# Patient Record
Sex: Female | Born: 1960
Health system: Southern US, Community
[De-identification: ages and names within clinical notes are randomized; demographics above are authoritative.]

## PROBLEM LIST (undated history)

## (undated) ENCOUNTER — Emergency Department (HOSPITAL_COMMUNITY): Admission: EM | Payer: Medicaid Other

## (undated) DIAGNOSIS — F329 Major depressive disorder, single episode, unspecified: Secondary | ICD-10-CM

## (undated) DIAGNOSIS — R011 Cardiac murmur, unspecified: Secondary | ICD-10-CM

## (undated) DIAGNOSIS — J449 Chronic obstructive pulmonary disease, unspecified: Secondary | ICD-10-CM

## (undated) DIAGNOSIS — R51 Headache: Secondary | ICD-10-CM

## (undated) DIAGNOSIS — G43909 Migraine, unspecified, not intractable, without status migrainosus: Secondary | ICD-10-CM

## (undated) DIAGNOSIS — R55 Syncope and collapse: Secondary | ICD-10-CM

## (undated) DIAGNOSIS — N200 Calculus of kidney: Secondary | ICD-10-CM

## (undated) DIAGNOSIS — R413 Other amnesia: Secondary | ICD-10-CM

## (undated) DIAGNOSIS — J069 Acute upper respiratory infection, unspecified: Secondary | ICD-10-CM

## (undated) DIAGNOSIS — U071 COVID-19: Secondary | ICD-10-CM

## (undated) DIAGNOSIS — K759 Inflammatory liver disease, unspecified: Secondary | ICD-10-CM

## (undated) DIAGNOSIS — IMO0002 Reserved for concepts with insufficient information to code with codable children: Secondary | ICD-10-CM

## (undated) DIAGNOSIS — M797 Fibromyalgia: Secondary | ICD-10-CM

## (undated) DIAGNOSIS — T8859XA Other complications of anesthesia, initial encounter: Secondary | ICD-10-CM

## (undated) DIAGNOSIS — M1990 Unspecified osteoarthritis, unspecified site: Secondary | ICD-10-CM

## (undated) DIAGNOSIS — D649 Anemia, unspecified: Secondary | ICD-10-CM

## (undated) DIAGNOSIS — K219 Gastro-esophageal reflux disease without esophagitis: Secondary | ICD-10-CM

## (undated) DIAGNOSIS — F32A Depression, unspecified: Secondary | ICD-10-CM

## (undated) DIAGNOSIS — I10 Essential (primary) hypertension: Secondary | ICD-10-CM

## (undated) DIAGNOSIS — I959 Hypotension, unspecified: Secondary | ICD-10-CM

## (undated) DIAGNOSIS — R0602 Shortness of breath: Secondary | ICD-10-CM

## (undated) DIAGNOSIS — K589 Irritable bowel syndrome without diarrhea: Secondary | ICD-10-CM

## (undated) DIAGNOSIS — G5600 Carpal tunnel syndrome, unspecified upper limb: Secondary | ICD-10-CM

## (undated) DIAGNOSIS — T4145XA Adverse effect of unspecified anesthetic, initial encounter: Secondary | ICD-10-CM

## (undated) HISTORY — DX: Other amnesia: R41.3

## (undated) HISTORY — PX: DILATION AND CURETTAGE OF UTERUS: SHX78

## (undated) HISTORY — PX: OTHER SURGICAL HISTORY: SHX169

## (undated) HISTORY — PX: CHOLECYSTECTOMY: SHX55

## (undated) HISTORY — DX: Syncope and collapse: R55

## (undated) HISTORY — PX: LIVER BIOPSY: SHX301

## (undated) HISTORY — DX: Anemia, unspecified: D64.9

## (undated) HISTORY — DX: Gastro-esophageal reflux disease without esophagitis: K21.9

## (undated) HISTORY — PX: TONSILLECTOMY: SUR1361

## (undated) HISTORY — PX: TUBAL LIGATION: SHX77

---
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
## 1998-03-22 ENCOUNTER — Emergency Department (HOSPITAL_COMMUNITY): Admission: EM | Admit: 1998-03-22 | Discharge: 1998-03-22 | Payer: Self-pay | Admitting: Emergency Medicine

## 1998-06-15 ENCOUNTER — Inpatient Hospital Stay (HOSPITAL_COMMUNITY): Admission: AD | Admit: 1998-06-15 | Discharge: 1998-06-15 | Payer: Self-pay | Admitting: Obstetrics

## 1998-07-12 ENCOUNTER — Ambulatory Visit (HOSPITAL_COMMUNITY): Admission: RE | Admit: 1998-07-12 | Discharge: 1998-07-12 | Payer: Self-pay | Admitting: Family Medicine

## 1998-07-12 ENCOUNTER — Encounter: Payer: Self-pay | Admitting: Family Medicine

## 1998-08-18 ENCOUNTER — Encounter: Payer: Self-pay | Admitting: Emergency Medicine

## 1998-08-18 ENCOUNTER — Emergency Department (HOSPITAL_COMMUNITY): Admission: EM | Admit: 1998-08-18 | Discharge: 1998-08-18 | Payer: Self-pay | Admitting: Emergency Medicine

## 1999-01-27 ENCOUNTER — Inpatient Hospital Stay (HOSPITAL_COMMUNITY): Admission: AD | Admit: 1999-01-27 | Discharge: 1999-01-28 | Payer: Self-pay | Admitting: Obstetrics

## 1999-01-27 ENCOUNTER — Encounter: Payer: Self-pay | Admitting: Emergency Medicine

## 1999-02-21 ENCOUNTER — Ambulatory Visit (HOSPITAL_COMMUNITY): Admission: RE | Admit: 1999-02-21 | Discharge: 1999-02-21 | Payer: Self-pay | Admitting: *Deleted

## 1999-02-28 ENCOUNTER — Encounter: Admission: RE | Admit: 1999-02-28 | Discharge: 1999-05-29 | Payer: Self-pay | Admitting: Obstetrics

## 1999-03-06 ENCOUNTER — Encounter (HOSPITAL_COMMUNITY): Admission: RE | Admit: 1999-03-06 | Discharge: 1999-03-21 | Payer: Self-pay | Admitting: Obstetrics

## 1999-03-06 ENCOUNTER — Encounter: Admission: RE | Admit: 1999-03-06 | Discharge: 1999-03-06 | Payer: Self-pay | Admitting: Obstetrics

## 1999-03-19 ENCOUNTER — Inpatient Hospital Stay (HOSPITAL_COMMUNITY): Admission: AD | Admit: 1999-03-19 | Discharge: 1999-03-19 | Payer: Self-pay | Admitting: *Deleted

## 1999-03-20 ENCOUNTER — Inpatient Hospital Stay (HOSPITAL_COMMUNITY): Admission: AD | Admit: 1999-03-20 | Discharge: 1999-03-22 | Payer: Self-pay | Admitting: Obstetrics & Gynecology

## 1999-03-26 ENCOUNTER — Inpatient Hospital Stay (HOSPITAL_COMMUNITY): Admission: AD | Admit: 1999-03-26 | Discharge: 1999-03-26 | Payer: Self-pay | Admitting: Obstetrics

## 1999-11-01 ENCOUNTER — Emergency Department (HOSPITAL_COMMUNITY): Admission: EM | Admit: 1999-11-01 | Discharge: 1999-11-01 | Payer: Self-pay | Admitting: Emergency Medicine

## 1999-11-01 ENCOUNTER — Encounter: Payer: Self-pay | Admitting: Emergency Medicine

## 2000-11-14 ENCOUNTER — Observation Stay (HOSPITAL_COMMUNITY): Admission: AD | Admit: 2000-11-14 | Discharge: 2000-11-15 | Payer: Self-pay | Admitting: Obstetrics & Gynecology

## 2000-11-14 ENCOUNTER — Encounter: Payer: Self-pay | Admitting: Obstetrics & Gynecology

## 2000-12-07 ENCOUNTER — Other Ambulatory Visit: Admission: RE | Admit: 2000-12-07 | Discharge: 2000-12-07 | Payer: Self-pay | Admitting: Obstetrics

## 2000-12-12 ENCOUNTER — Emergency Department (HOSPITAL_COMMUNITY): Admission: EM | Admit: 2000-12-12 | Discharge: 2000-12-13 | Payer: Self-pay | Admitting: *Deleted

## 2000-12-16 ENCOUNTER — Inpatient Hospital Stay: Admission: AD | Admit: 2000-12-16 | Discharge: 2000-12-16 | Payer: Self-pay | Admitting: Obstetrics

## 2001-01-05 ENCOUNTER — Encounter: Admission: AD | Admit: 2001-01-05 | Discharge: 2001-02-04 | Payer: Self-pay | Admitting: Obstetrics

## 2001-01-09 ENCOUNTER — Inpatient Hospital Stay (HOSPITAL_COMMUNITY): Admission: AD | Admit: 2001-01-09 | Discharge: 2001-01-09 | Payer: Self-pay | Admitting: Obstetrics

## 2001-01-20 ENCOUNTER — Encounter: Payer: Self-pay | Admitting: Obstetrics

## 2001-01-25 ENCOUNTER — Emergency Department (HOSPITAL_COMMUNITY): Admission: EM | Admit: 2001-01-25 | Discharge: 2001-01-25 | Payer: Self-pay | Admitting: Emergency Medicine

## 2001-02-04 ENCOUNTER — Inpatient Hospital Stay (HOSPITAL_COMMUNITY): Admission: AD | Admit: 2001-02-04 | Discharge: 2001-02-04 | Payer: Self-pay | Admitting: Obstetrics

## 2001-02-09 ENCOUNTER — Inpatient Hospital Stay (HOSPITAL_COMMUNITY): Admission: AD | Admit: 2001-02-09 | Discharge: 2001-02-09 | Payer: Self-pay | Admitting: Obstetrics

## 2001-02-15 ENCOUNTER — Inpatient Hospital Stay (HOSPITAL_COMMUNITY): Admission: AD | Admit: 2001-02-15 | Discharge: 2001-02-15 | Payer: Self-pay | Admitting: Obstetrics

## 2002-04-04 ENCOUNTER — Emergency Department (HOSPITAL_COMMUNITY): Admission: EM | Admit: 2002-04-04 | Discharge: 2002-04-04 | Payer: Self-pay | Admitting: Emergency Medicine

## 2002-09-28 ENCOUNTER — Ambulatory Visit (HOSPITAL_COMMUNITY): Admission: RE | Admit: 2002-09-28 | Discharge: 2002-09-28 | Payer: Self-pay | Admitting: *Deleted

## 2002-09-28 ENCOUNTER — Encounter: Payer: Self-pay | Admitting: *Deleted

## 2002-09-28 ENCOUNTER — Encounter (INDEPENDENT_AMBULATORY_CARE_PROVIDER_SITE_OTHER): Payer: Self-pay | Admitting: Specialist

## 2003-03-24 ENCOUNTER — Emergency Department (HOSPITAL_COMMUNITY): Admission: EM | Admit: 2003-03-24 | Discharge: 2003-03-24 | Payer: Self-pay | Admitting: *Deleted

## 2003-03-25 ENCOUNTER — Emergency Department (HOSPITAL_COMMUNITY): Admission: EM | Admit: 2003-03-25 | Discharge: 2003-03-25 | Payer: Self-pay | Admitting: Internal Medicine

## 2003-11-18 ENCOUNTER — Emergency Department (HOSPITAL_COMMUNITY): Admission: EM | Admit: 2003-11-18 | Discharge: 2003-11-18 | Payer: Self-pay | Admitting: Emergency Medicine

## 2004-12-04 ENCOUNTER — Ambulatory Visit: Payer: Self-pay | Admitting: Family Medicine

## 2005-01-13 ENCOUNTER — Ambulatory Visit: Payer: Self-pay | Admitting: Internal Medicine

## 2005-03-27 ENCOUNTER — Encounter (INDEPENDENT_AMBULATORY_CARE_PROVIDER_SITE_OTHER): Payer: Self-pay | Admitting: Specialist

## 2005-03-27 ENCOUNTER — Ambulatory Visit (HOSPITAL_COMMUNITY): Admission: RE | Admit: 2005-03-27 | Discharge: 2005-03-27 | Payer: Self-pay | Admitting: Gastroenterology

## 2005-04-09 ENCOUNTER — Ambulatory Visit: Payer: Self-pay | Admitting: Gastroenterology

## 2005-05-14 ENCOUNTER — Ambulatory Visit: Payer: Self-pay | Admitting: Gastroenterology

## 2005-07-14 ENCOUNTER — Encounter: Admission: RE | Admit: 2005-07-14 | Discharge: 2005-07-14 | Payer: Self-pay | Admitting: Orthopaedic Surgery

## 2005-07-15 ENCOUNTER — Ambulatory Visit: Payer: Self-pay | Admitting: Gastroenterology

## 2005-08-28 ENCOUNTER — Ambulatory Visit: Payer: Self-pay | Admitting: Gastroenterology

## 2005-10-15 ENCOUNTER — Ambulatory Visit: Payer: Self-pay | Admitting: Gastroenterology

## 2005-10-20 ENCOUNTER — Ambulatory Visit: Payer: Self-pay | Admitting: Gastroenterology

## 2005-10-20 ENCOUNTER — Encounter (INDEPENDENT_AMBULATORY_CARE_PROVIDER_SITE_OTHER): Payer: Self-pay | Admitting: *Deleted

## 2005-11-05 ENCOUNTER — Ambulatory Visit: Payer: Self-pay | Admitting: Gastroenterology

## 2005-11-06 ENCOUNTER — Ambulatory Visit: Payer: Self-pay | Admitting: Pulmonary Disease

## 2007-08-02 DIAGNOSIS — R0989 Other specified symptoms and signs involving the circulatory and respiratory systems: Secondary | ICD-10-CM | POA: Insufficient documentation

## 2007-08-02 DIAGNOSIS — B1920 Unspecified viral hepatitis C without hepatic coma: Secondary | ICD-10-CM | POA: Insufficient documentation

## 2007-08-02 DIAGNOSIS — R0609 Other forms of dyspnea: Secondary | ICD-10-CM | POA: Insufficient documentation

## 2007-08-02 DIAGNOSIS — K589 Irritable bowel syndrome without diarrhea: Secondary | ICD-10-CM | POA: Insufficient documentation

## 2008-05-29 ENCOUNTER — Other Ambulatory Visit: Payer: Self-pay

## 2008-05-30 ENCOUNTER — Ambulatory Visit: Payer: Self-pay | Admitting: *Deleted

## 2008-05-30 ENCOUNTER — Inpatient Hospital Stay (HOSPITAL_COMMUNITY): Admission: EM | Admit: 2008-05-30 | Discharge: 2008-06-03 | Payer: Self-pay | Admitting: *Deleted

## 2008-10-11 ENCOUNTER — Ambulatory Visit (HOSPITAL_COMMUNITY): Admission: RE | Admit: 2008-10-11 | Discharge: 2008-10-11 | Payer: Self-pay | Admitting: Specialist

## 2008-11-23 ENCOUNTER — Encounter: Admission: RE | Admit: 2008-11-23 | Discharge: 2008-11-23 | Payer: Self-pay | Admitting: Specialist

## 2008-12-12 ENCOUNTER — Emergency Department (HOSPITAL_COMMUNITY): Admission: EM | Admit: 2008-12-12 | Discharge: 2008-12-13 | Payer: Self-pay | Admitting: Emergency Medicine

## 2010-08-28 ENCOUNTER — Emergency Department (HOSPITAL_COMMUNITY): Admission: EM | Admit: 2010-08-28 | Discharge: 2009-10-19 | Payer: Self-pay | Admitting: Emergency Medicine

## 2010-10-12 ENCOUNTER — Encounter: Payer: Self-pay | Admitting: Internal Medicine

## 2010-10-12 ENCOUNTER — Encounter: Payer: Self-pay | Admitting: Specialist

## 2010-12-07 LAB — CBC
MCHC: 33.1 g/dL (ref 30.0–36.0)
MCV: 96.4 fL (ref 78.0–100.0)
RDW: 13.3 % (ref 11.5–15.5)

## 2010-12-07 LAB — POCT I-STAT, CHEM 8
Creatinine, Ser: 0.5 mg/dL (ref 0.4–1.2)
Glucose, Bld: 103 mg/dL — ABNORMAL HIGH (ref 70–99)
HCT: 43 % (ref 36.0–46.0)
Hemoglobin: 14.6 g/dL (ref 12.0–15.0)
Potassium: 4.2 mEq/L (ref 3.5–5.1)
Sodium: 141 mEq/L (ref 135–145)
TCO2: 28 mmol/L (ref 0–100)

## 2010-12-07 LAB — DIFFERENTIAL
Basophils Absolute: 0.1 10*3/uL (ref 0.0–0.1)
Basophils Relative: 1 % (ref 0–1)
Eosinophils Absolute: 0.1 10*3/uL (ref 0.0–0.7)
Monocytes Absolute: 0.6 10*3/uL (ref 0.1–1.0)
Monocytes Relative: 6 % (ref 3–12)
Neutro Abs: 5.8 10*3/uL (ref 1.7–7.7)
Neutrophils Relative %: 57 % (ref 43–77)

## 2011-01-01 LAB — DIFFERENTIAL
Basophils Absolute: 0 10*3/uL (ref 0.0–0.1)
Basophils Relative: 0 % (ref 0–1)
Eosinophils Absolute: 0 10*3/uL (ref 0.0–0.7)
Eosinophils Relative: 0 % (ref 0–5)
Lymphocytes Relative: 15 % (ref 12–46)
Lymphs Abs: 1.2 K/uL (ref 0.7–4.0)
Monocytes Absolute: 0.4 10*3/uL (ref 0.1–1.0)
Monocytes Relative: 5 % (ref 3–12)
Neutro Abs: 6.4 K/uL (ref 1.7–7.7)
Neutrophils Relative %: 79 % — ABNORMAL HIGH (ref 43–77)

## 2011-01-01 LAB — CBC
HCT: 48.5 % — ABNORMAL HIGH (ref 36.0–46.0)
Hemoglobin: 16.3 g/dL — ABNORMAL HIGH (ref 12.0–15.0)
MCHC: 33.5 g/dL (ref 30.0–36.0)
MCV: 95.2 fL (ref 78.0–100.0)
Platelets: 180 10*3/uL (ref 150–400)
RBC: 5.09 MIL/uL (ref 3.87–5.11)
RDW: 13.6 % (ref 11.5–15.5)
WBC: 8 K/uL (ref 4.0–10.5)

## 2011-01-01 LAB — POCT I-STAT, CHEM 8
BUN: 9 mg/dL (ref 6–23)
Calcium, Ion: 1.15 mmol/L (ref 1.12–1.32)
HCT: 50 % — ABNORMAL HIGH (ref 36.0–46.0)
Hemoglobin: 17 g/dL — ABNORMAL HIGH (ref 12.0–15.0)
Sodium: 139 mEq/L (ref 135–145)
TCO2: 24 mmol/L (ref 0–100)

## 2011-01-01 LAB — GLUCOSE, CAPILLARY

## 2011-02-03 NOTE — H&P (Signed)
NAME:  Susan Hogan, ENDRES NO.:  [id number]   MEDICAL RECORD NO.:  [id number]          PATIENT TYPE:  IPS   LOCATION:  0302                          FACILITY:  [facility]   PHYSICIAN:  Jasmine Pang, M.D. DATE OF BIRTH:  [birth year]   DATE OF ADMISSION:  05/30/2008  DATE OF DISCHARGE:                       PSYCHIATRIC ADMISSION ASSESSMENT   This is a 50 year old female voluntarily admitted on May 30, 2008.   HISTORY OF PRESENT ILLNESS:  The patient is here for depression stating  that she has been getting really, really depressed.  Stating that a  lot things are going on in her life.  She has been dealing with her  health situation, financial problems, dependent on the children's  father, having conflict with her mother.  She apparently was kicked out  of her mother's house, felt very desperate and cut her left wrist.  The  patient was then assessed at the emergency department for further  assessment in our facility   PAST PSYCHIATRIC HISTORY:  First admission to [facility].  Was a past client of [facility].   SOCIAL HISTORY:  She is a single female in a relationship with a  boyfriend and lives in [place].  The patient is unemployed.   FAMILY HISTORY:  None.   ALCOHOL/DRUG HISTORY:  The patient drinks alcohol on special occasions.  Denies any drug use.   PRIMARY CARE [place] Ginley:  Dr. Mayford Knife who is at the [facility] on [facility].   MEDICAL PROBLEMS:  1. Degenerative disk disease.  2. Non-insulin-dependent diabetes mellitus.   MEDICATIONS:  1. Metformin, unclear of the dose.  2. Hydrocodone 5/500, unclear of prescriber.  3. Zoloft, ran out.  4. Paxil, was taking it in August.   DRUG ALLERGIES:  PENICILLIN.   PHYSICAL EXAMINATION:  GENERAL:  This is an overweight middle-aged  female who was assessed at [facility] Emergency Department.  She does  have a superficial laceration to her left wrist and  did not require  suturing.  VITAL SIGNS:  Temperature 98.2, 78 heart rate, 18 respirations, blood  pressure is 129/90, 97% saturated.   LABORATORY DATA:  Urine drug screen is negative.  Urine pregnancy test  is negative.  Alcohol level less than 5.  WBC count is 12.2.  Urinalysis  shows 0-2 RBCs.   MENTAL STATUS EXAM:  She is fully alert, cooperative.  She is currently  dressed in hospital scrubs.  She has fair eye contact.  Her speech is  clear, normal pace and tone.  The patient's mood is feeling very useless  and worthless.  The patient's affect is sad, anxious and depressed.  Thought processes are coherent.  No evidence of any psychotic symptoms.  No delusional statements.  Cognitive function intact.  Memory is good.  Judgment and insight is fair.   AXIS I:  Depressive disorder, not otherwise specified.  AXIS II:  Deferred.  AXIS III:  Status post superficial laceration, degenerative disk  disease, non-insulin-dependent diabetes mellitus.  AXIS IV:  Problems with occupation, legal system, other medical  problems.  Possible problems with a  primary support group, mother and  boyfriend.  AXIS V:  Current is 35.   PLAN:  Contract for safety.  We will clarify her medications.  We will  contact her doctor for current medication list.  We will monitor wound  for signs and symptoms of infection.  We will have a family session with  the patient's support group.  The patient will be in the [facility].  We  will also initiate Celexa.  Risks and benefits were discussed.  The  patient is agreeable to beginning medication.  Case manager will obtain  her followup.  Tentative length of stay is 3-5 days.      Landry Corporal, N.P.      Jasmine Pang, M.D.  Electronically Signed    JO/MEDQ  D:  05/31/2008  T:  05/31/2008  Job:  [id number]

## 2011-02-03 NOTE — Discharge Summary (Signed)
NAME:  Susan Hogan, NOTO NO.:  [id number]   MEDICAL RECORD NO.:  [id number]          PATIENT TYPE:  IPS   LOCATION:  0302                          FACILITY:  [facility]   PHYSICIAN:  Jasmine Pang, M.D. DATE OF BIRTH:  12-Nov-1960   DATE OF ADMISSION:  05/30/2008  DATE OF DISCHARGE:  06/03/2008                               DISCHARGE SUMMARY   IDENTIFICATION:  This is a 50 year old married Latino female who was  admitted on a voluntary basis on May 30, 2008.   HISTORY OF PRESENT ILLNESS:  The patient is here for depression, stating  that she has been getting really depressed.  She states a lot of  things are going on in her life.  She has been dealing with her health  situation, financial problems, and dependency on her children's father.  She is having conflict with her mother as well.  She was apparently  kicked out of her mother's house and felt very desperate and cut her  left wrist.  The patient was then assessed at the emergency department  for further and sent to [place] for further evaluation in our facility.   PAST PSYCHIATRIC HISTORY:  This is the first admission to [facility].  The patient was client of [facility].  The patient has been at [facility] in the past.  She has been on  Wellbutrin in the past.  She states this did not help.  She was on  Celexa until 1 month ago.   FAMILY HISTORY:  None.   ALCOHOL AND DRUG HISTORY:  The patient drinks alcohol on special  occasions.  She denies any drug use.   MEDICAL PROBLEMS:  Degenerative disk disease and non-insulin-dependent  diabetes mellitus.   MEDICATIONS:  1. Metformin 500 mg. p.o. b.i.d.  2. Hydrocodone 5 mg/500 mg, unclear if prescribed overdose.  3. Zoloft, which is run out.  4. Paxil was taking in August, but not compliant.   PHYSICAL FINDINGS:  There were no acute physical or medical problems  noted.  She was assessed at the [facility] Emergency  Department.   LABORATORY DATA:  Urine drug screen was negative.  Urine pregnancy test  was negative.  Alcohol level was less than 5.  WBC count was 12.2.  Urinalysis shows 0 to 2 RBCs.   HOSPITAL COURSE:  Upon admission, the patient was restarted on the  metformin 500 mg p.o. b.i.d. She was started on Ambien 10 mg p.o.  q.h.s., Vistaril 25 mg q.4 hours p.r.n. anxiety, and Motrin 400 mg p.o.  q. 6 hours p.r.n. pain.  The patient tolerated these medications well  with no significant side effects.  In individual sessions with me, the  patient was friendly and cooperative.  She also participated  appropriately in unit therapeutic groups and activities.  Therapeutic  issues revolved around her stressors.  She has been unable to work.  She  has had health problems.  She has had financial problems and has been  dependent on her children's father for money.  She has a very bad  relationship with him.  She moved in with her mother and began to have  conflict there and had to move out.  Her brother has been missing since  2004.  She states she felt desperate and cut her wrist.  She states she  wanted to die.  On May 31, 2008, the patient was still depressed,  still anxious.  She wanted a family session with her husband, though she  did not feel he was going to be supportive.  On June 01, 2008, she  was somewhat less depressed, less anxious.  She was having some sedation  from her medications, otherwise no side effects.  On June 02, 2008,  the patient was feeling less depressed and less anxious.  She was  anxious about her family session.  Her husband came in.  There was a lot  of conflict.  The patient was physically agitated with the interpreter  for her husband translated the boyfriend's responses and brought up  various disagreements that she and he had.  The patient agreed to seek  out individual therapy.  Husband stated that he did not need counseling  and was not willing to  go to get marital therapy.  The patient states  she no longer felt suicidal and husband felt safe with her coming home.  He stated that he would be a more on financial support to the patient.  On June 03, 2008, Mood was still less depressed, less anxious.  Affect consistent with mood.  There was no suicidal or homicidal  ideation.  No thoughts of self-injurious behavior.  No auditory or  visual hallucinations.  No paranoia or delusions.  Thoughts were logical  and goal-directed.  Thought content no predominant theme.  Cognitive was  grossly intact.  Insight good.  Judgment good.  Impulse control was  good.  It was felt the patient was safe for discharge.   DISCHARGE PLANS:  There was no specific activity level restrictions.  The patient's dietary restrictions are to be recommended by her primary  care physician for her diabetes.   POSTHOSPITAL CARE PLANS:  [facility] with Danice Goltz on  September 14th at 11 a.m.  She will receive a counselor and a  psychiatrist there.   DISCHARGE MEDICATIONS:  1. Glucophage 500 mg twice daily.  2. Celexa 20 mg daily.  3. Ambien 10 mg at bedtime.  4. Continue hydrocodone as prescribed by Dr. Mayford Knife.      Jasmine Pang, M.D.  Electronically Signed     BHS/MEDQ  D:  06/03/2008  T:  06/03/2008  Job:  [id number]

## 2011-02-06 NOTE — Discharge Summary (Signed)
[facility]  Patient:    Susan Hogan, Susan Hogan                       MRN: [id number] Adm. Date:  [id number] Disc. Date: [id number] Attending:  Antionette Char Dictator:   Zella Ball, M.D.                           Discharge Summary  DATE OF BIRTH:                23-Nov-1960  DISCHARGE DIAGNOSES:          1. Intrauterine pregnancy at 24 weeks by                                  ultrasound (22 weeks 6 days by last menstrual                                  period and 24 weeks 0 days by ultrasound of                                  November 14, 2000).                               2. No prenatal care.  HISTORY:                      This is a 50 year old G7, P5-1-0-6 who presents at estimated gestational age of actually 22 weeks 4 days by LMP who three days prior to presentation had some mild midline cramping increased when sitting up and then had two episodes over the last week where she felt she lost some clear fluid.  She came in to have all this checked out.  Patient only realized she was pregnant last week.  Apparently, had been on Depo-Provera and was unaware of the pregnancy up until this time.  She came in secondary to her concern that her fluid might be ruptured secondary to ______ last week.  MEDICATIONS:                  Alka-Seltzer p.r.n.  ALLERGIES:                    PENICILLIN.  PAST OBSTETRICAL HISTORY:     Patients last child born in 2000 patient states was one month premature secondary to gestational diabetes.  Prior to that she had had five normal spontaneous vaginal deliveries at term.  PAST MEDICAL HISTORY:         Asthma and hypertension.  SOCIAL HISTORY:               Patient denies tobacco, alcohol, and drug use.  PHYSICAL EXAMINATION  VITAL SIGNS:                  Temperature 98.6, pulse 91, respiratory rate 22, blood pressure 109/47.  GENERAL:                      Obese.  No acute distress.  HEART:  Regular rate and rhythm with 2/6 systolic murmur.  LUNGS:                        Clear to auscultation bilaterally.  ABDOMEN:                      Obese, gravid.  Fundal height unclear measurement.  PELVIC:                       Digital cervical examination:  Fingertip, high, and thick.  Speculum examination showed a thin white discharge.  No vaginal pooling.  Patient was fern negative and nitrazine negative.  LABORATORIES:                 OB ultrasound was performed which showed a 24 week 0 day estimated gestational age with a fluid pocket of 5.9 cm, grade 1 placenta which was posterior, and a single intrauterine fetus.                                GC and chlamydia were taken and were pending. Capillary glucose of 106.  ASSESSMENT:                   There was some confusion about the patients AFI.  It was initially interpreted that the fluid pocket was the patients AFI so she was admitted for oligohydramnios.  HOSPITAL COURSE:              After reevaluating the ultrasound it was found that the patient indeed did not have oligohydramnios, had normal amniotic fluid, therefore patient was discharged to home the next day.  Followup [facility] for Wednesday[facility]  DISCHARGE MEDICATIONS:        Prenatal vitamins. DD:  11/15/00 TD:  11/15/00 Job: [id number]

## 2011-03-25 ENCOUNTER — Emergency Department (HOSPITAL_COMMUNITY)
Admission: EM | Admit: 2011-03-25 | Discharge: 2011-03-26 | Disposition: A | Discharge status: Home / Self Care | Payer: No Typology Code available for payment source | Attending: Emergency Medicine | Admitting: Emergency Medicine

## 2011-03-25 ENCOUNTER — Emergency Department (HOSPITAL_COMMUNITY): Payer: No Typology Code available for payment source

## 2011-03-25 DIAGNOSIS — K589 Irritable bowel syndrome without diarrhea: Secondary | ICD-10-CM | POA: Insufficient documentation

## 2011-03-25 DIAGNOSIS — M25559 Pain in unspecified hip: Secondary | ICD-10-CM | POA: Insufficient documentation

## 2011-03-25 DIAGNOSIS — R51 Headache: Secondary | ICD-10-CM | POA: Insufficient documentation

## 2011-03-25 DIAGNOSIS — S139XXA Sprain of joints and ligaments of unspecified parts of neck, initial encounter: Secondary | ICD-10-CM | POA: Insufficient documentation

## 2011-03-25 DIAGNOSIS — B1920 Unspecified viral hepatitis C without hepatic coma: Secondary | ICD-10-CM | POA: Insufficient documentation

## 2011-03-25 DIAGNOSIS — M546 Pain in thoracic spine: Secondary | ICD-10-CM | POA: Insufficient documentation

## 2011-03-25 DIAGNOSIS — E119 Type 2 diabetes mellitus without complications: Secondary | ICD-10-CM | POA: Insufficient documentation

## 2011-03-25 DIAGNOSIS — M542 Cervicalgia: Secondary | ICD-10-CM | POA: Insufficient documentation

## 2011-03-25 DIAGNOSIS — M069 Rheumatoid arthritis, unspecified: Secondary | ICD-10-CM | POA: Insufficient documentation

## 2011-03-25 DIAGNOSIS — S335XXA Sprain of ligaments of lumbar spine, initial encounter: Secondary | ICD-10-CM | POA: Insufficient documentation

## 2011-03-25 DIAGNOSIS — M25569 Pain in unspecified knee: Secondary | ICD-10-CM | POA: Insufficient documentation

## 2011-04-07 ENCOUNTER — Other Ambulatory Visit: Payer: Self-pay | Admitting: Specialist

## 2011-04-07 DIAGNOSIS — R51 Headache: Secondary | ICD-10-CM

## 2011-04-08 ENCOUNTER — Other Ambulatory Visit: Payer: No Typology Code available for payment source

## 2011-04-10 ENCOUNTER — Ambulatory Visit
Admission: RE | Admit: 2011-04-10 | Discharge: 2011-04-10 | Disposition: A | Discharge status: Home / Self Care | Payer: No Typology Code available for payment source | Source: Ambulatory Visit | Attending: Specialist | Admitting: Specialist

## 2011-04-10 DIAGNOSIS — R51 Headache: Secondary | ICD-10-CM

## 2011-06-24 LAB — RAPID URINE DRUG SCREEN, HOSP PERFORMED
Amphetamines: NOT DETECTED
Barbiturates: NOT DETECTED
Cocaine: NOT DETECTED
Opiates: NOT DETECTED

## 2011-06-24 LAB — DIFFERENTIAL
Basophils Relative: 2 — ABNORMAL HIGH
Eosinophils Absolute: 0.1
Lymphs Abs: 3.1
Neutro Abs: 8 — ABNORMAL HIGH
Neutrophils Relative %: 66

## 2011-06-24 LAB — URINALYSIS, ROUTINE W REFLEX MICROSCOPIC
Nitrite: NEGATIVE
Protein, ur: NEGATIVE
Specific Gravity, Urine: 1.021
Urobilinogen, UA: 0.2

## 2011-06-24 LAB — CBC
MCV: 95.8
Platelets: 227
RBC: 4.52
WBC: 12.2 — ABNORMAL HIGH

## 2011-06-24 LAB — GLUCOSE, CAPILLARY
Glucose-Capillary: 100 — ABNORMAL HIGH
Glucose-Capillary: 102 — ABNORMAL HIGH
Glucose-Capillary: 114 — ABNORMAL HIGH
Glucose-Capillary: 94
Glucose-Capillary: 98

## 2011-06-24 LAB — BASIC METABOLIC PANEL
BUN: 11
Calcium: 9.5
Chloride: 104
Creatinine, Ser: 0.69
GFR calc Af Amer: >60

## 2011-06-24 LAB — URINE MICROSCOPIC-ADD ON

## 2011-06-24 LAB — ETHANOL: Alcohol, Ethyl (B): <5

## 2011-07-08 ENCOUNTER — Ambulatory Visit: Payer: Medicaid Other | Admitting: Physical Therapy

## 2011-07-10 ENCOUNTER — Ambulatory Visit: Payer: Medicare Other | Attending: Specialist | Admitting: Physical Therapy

## 2011-07-10 DIAGNOSIS — M2560 Stiffness of unspecified joint, not elsewhere classified: Secondary | ICD-10-CM | POA: Insufficient documentation

## 2011-07-10 DIAGNOSIS — IMO0001 Reserved for inherently not codable concepts without codable children: Secondary | ICD-10-CM | POA: Insufficient documentation

## 2011-07-10 DIAGNOSIS — M2550 Pain in unspecified joint: Secondary | ICD-10-CM | POA: Insufficient documentation

## 2011-07-21 ENCOUNTER — Ambulatory Visit: Payer: Medicare Other | Admitting: Rehabilitation

## 2011-07-29 ENCOUNTER — Ambulatory Visit: Payer: Medicare Other | Attending: Specialist | Admitting: Physical Therapy

## 2011-07-29 DIAGNOSIS — M2560 Stiffness of unspecified joint, not elsewhere classified: Secondary | ICD-10-CM | POA: Insufficient documentation

## 2011-07-29 DIAGNOSIS — IMO0001 Reserved for inherently not codable concepts without codable children: Secondary | ICD-10-CM | POA: Insufficient documentation

## 2011-07-29 DIAGNOSIS — M2550 Pain in unspecified joint: Secondary | ICD-10-CM | POA: Insufficient documentation

## 2011-07-30 ENCOUNTER — Encounter: Payer: Medicare Other | Admitting: Physical Therapy

## 2011-08-03 ENCOUNTER — Encounter: Payer: Medicare Other | Admitting: Rehabilitation

## 2011-08-04 ENCOUNTER — Encounter: Payer: Medicare Other | Admitting: Physical Therapy

## 2011-08-06 ENCOUNTER — Ambulatory Visit: Payer: Medicare Other | Admitting: Physical Therapy

## 2011-08-10 ENCOUNTER — Ambulatory Visit: Payer: Medicare Other | Admitting: Physical Therapy

## 2011-08-12 ENCOUNTER — Ambulatory Visit: Payer: Medicare Other | Admitting: Physical Therapy

## 2011-09-08 ENCOUNTER — Other Ambulatory Visit: Payer: Self-pay | Admitting: Specialist

## 2011-09-08 DIAGNOSIS — R51 Headache: Secondary | ICD-10-CM

## 2011-09-16 ENCOUNTER — Other Ambulatory Visit: Payer: Medicare Other

## 2011-09-18 ENCOUNTER — Other Ambulatory Visit: Payer: Medicare Other

## 2011-09-18 ENCOUNTER — Other Ambulatory Visit: Payer: Self-pay | Admitting: Orthopedic Surgery

## 2011-09-18 DIAGNOSIS — M25562 Pain in left knee: Secondary | ICD-10-CM

## 2011-09-20 ENCOUNTER — Inpatient Hospital Stay: Admission: RE | Admit: 2011-09-20 | Payer: Medicare Other | Source: Ambulatory Visit

## 2011-09-21 ENCOUNTER — Other Ambulatory Visit: Payer: Medicare Other

## 2011-09-25 ENCOUNTER — Ambulatory Visit
Admission: RE | Admit: 2011-09-25 | Discharge: 2011-09-25 | Disposition: A | Discharge status: Home / Self Care | Payer: Medicare Other | Source: Ambulatory Visit | Attending: Orthopedic Surgery | Admitting: Orthopedic Surgery

## 2011-09-25 DIAGNOSIS — IMO0002 Reserved for concepts with insufficient information to code with codable children: Secondary | ICD-10-CM | POA: Diagnosis not present

## 2011-09-25 DIAGNOSIS — M2240 Chondromalacia patellae, unspecified knee: Secondary | ICD-10-CM | POA: Diagnosis not present

## 2011-09-25 DIAGNOSIS — M1710 Unilateral primary osteoarthritis, unspecified knee: Secondary | ICD-10-CM | POA: Diagnosis not present

## 2011-09-25 DIAGNOSIS — M25562 Pain in left knee: Secondary | ICD-10-CM

## 2011-09-25 DIAGNOSIS — M25569 Pain in unspecified knee: Secondary | ICD-10-CM | POA: Diagnosis not present

## 2011-10-01 DIAGNOSIS — E119 Type 2 diabetes mellitus without complications: Secondary | ICD-10-CM | POA: Diagnosis not present

## 2011-10-06 DIAGNOSIS — IMO0002 Reserved for concepts with insufficient information to code with codable children: Secondary | ICD-10-CM | POA: Diagnosis not present

## 2011-10-07 DIAGNOSIS — G609 Hereditary and idiopathic neuropathy, unspecified: Secondary | ICD-10-CM | POA: Diagnosis not present

## 2011-10-07 DIAGNOSIS — E1142 Type 2 diabetes mellitus with diabetic polyneuropathy: Secondary | ICD-10-CM | POA: Diagnosis not present

## 2011-10-08 DIAGNOSIS — E1142 Type 2 diabetes mellitus with diabetic polyneuropathy: Secondary | ICD-10-CM | POA: Diagnosis not present

## 2011-10-08 DIAGNOSIS — G609 Hereditary and idiopathic neuropathy, unspecified: Secondary | ICD-10-CM | POA: Diagnosis not present

## 2011-10-13 DIAGNOSIS — M542 Cervicalgia: Secondary | ICD-10-CM | POA: Diagnosis not present

## 2011-10-13 DIAGNOSIS — G518 Other disorders of facial nerve: Secondary | ICD-10-CM | POA: Diagnosis not present

## 2011-10-13 DIAGNOSIS — IMO0001 Reserved for inherently not codable concepts without codable children: Secondary | ICD-10-CM | POA: Diagnosis not present

## 2011-10-13 DIAGNOSIS — R51 Headache: Secondary | ICD-10-CM | POA: Diagnosis not present

## 2011-10-15 ENCOUNTER — Ambulatory Visit: Payer: Medicare Other | Admitting: Gastroenterology

## 2011-10-27 DIAGNOSIS — R51 Headache: Secondary | ICD-10-CM | POA: Diagnosis not present

## 2011-10-27 DIAGNOSIS — R5381 Other malaise: Secondary | ICD-10-CM | POA: Diagnosis not present

## 2011-10-27 DIAGNOSIS — N390 Urinary tract infection, site not specified: Secondary | ICD-10-CM | POA: Diagnosis not present

## 2011-10-27 DIAGNOSIS — M542 Cervicalgia: Secondary | ICD-10-CM | POA: Diagnosis not present

## 2011-10-27 DIAGNOSIS — IMO0001 Reserved for inherently not codable concepts without codable children: Secondary | ICD-10-CM | POA: Diagnosis not present

## 2011-10-27 DIAGNOSIS — K219 Gastro-esophageal reflux disease without esophagitis: Secondary | ICD-10-CM | POA: Diagnosis not present

## 2011-10-27 DIAGNOSIS — E119 Type 2 diabetes mellitus without complications: Secondary | ICD-10-CM | POA: Diagnosis not present

## 2011-10-27 DIAGNOSIS — G518 Other disorders of facial nerve: Secondary | ICD-10-CM | POA: Diagnosis not present

## 2011-11-10 DIAGNOSIS — M542 Cervicalgia: Secondary | ICD-10-CM | POA: Diagnosis not present

## 2011-11-10 DIAGNOSIS — IMO0001 Reserved for inherently not codable concepts without codable children: Secondary | ICD-10-CM | POA: Diagnosis not present

## 2011-11-10 DIAGNOSIS — G518 Other disorders of facial nerve: Secondary | ICD-10-CM | POA: Diagnosis not present

## 2011-11-10 DIAGNOSIS — R51 Headache: Secondary | ICD-10-CM | POA: Diagnosis not present

## 2011-11-17 ENCOUNTER — Encounter (HOSPITAL_COMMUNITY): Payer: Self-pay | Admitting: *Deleted

## 2011-11-17 MED ORDER — VANCOMYCIN HCL IN DEXTROSE 1-5 GM/200ML-% IV SOLN: 1000.0000 mg | INTRAVENOUS | Status: DC

## 2011-11-17 NOTE — H&P (Signed)
CC: left knee pain HPI: 51 y/o female with worsening left knee pain secondary to meniscal tear. Pt elected to have left knee scope by Dr. Ranell Patrick to relieve pain and increase function PMH: congestive heart failure, hepatitis C, hypertension, diabetes, depression, anxiety,  Social: drinks weekly, non smoker Allergies: penicillin Meds: metformin, lyrica, advil, omeprazole ROS: pain with ambulation, pain to left knee PE: alert and appropriate 51 y/o female in no acute distress Alert and oriented x 3 Lumbar spine: mild PSIS tenderness to left side but no active muscle spasm Left knee: moderate medial joint line tenderness, stable ligaments x 4 nv intact, no rashes, minimal pedal edema, mildly antalgic gait MRI: medial meniscal tear with tricompartmental osteorarthritis Assessment: left knee pain with medial meniscal tear Plan: left knee scope to relieve pain

## 2011-11-18 ENCOUNTER — Encounter (HOSPITAL_COMMUNITY): Admission: RE | Payer: Self-pay | Source: Ambulatory Visit

## 2011-11-18 ENCOUNTER — Encounter (HOSPITAL_COMMUNITY): Payer: Self-pay | Admitting: Anesthesiology

## 2011-11-18 ENCOUNTER — Ambulatory Visit (HOSPITAL_COMMUNITY): Admission: RE | Admit: 2011-11-18 | Payer: Medicare Other | Source: Ambulatory Visit | Admitting: Orthopedic Surgery

## 2011-11-18 HISTORY — DX: Acute upper respiratory infection, unspecified: J06.9

## 2011-11-18 HISTORY — DX: Headache: R51

## 2011-11-18 HISTORY — DX: Unspecified osteoarthritis, unspecified site: M19.90

## 2011-11-18 HISTORY — DX: Cardiac murmur, unspecified: R01.1

## 2011-11-18 HISTORY — DX: Fibromyalgia: M79.7

## 2011-11-18 HISTORY — DX: Depression, unspecified: F32.A

## 2011-11-18 HISTORY — DX: Shortness of breath: R06.02

## 2011-11-18 HISTORY — DX: Other complications of anesthesia, initial encounter: T88.59XA

## 2011-11-18 HISTORY — DX: Adverse effect of unspecified anesthetic, initial encounter: T41.45XA

## 2011-11-18 HISTORY — DX: Essential (primary) hypertension: I10

## 2011-11-18 HISTORY — DX: Major depressive disorder, single episode, unspecified: F32.9

## 2011-11-18 SURGERY — ARTHROSCOPY, KNEE
Anesthesia: Choice | Laterality: Left

## 2011-11-24 DIAGNOSIS — M542 Cervicalgia: Secondary | ICD-10-CM | POA: Diagnosis not present

## 2011-11-24 DIAGNOSIS — R112 Nausea with vomiting, unspecified: Secondary | ICD-10-CM | POA: Diagnosis not present

## 2011-11-24 DIAGNOSIS — N200 Calculus of kidney: Secondary | ICD-10-CM | POA: Diagnosis not present

## 2011-11-24 DIAGNOSIS — G518 Other disorders of facial nerve: Secondary | ICD-10-CM | POA: Diagnosis not present

## 2011-11-24 DIAGNOSIS — M549 Dorsalgia, unspecified: Secondary | ICD-10-CM | POA: Diagnosis not present

## 2011-11-24 DIAGNOSIS — IMO0001 Reserved for inherently not codable concepts without codable children: Secondary | ICD-10-CM | POA: Diagnosis not present

## 2011-11-24 DIAGNOSIS — R5383 Other fatigue: Secondary | ICD-10-CM | POA: Diagnosis not present

## 2011-11-24 DIAGNOSIS — R5381 Other malaise: Secondary | ICD-10-CM | POA: Diagnosis not present

## 2011-11-24 DIAGNOSIS — R51 Headache: Secondary | ICD-10-CM | POA: Diagnosis not present

## 2011-11-25 ENCOUNTER — Encounter (HOSPITAL_COMMUNITY): Payer: Self-pay

## 2011-11-25 ENCOUNTER — Encounter (HOSPITAL_COMMUNITY): Payer: Self-pay | Admitting: Pharmacy Technician

## 2011-11-26 ENCOUNTER — Inpatient Hospital Stay (HOSPITAL_COMMUNITY): Admission: RE | Admit: 2011-11-26 | Discharge: 2011-11-26 | Payer: Medicare Other | Source: Ambulatory Visit

## 2011-11-26 NOTE — Progress Notes (Signed)
15:30 PM   11/26/2011    i HAD CALLED AND SPOKE TO THE PA  FOR  DR NORRIS...CONCERNING THE TAKING OF PHENTERMINE AND ANESTHESIA....Marland KitchenMEMO SHEET STATES MEDICINE NEEDS TO BE STOPPED 2 WEEKS PRIOR TO SURGERY OR "CARDIAC ARREST MAY RESULT WITH ANESTHESIA" .Marland Kitchen    SHE WAS ORIGINALLY FOR SURGERY TOMORROW 11/27/2011, BUT BRAD CALLED THE PATIENT, TOLD HER TO STOP TAKING IT FOR 2 WEEKS AND HAS RESCHEDULED HER FOR THE 22ND... SHE OVERSLEPT FOR HER 3:00PM  APPT  TODAY....AND WILL COME TOMORROW AT 10:00AM

## 2011-11-27 ENCOUNTER — Encounter (HOSPITAL_COMMUNITY)
Admission: RE | Admit: 2011-11-27 | Discharge: 2011-11-27 | Disposition: A | Discharge status: Home / Self Care | Payer: Medicare Other | Source: Ambulatory Visit | Attending: Anesthesiology | Admitting: Anesthesiology

## 2011-11-27 ENCOUNTER — Other Ambulatory Visit: Payer: Self-pay

## 2011-11-27 ENCOUNTER — Encounter (HOSPITAL_COMMUNITY): Payer: Self-pay

## 2011-11-27 ENCOUNTER — Encounter (HOSPITAL_COMMUNITY)
Admission: RE | Admit: 2011-11-27 | Discharge: 2011-11-27 | Disposition: A | Discharge status: Home / Self Care | Payer: Medicare Other | Source: Ambulatory Visit | Attending: Orthopedic Surgery | Admitting: Orthopedic Surgery

## 2011-11-27 DIAGNOSIS — Z01811 Encounter for preprocedural respiratory examination: Secondary | ICD-10-CM | POA: Diagnosis not present

## 2011-11-27 DIAGNOSIS — Z01818 Encounter for other preprocedural examination: Secondary | ICD-10-CM | POA: Insufficient documentation

## 2011-11-27 DIAGNOSIS — I10 Essential (primary) hypertension: Secondary | ICD-10-CM | POA: Insufficient documentation

## 2011-11-27 HISTORY — DX: Inflammatory liver disease, unspecified: K75.9

## 2011-11-27 HISTORY — DX: Hypotension, unspecified: I95.9

## 2011-11-27 HISTORY — DX: Calculus of kidney: N20.0

## 2011-11-27 LAB — COMPREHENSIVE METABOLIC PANEL
ALT: 84 U/L — ABNORMAL HIGH (ref 0–35)
CO2: 31 mEq/L (ref 19–32)
Calcium: 9.6 mg/dL (ref 8.4–10.5)
Creatinine, Ser: 0.7 mg/dL (ref 0.50–1.10)
GFR calc Af Amer: >90 mL/min (ref >90)
GFR calc non Af Amer: >90 mL/min (ref >90)
Glucose, Bld: 90 mg/dL (ref 70–99)
Sodium: 144 mEq/L (ref 135–145)
Total Protein: 6.5 g/dL (ref 6.0–8.3)

## 2011-11-27 LAB — CBC
HCT: 42.2 % (ref 36.0–46.0)
Hemoglobin: 14.7 g/dL (ref 12.0–15.0)
MCH: 33.3 pg (ref 26.0–34.0)
RBC: 4.42 MIL/uL (ref 3.87–5.11)

## 2011-11-27 LAB — TYPE AND SCREEN
ABO/RH(D): O POS
Antibody Screen: NEGATIVE

## 2011-11-27 LAB — PROTIME-INR: Prothrombin Time: 13.4 seconds (ref 11.6–15.2)

## 2011-11-27 NOTE — Pre-Procedure Instructions (Signed)
20 Javonna Galiano  11/27/2011   Your procedure is scheduled on:  Friday December 11, 2011  Report to [facility] at 12:30.  Call this number if you have problems the morning of surgery: [phone]   Remember:   Do not eat food:After Midnight.  May have clear liquids: up to 4 Hours before arrival. (up to 8:30am)  Clear liquids include soda, tea, black coffee, apple or grape juice, broth.  Take these medicines the morning of surgery with A SIP OF WATER: zyrtec, advair, omeprazole,   Do not wear jewelry, make-up or nail polish.  Do not wear lotions, powders, or perfumes. You may wear deodorant.  Do not shave 48 hours prior to surgery.  Do not bring valuables to the hospital.  Contacts, dentures or bridgework may not be worn into surgery.  Leave suitcase in the car. After surgery it may be brought to your room.  For patients admitted to the hospital, checkout time is 11:00 AM the day of discharge.   Patients discharged the day of surgery will not be allowed to drive home.  Name and phone number of your driver: Lavonia Drafts unsure of cell phone #  Special Instructions: Incentive Spirometry - Practice and bring it with you on the day of surgery. and CHG Shower Use Special Wash: 1/2 bottle night before surgery and 1/2 bottle morning of surgery.   Please read over the following fact sheets that you were given: Pain Booklet, Coughing and Deep Breathing, Blood Transfusion Information, Total Joint Packet, MRSA Information and Surgical Site Infection Prevention

## 2011-11-27 NOTE — Progress Notes (Signed)
Revonda Standard request PT and PTT to be done. Patient to have anesthesia consult.

## 2011-11-29 NOTE — Consult Note (Addendum)
Anesthesia:  Patient is a 51 year old female scheduled for a left knee arthroscopy on 12/11/11.  Her history includes Hep C, fibromyalgia, depression, DM2 (AIC 5.7 05/2011), headaches, IBS, kidney stones, HTN, heart murmur, non-smoker.   She also reports a non-specific Anesthesia reaction that occurred after a D&C by Dr. Gaynell Face > 10 years ago and also after her liver biopsy on 09/28/02.  She says that shortly after each procedure she felt tightness along her rib cab (not her airway) extending to back which affected her breathing.  She said Dr. Gaynell Face injected her with an unknown medication which helped her.  It appears she was only given fluids (not medication) after the liver biopsy, which she feels contributed to her having to suffer for an extended length of time.  According to progress notes, patient "c/o inability to breath due to pain."  She did not require post procedure intubation in either situation.  I have not yet been able to obtain records from her [facility], but she was discharged home within fours hours of reaching recovery after her liver biopsy. (See attached records.)  Of note, patient takes phentermine for weight loss, which she just stopped in anticipation of surgery.    In regards to her Hepatitis C, she was diagnosed in 2004.  She believes she was infected by contaminated equipment used when she got a tattoo. She initially saw a Gastroenterologist (?Dr. Arlyce Dice), but has since been followed only be her PCP Dr. Lerry Liner.  She is scheduled to be seen again in a Hepatitis Clinic in May.   Labs done today show an elevated AST of 58 and ALT of 84.  Her coags and platelets are WNL.  I will see if there are any comparison labs at Dr. Vilinda Blanks office to ensure stability of her transaminases.  CXR and EKG were WNL.  Addendum:  12/02/11 1600  I have spoken with [facility] and [facility] medical records department and neither can find any records readily available from her D&C procedure by  Dr. Gaynell Face.  I did receive comparison labs from her PCP done on 05/28/11.  AST was 98 and ALT 117 then.  Overall LFTs appear stable.  With normal PLTs and coags, feel she would be okay to proceed.  Anesthesiologist Dr. Noreene Larsson agrees.

## 2011-11-29 NOTE — Anesthesia Preprocedure Evaluation (Signed)
Anesthesia Evaluation  General Assessment Comment:See Anesthesia Consults re: Anesthesia reaction  Airway       Dental   Pulmonary          Cardiovascular     Neuro/Psych    GI/Hepatic   Endo/Other    Renal/GU      Musculoskeletal   Abdominal   Peds  Hematology   Anesthesia Other Findings   Reproductive/Obstetrics                           Anesthesia Physical Anesthesia Plan Anesthesia Quick Evaluation

## 2011-11-30 LAB — ABO/RH: ABO/RH(D): O POS

## 2011-12-07 DIAGNOSIS — R319 Hematuria, unspecified: Secondary | ICD-10-CM | POA: Diagnosis not present

## 2011-12-07 DIAGNOSIS — N200 Calculus of kidney: Secondary | ICD-10-CM | POA: Diagnosis not present

## 2011-12-07 DIAGNOSIS — M549 Dorsalgia, unspecified: Secondary | ICD-10-CM | POA: Diagnosis not present

## 2011-12-07 DIAGNOSIS — R112 Nausea with vomiting, unspecified: Secondary | ICD-10-CM | POA: Diagnosis not present

## 2011-12-11 ENCOUNTER — Encounter (HOSPITAL_COMMUNITY): Payer: Self-pay | Admitting: Anesthesiology

## 2011-12-11 ENCOUNTER — Encounter (HOSPITAL_COMMUNITY): Admission: RE | Payer: Self-pay | Source: Ambulatory Visit

## 2011-12-11 ENCOUNTER — Ambulatory Visit (HOSPITAL_COMMUNITY): Admission: RE | Admit: 2011-12-11 | Payer: Medicare Other | Source: Ambulatory Visit | Admitting: Orthopedic Surgery

## 2011-12-11 HISTORY — DX: Carpal tunnel syndrome, unspecified upper limb: G56.00

## 2011-12-11 HISTORY — DX: Reserved for concepts with insufficient information to code with codable children: IMO0002

## 2011-12-11 HISTORY — DX: Irritable bowel syndrome, unspecified: K58.9

## 2011-12-11 SURGERY — ARTHROSCOPY, KNEE
Anesthesia: Choice | Laterality: Left

## 2011-12-11 NOTE — Preoperative (Signed)
Beta Blockers   Reason not to administer Beta Blockers:Not Applicable 

## 2011-12-15 DIAGNOSIS — R3129 Other microscopic hematuria: Secondary | ICD-10-CM | POA: Diagnosis not present

## 2011-12-15 DIAGNOSIS — R823 Hemoglobinuria: Secondary | ICD-10-CM | POA: Diagnosis not present

## 2011-12-24 DIAGNOSIS — N23 Unspecified renal colic: Secondary | ICD-10-CM | POA: Diagnosis not present

## 2011-12-24 DIAGNOSIS — R310 Gross hematuria: Secondary | ICD-10-CM | POA: Diagnosis not present

## 2011-12-24 DIAGNOSIS — R319 Hematuria, unspecified: Secondary | ICD-10-CM | POA: Diagnosis not present

## 2011-12-24 DIAGNOSIS — N3010 Interstitial cystitis (chronic) without hematuria: Secondary | ICD-10-CM | POA: Diagnosis not present

## 2012-01-12 DIAGNOSIS — R112 Nausea with vomiting, unspecified: Secondary | ICD-10-CM | POA: Diagnosis not present

## 2012-01-12 DIAGNOSIS — M549 Dorsalgia, unspecified: Secondary | ICD-10-CM | POA: Diagnosis not present

## 2012-01-12 DIAGNOSIS — R109 Unspecified abdominal pain: Secondary | ICD-10-CM | POA: Diagnosis not present

## 2012-01-12 DIAGNOSIS — G518 Other disorders of facial nerve: Secondary | ICD-10-CM | POA: Diagnosis not present

## 2012-01-12 DIAGNOSIS — IMO0001 Reserved for inherently not codable concepts without codable children: Secondary | ICD-10-CM | POA: Diagnosis not present

## 2012-01-12 DIAGNOSIS — R51 Headache: Secondary | ICD-10-CM | POA: Diagnosis not present

## 2012-01-12 DIAGNOSIS — R319 Hematuria, unspecified: Secondary | ICD-10-CM | POA: Diagnosis not present

## 2012-01-12 DIAGNOSIS — M542 Cervicalgia: Secondary | ICD-10-CM | POA: Diagnosis not present

## 2012-01-12 DIAGNOSIS — B1920 Unspecified viral hepatitis C without hepatic coma: Secondary | ICD-10-CM | POA: Diagnosis not present

## 2012-01-12 DIAGNOSIS — E1169 Type 2 diabetes mellitus with other specified complication: Secondary | ICD-10-CM | POA: Diagnosis not present

## 2012-01-12 DIAGNOSIS — N3020 Other chronic cystitis without hematuria: Secondary | ICD-10-CM | POA: Diagnosis not present

## 2012-01-12 DIAGNOSIS — Z79899 Other long term (current) drug therapy: Secondary | ICD-10-CM | POA: Diagnosis not present

## 2012-01-14 DIAGNOSIS — R319 Hematuria, unspecified: Secondary | ICD-10-CM | POA: Diagnosis not present

## 2012-01-14 DIAGNOSIS — E119 Type 2 diabetes mellitus without complications: Secondary | ICD-10-CM | POA: Diagnosis not present

## 2012-01-14 DIAGNOSIS — R5381 Other malaise: Secondary | ICD-10-CM | POA: Diagnosis not present

## 2012-01-14 DIAGNOSIS — R1084 Generalized abdominal pain: Secondary | ICD-10-CM | POA: Diagnosis not present

## 2012-01-21 ENCOUNTER — Ambulatory Visit (INDEPENDENT_AMBULATORY_CARE_PROVIDER_SITE_OTHER): Payer: Medicare Other | Admitting: Gastroenterology

## 2012-01-21 DIAGNOSIS — B182 Chronic viral hepatitis C: Secondary | ICD-10-CM

## 2012-01-22 LAB — HEPATITIS B SURFACE ANTIBODY,QUALITATIVE: Hep B S Ab: NEGATIVE

## 2012-01-22 LAB — HEPATIC FUNCTION PANEL
ALT: 112 U/L — ABNORMAL HIGH (ref 0–35)
AST: 100 U/L — ABNORMAL HIGH (ref 0–37)
Alkaline Phosphatase: 66 U/L (ref 39–117)
Bilirubin, Direct: 0.2 mg/dL (ref 0.0–0.3)
Indirect Bilirubin: 0.4 mg/dL (ref 0.0–0.9)
Total Protein: 6.9 g/dL (ref 6.0–8.3)

## 2012-01-22 LAB — CBC WITH DIFFERENTIAL/PLATELET
Basophils Absolute: 0 10*3/uL (ref 0.0–0.1)
Basophils Relative: 0 % (ref 0–1)
Eosinophils Relative: 1 % (ref 0–5)
HCT: 46.8 % — ABNORMAL HIGH (ref 36.0–46.0)
Hemoglobin: 15.4 g/dL — ABNORMAL HIGH (ref 12.0–15.0)
MCHC: 32.9 g/dL (ref 30.0–36.0)
MCV: 98.7 fL (ref 78.0–100.0)
Monocytes Absolute: 0.7 10*3/uL (ref 0.1–1.0)
Monocytes Relative: 9 % (ref 3–12)
Neutro Abs: 5 10*3/uL (ref 1.7–7.7)
RDW: 13 % (ref 11.5–15.5)

## 2012-01-22 LAB — HEPATITIS B CORE ANTIBODY, TOTAL: Hep B Core Total Ab: NEGATIVE

## 2012-01-22 LAB — PROTIME-INR: INR: 1.02 (ref <1.50)

## 2012-01-28 NOTE — Progress Notes (Signed)
NAMEMarland Kitchen  Susan Hogan, Susan Hogan    MR#:  [id number]      DATE:  01/21/2012  DOB:  [birth year]    cc: Consulting Physician:  Isac Caddy, MD, [facility], [address], [place], [place] [address], Phone [phone], Fax [phone]  Santiago Glad, MD, [facility], [address] [place], [place] [address], [place] [phone]  Primary Care Physician:  Same.  Referring Physician:  Jone Baseman. Mayford Knife, MD, [facility], [address], [place], [place] [address], Fax [phone]    reason for referral:  Genotype 1 hepatitis C.  history:  The patient is a 51 year old woman who I have been asked to see in consultation by Dr. Mayford Knife regarding her genotype 1 hepatitis C.  It will be recalled that the patient was previously seen by our service for her hepatitis C on 01/13/2005. Her history actually dates back to around 07/14/2002, when she had liver tests done as part of an investigation for complaints of fatigue. They were found to be abnormal with an ALT of 89. On 07/14/2002, added on testing showed that she was hepatitis B surface antigen negative, but hepatitis C antibody positive. She was referred to Dr. Luther Parody on 07/27/2002. Testing found that she was genotype 1. Liver biopsy was ordered on 08/22/2002. The liver biopsy of 09/28/2002, showed grade 2 stage zero disease. There was patchy mild steatosis. The iron stain was negative. I gather that therapy was deferred because of the lack of fibrosis. She was then seen in consultation in our clinic on 01/13/2005, and I gather another liver biopsy of 03/27/2005, ordered by our service, showed grade 2 stage zero disease with a negative iron stain. She returned in followup on 04/09/2005, to review the results of the biopsy. It was thought that she was not a candidate for treatment because of ongoing depression. At the time, she was suppose to see in followup in a years' time but it appears that she was lost  to followup and there is no documentation in our chart as to what occurred.   The patient reports that her primary physician, Dr. Mayford Knife, has asked her to return to see [place] because she continues to complain of fatigue, and according to her, her primary care physician thinks that the fatigue will be addressed by putting her on treatment for her hepatitis C although I have no independent documentation of such. There are no symptoms directly referable to her history of hepatitis C, because of course fatigue is not a symptom of hepatitis C. There are no symptoms to suggest cryoglobulin mediated or decompensated disease. She reports that she has had easy bruising but looking through her labs, there is no indication of synthetic decompensation to explain that.   With respect to risk factors for liver disease, she reports she drinks rarely, but had a significant alcohol use history in her 95s.  She denies any history of intravenous or intranasal drug use. There is a history of tattooing, which she believes is her risk factor for acquisition of hepatitis C. There is no history of unsterile body piercing or blood transfusion prior to 1992. There is no family history of liver disease. Our records indicate she received her first hepatitis A and B vaccinations, as well as second B vaccination but did not return for followup. She reports today that she did not return because of her lack of insurance.   past medical history: Significant for type 2 diabetes. She reports that she only checks her blood sugars  randomly for the last several months, and does no know her last A1c. She reports that she is only checking her blood sugars randomly because she lost 100 pounds on phentermine and believes diabetes should be better controlled because of that. In the records I received, on 05/28/2011, her hemoglobin A1c was 5.7%. She denies any retinopathy, neuropathy, or nephropathy. She denies any history of dyslipidemia or coronary  artery disease, hypertension.   past surgical history:  No surgeries.  past psychiatric history:  Depression without a history of psychiatric hospitalizations or suicide attempts. The patient was previously seen by a therapist at [facility] [address] in [place], where she was seen by a [employer]. She brings today a note from Ms. McGeachy indicating that she had attended their clinic since December 2009 for a history of major depressive disorder with "reoccurring episodes." The note dated 11/02/2008, indicates that the patient displayed a "lack of motivation" with emotional lability. She indicates compliance with followup appointments with the therapist. However, the patient has not sought therapy for the last 2 years because she did not think she was "getting anywhere."   current medications:  1. Metformin 850 mg b.i.d. 2. Phentermine 37.5 mg daily for weight loss. 3. Ondansetron 8 mg q. 6 hours.  4. Imipramine 15 mg daily. 5. Omeprazole 40 mg daily. allergies:  Denies.  habits:  Smoking, denies. Alcohol as above.   family history: As above.   social history:  Not married. Not working and lives with her 38 year old son and 70 year old daughter.   REVIEW OF SYSTEMS: They are significant for the complaints of significant fatigue and easy bruising but otherwise all 10 systems reviewed today with the patient are negative other than which was mentioned above. Her CES-D was 45.   physical examination: Constitutional: Central obesity. Vital Signs: Height 59 inches, weight 204 pounds, blood pressure 116/80, pulse 75, temperature 97.4 Fahrenheit. Ears, Nose, Mouth, and Throat: Unremarkable oropharynx. No thyromegaly or neck masses. Chest: Resonant to percussion. Clear to auscultation. Cardiovascular: Heart sounds normal S1, S2 without murmurs or rubs. There is no peripheral edema. Abdominal exam: Normal bowel sounds. No masses or tenderness.  The abdomen was obese. There was no ascites. I could not appreciate liver edge or spleen tip. Lymphatics: No cervical, inguinal lymphadenopathy. Central nervous system: No asterixis, focal neurologic findings. Dermatologic: Anicteric. There was no palmar erythema. There was an ecchymoses over the left forearm. Eyes: Anicteric sclerae. Pupils are equal and reactive to light.   laboratories:  Previous lab work from 05/28/2011; her triglycerides were 184, TSH 1.895, hemoglobin A1c 5.7%, AST 98, ALT 117, ALP 86, total bilirubin 0.6, albumin 4.3, globulins 2.7, creatinine 0.73. CBC was unremarkable with a platelet count of 215.   On 06/04/2011; her hepatitis B surface antigen was negative and her C antibody, of course, was positive.   assessment:  The patient is a 51 year old woman with a history of genotype 1 (not further subtyped), hepatitis C with the most recent liver biopsy on 03/27/2005, showing grade 2, stage 0 disease, which represents stable disease compared to a biopsy of 09/28/2002. She is nave to treatment. She previously presented as a poor candidate for therapy because of depression. She continues to present as a poor candidate for therapy because of depression. As to her belief that her symptoms of significant fatigue would be alleviated by therapy for hepatitis C, this is not true because as mentioned above, her fatigue is not a symptom of hepatitis  C and it is likely that interferon based therapies will make her feel worse while on therapy.   My most significant concern, for treating her hepatitis C, is that of her depression. This is untreated and without treatment and followup by appropriate mental health professional, I would not consider treating her with interferon based therapies. I am also concerned about the fact that she is not monitoring her blood sugars on a regular basis, as interferon can worsen diabetes control and poorly controlled diabetes can negatively impact the effect of  interferon on hepatitis C.   In my discussion today with the patient, we discussed her misconception that her symptoms are related to hepatitis C. We discussed her previous liver biopsy and genotyping, and the significance thereof. I have explained to her that current therapy is still interferon based, and because of her history of untreated depression, I would not consider treating her for hepatitis C. I furthermore warned her that her symptoms of fatigue would exacerbated by treatment. I have explained to her that in order for her to demonstrate motivation to be treated for hepatitis C, she needs to establish a relationship with a counselor. We also discussed the fact that she had previously had minimal fibrosis. We discussed the value of doing a liver biopsy at this time, but I explained to her that I would not treat her because of depression, I would not do a biopsy at this time. We discussed the up and coming therapies for hepatitis C that would not involve interferon, but these are at least 6 months away.   plan:  1. Because she did not complete her hepatitis A and B vaccination series, I will test her for hepatitis A and B immunity.  2. Will obtain a genotype to subtype her, 1a versus 1b.  3. Will check a liver enzyme and INR with a CBC particularly because of her complaints of ecchymosis, which I think is not related to her liver disease.  4. I have given her literature on hepatitis C including literature on her biopsy, and the address for [email or web address] to review current and future therapies.  5. She is to return in 6 months' time in followup.  6. In the interim, I have told her that she must establish care with a therapist for her depression.  7. In the interim, I have also discussed the need to monitor her blood sugars on a more regular basis.                 Brooke Dare, MD   ADDENDUM:  Genotype 1a.  Hepatitis A immune.  Hepatitis B nave.   [id number] .[id number]  D:  Thu May 02  18:47:25 2013 ; T:  Fri May 03 00:49:43 2013  Job #:  [id number]

## 2012-02-05 DIAGNOSIS — R823 Hemoglobinuria: Secondary | ICD-10-CM | POA: Diagnosis not present

## 2012-02-05 DIAGNOSIS — N3010 Interstitial cystitis (chronic) without hematuria: Secondary | ICD-10-CM | POA: Diagnosis not present

## 2012-02-23 DIAGNOSIS — N3010 Interstitial cystitis (chronic) without hematuria: Secondary | ICD-10-CM | POA: Diagnosis not present

## 2012-02-26 DIAGNOSIS — F331 Major depressive disorder, recurrent, moderate: Secondary | ICD-10-CM | POA: Diagnosis not present

## 2012-03-06 ENCOUNTER — Encounter (HOSPITAL_COMMUNITY): Payer: Self-pay

## 2012-03-06 ENCOUNTER — Emergency Department (HOSPITAL_COMMUNITY)
Admission: EM | Admit: 2012-03-06 | Discharge: 2012-03-06 | Disposition: A | Discharge status: Home / Self Care | Payer: Medicare Other | Attending: Emergency Medicine | Admitting: Emergency Medicine

## 2012-03-06 ENCOUNTER — Emergency Department (HOSPITAL_COMMUNITY): Payer: Medicare Other

## 2012-03-06 DIAGNOSIS — M7989 Other specified soft tissue disorders: Secondary | ICD-10-CM | POA: Diagnosis not present

## 2012-03-06 DIAGNOSIS — W010XXA Fall on same level from slipping, tripping and stumbling without subsequent striking against object, initial encounter: Secondary | ICD-10-CM | POA: Insufficient documentation

## 2012-03-06 DIAGNOSIS — Z79899 Other long term (current) drug therapy: Secondary | ICD-10-CM | POA: Insufficient documentation

## 2012-03-06 DIAGNOSIS — S93409A Sprain of unspecified ligament of unspecified ankle, initial encounter: Secondary | ICD-10-CM | POA: Insufficient documentation

## 2012-03-06 DIAGNOSIS — M25579 Pain in unspecified ankle and joints of unspecified foot: Secondary | ICD-10-CM | POA: Diagnosis not present

## 2012-03-06 DIAGNOSIS — Y92009 Unspecified place in unspecified non-institutional (private) residence as the place of occurrence of the external cause: Secondary | ICD-10-CM | POA: Insufficient documentation

## 2012-03-06 DIAGNOSIS — Z880 Allergy status to penicillin: Secondary | ICD-10-CM | POA: Diagnosis not present

## 2012-03-06 DIAGNOSIS — E119 Type 2 diabetes mellitus without complications: Secondary | ICD-10-CM | POA: Diagnosis not present

## 2012-03-06 DIAGNOSIS — Z888 Allergy status to other drugs, medicaments and biological substances status: Secondary | ICD-10-CM | POA: Insufficient documentation

## 2012-03-06 DIAGNOSIS — J45909 Unspecified asthma, uncomplicated: Secondary | ICD-10-CM | POA: Insufficient documentation

## 2012-03-06 MED ORDER — OXYCODONE-ACETAMINOPHEN 5-325 MG PO TABS
1.0000 | ORAL_TABLET | Freq: Once | ORAL | Status: AC
  Administered 2012-03-06: 1 via ORAL
  Filled 2012-03-06: qty 1

## 2012-03-06 MED ORDER — OXYCODONE-ACETAMINOPHEN 5-325 MG PO TABS: 1.0000 | ORAL_TABLET | ORAL | Status: AC | PRN

## 2012-03-06 MED ORDER — IBUPROFEN 800 MG PO TABS
800.0000 mg | ORAL_TABLET | Freq: Once | ORAL | Status: AC
  Administered 2012-03-06: 800 mg via ORAL
  Filled 2012-03-06: qty 1

## 2012-03-06 NOTE — ED Notes (Signed)
Pt presents with no acute distress- Pt tripped.  C/o of left ankle pain- denies neck and head and back pain

## 2012-03-06 NOTE — ED Notes (Signed)
Susan Hogan present to teach crutch walking

## 2012-03-06 NOTE — ED Notes (Signed)
Fall 1600 hours

## 2012-03-06 NOTE — ED Provider Notes (Signed)
History     CSN: [id number]  Arrival date & time 03/06/12  1619   First MD Initiated Contact with Patient 03/06/12 1702      No chief complaint on file.   (Consider location/radiation/quality/duration/timing/severity/associated sxs/prior treatment) Patient is a 51 y.o. female presenting with ankle pain. The history is provided by the patient and a friend. No language interpreter was used.  Ankle Pain  The incident occurred 1 to 2 hours ago. The incident occurred at home. The injury mechanism was a fall. The pain is present in the right ankle. The pain is at a severity of 10/10. The pain is moderate. The pain has been constant since onset. Associated symptoms include inability to bear weight. The symptoms are aggravated by activity. She has tried nothing for the symptoms.  Patient tripped earlier and twisted her r ankle no defomity noted.  Limited ROM due to pain.  Swelling noted.  Goes to [facility]. Chrissie [facility] at [facility].   Past Medical History  Diagnosis Date  . Fibromyalgia   . Diabetes mellitus   . Heart murmur   . Asthma   . Shortness of breath   . Headache     migraines  . Depression   . Recurrent upper respiratory infection (URI)   . Complication of anesthesia     difficulty breathing  . Kidney stones     with hematuria  . Carpal tunnel syndrome     bilaterally  . Arthritis     RA "states does not see Rheumatologist"  . Irritable bowel syndrome   . Degenerative disk disease     back  . Kidney stone     with hematuria  . Hypertension     no medication for at this time, sees Dr. Estevan Oaks Pcp  . Hypotensive episode     hx of  . Hepatitis     In followup treatment for hepatitis C    Past Surgical History  Procedure Date  . Tubal ligation   . Dilation and curettage of uterus   . Liver biopsy   . Tonsillectomy     No family history on file.  History  Substance Use Topics  . Smoking status: Never Smoker   . Smokeless tobacco: Not on file  .  Alcohol Use: No    OB History    Grav Para Term Preterm Abortions TAB SAB Ect Mult Living                  Review of Systems  Constitutional: Negative.   HENT: Negative.   Eyes: Negative.   Respiratory: Negative.   Cardiovascular: Negative.   Gastrointestinal: Negative.   Musculoskeletal:       R ankle pain  Neurological: Negative.   Psychiatric/Behavioral: Negative.   All other systems reviewed and are negative.    Allergies  Penicillins and Anesthetic ether  Home Medications   Current Outpatient Rx  Name Route Sig Dispense Refill  . CETIRIZINE HCL 10 MG PO TABS Oral Take 10 mg by mouth daily as needed. For allergies.    Marland Kitchen FLUTICASONE-SALMETEROL 250-50 MCG/DOSE IN AEPB Inhalation Inhale 1 puff into the lungs every 12 (twelve) hours.    Marland Kitchen METFORMIN HCL 850 MG PO TABS Oral Take 425 mg by mouth 2 (two) times daily with a meal.     . OMEPRAZOLE 40 MG PO CPDR Oral Take 40 mg by mouth daily.    Marland Kitchen PHENTERMINE HCL 37.5 MG PO CAPS Oral Take 37.5 mg by  mouth every morning.    Marland Kitchen SALINE NASAL SPRAY 0.65 % NA SOLN Nasal Place 1 spray into the nose daily as needed. For congestion.      There were no vitals taken for this visit.  Physical Exam  Nursing note and vitals reviewed. Constitutional: She is oriented to person, place, and time. She appears well-developed and well-nourished.  HENT:  Head: Normocephalic and atraumatic.  Eyes: Conjunctivae and EOM are normal. Pupils are equal, round, and reactive to light.  Neck: Normal range of motion. Neck supple.  Cardiovascular: Normal rate.   Pulmonary/Chest: Effort normal.  Abdominal: Soft.  Musculoskeletal: She exhibits edema and tenderness.       R ankle swelling and tenderness.  Limited ROM due to pain  Neurological: She is alert and oriented to person, place, and time. She has normal reflexes.  Skin: Skin is warm and dry.  Psychiatric: She has a normal mood and affect.    ED Course  Procedures (including critical care  time)  Labs Reviewed - No data to display No results found.   No diagnosis found.    MDM  R ankle sprain.  RICE with crutches, ASO and rx for pain meds.  Follow up with pcp as needed.  Return for severe pain.       Remi Haggard, NP 03/07/12 1931

## 2012-03-06 NOTE — Discharge Instructions (Signed)
Susan Hogan keep the ankle elevated above your heart for 24 hours. Use ice intermittently to the right ankle x24 hours. Use the crutches as needed to get around. Keep ASO on for ankle stability. Gradually bear weight on the ankle. You can followup with orthopedic listed below if not better or your primary care physician. Use the Percocet for pain but do not drive with this medication. Take ibuprofen with the Percocet for pain control. Return for severe pain or weakness or any concerns. Ankle Sprain An ankle sprain happens when the bands of tissue that hold the ankle bones together (ligaments) stretch too much and tear.  HOME CARE   Put ice on the ankle for 15 to 20 minutes, 3 to 4 times a day.   Put ice in a plastic bag.   Place a towel between your skin and the bag.   You may stop icing when the puffiness (swelling) goes down.   Raise (elevate) the injured ankle to lessen puffiness.   Use crutches if your doctor tells you to. Use them until you can walk without pain.   If a plaster splint was applied:   Rest the plaster splint on nothing harder than a pillow for 24 hours.   Do not put weight on it.   Do not get it wet.   Take it off to shower or bathe.   Follow up with your doctor.   Use an elastic wrap for support. Take the wrap off if the toes lose feeling (numb), tingle, or turn cold or blue.   If an air splint was applied:   Add or release air to make it comfortable.   Take it off at night and to shower and bathe.   Wiggle your toes while wearing the air splint.   Only take medicine as told by your doctor.   Do not drive until your doctor says it is okay.  GET HELP RIGHT AWAY IF:   You have more bruising, puffiness, or pain.   Your toes feel cold.   Your medicine does not help lessen your pain.   You are losing feeling in your toes or they turn blue.   You have severe pain.  MAKE SURE YOU:   Understand these instructions.   Will watch your condition.    Will get help right away if you are not doing well or get worse.  Document Released: 02/24/2008 Document Revised: 08/27/2011 Document Reviewed: 02/24/2008 [facility] Patient Information 2012 [place], [place].Ankle Sprain An ankle sprain happens when the bands of tissue that hold the ankle bones together (ligaments) stretch too much and tear.  HOME CARE   Put ice on the ankle for 15 to 20 minutes, 3 to 4 times a day.   Put ice in a plastic bag.   Place a towel between your skin and the bag.   You may stop icing when the puffiness (swelling) goes down.   Raise (elevate) the injured ankle to lessen puffiness.   Use crutches if your doctor tells you to. Use them until you can walk without pain.   If a plaster splint was applied:   Rest the plaster splint on nothing harder than a pillow for 24 hours.   Do not put weight on it.   Do not get it wet.   Take it off to shower or bathe.   Follow up with your doctor.   Use an elastic wrap for support. Take the wrap off if the toes lose feeling (numb),  tingle, or turn cold or blue.   If an air splint was applied:   Add or release air to make it comfortable.   Take it off at night and to shower and bathe.   Wiggle your toes while wearing the air splint.   Only take medicine as told by your doctor.   Do not drive until your doctor says it is okay.  GET HELP RIGHT AWAY IF:   You have more bruising, puffiness, or pain.   Your toes feel cold.   Your medicine does not help lessen your pain.   You are losing feeling in your toes or they turn blue.   You have severe pain.  MAKE SURE YOU:   Understand these instructions.   Will watch your condition.   Will get help right away if you are not doing well or get worse.  Document Released: 02/24/2008 Document Revised: 08/27/2011 Document Reviewed: 02/24/2008 [facility] Patient Information 2012 [place], [place].

## 2012-03-08 DIAGNOSIS — F4310 Post-traumatic stress disorder, unspecified: Secondary | ICD-10-CM | POA: Diagnosis not present

## 2012-03-08 NOTE — ED Provider Notes (Signed)
Medical screening examination/treatment/procedure(s) were performed by non-physician practitioner and as supervising physician I was immediately available for consultation/collaboration.  Deedra Pro T Annalysia Willenbring, MD 03/08/12 0827 

## 2012-03-11 DIAGNOSIS — F331 Major depressive disorder, recurrent, moderate: Secondary | ICD-10-CM | POA: Diagnosis not present

## 2012-03-21 DIAGNOSIS — F4310 Post-traumatic stress disorder, unspecified: Secondary | ICD-10-CM | POA: Diagnosis not present

## 2012-03-25 DIAGNOSIS — F331 Major depressive disorder, recurrent, moderate: Secondary | ICD-10-CM | POA: Diagnosis not present

## 2012-03-29 DIAGNOSIS — F4310 Post-traumatic stress disorder, unspecified: Secondary | ICD-10-CM | POA: Diagnosis not present

## 2012-04-04 DIAGNOSIS — E119 Type 2 diabetes mellitus without complications: Secondary | ICD-10-CM | POA: Diagnosis not present

## 2012-04-04 DIAGNOSIS — F4310 Post-traumatic stress disorder, unspecified: Secondary | ICD-10-CM | POA: Diagnosis not present

## 2012-04-04 DIAGNOSIS — R5381 Other malaise: Secondary | ICD-10-CM | POA: Diagnosis not present

## 2012-04-04 DIAGNOSIS — K219 Gastro-esophageal reflux disease without esophagitis: Secondary | ICD-10-CM | POA: Diagnosis not present

## 2012-04-04 DIAGNOSIS — IMO0001 Reserved for inherently not codable concepts without codable children: Secondary | ICD-10-CM | POA: Diagnosis not present

## 2012-04-04 DIAGNOSIS — R5383 Other fatigue: Secondary | ICD-10-CM | POA: Diagnosis not present

## 2012-04-11 DIAGNOSIS — F4310 Post-traumatic stress disorder, unspecified: Secondary | ICD-10-CM | POA: Diagnosis not present

## 2012-04-14 DIAGNOSIS — F4310 Post-traumatic stress disorder, unspecified: Secondary | ICD-10-CM | POA: Diagnosis not present

## 2012-04-15 DIAGNOSIS — F331 Major depressive disorder, recurrent, moderate: Secondary | ICD-10-CM | POA: Diagnosis not present

## 2012-05-13 DIAGNOSIS — F4310 Post-traumatic stress disorder, unspecified: Secondary | ICD-10-CM | POA: Diagnosis not present

## 2012-05-17 DIAGNOSIS — F39 Unspecified mood [affective] disorder: Secondary | ICD-10-CM | POA: Diagnosis not present

## 2012-05-17 DIAGNOSIS — E785 Hyperlipidemia, unspecified: Secondary | ICD-10-CM | POA: Diagnosis not present

## 2012-05-17 DIAGNOSIS — N951 Menopausal and female climacteric states: Secondary | ICD-10-CM | POA: Diagnosis not present

## 2012-05-17 DIAGNOSIS — R1084 Generalized abdominal pain: Secondary | ICD-10-CM | POA: Diagnosis not present

## 2012-05-17 DIAGNOSIS — E119 Type 2 diabetes mellitus without complications: Secondary | ICD-10-CM | POA: Diagnosis not present

## 2012-05-17 DIAGNOSIS — IMO0001 Reserved for inherently not codable concepts without codable children: Secondary | ICD-10-CM | POA: Diagnosis not present

## 2012-05-17 DIAGNOSIS — K219 Gastro-esophageal reflux disease without esophagitis: Secondary | ICD-10-CM | POA: Diagnosis not present

## 2012-05-17 DIAGNOSIS — F411 Generalized anxiety disorder: Secondary | ICD-10-CM | POA: Diagnosis not present

## 2012-05-17 DIAGNOSIS — R5383 Other fatigue: Secondary | ICD-10-CM | POA: Diagnosis not present

## 2012-05-17 DIAGNOSIS — R5381 Other malaise: Secondary | ICD-10-CM | POA: Diagnosis not present

## 2012-05-20 ENCOUNTER — Other Ambulatory Visit: Payer: Self-pay | Admitting: Physician Assistant

## 2012-05-20 DIAGNOSIS — R110 Nausea: Secondary | ICD-10-CM

## 2012-05-20 DIAGNOSIS — R198 Other specified symptoms and signs involving the digestive system and abdomen: Secondary | ICD-10-CM

## 2012-05-20 DIAGNOSIS — R109 Unspecified abdominal pain: Secondary | ICD-10-CM

## 2012-05-26 ENCOUNTER — Ambulatory Visit
Admission: RE | Admit: 2012-05-26 | Discharge: 2012-05-26 | Disposition: A | Discharge status: Home / Self Care | Payer: Medicare Other | Source: Ambulatory Visit | Attending: Physician Assistant | Admitting: Physician Assistant

## 2012-05-26 DIAGNOSIS — R198 Other specified symptoms and signs involving the digestive system and abdomen: Secondary | ICD-10-CM | POA: Diagnosis not present

## 2012-05-26 DIAGNOSIS — R109 Unspecified abdominal pain: Secondary | ICD-10-CM | POA: Diagnosis not present

## 2012-05-26 DIAGNOSIS — R110 Nausea: Secondary | ICD-10-CM | POA: Diagnosis not present

## 2012-05-27 DIAGNOSIS — F4310 Post-traumatic stress disorder, unspecified: Secondary | ICD-10-CM | POA: Diagnosis not present

## 2012-06-14 ENCOUNTER — Encounter (HOSPITAL_COMMUNITY): Payer: Self-pay | Admitting: *Deleted

## 2012-06-14 ENCOUNTER — Emergency Department (HOSPITAL_COMMUNITY): Payer: Medicare Other

## 2012-06-14 ENCOUNTER — Emergency Department (HOSPITAL_COMMUNITY)
Admission: EM | Admit: 2012-06-14 | Discharge: 2012-06-14 | Disposition: A | Discharge status: Home / Self Care | Payer: Medicare Other | Attending: Emergency Medicine | Admitting: Emergency Medicine

## 2012-06-14 DIAGNOSIS — Z880 Allergy status to penicillin: Secondary | ICD-10-CM | POA: Insufficient documentation

## 2012-06-14 DIAGNOSIS — Z886 Allergy status to analgesic agent status: Secondary | ICD-10-CM | POA: Insufficient documentation

## 2012-06-14 DIAGNOSIS — R0989 Other specified symptoms and signs involving the circulatory and respiratory systems: Secondary | ICD-10-CM | POA: Diagnosis not present

## 2012-06-14 DIAGNOSIS — K589 Irritable bowel syndrome without diarrhea: Secondary | ICD-10-CM | POA: Diagnosis not present

## 2012-06-14 DIAGNOSIS — Z79899 Other long term (current) drug therapy: Secondary | ICD-10-CM | POA: Insufficient documentation

## 2012-06-14 DIAGNOSIS — R0602 Shortness of breath: Secondary | ICD-10-CM | POA: Diagnosis not present

## 2012-06-14 DIAGNOSIS — E119 Type 2 diabetes mellitus without complications: Secondary | ICD-10-CM | POA: Diagnosis not present

## 2012-06-14 DIAGNOSIS — J45909 Unspecified asthma, uncomplicated: Secondary | ICD-10-CM | POA: Diagnosis not present

## 2012-06-14 DIAGNOSIS — R059 Cough, unspecified: Secondary | ICD-10-CM | POA: Diagnosis not present

## 2012-06-14 DIAGNOSIS — R05 Cough: Secondary | ICD-10-CM | POA: Insufficient documentation

## 2012-06-14 DIAGNOSIS — B182 Chronic viral hepatitis C: Secondary | ICD-10-CM | POA: Insufficient documentation

## 2012-06-14 DIAGNOSIS — G43909 Migraine, unspecified, not intractable, without status migrainosus: Secondary | ICD-10-CM | POA: Insufficient documentation

## 2012-06-14 DIAGNOSIS — Z884 Allergy status to anesthetic agent status: Secondary | ICD-10-CM | POA: Insufficient documentation

## 2012-06-14 NOTE — ED Provider Notes (Signed)
Medical screening examination/treatment/procedure(s) were performed by non-physician practitioner and as supervising physician I was immediately available for consultation/collaboration.  Olivia Mackie, MD 06/14/12 [phone]

## 2012-06-14 NOTE — ED Provider Notes (Signed)
History     CSN: [id number]  Arrival date & time 06/14/12  0444   First MD Initiated Contact with Patient 06/14/12 [phone]      Chief Complaint  Patient presents with  . Cough    (Consider location/radiation/quality/duration/timing/severity/associated sxs/prior treatment) HPI  51 y.o. -year-old female in no acute distress complaining of resolved sensation of shortness of breath what after coughing early this morning. Patient reports she has a mucous sensation in her throat. She denies any chronic cough, fever, shortness of breath, change in bowel or bladder habits or nausea and vomiting. She denies any pain. Patient has been under a lot of stress lately and thinks this might be a anxiety attack.  Past Medical History  Diagnosis Date  . Fibromyalgia   . Diabetes mellitus   . Heart murmur   . Asthma   . Shortness of breath   . Headache     migraines  . Depression   . Recurrent upper respiratory infection (URI)   . Complication of anesthesia     difficulty breathing  . Kidney stones     with hematuria  . Carpal tunnel syndrome     bilaterally  . Arthritis     RA "states does not see Rheumatologist"  . Irritable bowel syndrome   . Degenerative disk disease     back  . Kidney stone     with hematuria  . Hypertension     no medication for at this time, sees Dr. Estevan Oaks Pcp  . Hypotensive episode     hx of  . Hepatitis     In followup treatment for hepatitis C    Past Surgical History  Procedure Date  . Tubal ligation   . Dilation and curettage of uterus   . Liver biopsy   . Tonsillectomy     History reviewed. No pertinent family history.  History  Substance Use Topics  . Smoking status: Never Smoker   . Smokeless tobacco: Not on file  . Alcohol Use: No    OB History    Grav Para Term Preterm Abortions TAB SAB Ect Mult Living                  Review of Systems  Constitutional: Negative for fever.  Respiratory: Positive for cough and shortness  of breath.   Cardiovascular: Negative for chest pain.  Gastrointestinal: Negative for nausea, vomiting, abdominal pain and diarrhea.    Allergies  Nubain; Penicillins; and Anesthetic ether  Home Medications   Current Outpatient Rx  Name Route Sig Dispense Refill  . CETIRIZINE HCL 10 MG PO TABS Oral Take 10 mg by mouth daily as needed. For allergies.    Marland Kitchen FLUTICASONE-SALMETEROL 250-50 MCG/DOSE IN AEPB Inhalation Inhale 1 puff into the lungs every 12 (twelve) hours.    Marland Kitchen METFORMIN HCL 850 MG PO TABS Oral Take 425 mg by mouth 2 (two) times daily with a meal.     . OMEPRAZOLE 40 MG PO CPDR Oral Take 40 mg by mouth daily.    Marland Kitchen PHENTERMINE HCL 37.5 MG PO CAPS Oral Take 37.5 mg by mouth every morning.    Marland Kitchen SALINE NASAL SPRAY 0.65 % NA SOLN Nasal Place 1 spray into the nose daily as needed. For congestion.      BP 131/82  Pulse 77  Temp 98.6 F (37 C) (Oral)  Resp 20  SpO2 100%  Physical Exam  Nursing note and vitals reviewed. Constitutional: She is oriented to person, place,  and time. She appears well-developed and well-nourished. No distress.  HENT:  Head: Normocephalic.  Eyes: Conjunctivae normal and EOM are normal. Pupils are equal, round, and reactive to light.  Neck: Normal range of motion. No JVD present.  Cardiovascular: Normal rate and intact distal pulses.   Pulmonary/Chest: Effort normal and breath sounds normal. No stridor. No respiratory distress. She has no wheezes. She has no rales. She exhibits no tenderness.  Abdominal: Soft. Bowel sounds are normal. She exhibits no distension. There is no tenderness.  Musculoskeletal: Normal range of motion.  Lymphadenopathy:    She has no cervical adenopathy.  Neurological: She is alert and oriented to person, place, and time.  Psychiatric: She has a normal mood and affect.    ED Course  Procedures (including critical care time)  Labs Reviewed - No data to display Dg Chest 2 View  06/14/2012  *RADIOLOGY REPORT*  Clinical  Data: Short of breath.  Cough.  Congestion.  CHEST - 2 VIEW  Comparison: 11/27/2011.  Findings:  Cardiopericardial silhouette within normal limits. Mediastinal contours normal. Trachea midline.  No airspace disease or effusion.  IMPRESSION: No active cardiopulmonary disease.   Original Report Authenticated By: Andreas Newport, M.D.      1. Cough       MDM  Sensation of cough and shortness of breath is now resolved. Lung sounds are clear patient is saturating at 100% on room air, there is no stridor, chest x-ray is clear, she is stable for discharge and follow with her primary care.        Wynetta Emery, PA-C 06/14/12 [phone]

## 2012-06-14 NOTE — ED Notes (Signed)
Pt c/o a cough since 3:30 am. Pt presents with dry cough, equal respirations unlabored, speaking in complete sentences. Pt denies chest pain. Pt reports pain in upper back and right neck pain. Pt states she thinks it might be due to anxiety because of all the personal issues she is dealing with. Pt is more concerned about telling me about her personal issues rather than her shortness of breath. O2 sat of 99% on RA. Skin is warm and dry, pt is A&Ox4. Pt suddenly crying on assessment.

## 2012-06-17 DIAGNOSIS — F4310 Post-traumatic stress disorder, unspecified: Secondary | ICD-10-CM | POA: Diagnosis not present

## 2012-07-28 DIAGNOSIS — E119 Type 2 diabetes mellitus without complications: Secondary | ICD-10-CM | POA: Diagnosis not present

## 2012-07-28 DIAGNOSIS — R5383 Other fatigue: Secondary | ICD-10-CM | POA: Diagnosis not present

## 2012-07-28 DIAGNOSIS — J019 Acute sinusitis, unspecified: Secondary | ICD-10-CM | POA: Diagnosis not present

## 2012-07-28 DIAGNOSIS — R5381 Other malaise: Secondary | ICD-10-CM | POA: Diagnosis not present

## 2012-07-28 DIAGNOSIS — J209 Acute bronchitis, unspecified: Secondary | ICD-10-CM | POA: Diagnosis not present

## 2012-08-04 ENCOUNTER — Ambulatory Visit: Payer: Medicare Other | Admitting: Gastroenterology

## 2012-12-08 DIAGNOSIS — E559 Vitamin D deficiency, unspecified: Secondary | ICD-10-CM | POA: Diagnosis not present

## 2012-12-08 DIAGNOSIS — Z79899 Other long term (current) drug therapy: Secondary | ICD-10-CM | POA: Diagnosis not present

## 2012-12-08 DIAGNOSIS — K219 Gastro-esophageal reflux disease without esophagitis: Secondary | ICD-10-CM | POA: Diagnosis not present

## 2012-12-08 DIAGNOSIS — N390 Urinary tract infection, site not specified: Secondary | ICD-10-CM | POA: Diagnosis not present

## 2012-12-08 DIAGNOSIS — B1710 Acute hepatitis C without hepatic coma: Secondary | ICD-10-CM | POA: Diagnosis not present

## 2012-12-08 DIAGNOSIS — R5383 Other fatigue: Secondary | ICD-10-CM | POA: Diagnosis not present

## 2012-12-08 DIAGNOSIS — E119 Type 2 diabetes mellitus without complications: Secondary | ICD-10-CM | POA: Diagnosis not present

## 2012-12-08 DIAGNOSIS — R5381 Other malaise: Secondary | ICD-10-CM | POA: Diagnosis not present

## 2012-12-27 DIAGNOSIS — R5381 Other malaise: Secondary | ICD-10-CM | POA: Diagnosis not present

## 2012-12-27 DIAGNOSIS — R5383 Other fatigue: Secondary | ICD-10-CM | POA: Diagnosis not present

## 2012-12-27 DIAGNOSIS — M62838 Other muscle spasm: Secondary | ICD-10-CM | POA: Diagnosis not present

## 2012-12-27 DIAGNOSIS — M79609 Pain in unspecified limb: Secondary | ICD-10-CM | POA: Diagnosis not present

## 2013-01-23 DIAGNOSIS — R5383 Other fatigue: Secondary | ICD-10-CM | POA: Diagnosis not present

## 2013-01-23 DIAGNOSIS — E119 Type 2 diabetes mellitus without complications: Secondary | ICD-10-CM | POA: Diagnosis not present

## 2013-01-23 DIAGNOSIS — T7840XA Allergy, unspecified, initial encounter: Secondary | ICD-10-CM | POA: Diagnosis not present

## 2013-01-23 DIAGNOSIS — R5381 Other malaise: Secondary | ICD-10-CM | POA: Diagnosis not present

## 2013-01-23 DIAGNOSIS — J029 Acute pharyngitis, unspecified: Secondary | ICD-10-CM | POA: Diagnosis not present

## 2013-02-19 ENCOUNTER — Emergency Department (HOSPITAL_COMMUNITY): Payer: Medicare Other

## 2013-02-19 ENCOUNTER — Encounter (HOSPITAL_COMMUNITY): Payer: Self-pay | Admitting: *Deleted

## 2013-02-19 ENCOUNTER — Emergency Department (HOSPITAL_COMMUNITY)
Admission: EM | Admit: 2013-02-19 | Discharge: 2013-02-20 | Disposition: A | Discharge status: Home / Self Care | Payer: Medicare Other | Attending: Emergency Medicine | Admitting: Emergency Medicine

## 2013-02-19 DIAGNOSIS — F3289 Other specified depressive episodes: Secondary | ICD-10-CM | POA: Diagnosis not present

## 2013-02-19 DIAGNOSIS — F329 Major depressive disorder, single episode, unspecified: Secondary | ICD-10-CM | POA: Insufficient documentation

## 2013-02-19 DIAGNOSIS — IMO0001 Reserved for inherently not codable concepts without codable children: Secondary | ICD-10-CM | POA: Diagnosis not present

## 2013-02-19 DIAGNOSIS — Z880 Allergy status to penicillin: Secondary | ICD-10-CM | POA: Insufficient documentation

## 2013-02-19 DIAGNOSIS — S0180XA Unspecified open wound of other part of head, initial encounter: Secondary | ICD-10-CM | POA: Diagnosis not present

## 2013-02-19 DIAGNOSIS — Z8679 Personal history of other diseases of the circulatory system: Secondary | ICD-10-CM | POA: Diagnosis not present

## 2013-02-19 DIAGNOSIS — T7411XA Adult physical abuse, confirmed, initial encounter: Secondary | ICD-10-CM | POA: Diagnosis not present

## 2013-02-19 DIAGNOSIS — S139XXA Sprain of joints and ligaments of unspecified parts of neck, initial encounter: Secondary | ICD-10-CM | POA: Diagnosis not present

## 2013-02-19 DIAGNOSIS — J45909 Unspecified asthma, uncomplicated: Secondary | ICD-10-CM | POA: Diagnosis not present

## 2013-02-19 DIAGNOSIS — S335XXA Sprain of ligaments of lumbar spine, initial encounter: Secondary | ICD-10-CM | POA: Insufficient documentation

## 2013-02-19 DIAGNOSIS — Z8669 Personal history of other diseases of the nervous system and sense organs: Secondary | ICD-10-CM | POA: Diagnosis not present

## 2013-02-19 DIAGNOSIS — E119 Type 2 diabetes mellitus without complications: Secondary | ICD-10-CM | POA: Insufficient documentation

## 2013-02-19 DIAGNOSIS — I10 Essential (primary) hypertension: Secondary | ICD-10-CM | POA: Insufficient documentation

## 2013-02-19 DIAGNOSIS — Z8709 Personal history of other diseases of the respiratory system: Secondary | ICD-10-CM | POA: Insufficient documentation

## 2013-02-19 DIAGNOSIS — Z8719 Personal history of other diseases of the digestive system: Secondary | ICD-10-CM | POA: Insufficient documentation

## 2013-02-19 DIAGNOSIS — S0181XA Laceration without foreign body of other part of head, initial encounter: Secondary | ICD-10-CM

## 2013-02-19 DIAGNOSIS — S161XXA Strain of muscle, fascia and tendon at neck level, initial encounter: Secondary | ICD-10-CM

## 2013-02-19 DIAGNOSIS — Z87442 Personal history of urinary calculi: Secondary | ICD-10-CM | POA: Insufficient documentation

## 2013-02-19 DIAGNOSIS — R011 Cardiac murmur, unspecified: Secondary | ICD-10-CM | POA: Diagnosis not present

## 2013-02-19 DIAGNOSIS — IMO0002 Reserved for concepts with insufficient information to code with codable children: Secondary | ICD-10-CM | POA: Diagnosis not present

## 2013-02-19 DIAGNOSIS — S39012A Strain of muscle, fascia and tendon of lower back, initial encounter: Secondary | ICD-10-CM

## 2013-02-19 DIAGNOSIS — Z79899 Other long term (current) drug therapy: Secondary | ICD-10-CM | POA: Insufficient documentation

## 2013-02-19 DIAGNOSIS — Z8739 Personal history of other diseases of the musculoskeletal system and connective tissue: Secondary | ICD-10-CM | POA: Insufficient documentation

## 2013-02-19 MED ORDER — TRAMADOL HCL 50 MG PO TABS: 50.0000 mg | ORAL_TABLET | Freq: Four times a day (QID) | ORAL | Status: DC | PRN

## 2013-02-19 MED ORDER — CYCLOBENZAPRINE HCL 10 MG PO TABS: 10.0000 mg | ORAL_TABLET | Freq: Two times a day (BID) | ORAL | Status: DC | PRN
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
## 2013-02-19 NOTE — ED Notes (Signed)
Patient transported to CT 

## 2013-02-20 DIAGNOSIS — G4730 Sleep apnea, unspecified: Secondary | ICD-10-CM | POA: Diagnosis not present

## 2013-02-20 DIAGNOSIS — B182 Chronic viral hepatitis C: Secondary | ICD-10-CM | POA: Diagnosis not present

## 2013-02-20 DIAGNOSIS — E119 Type 2 diabetes mellitus without complications: Secondary | ICD-10-CM | POA: Diagnosis not present

## 2013-02-20 NOTE — ED Notes (Signed)
[employer] finished.

## 2013-02-20 NOTE — ED Notes (Addendum)
[employer] here and [employer] officer to see pt

## 2013-02-20 NOTE — ED Provider Notes (Signed)
Medical screening examination/treatment/procedure(s) were performed by non-physician practitioner and as supervising physician I was immediately available for consultation/collaboration.  John-Adam Mairlyn Tegtmeyer, M.D.   John-Adam Mike Hamre, MD 02/20/13 0211 
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
## 2013-02-20 NOTE — ED Notes (Signed)
[employer] remain at bedside with pt

## 2013-02-28 DIAGNOSIS — M79609 Pain in unspecified limb: Secondary | ICD-10-CM | POA: Diagnosis not present

## 2013-02-28 DIAGNOSIS — R5381 Other malaise: Secondary | ICD-10-CM | POA: Diagnosis not present

## 2013-02-28 DIAGNOSIS — R5383 Other fatigue: Secondary | ICD-10-CM | POA: Diagnosis not present

## 2013-02-28 DIAGNOSIS — R609 Edema, unspecified: Secondary | ICD-10-CM | POA: Diagnosis not present

## 2013-03-16 ENCOUNTER — Emergency Department (EMERGENCY_DEPARTMENT_HOSPITAL)
Admission: EM | Admit: 2013-03-16 | Discharge: 2013-03-17 | Disposition: A | Discharge status: Other Acute Inpatient Hospital | Payer: Medicare Other | Source: Home / Self Care

## 2013-03-16 ENCOUNTER — Encounter (HOSPITAL_COMMUNITY): Payer: Self-pay

## 2013-03-16 DIAGNOSIS — K589 Irritable bowel syndrome without diarrhea: Secondary | ICD-10-CM | POA: Diagnosis present

## 2013-03-16 DIAGNOSIS — F329 Major depressive disorder, single episode, unspecified: Secondary | ICD-10-CM | POA: Insufficient documentation

## 2013-03-16 DIAGNOSIS — Z8709 Personal history of other diseases of the respiratory system: Secondary | ICD-10-CM | POA: Insufficient documentation

## 2013-03-16 DIAGNOSIS — Z8679 Personal history of other diseases of the circulatory system: Secondary | ICD-10-CM | POA: Insufficient documentation

## 2013-03-16 DIAGNOSIS — M129 Arthropathy, unspecified: Secondary | ICD-10-CM | POA: Insufficient documentation

## 2013-03-16 DIAGNOSIS — Z880 Allergy status to penicillin: Secondary | ICD-10-CM | POA: Insufficient documentation

## 2013-03-16 DIAGNOSIS — Z8719 Personal history of other diseases of the digestive system: Secondary | ICD-10-CM | POA: Insufficient documentation

## 2013-03-16 DIAGNOSIS — F489 Nonpsychotic mental disorder, unspecified: Secondary | ICD-10-CM | POA: Diagnosis not present

## 2013-03-16 DIAGNOSIS — Z79899 Other long term (current) drug therapy: Secondary | ICD-10-CM | POA: Insufficient documentation

## 2013-03-16 DIAGNOSIS — Z8669 Personal history of other diseases of the nervous system and sense organs: Secondary | ICD-10-CM | POA: Insufficient documentation

## 2013-03-16 DIAGNOSIS — IMO0001 Reserved for inherently not codable concepts without codable children: Secondary | ICD-10-CM | POA: Diagnosis present

## 2013-03-16 DIAGNOSIS — E119 Type 2 diabetes mellitus without complications: Secondary | ICD-10-CM | POA: Insufficient documentation

## 2013-03-16 DIAGNOSIS — F32A Depression, unspecified: Secondary | ICD-10-CM

## 2013-03-16 DIAGNOSIS — Z8619 Personal history of other infectious and parasitic diseases: Secondary | ICD-10-CM | POA: Insufficient documentation

## 2013-03-16 DIAGNOSIS — R45851 Suicidal ideations: Secondary | ICD-10-CM | POA: Insufficient documentation

## 2013-03-16 DIAGNOSIS — J45909 Unspecified asthma, uncomplicated: Secondary | ICD-10-CM | POA: Diagnosis present

## 2013-03-16 DIAGNOSIS — F332 Major depressive disorder, recurrent severe without psychotic features: Secondary | ICD-10-CM | POA: Diagnosis not present

## 2013-03-16 DIAGNOSIS — F3289 Other specified depressive episodes: Secondary | ICD-10-CM | POA: Diagnosis not present

## 2013-03-16 DIAGNOSIS — J45901 Unspecified asthma with (acute) exacerbation: Secondary | ICD-10-CM | POA: Insufficient documentation

## 2013-03-16 DIAGNOSIS — R011 Cardiac murmur, unspecified: Secondary | ICD-10-CM | POA: Insufficient documentation

## 2013-03-16 DIAGNOSIS — T50901A Poisoning by unspecified drugs, medicaments and biological substances, accidental (unintentional), initial encounter: Secondary | ICD-10-CM | POA: Diagnosis not present

## 2013-03-16 DIAGNOSIS — I10 Essential (primary) hypertension: Secondary | ICD-10-CM | POA: Insufficient documentation

## 2013-03-16 DIAGNOSIS — F411 Generalized anxiety disorder: Secondary | ICD-10-CM | POA: Insufficient documentation

## 2013-03-16 DIAGNOSIS — F29 Unspecified psychosis not due to a substance or known physiological condition: Secondary | ICD-10-CM | POA: Diagnosis not present

## 2013-03-16 DIAGNOSIS — Z8739 Personal history of other diseases of the musculoskeletal system and connective tissue: Secondary | ICD-10-CM | POA: Insufficient documentation

## 2013-03-16 DIAGNOSIS — Z87442 Personal history of urinary calculi: Secondary | ICD-10-CM | POA: Insufficient documentation

## 2013-03-16 LAB — COMPREHENSIVE METABOLIC PANEL
AST: 55 U/L — ABNORMAL HIGH (ref 0–37)
CO2: 26 mEq/L (ref 19–32)
Chloride: 104 mEq/L (ref 96–112)
Creatinine, Ser: 0.6 mg/dL (ref 0.50–1.10)
GFR calc Af Amer: >90 mL/min (ref >90)
GFR calc non Af Amer: >90 mL/min (ref >90)
Glucose, Bld: 95 mg/dL (ref 70–99)
Total Bilirubin: 0.5 mg/dL (ref 0.3–1.2)

## 2013-03-16 LAB — CBC
HCT: 42.4 % (ref 36.0–46.0)
Hemoglobin: 14.5 g/dL (ref 12.0–15.0)
MCV: 94.4 fL (ref 78.0–100.0)
Platelets: 177 10*3/uL (ref 150–400)
RBC: 4.49 MIL/uL (ref 3.87–5.11)
WBC: 8 10*3/uL (ref 4.0–10.5)

## 2013-03-16 LAB — ACETAMINOPHEN LEVEL: Acetaminophen (Tylenol), Serum: <15 ug/mL (ref 10–30)

## 2013-03-16 LAB — RAPID URINE DRUG SCREEN, HOSP PERFORMED: Tetrahydrocannabinol: NOT DETECTED

## 2013-03-16 NOTE — ED Notes (Signed)
Poison Control contacted-spoke with David/states just watch for any CNS symptoms

## 2013-03-16 NOTE — ED Provider Notes (Signed)
History    CSN: [id number] Arrival date & time 03/16/13  2042  First MD Initiated Contact with Patient 03/16/13 2047     Chief Complaint  Patient presents with  . Medical Clearance  . Suicidal   (Consider location/radiation/quality/duration/timing/severity/associated sxs/prior Treatment) The history is provided by the patient.  pt with hx anxiety and depression, c/o worsening feelings of anxiety and depression, w thoughts of self harm, suicidal thoughts. Pt indicates this evening at approximately 7 pm she took 4 ultram tablets. Denies any other ingestion. Indicates multiple recent stressors including no child support, and that children dont respect her.  Pt denies thoughts of harm to others. States physical health recently at baseline. States has rx for depression but hasnt taken in over a month.    Past Medical History  Diagnosis Date  . Fibromyalgia   . Diabetes mellitus   . Heart murmur   . Asthma   . Shortness of breath   . Headache(784.0)     migraines  . Depression   . Recurrent upper respiratory infection (URI)   . Complication of anesthesia     difficulty breathing  . Kidney stones     with hematuria  . Carpal tunnel syndrome     bilaterally  . Arthritis     RA "states does not see Rheumatologist"  . Irritable bowel syndrome   . Degenerative disk disease     back  . Kidney stone     with hematuria  . Hypertension     no medication for at this time, sees Dr. Estevan Oaks Pcp  . Hypotensive episode     hx of  . Hepatitis     In followup treatment for hepatitis C   Past Surgical History  Procedure Laterality Date  . Tubal ligation    . Dilation and curettage of uterus    . Liver biopsy    . Tonsillectomy     No family history on file. History  Substance Use Topics  . Smoking status: Never Smoker   . Smokeless tobacco: Not on file  . Alcohol Use: No   OB History   Grav Para Term Preterm Abortions TAB SAB Ect Mult Living                  Review of Systems  Constitutional: Negative for fever and chills.  HENT: Negative for neck pain.   Eyes: Negative for redness.  Respiratory: Negative for shortness of breath.   Cardiovascular: Negative for chest pain.  Gastrointestinal: Negative for abdominal pain.  Genitourinary: Negative for flank pain.  Musculoskeletal: Negative for back pain.  Skin: Negative for rash.  Neurological: Negative for weakness, numbness and headaches.  Hematological: Does not bruise/bleed easily.  Psychiatric/Behavioral: Positive for dysphoric mood.    Allergies  Anesthetic ether; Nubain; Penicillins; and Advair diskus  Home Medications   Current Outpatient Rx  Name  Route  Sig  Dispense  Refill  . metFORMIN (GLUCOPHAGE) 500 MG tablet   Oral   Take 500 mg by mouth 2 (two) times daily.         . traMADol (ULTRAM) 50 MG tablet   Oral   Take 1 tablet (50 mg total) by mouth every 6 (six) hours as needed for pain.   15 tablet   0   . acetaminophen (TYLENOL) 500 MG tablet   Oral   Take 1,000 mg by mouth every 6 (six) hours as needed for pain.         Marland Kitchen  cetirizine (ZYRTEC) 10 MG tablet   Oral   Take 10 mg by mouth daily as needed. For allergies.         . cyclobenzaprine (FLEXERIL) 10 MG tablet   Oral   Take 1 tablet (10 mg total) by mouth 2 (two) times daily as needed for muscle spasms.   20 tablet   0   . Fluticasone-Salmeterol (ADVAIR) 250-50 MCG/DOSE AEPB   Inhalation   Inhale 1 puff into the lungs every 12 (twelve) hours.         Marland Kitchen omeprazole (PRILOSEC) 40 MG capsule   Oral   Take 40 mg by mouth daily.         . phentermine 37.5 MG capsule   Oral   Take 37.5 mg by mouth every morning.         . sodium chloride (OCEAN) 0.65 % nasal spray   Nasal   Place 1 spray into the nose daily as needed. For congestion.          BP 125/64  Temp(Src) 98.7 F (37.1 C) (Oral)  Resp 12  SpO2 99% Physical Exam  Nursing note and vitals reviewed. Constitutional: She is  oriented to person, place, and time. She appears well-developed and well-nourished. No distress.  HENT:  Mouth/Throat: Oropharynx is clear and moist.  Eyes: Conjunctivae are normal. No scleral icterus.  Neck: Neck supple. No tracheal deviation present.  Cardiovascular: Normal rate, regular rhythm, normal heart sounds and intact distal pulses.   Pulmonary/Chest: Effort normal and breath sounds normal. No respiratory distress.  Abdominal: Soft. Normal appearance and bowel sounds are normal. She exhibits no distension. There is no tenderness.  Musculoskeletal: She exhibits no edema and no tenderness.  Neurological: She is alert and oriented to person, place, and time.  Skin: Skin is warm and dry. No rash noted.  Psychiatric:  Depressed mood.     ED Course  Procedures (including critical care time)   Results for orders placed during the hospital encounter of 03/16/13  CBC      Result Value Range   WBC 8.0  4.0 - 10.5 K/uL   RBC 4.49  3.87 - 5.11 MIL/uL   Hemoglobin 14.5  12.0 - 15.0 g/dL   HCT 32.4  40.1 - 02.7 %   MCV 94.4  78.0 - 100.0 fL   MCH 32.3  26.0 - 34.0 pg   MCHC 34.2  30.0 - 36.0 g/dL   RDW 25.3  66.4 - 40.3 %   Platelets 177  150 - 400 K/uL  COMPREHENSIVE METABOLIC PANEL      Result Value Range   Sodium 140  135 - 145 mEq/L   Potassium 3.7  3.5 - 5.1 mEq/L   Chloride 104  96 - 112 mEq/L   CO2 26  19 - 32 mEq/L   Glucose, Bld 95  70 - 99 mg/dL   BUN 13  6 - 23 mg/dL   Creatinine, Ser 4.74  0.50 - 1.10 mg/dL   Calcium 9.4  8.4 - 25.9 mg/dL   Total Protein 6.9  6.0 - 8.3 g/dL   Albumin 3.7  3.5 - 5.2 g/dL   AST 55 (*) 0 - 37 U/L   ALT 56 (*) 0 - 35 U/L   Alkaline Phosphatase 68  39 - 117 U/L   Total Bilirubin 0.5  0.3 - 1.2 mg/dL   GFR calc non Af Amer >90  >90 mL/min   GFR calc Af Amer >90  >90 mL/min  ETHANOL      Result Value Range   Alcohol, Ethyl (B) <11  0 - 11 mg/dL  ACETAMINOPHEN LEVEL      Result Value Range   Acetaminophen (Tylenol), Serum <15.0   10 - 30 ug/mL  SALICYLATE LEVEL      Result Value Range   Salicylate Lvl <2.0 (*) 2.8 - 20.0 mg/dL  URINE RAPID DRUG SCREEN (HOSP PERFORMED)      Result Value Range   Opiates NONE DETECTED  NONE DETECTED   Cocaine NONE DETECTED  NONE DETECTED   Benzodiazepines NONE DETECTED  NONE DETECTED   Amphetamines NONE DETECTED  NONE DETECTED   Tetrahydrocannabinol NONE DETECTED  NONE DETECTED   Barbiturates NONE DETECTED  NONE DETECTED  GLUCOSE, CAPILLARY      Result Value Range   Glucose-Capillary 92  70 - 99 mg/dL   Ct Maxillofacial Wo Cm  02/19/2013   *RADIOLOGY REPORT*  Clinical Data: Forehead laceration with glass, facial trauma  CT MAXILLOFACIAL WITHOUT CONTRAST  Technique:  Multidetector CT imaging of the maxillofacial structures was performed. Multiplanar CT image reconstructions were also generated.  Comparison: None.  Findings: Streak artifact from dental amalgam noted.  There is soft tissue laceration over the mid frontal region without underlying fracture.  Orbits and paranasal sinuses are intact.  Mandibular condyles are properly located. No radiopaque foreign body.  IMPRESSION: Soft tissue laceration over the mid frontal region, no facial bone fracture identified.  No radiopaque foreign body.   Original Report Authenticated By: Christiana Pellant, M.D.     MDM   Labs.  Reviewed nursing notes and prior charts for additional history.   Act team called.  Recheck pt conscious and alert. No drowsiness, no resp dep.  Pt moved to psych ed, awaiting psych eval/placement.     Suzi Roots, MD 03/16/13 2204

## 2013-03-16 NOTE — ED Notes (Signed)
Bed:WA14<BR> Expected date:<BR> Expected time:<BR> Means of arrival:<BR> Comments:<BR> EMS
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
## 2013-03-16 NOTE — ED Notes (Addendum)
Pt has one pair of stud earrings, blue jeans, gray bra, gray and green shirt, one gray spanx, one white and blue sandal.  Pt also has a black purse with empty bottle of tramadol, lip gloss, calculator, cell phone.  Pt also has locked up with security patient valuables envelope.

## 2013-03-16 NOTE — ED Notes (Signed)
Daughter Susan Hogan can be contacted at [phone]

## 2013-03-16 NOTE — ED Notes (Signed)
Pt states she was going to [facility] for psychiatric care-states was placed on depression meds 1 month ago, but states she could not remember to take the med.  Pt also has a rash to RLE.

## 2013-03-17 ENCOUNTER — Encounter (HOSPITAL_COMMUNITY): Payer: Self-pay | Admitting: Registered Nurse

## 2013-03-17 ENCOUNTER — Encounter (HOSPITAL_COMMUNITY): Payer: Self-pay | Admitting: *Deleted

## 2013-03-17 ENCOUNTER — Inpatient Hospital Stay (HOSPITAL_COMMUNITY)
Admission: AD | Admit: 2013-03-17 | Discharge: 2013-03-22 | DRG: 885 | Disposition: A | Discharge status: Home / Self Care | Payer: Medicare Other | Source: Intra-hospital | Attending: Psychiatry | Admitting: Psychiatry

## 2013-03-17 DIAGNOSIS — K589 Irritable bowel syndrome without diarrhea: Secondary | ICD-10-CM | POA: Diagnosis present

## 2013-03-17 DIAGNOSIS — J45909 Unspecified asthma, uncomplicated: Secondary | ICD-10-CM | POA: Diagnosis present

## 2013-03-17 DIAGNOSIS — I10 Essential (primary) hypertension: Secondary | ICD-10-CM | POA: Diagnosis present

## 2013-03-17 DIAGNOSIS — F332 Major depressive disorder, recurrent severe without psychotic features: Secondary | ICD-10-CM | POA: Diagnosis present

## 2013-03-17 DIAGNOSIS — IMO0001 Reserved for inherently not codable concepts without codable children: Secondary | ICD-10-CM | POA: Diagnosis present

## 2013-03-17 DIAGNOSIS — T424X5A Adverse effect of benzodiazepines, initial encounter: Secondary | ICD-10-CM

## 2013-03-17 DIAGNOSIS — E119 Type 2 diabetes mellitus without complications: Secondary | ICD-10-CM | POA: Diagnosis present

## 2013-03-17 DIAGNOSIS — Z79899 Other long term (current) drug therapy: Secondary | ICD-10-CM

## 2013-03-17 MED ORDER — PANTOPRAZOLE SODIUM 40 MG PO TBEC
40.0000 mg | DELAYED_RELEASE_TABLET | Freq: Every day | ORAL | Status: DC
  Administered 2013-03-17 – 2013-03-22 (×6): 40 mg via ORAL
  Filled 2013-03-17 (×9): qty 1

## 2013-03-17 MED ORDER — ACETAMINOPHEN 325 MG PO TABS: 650.0000 mg | ORAL_TABLET | ORAL | Status: DC | PRN

## 2013-03-17 MED ORDER — ACETAMINOPHEN 325 MG PO TABS: 650.0000 mg | ORAL_TABLET | Freq: Four times a day (QID) | ORAL | Status: DC | PRN

## 2013-03-17 MED ORDER — IBUPROFEN 200 MG PO TABS
600.0000 mg | ORAL_TABLET | Freq: Three times a day (TID) | ORAL | Status: DC | PRN
  Administered 2013-03-17 (×2): 600 mg via ORAL
  Filled 2013-03-17 (×2): qty 1

## 2013-03-17 MED ORDER — NICOTINE 21 MG/24HR TD PT24: 21.0000 mg | MEDICATED_PATCH | Freq: Every day | TRANSDERMAL | Status: DC

## 2013-03-17 MED ORDER — METFORMIN HCL 500 MG PO TABS
500.0000 mg | ORAL_TABLET | Freq: Two times a day (BID) | ORAL | Status: DC
  Administered 2013-03-17 – 2013-03-20 (×6): 500 mg via ORAL
  Filled 2013-03-17 (×9): qty 1

## 2013-03-17 MED ORDER — ZOLPIDEM TARTRATE 5 MG PO TABS: 5.0000 mg | ORAL_TABLET | Freq: Every evening | ORAL | Status: DC | PRN

## 2013-03-17 MED ORDER — FLUOXETINE HCL 10 MG PO CAPS
10.0000 mg | ORAL_CAPSULE | Freq: Every day | ORAL | Status: DC
  Administered 2013-03-18: 10 mg via ORAL
  Filled 2013-03-17 (×2): qty 1

## 2013-03-17 MED ORDER — MAGNESIUM HYDROXIDE 400 MG/5ML PO SUSP: 30.0000 mL | Freq: Every day | ORAL | Status: DC | PRN

## 2013-03-17 MED ORDER — CYCLOBENZAPRINE HCL 10 MG PO TABS
10.0000 mg | ORAL_TABLET | Freq: Two times a day (BID) | ORAL | Status: DC | PRN
  Administered 2013-03-18: 10 mg via ORAL

## 2013-03-17 MED ORDER — LORATADINE 10 MG PO TABS
10.0000 mg | ORAL_TABLET | Freq: Every day | ORAL | Status: DC
  Administered 2013-03-17 – 2013-03-22 (×6): 10 mg via ORAL
  Filled 2013-03-17 (×9): qty 1

## 2013-03-17 MED ORDER — LORAZEPAM 1 MG PO TABS
1.0000 mg | ORAL_TABLET | Freq: Three times a day (TID) | ORAL | Status: DC | PRN
  Administered 2013-03-18: 1 mg via ORAL
  Filled 2013-03-17: qty 1

## 2013-03-17 MED ORDER — FLUOXETINE HCL 10 MG PO CAPS
10.0000 mg | ORAL_CAPSULE | Freq: Every day | ORAL | Status: DC
  Administered 2013-03-17: 10 mg via ORAL
  Filled 2013-03-17: qty 1

## 2013-03-17 MED ORDER — CELECOXIB 200 MG PO CAPS
200.0000 mg | ORAL_CAPSULE | Freq: Two times a day (BID) | ORAL | Status: DC
  Administered 2013-03-17 – 2013-03-22 (×10): 200 mg via ORAL
  Filled 2013-03-17 (×13): qty 1
  Filled 2013-03-17: qty 2
  Filled 2013-03-17: qty 1

## 2013-03-17 MED ORDER — ONDANSETRON HCL 4 MG PO TABS: 4.0000 mg | ORAL_TABLET | Freq: Three times a day (TID) | ORAL | Status: DC | PRN

## 2013-03-17 MED ORDER — NICOTINE 21 MG/24HR TD PT24
21.0000 mg | MEDICATED_PATCH | Freq: Every day | TRANSDERMAL | Status: DC
  Administered 2013-03-18: 21 mg via TRANSDERMAL
  Filled 2013-03-17 (×4): qty 1

## 2013-03-17 MED ORDER — LORAZEPAM 1 MG PO TABS
1.0000 mg | ORAL_TABLET | Freq: Three times a day (TID) | ORAL | Status: DC | PRN
  Administered 2013-03-17: 1 mg via ORAL
  Filled 2013-03-17: qty 1
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
## 2013-03-17 NOTE — Progress Notes (Signed)
Reviewed patient orders. No CBG monitoring orders noted. Patient is diabetic and checks CBGs TID at home. Paged MD. Will continue to monitor patient for safety.
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
## 2013-03-17 NOTE — ED Notes (Signed)
Patient is resting comfortably. 
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
## 2013-03-17 NOTE — Progress Notes (Signed)
Adult Psychoeducational Group Note  Date:  03/17/2013 Time:  8:00PM Group Topic/Focus:  Wrap-Up Group:   The focus of this group is to help patients review their daily goal of treatment and discuss progress on daily workbooks.  Participation Level:  Did Not Attend   Additional Comments: Pt didn't attend group.   Bing Plume D 03/17/2013, 9:01 PM

## 2013-03-17 NOTE — [facility] Counselor (Signed)
Patient accepted to [facility] 500-2 by Alvy Beal, NP to Dr. Elsie Saas.
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
## 2013-03-17 NOTE — BH Assessment (Signed)
[facility] Assessment Progress Note      Accepted to 500 unit by Shuvon Rankin NP and assigned to bed 500-2 and can come over from [facility] after 130pm

## 2013-03-18 DIAGNOSIS — T424X5A Adverse effect of benzodiazepines, initial encounter: Secondary | ICD-10-CM

## 2013-03-18 LAB — GLUCOSE, CAPILLARY
Glucose-Capillary: 74 mg/dL (ref 70–99)
Glucose-Capillary: 122 mg/dL — ABNORMAL HIGH (ref 70–99)

## 2013-03-18 MED ORDER — SALINE NASAL SPRAY 0.65 % NA SOLN
1.0000 | Freq: Every day | NASAL | Status: DC | PRN
  Filled 2013-03-18: qty 0.5

## 2013-03-18 MED ORDER — GABAPENTIN 100 MG PO CAPS
100.0000 mg | ORAL_CAPSULE | Freq: Three times a day (TID) | ORAL | Status: DC
  Administered 2013-03-18 – 2013-03-22 (×13): 100 mg via ORAL
  Filled 2013-03-18 (×8): qty 1
  Filled 2013-03-18: qty 6
  Filled 2013-03-18 (×6): qty 1
  Filled 2013-03-18 (×2): qty 6
  Filled 2013-03-18: qty 1

## 2013-03-18 MED ORDER — LORATADINE 10 MG PO TABS
10.0000 mg | ORAL_TABLET | Freq: Every day | ORAL | Status: DC
  Filled 2013-03-18 (×2): qty 1

## 2013-03-18 MED ORDER — TRAZODONE HCL 50 MG PO TABS
50.0000 mg | ORAL_TABLET | Freq: Every evening | ORAL | Status: DC | PRN
  Administered 2013-03-18: 50 mg via ORAL
  Filled 2013-03-18: qty 1

## 2013-03-18 MED ORDER — CYCLOBENZAPRINE HCL 10 MG PO TABS: 10.0000 mg | ORAL_TABLET | Freq: Two times a day (BID) | ORAL | Status: DC | PRN

## 2013-03-18 MED ORDER — METFORMIN HCL 500 MG PO TABS
500.0000 mg | ORAL_TABLET | Freq: Two times a day (BID) | ORAL | Status: DC
  Filled 2013-03-18 (×3): qty 1

## 2013-03-18 MED ORDER — DULOXETINE HCL 20 MG PO CPEP
20.0000 mg | ORAL_CAPSULE | Freq: Two times a day (BID) | ORAL | Status: DC
  Administered 2013-03-18 – 2013-03-20 (×5): 20 mg via ORAL
  Filled 2013-03-18 (×7): qty 1

## 2013-03-18 MED ORDER — CYANOCOBALAMIN 1000 MCG/ML IJ SOLN
1000.0000 ug | INTRAMUSCULAR | Status: DC
  Filled 2013-03-18: qty 1

## 2013-03-18 NOTE — Progress Notes (Signed)
D Susan Hogan is learning to adjust to the hospital environment quickly. She is pleasant and cooperative, taking her meds as ordered by her MD and  attending her groups and remaining engaged in her recovery. She asks appropriate questions about her medications.  A  She acknowledges understanding explanations given her by this Clinical research associate and completes her am self inventory, writing she cont to have " off and on " SI within the previous 24 hrs, she rates her depression and hopelessness " 8/3" and she states her DC plan is to " try not to stress out".  R Safety is in place. POC includes continuing to foster therapeuitc relationship.

## 2013-03-18 NOTE — Progress Notes (Signed)
[facility] Group Notes:  (Nursing/MHT/Case Management/Adjunct)  Date:  03/18/2013  Time:  2000  Type of Therapy:  Psychoeducational Skills  Participation Level:  Active  Participation Quality:  Appropriate  Affect:  Blunted  Cognitive:  Appropriate  Insight:  Good  Engagement in Group:  Lacking  Modes of Intervention:  Education  Summary of Progress/Problems: The patient described her day as having been "ok". She explained that she had waited for much of the day for her family, but they never came in to see her. She did however state that the groups went well for her. Her goal for tomorrow is to get more rest.   Derrill Kay, Insiya Oshea S 03/18/2013, 9:36 PM
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
## 2013-03-18 NOTE — [facility] Suicide Risk Assessment (Addendum)
Suicide Risk Assessment  Admission Assessment     Information obtained from: Patient Demographic factors:   Current Mental Status per Physician Patient denies suicidal or homicidal ideation, hallucinations, illusions, or delusions. Patient engages with good eye contact, is able to focus adequately in a one to one setting, and has clear goal directed thoughts. Patient speaks with a natural conversational volume, rate, and tone. Anxiety was reported at 3 on a scale of 1 the least and 10 the most. Depression was reported at 6 to 8 on the same scale. Patient is oriented times 4, recent and remote memory intact. Judgement: limited by mental illness and family of alcoholics thinking Insight: limited by mental illness and family of alcoholics thinking  Loss Factors: Loss of significant relationship;Legal issues Historical Factors: Family history of mental illness or substance abuse Risk Reduction Factors: Responsible for children under 60 years of age;Sense of responsibility to family;Positive therapeutic relationship  CLINICAL FACTORS:   Depression:   Hopelessness Impulsivity  COGNITIVE FEATURES THAT CONTRIBUTE TO RISK:  Closed-mindedness Polarized thinking Thought constriction (tunnel vision)    SUICIDE RISK:   Moderate:  Frequent suicidal ideation with limited intensity, and duration, some specificity in terms of plans, no associated intent, good self-control, limited dysphoria/symptomatology, some risk factors present, and identifiable protective factors, including available and accessible social support.  PLAN OF CARE: 1 Admit for crisis management and stabilization.  Estimate length of stay is 3 to 5 days.  2. Medication management to reduce current symptoms to base line and improve the patient's overall level of functioning. Discussed using Cymbalta and Neutrontin for management of depression, anxiety, and pain in place of Ativan and Prozac  3. Treat health problems as  indicated:  4. Develop treatment plan to decrease risk of relapse upon discharge and the need for readmission.  5. Psycho-social education regarding relapse prevention and self-care.  6. Review and reinstate any pertinent home medication for other health issues where appropriate.  7. Call for Consult with [facility] for additional specialty patient services as needed.  I certify that inpatient services furnished can reasonably be expected to improve the patient's condition.  Orson Aloe, MD, [facility] 03/18/2013 10:23 AM

## 2013-03-18 NOTE — [facility] Group Notes (Signed)
[facility] Group Notes:  (Clinical Social Work)  03/18/2013   3:00-4:00PM  Summary of Progress/Problems:   The main focus of today's process group was for the patient to identify something in their life that led to their hospitalization that they would like to change, then to discuss their motivation to change.  The Stages of Change were explained to the group, then each patient identified where they are in that process.  A scaling question was used with motivation interviewing to determine the patient's current motivation to change the identified behavior (1-10, low to high). The patient expressed that she wants to change the way she sets boundaries with people in her life.  She is at the planning stage and her motivation is 10 out of 10.  Type of Therapy:  Process Group  Participation Level:  Active  Participation Quality:  Attentive  Affect:  Blunted and Depressed  Cognitive:  Appropriate and Oriented  Insight:  Developing/Improving  Engagement in Therapy:  Engaged  Modes of Intervention:  Education, Motivational Interviewing   Ambrose Mantle, LCSW 03/18/2013, 4:59 PM

## 2013-03-18 NOTE — Progress Notes (Signed)
Patient ID: Susan Hogan, female   DOB: 10/04/1960, 52 y.o.   MRN: [id number]  D: Patient with dull, flat affect endorsing depression. Pt denies SI at this time. A: Q 15 minute safety checks, encourage staff/peer interaction and group participation. Administer medications as ordered by MD. R: pt withdrawn and did not attend group session. No distress noted.
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
## 2013-03-19 LAB — GLUCOSE, CAPILLARY
Glucose-Capillary: 95 mg/dL (ref 70–99)
Glucose-Capillary: 107 mg/dL — ABNORMAL HIGH (ref 70–99)
Glucose-Capillary: 88 mg/dL (ref 70–99)

## 2013-03-19 NOTE — Progress Notes (Signed)
D Delores has slept a lot today. She says " whatever I took for sleep last night really has me hung over today.Marland Kitchenibuprofen just can't wake up". She remains sad, depressed and flat. She is disheveled, she wears blue hospital scrubs.  A She attended her groups today as scheduled. She takes her meds as ordered and has denied need for prn. She did not complete her AM self inventory as requested, although she has been urged to do so x3 by this Clinical research associate.  R Safety is in place and POC cont.Pt continuing to process  Her feelings of disrespect regarding her family and she is actively trying to establish and practice healthier coping skills,

## 2013-03-19 NOTE — Progress Notes (Signed)
Adult Psychoeducational Group Note  Date:  03/19/2013 Time:  6:48 PM  Group Topic/Focus:  Emotional Education:   The focus of this group is to discuss what feelings/emotions are, and how they are experienced.  Participation Level:  Did Not Attend  Participation Quality:  Did not attend  Affect:  Did not attend  Cognitive:  Did not attend  Insight: None  Engagement in Group:  Did not attend  Modes of Intervention:  Did not attend  Additional Comments:  Pt did not attend group. Pt stated that she did not sleep well and her medicine made her too drowsy and couldn't stay awake.  Caswell Corwin 03/19/2013, 6:48 PM

## 2013-03-19 NOTE — Progress Notes (Signed)
[facility] MD Progress Note  03/19/2013 1:01 PM Susan Hogan  MRN:  [id number] Subjective:  Patient in bed sleeping, when awakened she stated the sleep medicine she took was still effective--"too drowsy still", 9/10 depression, denied suicidal ideations and anxiety, appetite decreased, apologized for not being able to converse much due to "sleepiness".  Diagnosis:   Axis I: Major Depression, Recurrent severe Axis II: Deferred Axis III:  Past Medical History  Diagnosis Date  . Fibromyalgia   . Diabetes mellitus   . Heart murmur   . Asthma   . Shortness of breath   . Headache(784.0)     migraines  . Depression   . Recurrent upper respiratory infection (URI)   . Complication of anesthesia     difficulty breathing  . Kidney stones     with hematuria  . Carpal tunnel syndrome     bilaterally  . Arthritis     RA "states does not see Rheumatologist"  . Irritable bowel syndrome   . Degenerative disk disease     back  . Kidney stone     with hematuria  . Hypertension     no medication for at this time, sees Dr. Estevan Oaks Pcp  . Hypotensive episode     hx of  . Hepatitis     In followup treatment for hepatitis C   Axis IV: other psychosocial or environmental problems, problems related to social environment and problems with primary support group Axis V: 41-50 serious symptoms  ADL's:  Intact  Sleep: Good  Appetite:  Fair  Suicidal Ideation:  Denies Homicidal Ideation:  Denies  Psychiatric Specialty Exam: Review of Systems  Constitutional: Positive for malaise/fatigue.  HENT: Negative.   Eyes: Negative.   Respiratory: Negative.   Cardiovascular: Negative.   Gastrointestinal: Negative.   Genitourinary: Negative.   Musculoskeletal: Negative.   Skin: Negative.   Neurological: Negative.   Endo/Heme/Allergies: Negative.   Psychiatric/Behavioral: Positive for depression.    Blood pressure 116/75, pulse 68, temperature 96.9 F (36.1 C), temperature source Oral,  resp. rate 16, height 4\' 11"  (1.499 m), weight 85.276 kg (188 lb), SpO2 98.00%.Body mass index is 37.95 kg/(m^2).  General Appearance: Casual  Eye Contact::  Fair  Speech:  Slow  Volume:  Decreased  Mood:  Depressed  Affect:  Depressed  Thought Process:  Coherent  Orientation:  Full (Time, Place, and Person)  Thought Content:  WDL  Suicidal Thoughts:  No  Homicidal Thoughts:  No  Memory:  Immediate;   Fair Recent;   Fair Remote;   Fair  Judgement:  Fair  Insight:  Fair  Psychomotor Activity:  Decreased  Concentration:  Fair  Recall:  Fair  Akathisia:  No  Handed:  Right  AIMS (if indicated):     Assets:  Resilience  Sleep:  Number of Hours: 3.75   Current Medications: Current Facility-Administered Medications  Medication Dose Route Frequency Provider Last Rate Last Dose  . acetaminophen (TYLENOL) tablet 650 mg  650 mg Oral Q6H PRN Shuvon Rankin, NP      . celecoxib (CELEBREX) capsule 200 mg  200 mg Oral BID Shuvon Rankin, NP   200 mg at 03/19/13 1031  . cyanocobalamin ((VITAMIN B-12)) injection 1,000 mcg  1,000 mcg Intramuscular Q30 days Mike Craze, MD      . cyclobenzaprine (FLEXERIL) tablet 10 mg  10 mg Oral BID PRN Shuvon Rankin, NP   10 mg at 03/18/13 2115  . DULoxetine (CYMBALTA) DR capsule 20 mg  20 mg  Oral BID Mike Craze, MD   20 mg at 03/19/13 1030  . gabapentin (NEURONTIN) capsule 100 mg  100 mg Oral TID Mike Craze, MD   100 mg at 03/19/13 1031  . loratadine (CLARITIN) tablet 10 mg  10 mg Oral Daily Shuvon Rankin, NP   10 mg at 03/19/13 1031  . magnesium hydroxide (MILK OF MAGNESIA) suspension 30 mL  30 mL Oral Daily PRN Shuvon Rankin, NP      . metFORMIN (GLUCOPHAGE) tablet 500 mg  500 mg Oral BID WC Shuvon Rankin, NP   500 mg at 03/19/13 1031  . nicotine (NICODERM CQ - dosed in mg/24 hours) patch 21 mg  21 mg Transdermal Daily Shuvon Rankin, NP   21 mg at 03/18/13 0800  . pantoprazole (PROTONIX) EC tablet 40 mg  40 mg Oral Daily Shuvon Rankin, NP   40 mg  at 03/19/13 1030  . sodium chloride (OCEAN) 0.65 % nasal spray 1 spray  1 spray Nasal Daily PRN Mike Craze, MD      . traZODone (DESYREL) tablet 50 mg  50 mg Oral QHS PRN,MR X 1 Fransisca Kaufmann, NP   50 mg at 03/18/13 2115    Lab Results:  Results for orders placed during the hospital encounter of 03/17/13 (from the past 48 hour(s))  GLUCOSE, CAPILLARY     Status: Abnormal   Collection Time    03/17/13  5:00 PM      Result Value Range   Glucose-Capillary 106 (*) 70 - 99 mg/dL  GLUCOSE, CAPILLARY     Status: None   Collection Time    03/17/13  8:46 PM      Result Value Range   Glucose-Capillary 98  70 - 99 mg/dL  GLUCOSE, CAPILLARY     Status: None   Collection Time    03/18/13  6:16 AM      Result Value Range   Glucose-Capillary 93  70 - 99 mg/dL  GLUCOSE, CAPILLARY     Status: None   Collection Time    03/18/13 12:13 PM      Result Value Range   Glucose-Capillary 74  70 - 99 mg/dL  GLUCOSE, CAPILLARY     Status: Abnormal   Collection Time    03/18/13  5:24 PM      Result Value Range   Glucose-Capillary 110 (*) 70 - 99 mg/dL   Comment 1 Documented in Chart     Comment 2 Notify RN    GLUCOSE, CAPILLARY     Status: Abnormal   Collection Time    03/18/13  9:40 PM      Result Value Range   Glucose-Capillary 122 (*) 70 - 99 mg/dL  GLUCOSE, CAPILLARY     Status: None   Collection Time    03/19/13  6:20 AM      Result Value Range   Glucose-Capillary 85  70 - 99 mg/dL  GLUCOSE, CAPILLARY     Status: None   Collection Time    03/19/13 11:26 AM      Result Value Range   Glucose-Capillary 95  70 - 99 mg/dL   Comment 1 Documented in Chart     Comment 2 Notify RN      Physical Findings: AIMS: Facial and Oral Movements Muscles of Facial Expression: None, normal Lips and Perioral Area: None, normal Jaw: None, normal Tongue: None, normal,Extremity Movements Upper (arms, wrists, hands, fingers): None, normal Lower (legs, knees, ankles, toes): None, normal, Trunk  Movements Neck, shoulders, hips: None, normal, Overall Severity Severity of abnormal movements (highest score from questions above): None, normal Incapacitation due to abnormal movements: None, normal Patient's awareness of abnormal movements (rate only patient's report): No Awareness, Dental Status Current problems with teeth and/or dentures?: No Does patient usually wear dentures?: No  CIWA:    COWS:     Treatment Plan Summary: Daily contact with patient to assess and evaluate symptoms and progress in treatment Medication management  Plan:  Review of chart, vital signs, medications, and notes. 1-Individual and group therapy 2-Medication management for depression and anxiety:  Medications reviewed with the patient and she stated no untoward effects, no changes made 3-Coping skills for depression and anxiety 4-Continue crisis stabilization and management 5-Address health issues--monitoring vital signs, stable 6-Treatment plan in progress to prevent relapse of depression and anxiety  Medical Decision Making Problem Points:  Established problem, stable/improving (1) and Review of psycho-social stressors (1) Data Points:  Review of medication regiment & side effects (2)  I certify that inpatient services furnished can reasonably be expected to improve the patient's condition.   Nanine Means, PMH-NP 03/19/2013, 1:01 PM  Reviewed the information documented and agree with the treatment plan.  Geniece Akers,JANARDHAHA R. 04/25/2013 12:32 PM

## 2013-03-19 NOTE — Progress Notes (Signed)
Writer observed patient in group this evening and she participated. Patient reports that her day has been k. She reports waiting for her family to visit today but they did not come. Patient reports that her goal for tomorrow is to try and get some sleep. Patient later came to medication window and requested medication to aid with sleep and c/o her right side of her neck having spasm. She received trazadone to aid with sleep and flexeril for the neck spasm. Patient currently denies si/hi/a/v hallucinations. Support and encouragement offered, safety maintained on unit with 15 min checks, will monitor effectiveness on medication.

## 2013-03-19 NOTE — [facility] Group Notes (Signed)
[facility] Group Notes: (Clinical Social Work)   03/19/2013      Type of Therapy:  Group Therapy   Participation Level:  Did Not Attend    Ambrose Mantle, LCSW 03/19/2013, 4:48 PM

## 2013-03-19 NOTE — Progress Notes (Signed)
Psychoeducational Group Note  Date:  03/19/2013 Time: 1015 Group Topic/Focus:  Making Healthy Choices:   The focus of this group is to help patients identify negative/unhealthy choices they were using prior to admission and identify positive/healthier coping strategies to replace them upon discharge.  Participation Level:  Active  Participation Quality:  Appropriate  Affect:  Appropriate  Cognitive:  Appropriate  Insight:  Engaged  Engagement in Group:  Engaged  Additional Comments:    Rich Brave 3:28 PM. 03/19/2013

## 2013-03-19 NOTE — Progress Notes (Signed)
Goals  Group Note  Date:  03/19/2013 Time:  0915  Group Topic/Focus:  Goals Group : This group is centered on helping patients identify goals they want to work towards and helping them identify means they have available to help them achieve their goals. Participation Level:  Active  Participation Quality:  Appropriate  Affect:  Appropriate  Cognitive:  Appropriate  Insight:  Engaged  Engagement in Group:  Engaged  Additional Comments:    Rich Brave 12:41 PM. 03/19/2013

## 2013-03-20 DIAGNOSIS — F332 Major depressive disorder, recurrent severe without psychotic features: Principal | ICD-10-CM

## 2013-03-20 LAB — GLUCOSE, CAPILLARY: Glucose-Capillary: 102 mg/dL — ABNORMAL HIGH (ref 70–99)

## 2013-03-20 MED ORDER — DULOXETINE HCL 30 MG PO CPEP
30.0000 mg | ORAL_CAPSULE | Freq: Two times a day (BID) | ORAL | Status: DC
  Administered 2013-03-20 – 2013-03-22 (×4): 30 mg via ORAL
  Filled 2013-03-20: qty 1
  Filled 2013-03-20: qty 4
  Filled 2013-03-20 (×3): qty 1
  Filled 2013-03-20: qty 4
  Filled 2013-03-20 (×2): qty 1

## 2013-03-20 MED ORDER — TRAZODONE HCL 50 MG PO TABS
25.0000 mg | ORAL_TABLET | Freq: Every evening | ORAL | Status: DC | PRN
  Filled 2013-03-20: qty 3

## 2013-03-20 MED ORDER — METFORMIN HCL 500 MG PO TABS
500.0000 mg | ORAL_TABLET | Freq: Every day | ORAL | Status: DC
  Administered 2013-03-21 – 2013-03-22 (×2): 500 mg via ORAL
  Filled 2013-03-20 (×4): qty 1

## 2013-03-20 NOTE — Progress Notes (Signed)
Patient ID: Susan Hogan, female   DOB: 1961/02/12, 52 y.o.   MRN: [id number] [facility] MD Progress Note  03/20/2013 2:04 PM Susan Hogan  MRN:  [id number] Subjective:  Patient reports an improvement in mood as a result of recent medication adjustment. She feels too sedated in the mornings but is sleeping well at night. Patient is rating her level of depression at two but still appears quite flat in affect. She is still having some passive SI related to stressors. Patient stated "I call my family. They say they will visit. But nobody has showed up in three days to see me. I'm trying not to focus on that but it does bother me." The patient's self rating of depressive symptoms is not congruent with her presentation.   Diagnosis:   Axis I: Major Depression, Recurrent severe Axis II: Deferred Axis III:  Past Medical History  Diagnosis Date  . Fibromyalgia   . Diabetes mellitus   . Heart murmur   . Asthma   . Shortness of breath   . Headache(784.0)     migraines  . Depression   . Recurrent upper respiratory infection (URI)   . Complication of anesthesia     difficulty breathing  . Kidney stones     with hematuria  . Carpal tunnel syndrome     bilaterally  . Arthritis     RA "states does not see Rheumatologist"  . Irritable bowel syndrome   . Degenerative disk disease     back  . Kidney stone     with hematuria  . Hypertension     no medication for at this time, sees Dr. Estevan Oaks Pcp  . Hypotensive episode     hx of  . Hepatitis     In followup treatment for hepatitis C   Axis IV: other psychosocial or environmental problems, problems related to social environment and problems with primary support group Axis V: 41-50 serious symptoms  ADL's:  Intact  Sleep: Good  Appetite:  Fair  Suicidal Ideation:  Passive SI no plan Homicidal Ideation:  Denies  Psychiatric Specialty Exam: Review of Systems  Constitutional: Positive for malaise/fatigue.  HENT: Negative.    Eyes: Negative.   Respiratory: Negative.   Cardiovascular: Negative.   Gastrointestinal: Negative.   Genitourinary: Negative.   Musculoskeletal: Negative.   Skin: Negative.   Neurological: Negative.   Endo/Heme/Allergies: Negative.   Psychiatric/Behavioral: Positive for depression and suicidal ideas. Negative for hallucinations, memory loss and substance abuse. The patient is not nervous/anxious and does not have insomnia.     Blood pressure 89/40, pulse 123, temperature 98.1 F (36.7 C), temperature source Oral, resp. rate 18, height 4\' 11"  (1.499 m), weight 85.276 kg (188 lb), SpO2 98.00%.Body mass index is 37.95 kg/(m^2).  General Appearance: Casual  Eye Contact::  Fair  Speech:  Slow  Volume:  Decreased  Mood:  Depressed  Affect:  Depressed  Thought Process:  Coherent  Orientation:  Full (Time, Place, and Person)  Thought Content:  WDL  Suicidal Thoughts:  No  Homicidal Thoughts:  No  Memory:  Immediate;   Fair Recent;   Fair Remote;   Fair  Judgement:  Fair  Insight:  Fair  Psychomotor Activity:  Decreased  Concentration:  Fair  Recall:  Fair  Akathisia:  No  Handed:  Right  AIMS (if indicated):     Assets:  Resilience  Sleep:  Number of Hours: 6.75   Current Medications: Current Facility-Administered Medications  Medication Dose Route Frequency Provider  Last Rate Last Dose  . acetaminophen (TYLENOL) tablet 650 mg  650 mg Oral Q6H PRN Shuvon Rankin, NP      . celecoxib (CELEBREX) capsule 200 mg  200 mg Oral BID Shuvon Rankin, NP   200 mg at 03/20/13 0815  . cyanocobalamin ((VITAMIN B-12)) injection 1,000 mcg  1,000 mcg Intramuscular Q30 days Mike Craze, MD      . cyclobenzaprine (FLEXERIL) tablet 10 mg  10 mg Oral BID PRN Shuvon Rankin, NP   10 mg at 03/18/13 2115  . DULoxetine (CYMBALTA) DR capsule 20 mg  20 mg Oral BID Mike Craze, MD   20 mg at 03/20/13 0815  . gabapentin (NEURONTIN) capsule 100 mg  100 mg Oral TID Mike Craze, MD   100 mg at 03/20/13  1216  . loratadine (CLARITIN) tablet 10 mg  10 mg Oral Daily Shuvon Rankin, NP   10 mg at 03/20/13 0816  . magnesium hydroxide (MILK OF MAGNESIA) suspension 30 mL  30 mL Oral Daily PRN Shuvon Rankin, NP      . metFORMIN (GLUCOPHAGE) tablet 500 mg  500 mg Oral BID WC Shuvon Rankin, NP   500 mg at 03/20/13 0815  . pantoprazole (PROTONIX) EC tablet 40 mg  40 mg Oral Daily Shuvon Rankin, NP   40 mg at 03/20/13 0816  . sodium chloride (OCEAN) 0.65 % nasal spray 1 spray  1 spray Nasal Daily PRN Mike Craze, MD      . traZODone (DESYREL) tablet 25 mg  25 mg Oral QHS PRN,MR X 1 Fransisca Kaufmann, NP        Lab Results:  Results for orders placed during the hospital encounter of 03/17/13 (from the past 48 hour(s))  GLUCOSE, CAPILLARY     Status: Abnormal   Collection Time    03/18/13  5:24 PM      Result Value Range   Glucose-Capillary 110 (*) 70 - 99 mg/dL   Comment 1 Documented in Chart     Comment 2 Notify RN    GLUCOSE, CAPILLARY     Status: Abnormal   Collection Time    03/18/13  9:40 PM      Result Value Range   Glucose-Capillary 122 (*) 70 - 99 mg/dL  GLUCOSE, CAPILLARY     Status: None   Collection Time    03/19/13  6:20 AM      Result Value Range   Glucose-Capillary 85  70 - 99 mg/dL  GLUCOSE, CAPILLARY     Status: None   Collection Time    03/19/13 11:26 AM      Result Value Range   Glucose-Capillary 95  70 - 99 mg/dL   Comment 1 Documented in Chart     Comment 2 Notify RN    GLUCOSE, CAPILLARY     Status: Abnormal   Collection Time    03/19/13  5:20 PM      Result Value Range   Glucose-Capillary 107 (*) 70 - 99 mg/dL  GLUCOSE, CAPILLARY     Status: None   Collection Time    03/19/13  9:23 PM      Result Value Range   Glucose-Capillary 88  70 - 99 mg/dL  GLUCOSE, CAPILLARY     Status: None   Collection Time    03/20/13  6:08 AM      Result Value Range   Glucose-Capillary 98  70 - 99 mg/dL  GLUCOSE, CAPILLARY     Status: None  Collection Time    03/20/13 11:47 AM       Result Value Range   Glucose-Capillary 98  70 - 99 mg/dL    Physical Findings: AIMS: Facial and Oral Movements Muscles of Facial Expression: None, normal Lips and Perioral Area: None, normal Jaw: None, normal Tongue: None, normal,Extremity Movements Upper (arms, wrists, hands, fingers): None, normal Lower (legs, knees, ankles, toes): None, normal, Trunk Movements Neck, shoulders, hips: None, normal, Overall Severity Severity of abnormal movements (highest score from questions above): None, normal Incapacitation due to abnormal movements: None, normal Patient's awareness of abnormal movements (rate only patient's report): No Awareness, Dental Status Current problems with teeth and/or dentures?: No Does patient usually wear dentures?: No  CIWA:    COWS:     Treatment Plan Summary: Daily contact with patient to assess and evaluate symptoms and progress in treatment Medication management  Plan:  Review of chart, vital signs, medications, and notes. 1-Individual and group therapy 2-Medication management for depression and anxiety:  Medications reviewed with the patient and she stated no untoward effects. Increase Cymbalta to 30 mg bid for continued signs of depression.  3-Coping skills for depression and anxiety 4-Continue crisis stabilization and management 5-Address health issues--monitoring vital signs, stable 6-Treatment plan in progress to prevent relapse of depression and anxiety  Medical Decision Making Problem Points:  Established problem, stable/improving (1) and Review of psycho-social stressors (1) Data Points:  Review of medication regiment & side effects (2)  I certify that inpatient services furnished can reasonably be expected to improve the patient's condition.   Fransisca Kaufmann, NP-C 03/20/2013, 2:04 PM

## 2013-03-20 NOTE — Progress Notes (Signed)
Writer spoke with patient after she had attended group this evening. Patient reports having had a good day but the medication she took last night to aid with sleep had her drowsy mostly all day and she does not want any medication tonight. Patient reports no visit from her children which is hurtful and she reports some changes will be made at home concerning her kids. Writer offered patient support and encouraged her to focus on taking care of herself and taking care of her first. Patient was receptive and denies si/hi/a/v hallucinations. Safety maintained on unit, will continue to monitor.

## 2013-03-20 NOTE — [facility] Group Notes (Signed)
[facility] LCSW Group Therapy          Overcoming Obstacles       1:15 -2:30        03/20/2013  3:01 PM     Type of Therapy:  Group Therapy  Participation Level:  Did not attend  Wynn Banker 03/20/2013    3:01 PM'

## 2013-03-20 NOTE — Progress Notes (Signed)
Adult Psychoeducational Group Note  Date:  03/20/2013 Time:  11:00 AM Group Topic/Focus:  Self Care:   The focus of this group is to help patients understand the importance of self-care in order to improve or restore emotional, physical, spiritual, interpersonal, and financial health.  Participation Level:  Did Not Attend  Participation Quality:    Affect:    Cognitive:    Insight: None  Engagement in Group:  None  Modes of Intervention:     Additional Comments:  Pt did not attend this group session.  Malachy Moan 03/20/2013, 3:14 PM

## 2013-03-20 NOTE — Progress Notes (Signed)
Adult Psychoeducational Group Note  Date:  03/20/2013 Time:  8:00PM Group Topic/Focus:  Wrap-Up Group:   The focus of this group is to help patients review their daily goal of treatment and discuss progress on daily workbooks.  Participation Level:  Active  Participation Quality:  Appropriate and Attentive  Affect:  Appropriate  Cognitive:  Appropriate  Insight: Appropriate  Engagement in Group:  Engaged  Modes of Intervention:  Discussion  Additional Comments:  Pt. Was attentive and appropriate during tonight's group discussion. Pt. Stated that she was happy that her family came to visit with her today. Pt stated that she is had a ok day.   Bing Plume D 03/20/2013, 9:26 PM

## 2013-03-20 NOTE — Progress Notes (Signed)
Pt reports she is feeling better since she had a visit from her family this evening.  She has been here about three days, and no one had been to see her.  Her mood was more bright and she was smiling during our conversation.  Pt denies SI/HI/AV.  Pt voices no needs/concerns at this time.  She plans to return home at discharge.  She says she does not need a sleep aid for the night.  Pt makes her needs known to staff.  Support and encouragement offered.  Safety maintained with q15 minute checks.

## 2013-03-20 NOTE — Progress Notes (Signed)
Recreation Therapy Notes  Date: 06.30.2014 Time: 3:00pm Location: [address] Day[address]      Group Topic/Focus: Coping Skills  Participation Level: Did not attend  Ieshia Hatcher L Katalyn Matin, LRT/CTRS  Rigoberto Repass L 03/20/2013 5:28 PM 

## 2013-03-20 NOTE — Progress Notes (Signed)
Adult Psychoeducational Group Note  Date:  03/19/2013 Time:  800 pm  Group Topic/Focus:  Wrap-Up Group:   The focus of this group is to help patients review their daily goal of treatment and discuss progress on daily workbooks.  Participation Level:  Active  Participation Quality:  Appropriate  Affect:  Appropriate  Cognitive:  Appropriate  Insight: Appropriate  Engagement in Group:  Engaged  Modes of Intervention:  Discussion  Additional Comments:  Pt reported that she felt groggy today due to her sleep medication from last night. Pt reported that she had restless sleep during the night but felt sleepy most of the day.  Pt reported her goal for the following day was to continue to feel better.  Marvis Moeller A 03/20/2013, 12:26 AM

## 2013-03-20 NOTE — Progress Notes (Signed)
Patient ID: Susan Hogan, female   DOB: 24-Jan-1961, 52 y.o.   MRN: [id number] PER STATE REGULATIONS 482.30  THIS CHART WAS REVIEWED FOR MEDICAL NECESSITY WITH RESPECT TO THE PATIENT'S ADMISSION/ DURATION OF STAY.  NEXT REVIEW DATE: 03/23/2013  Willa Rough, RN, BSN CASE MANAGER

## 2013-03-20 NOTE — [facility] Group Notes (Signed)
[facility] LCSW Aftercare Discharge Planning Group Note   03/20/2013 11:20 AM  Participation Quality:  Appropriate  Mood/Affect:  Appropriate  Depression Rating:  2  Anxiety Rating:  0  Thoughts of Suicide:  No  Will you contract for safety?   Yes  Current AVH:  No  Plan for Discharge/Comments:  Patient reports admitting to hospital due to a suicide attempt.  She is not endorsing SI today.   Patient stated stressors were disagreements with adult children and her mother.  She reports having home and transportation. She is followed outpatient by [facility].  Transportation Means: Patient has transportation.   Supports:  Patient has a good support system.   Raisha Brabender, Joesph July

## 2013-03-20 NOTE — Progress Notes (Signed)
Grief and Loss Group  Patients processed their emotions and experiences related to grief and loss.   Pt left the group after about 5 minutes. She returned for a few minutes without sitting down to get coffee and then left again.   Lenoard Aden [facility]  Counseling Intern

## 2013-03-20 NOTE — Progress Notes (Signed)
Patient ID: Susan Hogan, female   DOB: 29-May-1961, 52 y.o.   MRN: [id number] D-Patient reports poor sleep last night and feels sleepy this am"after sleeping med trying to get it out of my system".  She is rating her depression an 8 and denies feeling hopeless.  A- Talked with patient about feeling dizzy when she stands and reviewed fall precautions asking patient to stand when she first gets up and to sit back down if she continues to feel dizzy.  R- patient says she will.

## 2013-03-21 LAB — GLUCOSE, CAPILLARY
Glucose-Capillary: 87 mg/dL (ref 70–99)
Glucose-Capillary: 90 mg/dL (ref 70–99)

## 2013-03-21 NOTE — [facility] Group Notes (Signed)
[facility] LCSW Aftercare Discharge Planning Group Note   03/21/2013 10:55 AM  Participation Quality:  Appropriate  Mood/Affect:  Appropriate  Depression Rating: 1 Anxiety Rating:  0  Thoughts of Suicide:  No  Will you contract for safety?   Yes  Current AVH:  No  Plan for Discharge/Comments:    Patient reports doing well and hopes to discharge home soon.  She advised of being followed by  [facility].  Transportation Means: Patient has transportation.   Supports:  Patient has a good support system.   Susan Hogan, Joesph July

## 2013-03-21 NOTE — Progress Notes (Signed)
Patient ID: Susan Hogan, female   DOB: [birth year], 52 y.o.   MRN: [id number] D- Patient reports poor sleep and poor appetite.  Her energy level is low and her ability to pay attention is improving.  She has ben attending groups.  She says that sometimes at home she forgets to take her medication.  A- supported patient and talked with her about importance of taking meds as ordered and had he problem solve ways to remember meds.  Pt is appreciative of time spent withher.

## 2013-03-21 NOTE — [facility] Group Notes (Signed)
[facility] LCSW Group Therapy      Feelings About Diagnosis 1:15 - 2:30 PM         03/21/2013  10:59 AM    Type of Therapy:  Group Therapy  Participation Level:  Active  Participation Quality:  Appropriate  Affect:  Appropriate  Cognitive:  Alert and Appropriate  Insight:  Developing/Improving and Engaged  Engagement in Therapy:  Developing/Improving and Engaged  Modes of Intervention:  Discussion, Education, Exploration, Problem-Solving, Rapport Building, Support  Summary of Progress/Problems:  Patient actively participated in group. Patient discussed past and present diagnosis and the effects it has had on  life.  Patient talked about family and society being judgmental and the stigma associated with having a mental health diagnosis.  Patient shared she has accepted her diagnosis and knows it does not effect who she is as a person.  Wynn Banker 03/21/2013  10:59 AM

## 2013-03-21 NOTE — Progress Notes (Addendum)
Recreation Therapy Notes  Date: 07.01.2014 Time: 2:45pm Location: [address] Day[address]       Group Topic/Focus: Musician (AAA/T)  Participation Level: Active  Participation Quality: Appropriate and Sharing  Affect: Euthymic to bight at times  Cognitive: Appropriate  Additional Comments: 07.01.2014 Session = AAA Session ; Dog Team = [facility] and handler  Patient pet and visited with Stuart. Patient smiled while interacting with Amelia. Patient shared stories about her dog at home. Patient brightened while talking about her pet. Patient interacted with peers, LRT and dog team appropriately.   Marykay Lex Camyah Pultz, LRT/CTRS  Jearl Klinefelter 03/21/2013 4:58 PM

## 2013-03-21 NOTE — Progress Notes (Signed)
Patient ID: Susan Hogan, female   DOB: 29-May-1961, 52 y.o.   MRN: [id number] Patient denies SI at this time but says she has been thinking about it.  Asked patient to contract for safety and she stated "Oh I wouldn't do anything here, I meant after I leave".  Talked with patient about  Mindfulness and focusing on the present.  Says she will try.
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
## 2013-03-22 MED ORDER — TRAMADOL HCL 50 MG PO TABS: 50.0000 mg | ORAL_TABLET | Freq: Four times a day (QID) | ORAL | Status: DC | PRN

## 2013-03-22 MED ORDER — DULOXETINE HCL 30 MG PO CPEP: 30.0000 mg | ORAL_CAPSULE | Freq: Two times a day (BID) | ORAL | Status: DC

## 2013-03-22 MED ORDER — METFORMIN HCL 500 MG PO TABS: 500.0000 mg | ORAL_TABLET | Freq: Two times a day (BID) | ORAL | Status: DC

## 2013-03-22 MED ORDER — TRAZODONE 25 MG HALF TABLET: 25.0000 mg | ORAL_TABLET | Freq: Every evening | ORAL | Status: DC | PRN

## 2013-03-22 MED ORDER — CYCLOBENZAPRINE HCL 10 MG PO TABS: 10.0000 mg | ORAL_TABLET | Freq: Two times a day (BID) | ORAL | Status: DC | PRN

## 2013-03-22 MED ORDER — OMEPRAZOLE 40 MG PO CPDR: 40.0000 mg | DELAYED_RELEASE_CAPSULE | Freq: Every day | ORAL | Status: DC

## 2013-03-22 MED ORDER — CELECOXIB 200 MG PO CAPS: 200.0000 mg | ORAL_CAPSULE | Freq: Two times a day (BID) | ORAL | Status: DC

## 2013-03-22 MED ORDER — GABAPENTIN 100 MG PO CAPS: 100.0000 mg | ORAL_CAPSULE | Freq: Three times a day (TID) | ORAL | Status: DC

## 2013-03-22 NOTE — Progress Notes (Signed)
Adult Psychoeducational Group Note  Date:  03/22/2013 Time:  1:51 PM  Group Topic/Focus:  Personal Choices and Values:   The focus of this group is to help patients assess and explore the importance of values in their lives, how their values affect their decisions, how they express their values and what opposes their expression.  Participation Level:  Minimal  Participation Quality:  Appropriate  Affect:  Appropriate  Cognitive:  Appropriate  Insight: Appropriate  Engagement in Group:  Limited  Modes of Intervention:  Discussion and Exploration  Additional Comments:  Susan Hogan mentioned that she's having a hard time sleeping and continues to have hot flashes. She wants to make sure to take her medications and possibly attend counseling when she leaves [facility].   [place] Shi K 03/22/2013, 1:51 PM

## 2013-03-22 NOTE — Progress Notes (Signed)
D: Patient verbalizes readiness for discharge: Denies SI/HI, is not psychotic or delusional. A: Discharge instructions read and discussed with patient. All belongings returned to pt. R: Pt verbalized understanding of discharge instructions. Signed for return of belongs. A: Escorted to the lobby.

## 2013-03-22 NOTE — Plan of Care (Signed)
Problem: Ineffective individual coping Goal: STG: Patient will participate in after care plan Patient is attending groups and engaging in discussion. Outpatient follow up to be scheduled prior to discharge.  Susan Porteous Nyima Vanacker, LCSW 03/20/2013   Outcome: Completed/Met Date Met:  03/22/13 Patient has attended groups and easily engaged in discussions.  Outpatient follow up is scheduled. Susan Conway, LCSW 03/22/2013

## 2013-03-22 NOTE — Progress Notes (Signed)
[facility] Adult Case Management Discharge Plan :  Will you be returning to the same living situation after discharge: Yes,  Patient returning to her home At discharge, do you have transportation home?:Yes,  Patient arrranged transportation home. Do you have the ability to pay for your medications:Yes,  Patient has Medicaid/Medicare  Release of information consent forms completed and in the chart;  Patient's signature needed at discharge.  Patient to Follow up at: Follow-up Information   [facility] with [facility] On 03/23/2013. (Thursday, March 23, 2013 at 11:30 AM)    Contact information:   [address] [place], [place] [address]   [phone]      Follow up with [facility] On 03/23/2013. (Please go to [facility] in clinic on Thursday, March 23, 2013 or any weekday  between 8AM-3PM for medication management)    Contact information:   [address]  [place], [place]   [address]  [phone]      Follow up with [facility]. (No one called back from [facility].  Patient referred to [facility])       Patient denies SI/HI: Patient no longer endorses SI/HI or other thought self harm  Safety Planning and Suicide Prevention discussed:  .Reviewed with all patients during discharge planning group   Olan Kurek, Joesph July 03/22/2013, 2:49 PM
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
## 2013-03-22 NOTE — [facility] Group Notes (Signed)
[facility] LCSW Aftercare Discharge Planning Group Note   03/22/2013 3:05 PM  Participation Quality:  Appropriate  Mood/Affect:  Appropriate  Depression Rating: 0  Anxiety Rating:  0  Thoughts of Suicide:  No  Will you contract for safety?   Yes  Current AVH:  No  Plan for Discharge/Comments:    Patient reports doing well and hopes to discharge home today.    Transportation Means: Patient has transportation.   Supports:  Patient has a good support system.   [name], Joesph July

## 2013-03-22 NOTE — [facility] Suicide Risk Assessment (Signed)
[facility] INPATIENT:  Family/Significant Other Suicide Prevention Education  Suicide Prevention Education:  Education Completed;Susan Hogan, [phone]; has been identified by the patient as the family member/significant other with whom the patient will be residing, and identified as the person(s) who will aid the patient in the event of a mental health crisis (suicidal ideations/suicide attempt).  With written consent from the patient, the family member/significant other has been provided the following suicide prevention education, prior to the and/or following the discharge of the patient.  The suicide prevention education provided includes the following:  Suicide risk factors  Suicide prevention and interventions  National Suicide Hotline telephone number  [facility] assessment telephone number  [facility] Emergency Assistance 911  [facility] and/or Residential Mobile Crisis Unit telephone number  Request made of family/significant other to:  Remove weapons (e.g., guns, rifles, knives), all items previously/currently identified as safety concern.  Son reports patient does not have access to guns.  Remove drugs/medications (over-the-counter, prescriptions, illicit drugs), all items previously/currently identified as a safety concern.  The family member/significant other verbalizes understanding of the suicide prevention education information provided.  The family member/significant other agrees to remove the items of safety concern listed above.  Wynn Banker 03/22/2013, 1:01 PM

## 2013-03-22 NOTE — [facility] Suicide Risk Assessment (Signed)
Suicide Risk Assessment  Discharge Assessment     Demographic Factors:  Adolescent or young adult, Caucasian, Low socioeconomic status and Unemployed  Mental Status Per Nursing Assessment::   On Admission:  Self-harm thoughts  Current Mental Status by Physician: Mental Status Examination: Patient appeared as per his stated age, casually dressed, and fairly groomed, and maintaining good eye contact. Patient has good mood and his affect was constricted. He has normal rate, rhythm, and volume of speech. Her thought process is linear and goal directed. Patient has denied suicidal, homicidal ideations, intentions or plans. Patient has no evidence of auditory or visual hallucinations, delusions, and paranoia. Patient has fair insight judgment and impulse control.  Loss Factors: Financial problems/change in socioeconomic status  Historical Factors: Prior suicide attempts, Family history of suicide and Family history of mental illness or substance abuse  Risk Reduction Factors:   Responsible for children under 36 years of age, Sense of responsibility to family, Religious beliefs about death, Living with another person, especially a relative, Positive social support, Positive therapeutic relationship and Positive coping skills or problem solving skills  Continued Clinical Symptoms:  Depression:   Anhedonia Insomnia Recent sense of peace/wellbeing Unstable or Poor Therapeutic Relationship Previous Psychiatric Diagnoses and Treatments Medical Diagnoses and Treatments/Surgeries  Cognitive Features That Contribute To Risk:  Polarized thinking    Suicide Risk:  Minimal: No identifiable suicidal ideation.  Patients presenting with no risk factors but with morbid ruminations; may be classified as minimal risk based on the severity of the depressive symptoms  Discharge Diagnoses:   AXIS I:  Major Depression, Recurrent severe AXIS II:  Deferred AXIS III:   Past Medical History  Diagnosis Date   . Fibromyalgia   . Diabetes mellitus   . Heart murmur   . Asthma   . Shortness of breath   . Headache(784.0)     migraines  . Depression   . Recurrent upper respiratory infection (URI)   . Complication of anesthesia     difficulty breathing  . Kidney stones     with hematuria  . Carpal tunnel syndrome     bilaterally  . Arthritis     RA "states does not see Rheumatologist"  . Irritable bowel syndrome   . Degenerative disk disease     back  . Kidney stone     with hematuria  . Hypertension     no medication for at this time, sees Dr. Estevan Oaks Pcp  . Hypotensive episode     hx of  . Hepatitis     In followup treatment for hepatitis C   AXIS IV:  economic problems, occupational problems, other psychosocial or environmental problems, problems related to social environment and problems with primary support group AXIS V:  60-70, mild symptoms  Plan Of Care/Follow-up recommendations:  Activity:  As tolerated Diet:  Regular  Is patient on multiple antipsychotic therapies at discharge:  No   Has Patient had three or more failed trials of antipsychotic monotherapy by history:  No  Recommended Plan for Multiple Antipsychotic Therapies: Not applicable  Sharmayne Jablon,JANARDHAHA R. 03/22/2013, 12:52 PM

## 2013-03-22 NOTE — Progress Notes (Signed)
Recreation Therapy Notes   Date: 07.02.2014 Time: 3:00pm Location: [address] Dayroom      Group Topic/Focus: Scientist, physiological, Communication  Participation Level: Minimal  Participation Quality: Appropriate  Affect: Euthymic  Cognitive: Appropriate   Additional Comments: Activity: Life Boat ; Explanation: Patients were given the following scenario: We have chartered a Radio producer for the afternoon. Halfway through our trip the boat springs a leak and we begin to sink. We are able to save ourselves, plus 8 of the following people: Darliss Cheney, Janyth Pupa, [address], Investment banker, operational, Psychologist, occupational, Personnel officer, Curator, Runner, broadcasting/film/video, female physician, nurse, priest, rabbi, ex-convict, ex-marine, Anthonette Legato  Patient arrived to group session late, upon arrival patient engaged in activity. Patient agreed with peers, but did not make suggestions of her own. Patient listened in wrap up discussion about the effect of decision making on support system.   Marykay Lex Luree Palla, LRT/CTRS  Winford Hehn L 03/22/2013 4:12 PM

## 2013-03-22 NOTE — Tx Team (Signed)
Interdisciplinary Treatment Plan Update   Date Reviewed:  03/22/2013  Time Reviewed:  3:03 PM  Progress in Treatment:   Attending groups: Yes Participating in groups: Yes Taking medication as prescribed: Yes  Tolerating medication: Yes Family/Significant other contact made: Yes, contact made with son  Patient understands diagnosis: Yes  Discussing patient identified problems/goals with staff: Yes Medical problems stabilized or resolved: Yes Denies suicidal/homicidal ideation: Yes Patient has not harmed self or others: Yes  For review of initial/current patient goals, please see plan of care.  Estimated Length of Stay:  Discharge home today  Reasons for Continued Hospitalization:   New Problems/Goals identified:    Discharge Plan or Barriers:   Home with outpatient follow up at [facility] and [place]  Additional Comments:  Patient reports doing well and ready for discharge.   Attendees:  Patient: Susan Hogan 03/22/2013 3:03 PM   Signature: Mervyn Gay, MD 03/22/2013 3:03 PM  Signature: 03/22/2013 3:03 PM  Signature: Harold Barban, RN 03/22/2013 3:03 PM  Signature:  Celesta Aver, RN 03/22/2013 3:03 PM  Signature:  Leighton Parody RN 03/22/2013 3:03 PM  Signature:  Juline Patch, LCSW 03/22/2013 3:03 PM  Signature:  Reyes Ivan, LCSW 03/22/2013 3:03 PM  Signature:  Maseta Dorley,Care Coordinator 03/22/2013 3:03 PM  Signature: Fransisca Kaufmann, [facility] 03/22/2013 3:03 PM  Signature:    Signature:    Signature:      Scribe for Treatment Team:   Juline Patch,  03/22/2013 3:03 PM

## 2013-03-27 NOTE — Progress Notes (Signed)
Patient Discharge Instructions:  After Visit Summary (AVS):   Faxed to:  03/27/13 Discharge Summary Note:   Faxed to:  03/27/13 Psychiatric Admission Assessment Note:   Faxed to:  03/27/13 Suicide Risk Assessment - Discharge Assessment:   Faxed to:  03/27/13 Faxed/Sent to the Next Level Care provider:  03/27/13 Faxed to [facility] @ [phone] Faxed to [facility] @ [phone] No documentation was sent to [facility]. Per the avs & ROI no appointment was scheduled.  Jerelene Redden, 03/27/2013, 2:35 PM

## 2013-04-14 DIAGNOSIS — F4310 Post-traumatic stress disorder, unspecified: Secondary | ICD-10-CM | POA: Diagnosis not present

## 2013-05-12 DIAGNOSIS — R5381 Other malaise: Secondary | ICD-10-CM | POA: Diagnosis not present

## 2013-05-12 DIAGNOSIS — E119 Type 2 diabetes mellitus without complications: Secondary | ICD-10-CM | POA: Diagnosis not present

## 2013-05-12 DIAGNOSIS — M79609 Pain in unspecified limb: Secondary | ICD-10-CM | POA: Diagnosis not present

## 2013-05-12 DIAGNOSIS — K644 Residual hemorrhoidal skin tags: Secondary | ICD-10-CM | POA: Diagnosis not present

## 2013-05-25 ENCOUNTER — Encounter (HOSPITAL_COMMUNITY): Payer: Self-pay | Admitting: Emergency Medicine

## 2013-05-25 DIAGNOSIS — E119 Type 2 diabetes mellitus without complications: Secondary | ICD-10-CM | POA: Diagnosis not present

## 2013-05-25 DIAGNOSIS — IMO0001 Reserved for inherently not codable concepts without codable children: Secondary | ICD-10-CM | POA: Diagnosis not present

## 2013-05-25 DIAGNOSIS — F329 Major depressive disorder, single episode, unspecified: Secondary | ICD-10-CM | POA: Insufficient documentation

## 2013-05-25 DIAGNOSIS — Z8709 Personal history of other diseases of the respiratory system: Secondary | ICD-10-CM | POA: Insufficient documentation

## 2013-05-25 DIAGNOSIS — G43909 Migraine, unspecified, not intractable, without status migrainosus: Secondary | ICD-10-CM | POA: Insufficient documentation

## 2013-05-25 DIAGNOSIS — F3289 Other specified depressive episodes: Secondary | ICD-10-CM | POA: Insufficient documentation

## 2013-05-25 DIAGNOSIS — M069 Rheumatoid arthritis, unspecified: Secondary | ICD-10-CM | POA: Insufficient documentation

## 2013-05-25 DIAGNOSIS — Z9851 Tubal ligation status: Secondary | ICD-10-CM | POA: Insufficient documentation

## 2013-05-25 DIAGNOSIS — Z87442 Personal history of urinary calculi: Secondary | ICD-10-CM | POA: Diagnosis not present

## 2013-05-25 DIAGNOSIS — R112 Nausea with vomiting, unspecified: Secondary | ICD-10-CM | POA: Diagnosis not present

## 2013-05-25 DIAGNOSIS — Z8669 Personal history of other diseases of the nervous system and sense organs: Secondary | ICD-10-CM | POA: Insufficient documentation

## 2013-05-25 DIAGNOSIS — K219 Gastro-esophageal reflux disease without esophagitis: Secondary | ICD-10-CM | POA: Diagnosis not present

## 2013-05-25 DIAGNOSIS — K921 Melena: Secondary | ICD-10-CM | POA: Insufficient documentation

## 2013-05-25 DIAGNOSIS — Z8619 Personal history of other infectious and parasitic diseases: Secondary | ICD-10-CM | POA: Diagnosis not present

## 2013-05-25 DIAGNOSIS — Z8719 Personal history of other diseases of the digestive system: Secondary | ICD-10-CM | POA: Diagnosis not present

## 2013-05-25 DIAGNOSIS — R1013 Epigastric pain: Secondary | ICD-10-CM | POA: Insufficient documentation

## 2013-05-25 DIAGNOSIS — M79609 Pain in unspecified limb: Secondary | ICD-10-CM | POA: Diagnosis not present

## 2013-05-25 DIAGNOSIS — Z880 Allergy status to penicillin: Secondary | ICD-10-CM | POA: Diagnosis not present

## 2013-05-25 DIAGNOSIS — Z8739 Personal history of other diseases of the musculoskeletal system and connective tissue: Secondary | ICD-10-CM | POA: Insufficient documentation

## 2013-05-25 DIAGNOSIS — R011 Cardiac murmur, unspecified: Secondary | ICD-10-CM | POA: Diagnosis not present

## 2013-05-25 DIAGNOSIS — Z79899 Other long term (current) drug therapy: Secondary | ICD-10-CM | POA: Diagnosis not present

## 2013-05-25 DIAGNOSIS — R5381 Other malaise: Secondary | ICD-10-CM | POA: Diagnosis not present

## 2013-05-25 DIAGNOSIS — I10 Essential (primary) hypertension: Secondary | ICD-10-CM | POA: Insufficient documentation

## 2013-05-25 DIAGNOSIS — E559 Vitamin D deficiency, unspecified: Secondary | ICD-10-CM | POA: Diagnosis not present

## 2013-05-25 DIAGNOSIS — E785 Hyperlipidemia, unspecified: Secondary | ICD-10-CM | POA: Diagnosis not present

## 2013-05-25 DIAGNOSIS — B1710 Acute hepatitis C without hepatic coma: Secondary | ICD-10-CM | POA: Diagnosis not present

## 2013-05-25 NOTE — ED Notes (Signed)
Pt. Reports mid abdominal pain /bloody loose stools onset this evening , also reports "knot " at left side of neck . Seen by PCP today given IM pain medication .

## 2013-05-26 ENCOUNTER — Emergency Department (HOSPITAL_COMMUNITY)
Admission: EM | Admit: 2013-05-26 | Discharge: 2013-05-26 | Disposition: A | Discharge status: Home / Self Care | Payer: Medicare Other | Attending: Emergency Medicine | Admitting: Emergency Medicine

## 2013-05-26 DIAGNOSIS — R1013 Epigastric pain: Secondary | ICD-10-CM

## 2013-05-26 LAB — COMPREHENSIVE METABOLIC PANEL
ALT: 83 U/L — ABNORMAL HIGH (ref 0–35)
AST: 67 U/L — ABNORMAL HIGH (ref 0–37)
Albumin: 3.5 g/dL (ref 3.5–5.2)
CO2: 24 mEq/L (ref 19–32)
Calcium: 9.1 mg/dL (ref 8.4–10.5)
Sodium: 137 mEq/L (ref 135–145)
Total Protein: 6.5 g/dL (ref 6.0–8.3)

## 2013-05-26 LAB — CBC WITH DIFFERENTIAL/PLATELET
Basophils Absolute: 0.1 10*3/uL (ref 0.0–0.1)
Basophils Relative: 1 % (ref 0–1)
Eosinophils Absolute: 0.2 10*3/uL (ref 0.0–0.7)
Eosinophils Relative: 2 % (ref 0–5)
Lymphocytes Relative: 31 % (ref 12–46)
MCHC: 35.3 g/dL (ref 30.0–36.0)
MCV: 95.3 fL (ref 78.0–100.0)
Platelets: 211 10*3/uL (ref 150–400)
RDW: 12.8 % (ref 11.5–15.5)
WBC: 9.8 10*3/uL (ref 4.0–10.5)

## 2013-05-26 LAB — URINE MICROSCOPIC-ADD ON

## 2013-05-26 LAB — URINALYSIS, ROUTINE W REFLEX MICROSCOPIC
Bilirubin Urine: NEGATIVE
Glucose, UA: NEGATIVE mg/dL
Hgb urine dipstick: NEGATIVE
Specific Gravity, Urine: 1.021 (ref 1.005–1.030)
pH: 5.5 (ref 5.0–8.0)

## 2013-05-26 MED ORDER — ONDANSETRON HCL 4 MG/2ML IJ SOLN: 4.0000 mg | Freq: Once | INTRAMUSCULAR | Status: DC

## 2013-05-26 MED ORDER — ONDANSETRON 4 MG PO TBDP
8.0000 mg | ORAL_TABLET | Freq: Once | ORAL | Status: AC
  Administered 2013-05-26: 8 mg via ORAL
  Filled 2013-05-26: qty 2

## 2013-05-26 MED ORDER — OMEPRAZOLE 20 MG PO CPDR: DELAYED_RELEASE_CAPSULE | ORAL | Status: DC

## 2013-05-26 MED ORDER — ONDANSETRON HCL 4 MG PO TABS: 4.0000 mg | ORAL_TABLET | Freq: Four times a day (QID) | ORAL | Status: DC

## 2013-05-26 MED ORDER — OXYCODONE-ACETAMINOPHEN 5-325 MG PO TABS
1.0000 | ORAL_TABLET | Freq: Once | ORAL | Status: AC
  Administered 2013-05-26: 1 via ORAL
  Filled 2013-05-26: qty 1

## 2013-05-26 MED ORDER — GI COCKTAIL ~~LOC~~
30.0000 mL | Freq: Once | ORAL | Status: AC
  Administered 2013-05-26: 30 mL via ORAL
  Filled 2013-05-26: qty 30

## 2013-05-26 MED ORDER — OXYCODONE-ACETAMINOPHEN 5-325 MG PO TABS: 1.0000 | ORAL_TABLET | Freq: Four times a day (QID) | ORAL | Status: DC | PRN

## 2013-05-26 MED ORDER — PANTOPRAZOLE SODIUM 40 MG PO TBEC
40.0000 mg | DELAYED_RELEASE_TABLET | Freq: Once | ORAL | Status: AC
  Administered 2013-05-26: 40 mg via ORAL
  Filled 2013-05-26: qty 1

## 2013-05-26 NOTE — ED Provider Notes (Signed)
CSN: [id number]     Arrival date & time 05/25/13  2331 History   First MD Initiated Contact with Patient 05/26/13 0149     Chief Complaint  Patient presents with  . Abdominal Pain  . Hematochezia   (Consider location/radiation/quality/duration/timing/severity/associated sxs/prior Treatment) HPI Comments: Pt presents with c/o epigastric pain and black stools beginning this afternoon. She was seen by her PCP earlier in the day for a 'knot' in her neck and received pain medication there. Pain is described as aching and burning. It does not radiate. It does not change with food. No history of abdominal surgeries. Patient has had nausea associated with diarrhea. No vomiting. History of remote negative colonoscopy and EGD. No history of stomach ulcers. No treatments prior to arrival. Onset of symptoms gradual. Course is constant. Nothing makes symptoms better or worse.  Patient is a 52 y.o. female presenting with abdominal pain. The history is provided by the patient and medical records.  Abdominal Pain Associated symptoms: nausea and vomiting   Associated symptoms: no chest pain, no cough, no diarrhea, no dysuria, no fever and no sore throat     Past Medical History  Diagnosis Date  . Fibromyalgia   . Diabetes mellitus   . Heart murmur   . Asthma   . Shortness of breath   . Headache(784.0)     migraines  . Depression   . Recurrent upper respiratory infection (URI)   . Complication of anesthesia     difficulty breathing  . Kidney stones     with hematuria  . Carpal tunnel syndrome     bilaterally  . Arthritis     RA "states does not see Rheumatologist"  . Irritable bowel syndrome   . Degenerative disk disease     back  . Kidney stone     with hematuria  . Hypertension     no medication for at this time, sees Dr. Estevan Oaks Pcp  . Hypotensive episode     hx of  . Hepatitis     In followup treatment for hepatitis C   Past Surgical History  Procedure Laterality Date  .  Tubal ligation    . Dilation and curettage of uterus    . Liver biopsy    . Tonsillectomy     No family history on file. History  Substance Use Topics  . Smoking status: Never Smoker   . Smokeless tobacco: Not on file  . Alcohol Use: No   OB History   Grav Para Term Preterm Abortions TAB SAB Ect Mult Living                 Review of Systems  Constitutional: Negative for fever.  HENT: Negative for sore throat and rhinorrhea.   Eyes: Negative for redness.  Respiratory: Negative for cough.   Cardiovascular: Negative for chest pain.  Gastrointestinal: Positive for nausea, vomiting, abdominal pain and blood in stool (black stool). Negative for diarrhea.  Genitourinary: Negative for dysuria.  Musculoskeletal: Negative for myalgias.  Skin: Negative for rash.  Neurological: Negative for headaches.    Allergies  Anesthetic ether; Nubain; Penicillins; and Advair diskus  Home Medications   Current Outpatient Rx  Name  Route  Sig  Dispense  Refill  . acetaminophen (TYLENOL) 500 MG tablet   Oral   Take 1,000 mg by mouth every 6 (six) hours as needed for pain.         . celecoxib (CELEBREX) 200 MG capsule   Oral  Take 1 capsule (200 mg total) by mouth 2 (two) times daily.         . cetirizine (ZYRTEC) 10 MG tablet   Oral   Take 10 mg by mouth daily as needed. For allergies.         . cyanocobalamin (,VITAMIN B-12,) 1000 MCG/ML injection   Intramuscular   Inject 1,000 mcg into the muscle every 30 (thirty) days.         . cyclobenzaprine (FLEXERIL) 10 MG tablet   Oral   Take 1 tablet (10 mg total) by mouth 2 (two) times daily as needed for muscle spasms.   20 tablet   0   . DULoxetine (CYMBALTA) 30 MG capsule   Oral   Take 1 capsule (30 mg total) by mouth 2 (two) times daily. For pain and depression.   60 capsule   0   . gabapentin (NEURONTIN) 100 MG capsule   Oral   Take 1 capsule (100 mg total) by mouth 3 (three) times daily. For mood stability and  pain.   90 capsule   0   . metFORMIN (GLUCOPHAGE) 500 MG tablet   Oral   Take 1 tablet (500 mg total) by mouth 2 (two) times daily.         Marland Kitchen omeprazole (PRILOSEC) 40 MG capsule   Oral   Take 1 capsule (40 mg total) by mouth daily.         . sodium chloride (OCEAN) 0.65 % nasal spray   Nasal   Place 1 spray into the nose daily as needed. For congestion.         . traMADol (ULTRAM) 50 MG tablet   Oral   Take 1 tablet (50 mg total) by mouth every 6 (six) hours as needed for pain.   15 tablet   0   . traZODone (DESYREL) 25 mg TABS   Oral   Take 0.5 tablets (25 mg total) by mouth at bedtime as needed and may repeat dose one time if needed for sleep. For sleep.   60 tablet   0    BP 106/71  Pulse 68  Temp(Src) 98.1 F (36.7 C) (Oral)  Resp 14  SpO2 97% Physical Exam  Nursing note and vitals reviewed. Constitutional: She appears well-developed and well-nourished.  HENT:  Head: Normocephalic and atraumatic.  Eyes: Conjunctivae are normal. Right eye exhibits no discharge. Left eye exhibits no discharge.  Neck: Normal range of motion. Neck supple.  Cardiovascular: Normal rate, regular rhythm and normal heart sounds.   Pulmonary/Chest: Effort normal and breath sounds normal.  Abdominal: Soft. Bowel sounds are normal. There is tenderness in the epigastric area. There is no rebound, no guarding and no CVA tenderness.  Neurological: She is alert.  Skin: Skin is warm and dry.  Psychiatric: She has a normal mood and affect.    ED Course  Procedures (including critical care time) Labs Review Labs Reviewed  COMPREHENSIVE METABOLIC PANEL - Abnormal; Notable for the following:    Glucose, Bld 114 (*)    AST 67 (*)    ALT 83 (*)    All other components within normal limits  URINALYSIS, ROUTINE W REFLEX MICROSCOPIC - Abnormal; Notable for the following:    Leukocytes, UA MODERATE (*)    All other components within normal limits  URINE MICROSCOPIC-ADD ON - Abnormal;  Notable for the following:    Squamous Epithelial / LPF FEW (*)    All other components within normal limits  URINE  CULTURE  CBC WITH DIFFERENTIAL   Imaging Review No results found.  2:08 AM Patient seen and examined. Work-up initiated. Medications ordered.   Vital signs reviewed and are as follows: Filed Vitals:   05/25/13 2345  BP: 106/71  Pulse: 68  Temp: 98.1 F (36.7 C)  Resp: 14   Rectal exam performed with nurse tech chaperone.   Patient discussed with and seen by Dr. Elesa Massed.   Pt encouraged to continue PPI, f/u with PCP/GI. She states she is going to try to see Dr. Mayford Knife today.   The patient was urged to return to the Emergency Department immediately with worsening of current symptoms, worsening abdominal pain, persistent vomiting, worsening blood noted in stools, fever, or any other concerns. The patient verbalized understanding.     MDM   1. Epigastric pain    Pt with epigastric pain, positive Hemoccult. Pain is mild and controlled in emergency department. Labs are reassuring. Do not feel patient needs additional imaging at this time. Do not suspect cholecystitis. Doubt cholelithiasis however this is possible. Do not suspect pancreatitis. Symptoms are most consistent with gastritis or PUD. Symptoms are mild and not consistent with ruptured viscus. Transaminitis at baseline (hepatitis C). Patient is stable for discharge home with followup. She may need EGD in future to delineate cause of bleeding.    Renne Crigler, PA-C 05/26/13 0615  Renne Crigler, PA-C 05/27/13 1552

## 2013-05-26 NOTE — ED Notes (Signed)
Pt. Refuses andy pain medication at this time; wants the zofran.

## 2013-05-27 LAB — URINE CULTURE
Colony Count: NO GROWTH
Culture: NO GROWTH

## 2013-05-29 NOTE — ED Provider Notes (Signed)
Medical screening examination/treatment/procedure(s) were conducted as a shared visit with non-physician practitioner(s) and myself.  I personally evaluated the patient during the encounter and agree with physical exam and plan of care.  Patient is a 52 year old female with a history of diabetes, hypertension, fibromyalgia who presents emergency department with mid and epigastric abdominal pain With nausea and black stools that started yesterday. Her black stools have resolved. No melena on exam. No bright blood per rectum. No prior history of peptic ulcer disease, pancreatitis, gastritis. She denies any heavy NSAID use or alcohol use. No prior history of endoscopy. She denies any chest pain or shortness of breath. Patient is hemodynamically stable with unremarkable labs. Patient is mildly tender to palpation in her epigastric region on examination. Will attempt to control symptoms with PPI, a GI cocktail and discharged home with close outpatient followup. Given return precautions. Patient is comfortable with this plan.  Susan Maw Ward, DO 05/29/13 2019

## 2013-06-09 ENCOUNTER — Institutional Professional Consult (permissible substitution): Payer: Medicare Other | Admitting: Diagnostic Neuroimaging

## 2013-06-19 ENCOUNTER — Encounter (HOSPITAL_COMMUNITY): Payer: Self-pay | Admitting: *Deleted

## 2013-06-19 DIAGNOSIS — R51 Headache: Secondary | ICD-10-CM | POA: Diagnosis not present

## 2013-06-19 DIAGNOSIS — J189 Pneumonia, unspecified organism: Secondary | ICD-10-CM | POA: Insufficient documentation

## 2013-06-19 DIAGNOSIS — Z79899 Other long term (current) drug therapy: Secondary | ICD-10-CM | POA: Insufficient documentation

## 2013-06-19 DIAGNOSIS — R509 Fever, unspecified: Secondary | ICD-10-CM | POA: Diagnosis not present

## 2013-06-19 DIAGNOSIS — J45909 Unspecified asthma, uncomplicated: Secondary | ICD-10-CM | POA: Insufficient documentation

## 2013-06-19 DIAGNOSIS — E119 Type 2 diabetes mellitus without complications: Secondary | ICD-10-CM | POA: Diagnosis not present

## 2013-06-19 DIAGNOSIS — M129 Arthropathy, unspecified: Secondary | ICD-10-CM | POA: Insufficient documentation

## 2013-06-19 DIAGNOSIS — F329 Major depressive disorder, single episode, unspecified: Secondary | ICD-10-CM | POA: Diagnosis not present

## 2013-06-19 DIAGNOSIS — IMO0002 Reserved for concepts with insufficient information to code with codable children: Secondary | ICD-10-CM | POA: Diagnosis not present

## 2013-06-19 DIAGNOSIS — Z880 Allergy status to penicillin: Secondary | ICD-10-CM | POA: Diagnosis not present

## 2013-06-19 DIAGNOSIS — IMO0001 Reserved for inherently not codable concepts without codable children: Secondary | ICD-10-CM | POA: Diagnosis not present

## 2013-06-19 DIAGNOSIS — F4310 Post-traumatic stress disorder, unspecified: Secondary | ICD-10-CM | POA: Diagnosis not present

## 2013-06-19 DIAGNOSIS — F3289 Other specified depressive episodes: Secondary | ICD-10-CM | POA: Insufficient documentation

## 2013-06-19 NOTE — ED Notes (Signed)
Pt vomiting over the wkend; history of bronchitis; c/o continued cough; chills

## 2013-06-20 ENCOUNTER — Emergency Department (HOSPITAL_COMMUNITY)
Admission: EM | Admit: 2013-06-20 | Discharge: 2013-06-20 | Disposition: A | Discharge status: Home / Self Care | Payer: Medicare Other | Attending: Emergency Medicine | Admitting: Emergency Medicine

## 2013-06-20 ENCOUNTER — Emergency Department (HOSPITAL_COMMUNITY): Payer: Medicare Other

## 2013-06-20 DIAGNOSIS — J189 Pneumonia, unspecified organism: Secondary | ICD-10-CM | POA: Diagnosis not present

## 2013-06-20 LAB — GLUCOSE, CAPILLARY

## 2013-06-20 MED ORDER — ACETAMINOPHEN 325 MG PO TABS
650.0000 mg | ORAL_TABLET | Freq: Once | ORAL | Status: AC
  Administered 2013-06-20: 650 mg via ORAL

## 2013-06-20 MED ORDER — AZITHROMYCIN 250 MG PO TABS
500.0000 mg | ORAL_TABLET | Freq: Once | ORAL | Status: AC
  Administered 2013-06-20: 500 mg via ORAL

## 2013-06-20 MED ORDER — AZITHROMYCIN 250 MG PO TABS: 250.0000 mg | ORAL_TABLET | Freq: Every day | ORAL | Status: DC

## 2013-06-20 MED ORDER — ACETAMINOPHEN 325 MG PO TABS
ORAL_TABLET | ORAL | Status: AC
  Administered 2013-06-20: 650 mg via ORAL
  Filled 2013-06-20: qty 2

## 2013-06-20 MED ORDER — AZITHROMYCIN 250 MG PO TABS
ORAL_TABLET | ORAL | Status: AC
  Administered 2013-06-20: 500 mg via ORAL
  Filled 2013-06-20: qty 2

## 2013-06-20 NOTE — ED Provider Notes (Signed)
CSN: [id number]     Arrival date & time 06/19/13  2203 History   First MD Initiated Contact with Patient 06/20/13 0135     Chief Complaint  Patient presents with  . Cough   (Consider location/radiation/quality/duration/timing/severity/associated sxs/prior Treatment) HPI Comments: Report she's had cough, myalgias, chills, for the past 3, days Other than her routine medications.  She has not taken any symptom relief  Patient is a 52 y.o. female presenting with cough. The history is provided by the patient.  Cough Cough characteristics:  Non-productive Severity:  Moderate Onset quality:  Gradual Timing:  Intermittent Progression:  Worsening Chronicity:  New Relieved by:  Nothing Worsened by:  Deep breathing Ineffective treatments:  None tried Associated symptoms: chills, fever and headaches   Associated symptoms: no chest pain, no rash, no rhinorrhea, no shortness of breath and no wheezing   Fever:    Duration:  3 days   Timing:  Intermittent   Progression:  Worsening   Past Medical History  Diagnosis Date  . Fibromyalgia   . Diabetes mellitus   . Heart murmur   . Asthma   . Shortness of breath   . Headache(784.0)     migraines  . Depression   . Recurrent upper respiratory infection (URI)   . Complication of anesthesia     difficulty breathing  . Kidney stones     with hematuria  . Carpal tunnel syndrome     bilaterally  . Arthritis     RA "states does not see Rheumatologist"  . Irritable bowel syndrome   . Degenerative disk disease     back  . Kidney stone     with hematuria  . Hypertension     no medication for at this time, sees Dr. Estevan Oaks Pcp  . Hypotensive episode     hx of  . Hepatitis     In followup treatment for hepatitis C   Past Surgical History  Procedure Laterality Date  . Tubal ligation    . Dilation and curettage of uterus    . Liver biopsy    . Tonsillectomy     No family history on file. History  Substance Use Topics  .  Smoking status: Never Smoker   . Smokeless tobacco: Not on file  . Alcohol Use: No   OB History   Grav Para Term Preterm Abortions TAB SAB Ect Mult Living                 Review of Systems  Constitutional: Positive for fever and chills.  HENT: Negative for congestion and rhinorrhea.   Respiratory: Positive for cough. Negative for shortness of breath and wheezing.   Cardiovascular: Negative for chest pain.  Gastrointestinal: Negative for nausea.  Skin: Negative for rash.  Neurological: Positive for headaches.  All other systems reviewed and are negative.    Allergies  Anesthetic ether; Nubain; Penicillins; and Advair diskus  Home Medications   Current Outpatient Rx  Name  Route  Sig  Dispense  Refill  . celecoxib (CELEBREX) 200 MG capsule   Oral   Take 1 capsule (200 mg total) by mouth 2 (two) times daily.         . cyanocobalamin (,VITAMIN B-12,) 1000 MCG/ML injection   Intramuscular   Inject 1,000 mcg into the muscle every 30 (thirty) days.         . hydrochlorothiazide (HYDRODIURIL) 25 MG tablet   Oral   Take 25 mg by mouth daily.         Marland Kitchen  Ibuprofen-Diphenhydramine Cit (ADVIL PM) 200-38 MG TABS   Oral   Take 1 tablet by mouth every 6 (six) hours as needed (sleep).         . metFORMIN (GLUCOPHAGE) 500 MG tablet   Oral   Take 1 tablet (500 mg total) by mouth 2 (two) times daily.         Marland Kitchen omeprazole (PRILOSEC) 20 MG capsule      Take one capsule PO once a day   30 capsule   0   . ondansetron (ZOFRAN) 4 MG tablet   Oral   Take 1 tablet (4 mg total) by mouth every 6 (six) hours.   12 tablet   0   . oxyCODONE-acetaminophen (PERCOCET/ROXICET) 5-325 MG per tablet   Oral   Take 1-2 tablets by mouth every 6 (six) hours as needed for pain.   10 tablet   0   . azithromycin (ZITHROMAX) 250 MG tablet   Oral   Take 1 tablet (250 mg total) by mouth daily.   4 tablet   0    BP 133/83  Pulse 83  Temp(Src) 100.1 F (37.8 C)  SpO2 98% Physical  Exam  Nursing note and vitals reviewed. Constitutional: She appears well-developed and well-nourished.  Eyes: Pupils are equal, round, and reactive to light.  Neck: Normal range of motion.  Cardiovascular: Normal rate.   Pulmonary/Chest: Effort normal. No respiratory distress. She has no wheezes. She exhibits no tenderness.  Musculoskeletal: Normal range of motion.  Neurological: She is alert.  Skin: Skin is warm. No erythema.    ED Course  Procedures (including critical care time) Labs Review Labs Reviewed  GLUCOSE, CAPILLARY - Abnormal; Notable for the following:    Glucose-Capillary 101 (*)    All other components within normal limits   Imaging Review Dg Chest 2 View  06/20/2013   CLINICAL DATA:  Cough, fever.  EXAM: CHEST  2 VIEW  COMPARISON:  None  FINDINGS: Consolidation in the left lower lobe concerning for pneumonia. Right lung is clear. Heart is normal size. No effusions.  IMPRESSION: Left lower lobe pneumonia.   Electronically Signed   By: Charlett Nose M.D.   On: 06/20/2013 02:15    MDM   1. CAP (community acquired pneumonia)     X-ray, reviewed.  Patient is positive for left lower lobe pneumonia.  She will be treated with azithromycin.  She is to followup with her primary care physician is    Arman Filter, NP 06/20/13 [phone]

## 2013-06-20 NOTE — ED Provider Notes (Signed)
Medical screening examination/treatment/procedure(s) were performed by non-physician practitioner and as supervising physician I was immediately available for consultation/collaboration.  Flint Melter, MD 06/20/13 [phone]

## 2013-06-23 ENCOUNTER — Ambulatory Visit: Payer: Medicare Other | Admitting: Diagnostic Neuroimaging

## 2013-06-29 DIAGNOSIS — F4310 Post-traumatic stress disorder, unspecified: Secondary | ICD-10-CM | POA: Diagnosis not present

## 2013-06-30 DIAGNOSIS — J189 Pneumonia, unspecified organism: Secondary | ICD-10-CM | POA: Diagnosis not present

## 2013-06-30 DIAGNOSIS — E119 Type 2 diabetes mellitus without complications: Secondary | ICD-10-CM | POA: Diagnosis not present

## 2013-06-30 DIAGNOSIS — R5381 Other malaise: Secondary | ICD-10-CM | POA: Diagnosis not present

## 2013-06-30 DIAGNOSIS — I10 Essential (primary) hypertension: Secondary | ICD-10-CM | POA: Diagnosis not present

## 2013-06-30 DIAGNOSIS — R5383 Other fatigue: Secondary | ICD-10-CM | POA: Diagnosis not present

## 2013-07-06 DIAGNOSIS — F4310 Post-traumatic stress disorder, unspecified: Secondary | ICD-10-CM | POA: Diagnosis not present

## 2013-07-13 DIAGNOSIS — E119 Type 2 diabetes mellitus without complications: Secondary | ICD-10-CM | POA: Diagnosis not present

## 2013-07-13 DIAGNOSIS — R05 Cough: Secondary | ICD-10-CM | POA: Diagnosis not present

## 2013-07-13 DIAGNOSIS — J189 Pneumonia, unspecified organism: Secondary | ICD-10-CM | POA: Diagnosis not present

## 2013-07-13 DIAGNOSIS — J45909 Unspecified asthma, uncomplicated: Secondary | ICD-10-CM | POA: Diagnosis not present

## 2013-07-13 DIAGNOSIS — R059 Cough, unspecified: Secondary | ICD-10-CM | POA: Diagnosis not present

## 2013-07-14 DIAGNOSIS — F4310 Post-traumatic stress disorder, unspecified: Secondary | ICD-10-CM | POA: Diagnosis not present

## 2013-07-21 DIAGNOSIS — F4310 Post-traumatic stress disorder, unspecified: Secondary | ICD-10-CM | POA: Diagnosis not present

## 2013-07-23 DIAGNOSIS — F4310 Post-traumatic stress disorder, unspecified: Secondary | ICD-10-CM | POA: Diagnosis not present

## 2013-07-28 DIAGNOSIS — F4310 Post-traumatic stress disorder, unspecified: Secondary | ICD-10-CM | POA: Diagnosis not present

## 2013-07-29 DIAGNOSIS — R5381 Other malaise: Secondary | ICD-10-CM | POA: Diagnosis not present

## 2013-07-29 DIAGNOSIS — J019 Acute sinusitis, unspecified: Secondary | ICD-10-CM | POA: Diagnosis not present

## 2013-07-29 DIAGNOSIS — E119 Type 2 diabetes mellitus without complications: Secondary | ICD-10-CM | POA: Diagnosis not present

## 2013-07-29 DIAGNOSIS — I10 Essential (primary) hypertension: Secondary | ICD-10-CM | POA: Diagnosis not present

## 2013-07-29 DIAGNOSIS — R5383 Other fatigue: Secondary | ICD-10-CM | POA: Diagnosis not present

## 2013-08-04 DIAGNOSIS — F4310 Post-traumatic stress disorder, unspecified: Secondary | ICD-10-CM | POA: Diagnosis not present

## 2013-08-05 DIAGNOSIS — F4310 Post-traumatic stress disorder, unspecified: Secondary | ICD-10-CM | POA: Diagnosis not present

## 2013-09-07 DIAGNOSIS — R079 Chest pain, unspecified: Secondary | ICD-10-CM | POA: Diagnosis not present

## 2013-09-07 DIAGNOSIS — K219 Gastro-esophageal reflux disease without esophagitis: Secondary | ICD-10-CM | POA: Diagnosis not present

## 2013-09-07 DIAGNOSIS — I10 Essential (primary) hypertension: Secondary | ICD-10-CM | POA: Diagnosis not present

## 2013-09-07 DIAGNOSIS — E119 Type 2 diabetes mellitus without complications: Secondary | ICD-10-CM | POA: Diagnosis not present

## 2013-09-07 DIAGNOSIS — R5381 Other malaise: Secondary | ICD-10-CM | POA: Diagnosis not present

## 2013-09-07 DIAGNOSIS — N390 Urinary tract infection, site not specified: Secondary | ICD-10-CM | POA: Diagnosis not present

## 2013-09-07 DIAGNOSIS — J209 Acute bronchitis, unspecified: Secondary | ICD-10-CM | POA: Diagnosis not present

## 2013-11-24 ENCOUNTER — Emergency Department (HOSPITAL_COMMUNITY)
Admission: EM | Admit: 2013-11-24 | Discharge: 2013-11-24 | Disposition: A | Discharge status: Home / Self Care | Payer: Medicare Other | Attending: Emergency Medicine | Admitting: Emergency Medicine

## 2013-11-24 ENCOUNTER — Emergency Department (HOSPITAL_COMMUNITY): Payer: Medicare Other

## 2013-11-24 ENCOUNTER — Encounter (HOSPITAL_COMMUNITY): Payer: Self-pay | Admitting: Emergency Medicine

## 2013-11-24 DIAGNOSIS — Z87442 Personal history of urinary calculi: Secondary | ICD-10-CM | POA: Insufficient documentation

## 2013-11-24 DIAGNOSIS — Z8719 Personal history of other diseases of the digestive system: Secondary | ICD-10-CM | POA: Diagnosis not present

## 2013-11-24 DIAGNOSIS — Z8669 Personal history of other diseases of the nervous system and sense organs: Secondary | ICD-10-CM | POA: Insufficient documentation

## 2013-11-24 DIAGNOSIS — IMO0002 Reserved for concepts with insufficient information to code with codable children: Secondary | ICD-10-CM | POA: Diagnosis not present

## 2013-11-24 DIAGNOSIS — Z880 Allergy status to penicillin: Secondary | ICD-10-CM | POA: Diagnosis not present

## 2013-11-24 DIAGNOSIS — Z8619 Personal history of other infectious and parasitic diseases: Secondary | ICD-10-CM | POA: Insufficient documentation

## 2013-11-24 DIAGNOSIS — Z791 Long term (current) use of non-steroidal anti-inflammatories (NSAID): Secondary | ICD-10-CM | POA: Insufficient documentation

## 2013-11-24 DIAGNOSIS — R011 Cardiac murmur, unspecified: Secondary | ICD-10-CM | POA: Insufficient documentation

## 2013-11-24 DIAGNOSIS — Z8659 Personal history of other mental and behavioral disorders: Secondary | ICD-10-CM | POA: Diagnosis not present

## 2013-11-24 DIAGNOSIS — J45909 Unspecified asthma, uncomplicated: Secondary | ICD-10-CM | POA: Insufficient documentation

## 2013-11-24 DIAGNOSIS — R079 Chest pain, unspecified: Secondary | ICD-10-CM | POA: Insufficient documentation

## 2013-11-24 DIAGNOSIS — E119 Type 2 diabetes mellitus without complications: Secondary | ICD-10-CM | POA: Diagnosis not present

## 2013-11-24 DIAGNOSIS — I10 Essential (primary) hypertension: Secondary | ICD-10-CM | POA: Diagnosis not present

## 2013-11-24 DIAGNOSIS — M069 Rheumatoid arthritis, unspecified: Secondary | ICD-10-CM | POA: Diagnosis not present

## 2013-11-24 DIAGNOSIS — R0602 Shortness of breath: Secondary | ICD-10-CM | POA: Diagnosis not present

## 2013-11-24 DIAGNOSIS — Z79899 Other long term (current) drug therapy: Secondary | ICD-10-CM | POA: Insufficient documentation

## 2013-11-24 LAB — BASIC METABOLIC PANEL
BUN: 13 mg/dL (ref 6–23)
CALCIUM: 9.5 mg/dL (ref 8.4–10.5)
CHLORIDE: 104 meq/L (ref 96–112)
CO2: 29 meq/L (ref 19–32)
Creatinine, Ser: 0.65 mg/dL (ref 0.50–1.10)
GFR calc Af Amer: >90 mL/min (ref >90)
GFR calc non Af Amer: >90 mL/min (ref >90)
GLUCOSE: 77 mg/dL (ref 70–99)
Potassium: 4.4 mEq/L (ref 3.7–5.3)
Sodium: 143 mEq/L (ref 137–147)

## 2013-11-24 LAB — CBC
HCT: 42.5 % (ref 36.0–46.0)
HEMOGLOBIN: 14.5 g/dL (ref 12.0–15.0)
MCH: 33 pg (ref 26.0–34.0)
MCHC: 34.1 g/dL (ref 30.0–36.0)
MCV: 96.8 fL (ref 78.0–100.0)
PLATELETS: 207 10*3/uL (ref 150–400)
RBC: 4.39 MIL/uL (ref 3.87–5.11)
RDW: 12.8 % (ref 11.5–15.5)
WBC: 9.9 10*3/uL (ref 4.0–10.5)

## 2013-11-24 LAB — I-STAT TROPONIN, ED
TROPONIN I, POC: 0 ng/mL (ref 0.00–0.08)
Troponin i, poc: 0 ng/mL (ref 0.00–0.08)

## 2013-11-24 MED ORDER — HYDROCODONE-ACETAMINOPHEN 5-325 MG PO TABS: 1.0000 | ORAL_TABLET | Freq: Four times a day (QID) | ORAL | Status: DC | PRN

## 2013-11-24 MED ORDER — MORPHINE SULFATE 4 MG/ML IJ SOLN
4.0000 mg | Freq: Once | INTRAMUSCULAR | Status: AC
  Administered 2013-11-24: 4 mg via INTRAVENOUS
  Filled 2013-11-24: qty 1

## 2013-11-24 NOTE — Discharge Instructions (Signed)
Your  testing here today, was normal.  I have given your cardiologist to follow up with for further evaluation.  Return here as needed.

## 2013-11-24 NOTE — ED Notes (Signed)
Per pt, chest pain since 8 am.  No pain down either arm.  Pt states she had same in December and MD told her possible slight MI d/t stress.  Pt claims no cough or congestion.  History of heartburn but does not take meds now.  No new exercise.

## 2013-11-24 NOTE — ED Provider Notes (Signed)
CSN: [id number]     Arrival date & time 11/24/13  1337 History   First MD Initiated Contact with Patient 11/24/13 1559     Chief Complaint  Patient presents with  . Chest Pain     (Consider location/radiation/quality/duration/timing/severity/associated sxs/prior Treatment) HPI The patient presents to the emergency department with chest pain, that started at 8 AM.  The patient, states, that the pain will last for 2 seconds and is sharp in nature.  Patient denies shortness of breath, nausea, vomiting, diaphoresis, back pain, neck pain, weakness, dizziness, fever, cough, rash, headache, blurred vision, or syncope.  Patient, states, that she was seen by an urgent care Dr. and states, that she may have had a slight heart attack.  She cannot tell me how he arrived at this conclusion.  She states that he was going to have her followup with a cardiologist not send her to the hospital. Past Medical History  Diagnosis Date  . Fibromyalgia   . Diabetes mellitus   . Heart murmur   . Asthma   . Shortness of breath   . Headache(784.0)     migraines  . Depression   . Recurrent upper respiratory infection (URI)   . Complication of anesthesia     difficulty breathing  . Kidney stones     with hematuria  . Carpal tunnel syndrome     bilaterally  . Arthritis     RA "states does not see Rheumatologist"  . Irritable bowel syndrome   . Degenerative disk disease     back  . Kidney stone     with hematuria  . Hypertension     no medication for at this time, sees Dr. Estevan Oaks Pcp  . Hypotensive episode     hx of  . Hepatitis     In followup treatment for hepatitis C   Past Surgical History  Procedure Laterality Date  . Tubal ligation    . Dilation and curettage of uterus    . Liver biopsy    . Tonsillectomy     History reviewed. No pertinent family history. History  Substance Use Topics  . Smoking status: Never Smoker   . Smokeless tobacco: Not on file  . Alcohol Use: No   OB  History   Grav Para Term Preterm Abortions TAB SAB Ect Mult Living                 Review of Systems  All other systems negative except as documented in the HPI. All pertinent positives and negatives as reviewed in the HPI.   Allergies  Anesthetic ether; Nubain; Penicillins; Advair diskus; and Lactose intolerance (gi)  Home Medications   Current Outpatient Rx  Name  Route  Sig  Dispense  Refill  . albuterol (PROVENTIL HFA;VENTOLIN HFA) 108 (90 BASE) MCG/ACT inhaler   Inhalation   Inhale 2 puffs into the lungs every 4 (four) hours as needed for wheezing or shortness of breath.         . celecoxib (CELEBREX) 200 MG capsule   Oral   Take 1 capsule (200 mg total) by mouth 2 (two) times daily.         . cyanocobalamin (,VITAMIN B-12,) 1000 MCG/ML injection   Intramuscular   Inject 1,000 mcg into the muscle every 30 (thirty) days.         . hydrochlorothiazide (HYDRODIURIL) 25 MG tablet   Oral   Take 25 mg by mouth daily.         Marland Kitchen  ibuprofen (ADVIL,MOTRIN) 200 MG tablet   Oral   Take 400 mg by mouth every 4 (four) hours as needed for headache.         . metFORMIN (GLUCOPHAGE) 500 MG tablet   Oral   Take 1 tablet (500 mg total) by mouth 2 (two) times daily.         Marland Kitchen omeprazole (PRILOSEC) 20 MG capsule   Oral   Take 20 mg by mouth daily.         Marland Kitchen tetrahydrozoline (VISINE) 0.05 % ophthalmic solution   Both Eyes   Place 2 drops into both eyes daily as needed.          BP 127/79  Pulse 65  Temp(Src) 98.3 F (36.8 C) (Oral)  Resp 21  SpO2 96% Physical Exam  Nursing note and vitals reviewed. Constitutional: She is oriented to person, place, and time. She appears well-developed and well-nourished. No distress.  HENT:  Head: Normocephalic and atraumatic.  Mouth/Throat: Oropharynx is clear and moist.  Eyes: Pupils are equal, round, and reactive to light.  Neck: Normal range of motion. Neck supple.  Cardiovascular: Normal rate, regular rhythm and  normal heart sounds.  Exam reveals no gallop and no friction rub.   No murmur heard. Pulmonary/Chest: Effort normal and breath sounds normal. No respiratory distress. She has no wheezes. She has no rales.  Neurological: She is alert and oriented to person, place, and time.  Skin: Skin is warm and dry.    ED Course  Procedures (including critical care time) Labs Review Labs Reviewed  CBC  BASIC METABOLIC PANEL  I-STAT TROPOININ, ED  Rosezena Sensor, ED   Imaging Review Dg Chest 2 View  11/24/2013   CLINICAL DATA:  Shortness of breath and chest pain  EXAM: CHEST  2 VIEW  COMPARISON:  June 20, 2013  FINDINGS: Lungs are clear. Heart size and pulmonary vascularity are normal. No adenopathy. No bone lesions. No pneumothorax.  IMPRESSION: No abnormality noted.   Electronically Signed   By: Bretta Bang M.D.   On: 11/24/2013 16:50      Patient has a heart score 1.  The patient has a few seconds worth of sharp pain that is coming and going.  This is atypical for cardiac chest pain.  She does have diabetes, but no other significant risk factors.  The patient has had 2 negative enzymes and 2 Negative EKGs.  I will refer the patient to a cardiologist.  Feel this is less likely her heart.  Patient's best return here as needed for any worsening in her condition.  Carlyle Dolly, PA-C 11/24/13 [phone]

## 2013-11-26 NOTE — ED Provider Notes (Signed)
Medical screening examination/treatment/procedure(s) were performed by non-physician practitioner and as supervising physician I was immediately available for consultation/collaboration.   Naser Schuld M Anastasiya Gowin, MD 11/26/13 1332 

## 2014-03-27 DIAGNOSIS — E119 Type 2 diabetes mellitus without complications: Secondary | ICD-10-CM | POA: Diagnosis not present

## 2014-03-27 DIAGNOSIS — F329 Major depressive disorder, single episode, unspecified: Secondary | ICD-10-CM | POA: Diagnosis not present

## 2014-03-27 DIAGNOSIS — R5383 Other fatigue: Secondary | ICD-10-CM | POA: Diagnosis not present

## 2014-03-27 DIAGNOSIS — N390 Urinary tract infection, site not specified: Secondary | ICD-10-CM | POA: Diagnosis not present

## 2014-03-27 DIAGNOSIS — I10 Essential (primary) hypertension: Secondary | ICD-10-CM | POA: Diagnosis not present

## 2014-03-27 DIAGNOSIS — R5381 Other malaise: Secondary | ICD-10-CM | POA: Diagnosis not present

## 2014-03-27 DIAGNOSIS — F3289 Other specified depressive episodes: Secondary | ICD-10-CM | POA: Diagnosis not present

## 2014-06-15 ENCOUNTER — Emergency Department (INDEPENDENT_AMBULATORY_CARE_PROVIDER_SITE_OTHER)
Admission: EM | Admit: 2014-06-15 | Discharge: 2014-06-15 | Disposition: A | Discharge status: Home / Self Care | Payer: Medicare Other | Source: Home / Self Care | Attending: Emergency Medicine | Admitting: Emergency Medicine

## 2014-06-15 ENCOUNTER — Encounter (HOSPITAL_COMMUNITY): Payer: Self-pay | Admitting: Emergency Medicine

## 2014-06-15 DIAGNOSIS — M79601 Pain in right arm: Secondary | ICD-10-CM

## 2014-06-15 DIAGNOSIS — M79609 Pain in unspecified limb: Secondary | ICD-10-CM

## 2014-06-15 MED ORDER — MELOXICAM 15 MG PO TABS: 15.0000 mg | ORAL_TABLET | Freq: Every day | ORAL | Status: DC

## 2014-06-15 MED ORDER — TRAMADOL HCL 50 MG PO TABS: 50.0000 mg | ORAL_TABLET | Freq: Four times a day (QID) | ORAL | Status: DC | PRN

## 2014-06-15 NOTE — ED Notes (Signed)
C/o right elbow pain onset 3 weeks Hurts to bend at elbow Denies inj/trauma Alert, no signs of acute distress.

## 2014-06-15 NOTE — Discharge Instructions (Signed)

## 2014-06-15 NOTE — ED Provider Notes (Signed)
CSN: [id number]     Arrival date & time 06/15/14  1939 History   First MD Initiated Contact with Patient 06/15/14 2046     Chief Complaint  Patient presents with  . Elbow Pain   (Consider location/radiation/quality/duration/timing/severity/associated sxs/prior Treatment) HPI   53 year old female presents complaining of right shoulder and arm pain. This pain has been present for about 3-4 weeks and seems to be worsening. It is worse at night. She has constant pain throughout the entire shoulder, upper arm, and forearm. She denies any injury. She has never had this before. No treatment tried at home. She uses her arms a lot at work.  Past Medical History  Diagnosis Date  . Fibromyalgia   . Diabetes mellitus   . Heart murmur   . Asthma   . Shortness of breath   . Headache(784.0)     migraines  . Depression   . Recurrent upper respiratory infection (URI)   . Complication of anesthesia     difficulty breathing  . Kidney stones     with hematuria  . Carpal tunnel syndrome     bilaterally  . Arthritis     RA "states does not see Rheumatologist"  . Irritable bowel syndrome   . Degenerative disk disease     back  . Kidney stone     with hematuria  . Hypertension     no medication for at this time, sees Dr. Estevan Oaks Pcp  . Hypotensive episode     hx of  . Hepatitis     In followup treatment for hepatitis C   Past Surgical History  Procedure Laterality Date  . Tubal ligation    . Dilation and curettage of uterus    . Liver biopsy    . Tonsillectomy     No family history on file. History  Substance Use Topics  . Smoking status: Never Smoker   . Smokeless tobacco: Not on file  . Alcohol Use: No   OB History   Grav Para Term Preterm Abortions TAB SAB Ect Mult Living                 Review of Systems  Musculoskeletal: Positive for arthralgias.       Right shoulder and arm pain  All other systems reviewed and are negative.   Allergies  Anesthetic ether;  Nubain; Penicillins; Advair diskus; and Lactose intolerance (gi)  Home Medications   Prior to Admission medications   Medication Sig Start Date End Date Taking? Authorizing Provider  albuterol (PROVENTIL HFA;VENTOLIN HFA) 108 (90 BASE) MCG/ACT inhaler Inhale 2 puffs into the lungs every 4 (four) hours as needed for wheezing or shortness of breath.   Yes Historical Provider, MD  metFORMIN (GLUCOPHAGE) 500 MG tablet Take 1 tablet (500 mg total) by mouth 2 (two) times daily. 03/22/13  Yes Fransisca Kaufmann, NP  celecoxib (CELEBREX) 200 MG capsule Take 1 capsule (200 mg total) by mouth 2 (two) times daily. 03/22/13   Fransisca Kaufmann, NP  cyanocobalamin (,VITAMIN B-12,) 1000 MCG/ML injection Inject 1,000 mcg into the muscle every 30 (thirty) days.    Historical Provider, MD  hydrochlorothiazide (HYDRODIURIL) 25 MG tablet Take 25 mg by mouth daily.    Historical Provider, MD  HYDROcodone-acetaminophen (NORCO/VICODIN) 5-325 MG per tablet Take 1 tablet by mouth every 6 (six) hours as needed for moderate pain. 11/24/13   Jamesetta Orleans Lawyer, PA-C  ibuprofen (ADVIL,MOTRIN) 200 MG tablet Take 400 mg by mouth every 4 (four) hours as  needed for headache.    Historical Provider, MD  meloxicam (MOBIC) 15 MG tablet Take 1 tablet (15 mg total) by mouth daily. 06/15/14   Graylon Good, PA-C  omeprazole (PRILOSEC) 20 MG capsule Take 20 mg by mouth daily.    Historical Provider, MD  tetrahydrozoline (VISINE) 0.05 % ophthalmic solution Place 2 drops into both eyes daily as needed.    Historical Provider, MD  traMADol (ULTRAM) 50 MG tablet Take 1 tablet (50 mg total) by mouth every 6 (six) hours as needed. 06/15/14   Adrian Blackwater Margrit Minner, PA-C   BP 100/67  Pulse 63  Temp(Src) 98.1 F (36.7 C) (Oral)  Resp 18  SpO2 96% Physical Exam  Nursing note and vitals reviewed. Constitutional: She is oriented to person, place, and time. Vital signs are normal. She appears well-developed and well-nourished. No distress.  HENT:  Head:  Normocephalic and atraumatic.  Pulmonary/Chest: Effort normal. No respiratory distress.  Musculoskeletal:       Right shoulder: Normal.       Right elbow: Normal.      Right wrist: Normal.       Right upper arm: Normal.       Right forearm: Normal.       Right hand: Normal.  Neurological: She is alert and oriented to person, place, and time. She has normal strength. Coordination normal.  Skin: Skin is warm and dry. No rash noted. She is not diaphoretic.  Psychiatric: She has a normal mood and affect. Judgment normal.    ED Course  Procedures (including critical care time) Labs Review Labs Reviewed - No data to display  Imaging Review No results found.   MDM   1. Right arm pain    The areas she describes as being painful RND right triceps and right forearm, most likely musculoskeletal pain, we'll treat with daily MOBIC and she will followup with her primary care if no improvement.  Meds ordered this encounter  Medications  . meloxicam (MOBIC) 15 MG tablet    Sig: Take 1 tablet (15 mg total) by mouth daily.    Dispense:  30 tablet    Refill:  2    Order Specific Question:  Supervising Provider    Answer:  Linna Hoff [phone]  . traMADol (ULTRAM) 50 MG tablet    Sig: Take 1 tablet (50 mg total) by mouth every 6 (six) hours as needed.    Dispense:  15 tablet    Refill:  0    Order Specific Question:  Supervising Provider    Answer:  Bradd Canary D [[id number]]       Graylon Good, PA-C 06/16/14 [phone]

## 2014-06-16 NOTE — ED Provider Notes (Signed)
Medical screening examination/treatment/procedure(s) were performed by non-physician practitioner and as supervising physician I was immediately available for consultation/collaboration.  Rhianon Zabawa, M.D.  Korina Tretter C Aleen Marston, MD 06/16/14 2004 

## 2014-07-10 ENCOUNTER — Emergency Department (INDEPENDENT_AMBULATORY_CARE_PROVIDER_SITE_OTHER)
Admission: EM | Admit: 2014-07-10 | Discharge: 2014-07-10 | Disposition: A | Discharge status: Home / Self Care | Payer: Medicare Other | Source: Home / Self Care | Attending: Emergency Medicine | Admitting: Emergency Medicine

## 2014-07-10 ENCOUNTER — Encounter (HOSPITAL_COMMUNITY): Payer: Self-pay | Admitting: Emergency Medicine

## 2014-07-10 ENCOUNTER — Emergency Department (INDEPENDENT_AMBULATORY_CARE_PROVIDER_SITE_OTHER): Payer: Medicare Other

## 2014-07-10 DIAGNOSIS — M25521 Pain in right elbow: Secondary | ICD-10-CM | POA: Diagnosis not present

## 2014-07-10 DIAGNOSIS — S53401A Unspecified sprain of right elbow, initial encounter: Secondary | ICD-10-CM

## 2014-07-10 DIAGNOSIS — K2970 Gastritis, unspecified, without bleeding: Secondary | ICD-10-CM

## 2014-07-10 MED ORDER — ONDANSETRON HCL 4 MG PO TABS: 4.0000 mg | ORAL_TABLET | Freq: Four times a day (QID) | ORAL | Status: DC

## 2014-07-10 MED ORDER — GABAPENTIN 300 MG PO CAPS: 300.0000 mg | ORAL_CAPSULE | Freq: Every day | ORAL | Status: DC

## 2014-07-10 MED ORDER — OMEPRAZOLE 20 MG PO CPDR: 20.0000 mg | DELAYED_RELEASE_CAPSULE | Freq: Every day | ORAL | Status: DC

## 2014-07-10 NOTE — ED Notes (Signed)
Patient has multiple complaints.  "sick" consists of feeling nauseated intermittently.  Able to eat, but appetite is poor.  No vomiting.  Symptoms for one week.  Patient also feels asthma is not as controlled as usual.

## 2014-07-10 NOTE — ED Notes (Signed)
Patient is being seen in the same treatment room with 2 minor-aged children.

## 2014-07-10 NOTE — Discharge Instructions (Signed)
You sprained your elbow. - continue the meloxicam - ice it 2-3 times a day. - rest it as much as possible - start gabapentin 300mg  at bedtime. This medicine will make you sleepy.  The stomach may be a stomach bug or heartburn/reflux. - take zofran as needed for nausea - take omeprazole daily for the next month  Followup with your regular doctor in 1 month.

## 2014-07-10 NOTE — ED Provider Notes (Signed)
CSN: [id number]     Arrival date & time 07/10/14  1903 History   First MD Initiated Contact with Patient 07/10/14 1913     Chief Complaint  Patient presents with  . Asthma  . Arm Pain   (Consider location/radiation/quality/duration/timing/severity/associated sxs/prior Treatment) HPI She is a 53 year old woman here for evaluation of right elbow pain and nausea.  The nausea has been going on about one week. It comes and goes. It tends to be triggered by eating. It is associated with some epigastric pain and chest tightness. She reports subjective fevers and chills. No diarrhea.  The right elbow pain has been an ongoing issue for several weeks. She was seen here previously and given a meloxicam. She's been taking meloxicam with no improvement. The pain is located on the lateral aspect of the elbow and radiates down the arm into the hand. She's been doing massage of the forearm and has noted muscle knots.   Past Medical History  Diagnosis Date  . Fibromyalgia   . Diabetes mellitus   . Heart murmur   . Asthma   . Shortness of breath   . Headache(784.0)     migraines  . Depression   . Recurrent upper respiratory infection (URI)   . Complication of anesthesia     difficulty breathing  . Kidney stones     with hematuria  . Carpal tunnel syndrome     bilaterally  . Arthritis     RA "states does not see Rheumatologist"  . Irritable bowel syndrome   . Degenerative disk disease     back  . Kidney stone     with hematuria  . Hypertension     no medication for at this time, sees Dr. Estevan Oaks Pcp  . Hypotensive episode     hx of  . Hepatitis     In followup treatment for hepatitis C   Past Surgical History  Procedure Laterality Date  . Tubal ligation    . Dilation and curettage of uterus    . Liver biopsy    . Tonsillectomy     No family history on file. History  Substance Use Topics  . Smoking status: Never Smoker   . Smokeless tobacco: Not on file  . Alcohol  Use: No   OB History   Grav Para Term Preterm Abortions TAB SAB Ect Mult Living                 Review of Systems  Constitutional: Positive for fever, chills and appetite change.  HENT: Negative.   Respiratory: Positive for chest tightness. Negative for cough, shortness of breath and wheezing.   Cardiovascular: Negative.   Gastrointestinal: Positive for nausea. Negative for vomiting and diarrhea.  Musculoskeletal:       Right elbow/arm pain  Neurological: Negative.     Allergies  Anesthetic ether; Nubain; Penicillins; Advair diskus; and Lactose intolerance (gi)  Home Medications   Prior to Admission medications   Medication Sig Start Date End Date Taking? Authorizing Provider  albuterol (PROVENTIL HFA;VENTOLIN HFA) 108 (90 BASE) MCG/ACT inhaler Inhale 2 puffs into the lungs every 4 (four) hours as needed for wheezing or shortness of breath.    Historical Provider, MD  celecoxib (CELEBREX) 200 MG capsule Take 1 capsule (200 mg total) by mouth 2 (two) times daily. 03/22/13   Fransisca Kaufmann, NP  cyanocobalamin (,VITAMIN B-12,) 1000 MCG/ML injection Inject 1,000 mcg into the muscle every 30 (thirty) days.    Historical Provider, MD  gabapentin (NEURONTIN) 300 MG capsule Take 1 capsule (300 mg total) by mouth at bedtime. 07/10/14   Charm Rings, MD  hydrochlorothiazide (HYDRODIURIL) 25 MG tablet Take 25 mg by mouth daily.    Historical Provider, MD  HYDROcodone-acetaminophen (NORCO/VICODIN) 5-325 MG per tablet Take 1 tablet by mouth every 6 (six) hours as needed for moderate pain. 11/24/13   Jamesetta Orleans Lawyer, PA-C  ibuprofen (ADVIL,MOTRIN) 200 MG tablet Take 400 mg by mouth every 4 (four) hours as needed for headache.    Historical Provider, MD  meloxicam (MOBIC) 15 MG tablet Take 1 tablet (15 mg total) by mouth daily. 06/15/14   Graylon Good, PA-C  metFORMIN (GLUCOPHAGE) 500 MG tablet Take 1 tablet (500 mg total) by mouth 2 (two) times daily. 03/22/13   Fransisca Kaufmann, NP  omeprazole  (PRILOSEC) 20 MG capsule Take 20 mg by mouth daily.    Historical Provider, MD  omeprazole (PRILOSEC) 20 MG capsule Take 1 capsule (20 mg total) by mouth daily. 07/10/14   Charm Rings, MD  ondansetron (ZOFRAN) 4 MG tablet Take 1 tablet (4 mg total) by mouth every 6 (six) hours. 07/10/14   Charm Rings, MD  tetrahydrozoline (VISINE) 0.05 % ophthalmic solution Place 2 drops into both eyes daily as needed.    Historical Provider, MD  traMADol (ULTRAM) 50 MG tablet Take 1 tablet (50 mg total) by mouth every 6 (six) hours as needed. 06/15/14   Adrian Blackwater Baker, PA-C   BP 124/67  Pulse 67  Temp(Src) 98.9 F (37.2 C) (Oral)  Resp 18  SpO2 97% Physical Exam  Constitutional: She is oriented to person, place, and time. She appears well-developed and well-nourished. No distress.  Neck: Neck supple.  Cardiovascular: Normal rate, regular rhythm and normal heart sounds.   No murmur heard. Pulmonary/Chest: Effort normal and breath sounds normal. No respiratory distress. She has no wheezes. She has no rales.  Abdominal: Soft. She exhibits no distension. There is tenderness (epigastric). There is no rebound.  Musculoskeletal:  Right elbow: no erythema or swelling.  Tender over lateral epicondyle and lateral forearm.  Pain with pronation and supination.  Lymphadenopathy:    She has no cervical adenopathy.  Neurological: She is alert and oriented to person, place, and time.  Skin: Skin is warm and dry.    ED Course  Procedures (including critical care time) Labs Review Labs Reviewed - No data to display  Imaging Review Dg Elbow Complete Right  07/10/2014   CLINICAL DATA:  Right posterior elbow pain for 1 month, no known injury  EXAM: RIGHT ELBOW - COMPLETE 3+ VIEW  COMPARISON:  None.  FINDINGS: Four views of the right elbow submitted. No acute fracture or subluxation. No posterior fat pad sign.  IMPRESSION: Negative.   Electronically Signed   By: Natasha Mead M.D.   On: 07/10/2014 20:43     MDM    1. Gastritis   2. Elbow sprain, right, initial encounter    She likely has viral gastritis versus GERD. Will treat with Zofran when necessary and omeprazole daily. Followup with PCP in 1 month.  She likely has an elbow sprain. She may also have some irritation of the ulnar nerve. Continue meloxicam daily. Recommended icing 2-3 times a day and resting the elbow is much as possible. We'll try gabapentin 300 mg at bedtime to help with the radiating pain. Recommended followup with orthopedics if this does not improve in the next few weeks.    Finis Bud  Piedad Climes, MD 07/10/14 2133

## 2014-07-24 ENCOUNTER — Telehealth: Payer: Self-pay | Admitting: Gastroenterology

## 2014-07-24 DIAGNOSIS — N390 Urinary tract infection, site not specified: Secondary | ICD-10-CM | POA: Diagnosis not present

## 2014-07-24 DIAGNOSIS — E119 Type 2 diabetes mellitus without complications: Secondary | ICD-10-CM | POA: Diagnosis not present

## 2014-07-24 DIAGNOSIS — I10 Essential (primary) hypertension: Secondary | ICD-10-CM | POA: Diagnosis not present

## 2014-07-24 DIAGNOSIS — R5382 Chronic fatigue, unspecified: Secondary | ICD-10-CM | POA: Diagnosis not present

## 2014-07-24 NOTE — Telephone Encounter (Signed)
Patient states she is having nausea and reflux. It was very bad this past weekend. She also states it was so bad once that she passed out. Discussed this with the patient and she agrees to contact her primary care about "passing out" but will come here for evaluation of her GI symptoms. Verbalizes understanding that "passing out" is not a GI issue and should be followed up on with PCP.

## 2014-07-25 ENCOUNTER — Ambulatory Visit (INDEPENDENT_AMBULATORY_CARE_PROVIDER_SITE_OTHER): Payer: Medicare Other | Admitting: Physician Assistant

## 2014-07-25 ENCOUNTER — Other Ambulatory Visit (INDEPENDENT_AMBULATORY_CARE_PROVIDER_SITE_OTHER): Payer: Medicare Other

## 2014-07-25 ENCOUNTER — Encounter: Payer: Self-pay | Admitting: Physician Assistant

## 2014-07-25 VITALS — BP 116/64 | HR 84 | Ht 59.0 in | Wt 224.4 lb

## 2014-07-25 DIAGNOSIS — R110 Nausea: Secondary | ICD-10-CM

## 2014-07-25 DIAGNOSIS — M797 Fibromyalgia: Secondary | ICD-10-CM

## 2014-07-25 DIAGNOSIS — R1013 Epigastric pain: Secondary | ICD-10-CM

## 2014-07-25 DIAGNOSIS — E119 Type 2 diabetes mellitus without complications: Secondary | ICD-10-CM | POA: Insufficient documentation

## 2014-07-25 DIAGNOSIS — B182 Chronic viral hepatitis C: Secondary | ICD-10-CM | POA: Diagnosis not present

## 2014-07-25 LAB — CBC WITH DIFFERENTIAL/PLATELET
BASOS PCT: 0.5 % (ref 0.0–3.0)
Basophils Absolute: 0.1 10*3/uL (ref 0.0–0.1)
EOS PCT: 1.1 % (ref 0.0–5.0)
Eosinophils Absolute: 0.1 10*3/uL (ref 0.0–0.7)
HCT: 46.1 % — ABNORMAL HIGH (ref 36.0–46.0)
Hemoglobin: 15.4 g/dL — ABNORMAL HIGH (ref 12.0–15.0)
Lymphocytes Relative: 29.7 % (ref 12.0–46.0)
Lymphs Abs: 3.2 10*3/uL (ref 0.7–4.0)
MCHC: 33.4 g/dL (ref 30.0–36.0)
MCV: 97.1 fl (ref 78.0–100.0)
MONOS PCT: 9.2 % (ref 3.0–12.0)
Monocytes Absolute: 1 10*3/uL (ref 0.1–1.0)
NEUTROS PCT: 59.5 % (ref 43.0–77.0)
Neutro Abs: 6.5 10*3/uL (ref 1.4–7.7)
PLATELETS: 213 10*3/uL (ref 150.0–400.0)
RBC: 4.75 Mil/uL (ref 3.87–5.11)
RDW: 13.3 % (ref 11.5–15.5)
WBC: 10.9 10*3/uL — AB (ref 4.0–10.5)

## 2014-07-25 LAB — COMPREHENSIVE METABOLIC PANEL
ALK PHOS: 75 U/L (ref 39–117)
ALT: 214 U/L — ABNORMAL HIGH (ref 0–35)
AST: 163 U/L — ABNORMAL HIGH (ref 0–37)
Albumin: 3.3 g/dL — ABNORMAL LOW (ref 3.5–5.2)
BILIRUBIN TOTAL: 0.4 mg/dL (ref 0.2–1.2)
BUN: 13 mg/dL (ref 6–23)
CO2: 29 mEq/L (ref 19–32)
Calcium: 9.2 mg/dL (ref 8.4–10.5)
Chloride: 105 mEq/L (ref 96–112)
Creatinine, Ser: 0.8 mg/dL (ref 0.4–1.2)
GFR: 82.5 mL/min (ref >60.00)
Glucose, Bld: 87 mg/dL (ref 70–99)
POTASSIUM: 3.8 meq/L (ref 3.5–5.1)
SODIUM: 141 meq/L (ref 135–145)
TOTAL PROTEIN: 6.8 g/dL (ref 6.0–8.3)

## 2014-07-25 LAB — LIPASE: Lipase: 34 U/L (ref 11.0–59.0)

## 2014-07-25 MED ORDER — ONDANSETRON HCL 8 MG PO TABS: ORAL_TABLET | ORAL | Status: DC

## 2014-07-25 NOTE — Progress Notes (Signed)
Subjective:    Patient ID: Susan Hogan, female    DOB: [birth year], 53 y.o.   MRN: [id number]  HPI  Susan Hogan is a pleasant 53 year old female known remotely to Dr. Arlyce Dice. She had undergone colonoscopy for diarrhea in 2007. This was a normal exam random biopsies were taken to rule out microscopic colitis and these were negative.  History is positive for hepatitis C genotype 1, not treated, depression, but ALT onset diabetes mellitus, and obesity. She has had a lot of arthritic symptoms and is currently taking Celebrex and Advil. She comes in today with complaints of epigastric pain and nausea. She says her symptoms initially started in September and have been much worse over the past week. She describes the discomfort as burning in nature. She also relates she's had a couple of episodes of hypoglycemia with diaphoresis and some dizziness. Says she feels so nauseated most of the time that she wants to vomit but does not. She is nauseated whether she eats or not. Tight has been decreased. She had been on phentermine for weight loss apparently for several months and did lose a large amount of weight area she started having problems with palpitations and says she was taking off the phentermine and has now gained about 30 pounds. She also has had ongoing problems with heartburn and indigestion and has solid food dysphagia intermittently. She says that his been happening more frequently recently with the sensation of her food sticking. She usually has to stop eating and drink fluids to push the food down. Not currently having any problems with her bowels. She has Zofran at home but does not find it very helpful and has been taking OTC Prilosec once daily. No recent imaging or labs    Review of Systems  Constitutional: Positive for diaphoresis, appetite change and unexpected weight change.  HENT: Positive for trouble swallowing.   Eyes: Negative.   Respiratory: Negative.   Cardiovascular: Negative.     Gastrointestinal: Positive for nausea and abdominal pain.  Endocrine: Negative.   Genitourinary: Negative.   Musculoskeletal: Positive for arthralgias.  Allergic/Immunologic: Negative.   Neurological: Negative.   Hematological: Negative.   Psychiatric/Behavioral: Negative.    Outpatient Prescriptions Prior to Visit  Medication Sig Dispense Refill  . albuterol (PROVENTIL HFA;VENTOLIN HFA) 108 (90 BASE) MCG/ACT inhaler Inhale 2 puffs into the lungs every 4 (four) hours as needed for wheezing or shortness of breath.    . celecoxib (CELEBREX) 200 MG capsule Take 1 capsule (200 mg total) by mouth 2 (two) times daily.    . cyanocobalamin (,VITAMIN B-12,) 1000 MCG/ML injection Inject 1,000 mcg into the muscle every 30 (thirty) days.    . hydrochlorothiazide (HYDRODIURIL) 25 MG tablet Take 25 mg by mouth daily.    Marland Kitchen ibuprofen (ADVIL,MOTRIN) 200 MG tablet Take 400 mg by mouth every 4 (four) hours as needed for headache.    . meloxicam (MOBIC) 15 MG tablet Take 1 tablet (15 mg total) by mouth daily. 30 tablet 2  . metFORMIN (GLUCOPHAGE) 500 MG tablet Take 1 tablet (500 mg total) by mouth 2 (two) times daily.    Marland Kitchen omeprazole (PRILOSEC) 20 MG capsule Take 20 mg by mouth daily.    . ondansetron (ZOFRAN) 4 MG tablet Take 1 tablet (4 mg total) by mouth every 6 (six) hours. 20 tablet 0  . tetrahydrozoline (VISINE) 0.05 % ophthalmic solution Place 2 drops into both eyes daily as needed.    . gabapentin (NEURONTIN) 300 MG capsule Take 1 capsule (300  mg total) by mouth at bedtime. 30 capsule 0  . HYDROcodone-acetaminophen (NORCO/VICODIN) 5-325 MG per tablet Take 1 tablet by mouth every 6 (six) hours as needed for moderate pain. 15 tablet 0  . omeprazole (PRILOSEC) 20 MG capsule Take 1 capsule (20 mg total) by mouth daily. 30 capsule 0  . traMADol (ULTRAM) 50 MG tablet Take 1 tablet (50 mg total) by mouth every 6 (six) hours as needed. 15 tablet 0   No facility-administered medications prior to visit.    Allergies  Allergen Reactions  . Anesthetic Ether [Ether] Shortness Of Breath    IV anesthesia causes extreme pain and tightness in back and ribcage.  . Nubain [Nalbuphine Hcl] Anaphylaxis  . Penicillins Hives and Shortness Of Breath  . Advair Diskus [Fluticasone-Salmeterol] Hives  . Lactose Intolerance (Gi) Diarrhea   Patient Active Problem List   Diagnosis Date Noted  . Diabetes mellitus 07/25/2014  . Fibromyalgia 07/25/2014  . MDD (major depressive disorder), recurrent severe, without psychosis 03/17/2013  . HEPATITIS C 08/02/2007  . IRRITABLE BOWEL SYNDROME 08/02/2007  . DYSPNEA ON EXERTION 08/02/2007   History  Substance Use Topics  . Smoking status: Never Smoker   . Smokeless tobacco: Never Used  . Alcohol Use: No   family history includes Anemia in her mother; Asthma in her maternal grandmother and mother; Colon cancer in her maternal uncle; Diabetes in her maternal grandmother and mother; Heart disease in her maternal grandmother, maternal uncle, and mother.     Objective:   Physical Exam  Well-developed female in no acute distress, pleasant blood pressure 116/64 pulse 84 height 4 foot 11 weight 224. BMI 45 HEENT ;nontraumatic normocephalic EOMI PERRLA sclera anicteric, Supple ;no JVD, Cardiovascular; regular rate and rhythm with S1-S2 no murmur rub or gallop, Ulnar clear bilaterally, Abdomen ;large soft tender in the epigastrium and also in the left upper quadrant no guarding or rebound no palpable mass or hepatosplenomegaly bowel sounds are present, Rectal; exam not done, Extremities ;no clubbing cyanosis or edema skin warm and dry, Psych; mood and affect appropriate        Assessment & Plan:  #17  53 year old female with several week history of somewhat progressive epigastric pain and nausea Rule out gallbladder disease, rule out NSAID-induced gastropathy or peptic ulcer disease rule out H. Pylori induced gastropathy #2: AODM #3 history of IBS #4 depression #5  fibromyalgia #6 hepatitis C genotype 1, not treated-been seen at the hepatitis clinic last in 2013  Plan; increase Prilosec to 20 mg by mouth twice daily Bland diet Increase Zofran to 8 mg by mouth every 12 hours Schedule for upper abdominal ultrasound Schedule for EGD with Dr. Arlyce Dice with possible esophageal dilation procedure discussed in detail with the patient and she is agreeable to proceed CBC with differential, cemented lipase today She is encouraged to decrease NSAID use as much as possible I also advised her to obtain an a appointment at the hepatitis C clinic for reevaluation now that the treatment regimen has been significantly simplified

## 2014-07-25 NOTE — Patient Instructions (Signed)
Please go to the basement level to have your labs drawn.  We sent a prescription for Zofran 4 mg to [facility] [address] [place]. You have been scheduled for an endoscopy. Please follow written instructions given to you at your visit today. If you use inhalers (even only as needed), please bring them with you on the day of your procedure.  You have been scheduled for an abdominal ultrasound at [facility] (1st floor of hospital) on 07-30-2014 at 9:30 am . Please arrive at 9:15  prior to your appointment for registration. Make certain not to have anything to eat or drink 6 hours prior to your appointment. Should you need to reschedule your appointment, please contact radiology at [phone]. This test typically takes about 30 minutes to perform.

## 2014-07-26 DIAGNOSIS — E119 Type 2 diabetes mellitus without complications: Secondary | ICD-10-CM | POA: Diagnosis not present

## 2014-07-26 DIAGNOSIS — R51 Headache: Secondary | ICD-10-CM | POA: Diagnosis not present

## 2014-07-26 DIAGNOSIS — E1143 Type 2 diabetes mellitus with diabetic autonomic (poly)neuropathy: Secondary | ICD-10-CM | POA: Diagnosis not present

## 2014-07-26 DIAGNOSIS — N390 Urinary tract infection, site not specified: Secondary | ICD-10-CM | POA: Diagnosis not present

## 2014-07-26 DIAGNOSIS — R110 Nausea: Secondary | ICD-10-CM | POA: Diagnosis not present

## 2014-07-26 DIAGNOSIS — I10 Essential (primary) hypertension: Secondary | ICD-10-CM | POA: Diagnosis not present

## 2014-07-26 DIAGNOSIS — J45909 Unspecified asthma, uncomplicated: Secondary | ICD-10-CM | POA: Diagnosis not present

## 2014-07-26 NOTE — Progress Notes (Signed)
Reviewed and agree with management. May consider gastric emptying scan pending results of above Robert D. Kaplan, M.D., FACG  

## 2014-07-30 ENCOUNTER — Ambulatory Visit (HOSPITAL_COMMUNITY)
Admission: RE | Admit: 2014-07-30 | Discharge: 2014-07-30 | Disposition: A | Discharge status: Home / Self Care | Payer: Medicare Other | Source: Ambulatory Visit | Attending: Radiology | Admitting: Radiology

## 2014-07-30 DIAGNOSIS — R110 Nausea: Secondary | ICD-10-CM

## 2014-07-30 DIAGNOSIS — Z8619 Personal history of other infectious and parasitic diseases: Secondary | ICD-10-CM | POA: Insufficient documentation

## 2014-07-30 DIAGNOSIS — R1013 Epigastric pain: Secondary | ICD-10-CM | POA: Insufficient documentation

## 2014-07-30 DIAGNOSIS — R109 Unspecified abdominal pain: Secondary | ICD-10-CM | POA: Diagnosis not present

## 2014-08-01 ENCOUNTER — Ambulatory Visit: Payer: Self-pay | Admitting: Cardiovascular Disease

## 2014-08-01 ENCOUNTER — Encounter: Payer: Self-pay | Admitting: Gastroenterology

## 2014-08-08 ENCOUNTER — Encounter: Payer: Self-pay | Admitting: Gastroenterology

## 2014-08-08 ENCOUNTER — Telehealth: Payer: Self-pay | Admitting: Gastroenterology

## 2014-08-08 NOTE — Telephone Encounter (Signed)
no

## 2014-08-29 DIAGNOSIS — F332 Major depressive disorder, recurrent severe without psychotic features: Secondary | ICD-10-CM | POA: Diagnosis not present

## 2014-09-28 ENCOUNTER — Ambulatory Visit (AMBULATORY_SURGERY_CENTER): Payer: Medicare Other | Admitting: Gastroenterology

## 2014-09-28 ENCOUNTER — Encounter: Payer: Self-pay | Admitting: Gastroenterology

## 2014-09-28 VITALS — BP 125/51 | HR 61 | Temp 97.5°F | Resp 30 | Ht 59.0 in | Wt 224.0 lb

## 2014-09-28 DIAGNOSIS — K589 Irritable bowel syndrome without diarrhea: Secondary | ICD-10-CM | POA: Diagnosis not present

## 2014-09-28 DIAGNOSIS — R1310 Dysphagia, unspecified: Secondary | ICD-10-CM | POA: Diagnosis not present

## 2014-09-28 DIAGNOSIS — M797 Fibromyalgia: Secondary | ICD-10-CM | POA: Diagnosis not present

## 2014-09-28 DIAGNOSIS — E119 Type 2 diabetes mellitus without complications: Secondary | ICD-10-CM | POA: Diagnosis not present

## 2014-09-28 DIAGNOSIS — R1013 Epigastric pain: Secondary | ICD-10-CM | POA: Diagnosis not present

## 2014-09-28 DIAGNOSIS — F329 Major depressive disorder, single episode, unspecified: Secondary | ICD-10-CM | POA: Diagnosis not present

## 2014-09-28 DIAGNOSIS — K222 Esophageal obstruction: Secondary | ICD-10-CM | POA: Diagnosis not present

## 2014-09-28 DIAGNOSIS — K259 Gastric ulcer, unspecified as acute or chronic, without hemorrhage or perforation: Secondary | ICD-10-CM

## 2014-09-28 DIAGNOSIS — B1920 Unspecified viral hepatitis C without hepatic coma: Secondary | ICD-10-CM | POA: Diagnosis not present

## 2014-09-28 DIAGNOSIS — K2950 Unspecified chronic gastritis without bleeding: Secondary | ICD-10-CM | POA: Diagnosis not present

## 2014-09-28 LAB — GLUCOSE, CAPILLARY
GLUCOSE-CAPILLARY: 98 mg/dL (ref 70–99)
Glucose-Capillary: 83 mg/dL (ref 70–99)

## 2014-09-28 MED ORDER — SODIUM CHLORIDE 0.9 % IV SOLN: 500.0000 mL | INTRAVENOUS | Status: DC

## 2014-09-28 NOTE — Progress Notes (Signed)
[id number] Pt wheezing, increased 02 6 liters Sao2 decreased 82 pt airway assisted with O/A and brief use of Ambu bag 100% with positive pressure to break wheezing episode  Sao2 increased 89-94 - 97 % Pt awake stable coughing color good.

## 2014-09-28 NOTE — Op Note (Addendum)
[facility] [address] [place], [address]   ENDOSCOPY PROCEDURE REPORT  PATIENT: Susan Hogan, Susan Hogan  MR#: [id number] BIRTHDATE: 1960-11-08 , 53  yrs. old GENDER: female ENDOSCOPIST: Louis Meckel, MD REFERRED BY: PROCEDURE DATE:  09/28/2014 PROCEDURE:  EGD w/ biopsy and Maloney dilation of esophagus ASA CLASS:     Class II INDICATIONS:  dysphagia and nausea. MEDICATIONS: Monitored anesthesia care and Propofol 160 mg IV TOPICAL ANESTHETIC:  DESCRIPTION OF PROCEDURE: After the risks benefits and alternatives of the procedure were thoroughly explained, informed consent was obtained.  The LB [id number] endoscope was introduced through the mouth and advanced to the second portion of the duodenum , Without limitations.  The instrument was slowly withdrawn as the mucosa was fully examined.    ESOPHAGUS: There was a peptic stricture at the gastroesophageal junction.  The stricture was traversable.  The stricture was dilated using a 70mm (52Fr) Maloney dilator.      STOMACH: Multiple non-bleeding superficial ulcers measuring 1 x 36mm in size were found in the gastric antrum.  Biopsies were taken.  Retroflexed views revealed no abnormalities.     The scope was then withdrawn from the patient and the procedure completed.  COMPLICATIONS: Hypoxemia occurred . The patient developed laryngospasm and transient  hypoxemia lasting 10-15 seconds.  O2 sat duration dropped to 52-54% but rapidly increased to 99%.  ENDOSCOPIC IMPRESSION: 1.   There was a stricture at the gastroesophageal junction; The stricture was dilated using a 72mm (51Fr) Maloney dilator 2.   ssuperficial antral ulcers?" related   RECOMMENDATIONS: 1.  Await biopsy results 2.  Continue PPI 3.  My office will arrange for you to have a Gastric Emptying Scan performed.  This is a radiology test that gives an idea of how well your stomach functions. 4.  minimize NSAID use 5.  office visit 4-5  weeks  REPEAT EXAM:  eSigned:  Louis Meckel, MD 10/02/2014 9:56 AM Revised: 10/02/2014 9:56 AM   CC: Lerry Liner, MD

## 2014-09-28 NOTE — Progress Notes (Signed)
Called to room to assist during endoscopic procedure.  Patient ID and intended procedure confirmed with present staff. Received instructions for my participation in the procedure from the performing physician.  

## 2014-09-28 NOTE — Patient Instructions (Signed)

## 2014-09-29 ENCOUNTER — Telehealth: Payer: Self-pay | Admitting: Gastroenterology

## 2014-09-29 NOTE — Telephone Encounter (Signed)
On call note. Had egd 1/8. Has ongoing belching, gas, chest pain, back pain. Not taking ppi for several weeks. Resume Prilosec 20 mg daily, 1st dose tonight and then ongoing qam. TUMS q4h prn. Call Monday if symptoms not improving.

## 2014-10-01 ENCOUNTER — Telehealth: Payer: Self-pay

## 2014-10-01 NOTE — Telephone Encounter (Signed)
  Follow up Call-  Call back number 09/28/2014  Post procedure Call Back phone  # [phone]  Permission to leave phone message Yes     Patient questions:  Do you have a fever, pain , or abdominal swelling? Yes.   Pain Score  5 *  Have you tolerated food without any problems? Yes.    Have you been able to return to your normal activities? Yes.    Do you have any questions about your discharge instructions: Diet   No. Medications  No. Follow up visit  No.  Do you have questions or concerns about your Care? No.  Actions: * If pain score is 4 or above: No action needed, pain <4. Susan Hogan said she developed back pain following the procedure Friday. Her pain was rated 4-5. Offered to have Dr. Arlyce Dice call her back, but she said she called our office yesterday. Susan Hogan believes her back pain is related to a degenerated disk and she is taking Aleve. Reminded Susan Hogan to minimize NSAID use per Dr. Arlyce Dice. She said she will call back if pain gets worse.

## 2014-10-05 ENCOUNTER — Ambulatory Visit (INDEPENDENT_AMBULATORY_CARE_PROVIDER_SITE_OTHER): Payer: Medicare Other | Admitting: Cardiovascular Disease

## 2014-10-05 ENCOUNTER — Encounter: Payer: Self-pay | Admitting: Cardiovascular Disease

## 2014-10-05 ENCOUNTER — Encounter: Payer: Self-pay | Admitting: Gastroenterology

## 2014-10-05 VITALS — BP 122/86 | HR 69 | Ht 59.0 in | Wt 226.0 lb

## 2014-10-05 DIAGNOSIS — R55 Syncope and collapse: Secondary | ICD-10-CM

## 2014-10-05 DIAGNOSIS — R079 Chest pain, unspecified: Secondary | ICD-10-CM

## 2014-10-05 NOTE — Progress Notes (Signed)
10/05/2014 Sendy Ray   1961/09/16  [id number]  Primary Physician Jearld Lesch, MD Primary Cardiologist: Runell Gess MD Roseanne Reno   HPI:  Mrs. Susan Hogan is a 54 year old moderately overweight single Latino female mother of 7 children, grandmother of 8 grandchildren who is currently on disability. She was referred by Dr. Mayford Knife, her primary care physician, for evaluation of syncope. She has a history of non-insulin requiring diabetes. She has never had a heart attack or stroke. She does get dyspnea on exertion and occasional chest pain. She had a witnessed episode of syncope one to 2 months ago. She had EGD performed with Dr. Arlyce Dice recently with esophageal dilatation. Apparently he found ulcers.   Current Outpatient Prescriptions  Medication Sig Dispense Refill  . albuterol (PROVENTIL HFA;VENTOLIN HFA) 108 (90 BASE) MCG/ACT inhaler Inhale 2 puffs into the lungs every 4 (four) hours as needed for wheezing or shortness of breath.    . cyanocobalamin (,VITAMIN B-12,) 1000 MCG/ML injection Inject 1,000 mcg into the muscle every 30 (thirty) days.    . metFORMIN (GLUCOPHAGE) 500 MG tablet Take 1 tablet (500 mg total) by mouth 2 (two) times daily.    Marland Kitchen omeprazole (PRILOSEC) 20 MG capsule Take 20 mg by mouth daily.     No current facility-administered medications for this visit.    Allergies  Allergen Reactions  . Anesthetic Ether [Ether] Shortness Of Breath    IV anesthesia causes extreme pain and tightness in back and ribcage.  . Nubain [Nalbuphine Hcl] Anaphylaxis  . Penicillins Hives and Shortness Of Breath  . Advair Diskus [Fluticasone-Salmeterol] Hives  . Lactose Intolerance (Gi) Diarrhea    History   Social History  . Marital Status: Single    Spouse Name: N/A    Number of Children: 7  . Years of Education: N/A   Occupational History  . Disabled    Social History Main Topics  . Smoking status: Never Smoker   . Smokeless tobacco: Never Used    . Alcohol Use: No  . Drug Use: No  . Sexual Activity: Not on file   Other Topics Concern  . Not on file   Social History Narrative     Review of Systems: General: negative for chills, fever, night sweats or weight changes.  Cardiovascular: negative for chest pain, dyspnea on exertion, edema, orthopnea, palpitations, paroxysmal nocturnal dyspnea or shortness of breath Dermatological: negative for rash Respiratory: negative for cough or wheezing Urologic: negative for hematuria Abdominal: negative for nausea, vomiting, diarrhea, bright red blood per rectum, melena, or hematemesis Neurologic: negative for visual changes, syncope, or dizziness All other systems reviewed and are otherwise negative except as noted above.    Blood pressure 122/86, pulse 69, height 4\' 11"  (1.499 m), weight 226 lb (102.513 kg).  General appearance: alert and no distress Neck: no adenopathy, no carotid bruit, no JVD, supple, symmetrical, trachea midline and thyroid not enlarged, symmetric, no tenderness/mass/nodules Lungs: clear to auscultation bilaterally Heart: regular rate and rhythm, S1, S2 normal, no murmur, click, rub or gallop Extremities: extremities normal, atraumatic, no cyanosis or edema  EKG normal sinus rhythm at 69 without ST or T-wave changes. I personally reviewed his EKG  ASSESSMENT AND PLAN:   Syncope The patient had an episode of witnessed syncope one to 2 months ago without recurrent symptoms. I'm going to get a one-month event monitor and a 2-D echocardiogram   DYSPNEA ON EXERTION Patient complains of increasing dyspnea on exertion and atypical chest pain. She basically has  no critical factors other than non-insulin requiring diabetes. I'm going to get a firm plastic Myoview stress test and a 2-D echocardiogram       Runell Gess MD [facility], [facility] 10/05/2014 2:37 PM

## 2014-10-05 NOTE — Patient Instructions (Signed)
  We will see you back in follow up after the tests.  Dr Allyson Sabal has ordered: 1.  Event monitor. Event monitors are medical devices that record the heart's electrical activity. Doctors most often [place] these monitors to diagnose arrhythmias. Arrhythmias are problems with the speed or rhythm of the heartbeat. The monitor is a small, portable device. You can wear one while you do your normal daily activities. This is usually used to diagnose what is causing palpitations/syncope (passing out).  2.  Echocardiogram. Echocardiography is a painless test that uses sound waves to create images of your heart. It provides your doctor with information about the size and shape of your heart and how well your heart's chambers and valves are working. This procedure takes approximately one hour. There are no restrictions for this procedure.   3. Lexiscan Myoview- this is a test that looks at the blood flow to your heart muscle.  It takes approximately 2 1/2 hours. Please follow instruction sheet, as given.

## 2014-10-05 NOTE — Assessment & Plan Note (Signed)
Patient complains of increasing dyspnea on exertion and atypical chest pain. She basically has no critical factors other than non-insulin requiring diabetes. I'm going to get a firm plastic Myoview stress test and a 2-D echocardiogram

## 2014-10-05 NOTE — Assessment & Plan Note (Signed)
The patient had an episode of witnessed syncope one to 2 months ago without recurrent symptoms. I'm going to get a one-month event monitor and a 2-D echocardiogram

## 2014-10-18 ENCOUNTER — Ambulatory Visit (HOSPITAL_COMMUNITY)
Admission: RE | Admit: 2014-10-18 | Discharge: 2014-10-18 | Disposition: A | Discharge status: Home / Self Care | Payer: Medicare Other | Source: Ambulatory Visit | Attending: Cardiovascular Disease | Admitting: Cardiovascular Disease

## 2014-10-18 ENCOUNTER — Ambulatory Visit (HOSPITAL_BASED_OUTPATIENT_CLINIC_OR_DEPARTMENT_OTHER)
Admission: RE | Admit: 2014-10-18 | Discharge: 2014-10-18 | Disposition: A | Discharge status: Home / Self Care | Payer: Medicare Other | Source: Ambulatory Visit | Attending: Cardiovascular Disease | Admitting: Cardiovascular Disease

## 2014-10-18 DIAGNOSIS — R55 Syncope and collapse: Secondary | ICD-10-CM | POA: Diagnosis not present

## 2014-10-18 DIAGNOSIS — R079 Chest pain, unspecified: Secondary | ICD-10-CM | POA: Diagnosis not present

## 2014-10-18 MED ORDER — TECHNETIUM TC 99M SESTAMIBI GENERIC - CARDIOLITE
30.4000 | Freq: Once | INTRAVENOUS | Status: AC | PRN
  Administered 2014-10-18: 30 via INTRAVENOUS

## 2014-10-18 MED ORDER — AMINOPHYLLINE 25 MG/ML IV SOLN
75.0000 mg | Freq: Once | INTRAVENOUS | Status: AC
  Administered 2014-10-18: 75 mg via INTRAVENOUS

## 2014-10-18 MED ORDER — TECHNETIUM TC 99M SESTAMIBI GENERIC - CARDIOLITE
10.8000 | Freq: Once | INTRAVENOUS | Status: AC | PRN
  Administered 2014-10-18: 11 via INTRAVENOUS

## 2014-10-18 MED ORDER — REGADENOSON 0.4 MG/5ML IV SOLN
0.4000 mg | Freq: Once | INTRAVENOUS | Status: AC
  Administered 2014-10-18: 0.4 mg via INTRAVENOUS

## 2014-10-18 NOTE — Procedures (Addendum)
[facility] [address] [place] [address] [phone]  Cardiology Nuclear Med Study  Susan Hogan is a 54 y.o. female     MRN : [id number]     DOB: Sep 27, 1960  Procedure Date: 10/18/2014  Nuclear Med Background Indication for Stress Test:  Evaluation for Ischemia History:  Asthma and No prior cardiac history reported;No prior NUC MPI for comparison. Cardiac Risk Factors: NIDDM and Obesity  Symptoms:  Chest Pain, Dizziness, DOE, Fatigue, Light-Headedness, Palpitations and Syncope   Nuclear Pre-Procedure Caffeine/Decaff Intake:  8:00pm NPO After: 6:00am   IV Site: R Forearm  IV 0.9% NS with Angio Cath:  22g  Chest Size (in):  n/a IV Started by: Berdie Ogren, RN  Height: 4\' 11"  (1.499 m)  Cup Size: D  BMI:  Body mass index is 45.62 kg/(m^2). Weight:  226 lb (102.513 kg)   Tech Comments:  n/a    Nuclear Med Study 1 or 2 day study: 1 day  Stress Test Type:  Lexiscan  Order Authorizing Provider:  , MD   Resting Radionuclide: Technetium 72m Sestamibi  Resting Radionuclide Dose: 10.8 mCi   Stress Radionuclide:  Technetium 88m Sestamibi  Stress Radionuclide Dose: 30.4 mCi           Stress Protocol Rest HR: 61 Stress HR: 93  Rest BP: 134/65 Stress BP: 99/67  Exercise Time (min): n/a METS: n/a   Predicted Max HR: 165 bpm % Max HR: 56.36 bpm Rate Pressure Product: 03-07-1978  Dose of Adenosine (mg):  n/a Dose of Lexiscan: 0.4 mg  Dose of Atropine (mg): n/a Dose of Dobutamine: n/a mcg/kg/min (at max HR)  Stress Test Technologist: [id number], CCT Nuclear Technologist: Esperanza Sheets, CNMT   Rest Procedure:  Myocardial perfusion imaging was performed at rest 45 minutes following the intravenous administration of Technetium 36m Sestamibi. Stress Procedure:  The patient received IV Lexiscan 0.4 mg over 15-seconds.  Technetium 103m Sestamibi injected IV at 30-seconds.  Patient experienced SOB and Dizziness  with Hypotension and 75 mg of Aminophylline IV was administered.  There were no significant changes with Lexiscan.  Quantitative spect images were obtained after a 45 minute delay.  Transient Ischemic Dilatation (Normal <1.22):  1.46 QGS EDV:  60 ml QGS ESV:  17 ml LV Ejection Fraction: 72%   Rest ECG: NSR - Normal EKG  Stress ECG: No significant change from baseline ECG  QPS Raw Data Images:  Normal; no motion artifact; normal heart/lung ratio. Stress Images:  Normal homogeneous uptake in all areas of the myocardium. Rest Images:  Normal homogeneous uptake in all areas of the myocardium. Subtraction (SDS):  No evidence of ischemia. LV Wall Motion:  NL LV Function; NL Wall Motion  Impression Exercise Capacity:  Lexiscan with no exercise. BP Response:  Normal blood pressure response. Clinical Symptoms:  No significant symptoms noted. ECG Impression:  No significant ECG changes with Lexiscan. Comparison with Prior Nuclear Study: No previous nuclear study performed   Overall Impression:  Normal stress nuclear study.   84m, MD  10/18/2014 1:09 PM

## 2014-10-18 NOTE — Progress Notes (Signed)
2D Echo Performed 10/18/2014    Rashia Mckesson, RCS  

## 2014-10-26 ENCOUNTER — Ambulatory Visit (INDEPENDENT_AMBULATORY_CARE_PROVIDER_SITE_OTHER): Payer: Medicare Other | Admitting: Cardiovascular Disease

## 2014-10-26 ENCOUNTER — Encounter: Payer: Self-pay | Admitting: Cardiovascular Disease

## 2014-10-26 VITALS — BP 124/80 | HR 78 | Ht 60.0 in | Wt 225.6 lb

## 2014-10-26 DIAGNOSIS — R55 Syncope and collapse: Secondary | ICD-10-CM | POA: Diagnosis not present

## 2014-10-26 NOTE — Assessment & Plan Note (Signed)
The patient had witnessed syncope. At about March showed sinus rhythm/sinus tachycardia but no other tachycardia or bradycardia arrhythmias that would explain this. I did tell her that she should not drive for 6 months from her syncopal episode.

## 2014-10-26 NOTE — Assessment & Plan Note (Signed)
Susan Hogan was complaining of dyspnea on exertion. A 2-D echo was entirely normal as was a Myoview stress test. I suggested the part of her dyspnea may be obesity and suggested weight loss. I do not think that there is a cardiovascular etiology associated with her symptoms.

## 2014-10-26 NOTE — Patient Instructions (Signed)
Your physician wants you to follow-up in: 6 Months You will receive a reminder letter in the mail two months in advance. If you don't receive a letter, please call our office to schedule the follow-up appointment.  

## 2014-10-26 NOTE — Progress Notes (Signed)
Mrs. Callies returns today for follow-up of her outpatient studies. A event monitor was placed because of syncope that was unrevealing. A 2-D echo and Myoview stress test were all normal. We talked about the importance of weight reduction, diet and exercise. I have reassured her that I did not think that her symptoms are cardiovascular in nature. I will see her back in 6 months for follow-up.  Runell Gess, M.D., FACP, [facility], [facility] [address] [place], [place]  [address]  [phone] 10/26/2014 9:19 AM

## 2014-11-20 DIAGNOSIS — E559 Vitamin D deficiency, unspecified: Secondary | ICD-10-CM | POA: Diagnosis not present

## 2014-11-20 DIAGNOSIS — E119 Type 2 diabetes mellitus without complications: Secondary | ICD-10-CM | POA: Diagnosis not present

## 2014-11-20 DIAGNOSIS — R609 Edema, unspecified: Secondary | ICD-10-CM | POA: Diagnosis not present

## 2014-11-20 DIAGNOSIS — R5382 Chronic fatigue, unspecified: Secondary | ICD-10-CM | POA: Diagnosis not present

## 2014-11-20 DIAGNOSIS — J45909 Unspecified asthma, uncomplicated: Secondary | ICD-10-CM | POA: Diagnosis not present

## 2014-11-20 DIAGNOSIS — J209 Acute bronchitis, unspecified: Secondary | ICD-10-CM | POA: Diagnosis not present

## 2014-11-20 DIAGNOSIS — E785 Hyperlipidemia, unspecified: Secondary | ICD-10-CM | POA: Diagnosis not present

## 2014-11-30 DIAGNOSIS — F332 Major depressive disorder, recurrent severe without psychotic features: Secondary | ICD-10-CM | POA: Diagnosis not present

## 2014-12-12 DIAGNOSIS — R5382 Chronic fatigue, unspecified: Secondary | ICD-10-CM | POA: Diagnosis not present

## 2014-12-12 DIAGNOSIS — E119 Type 2 diabetes mellitus without complications: Secondary | ICD-10-CM | POA: Diagnosis not present

## 2014-12-12 DIAGNOSIS — G4700 Insomnia, unspecified: Secondary | ICD-10-CM | POA: Diagnosis not present

## 2014-12-12 DIAGNOSIS — N390 Urinary tract infection, site not specified: Secondary | ICD-10-CM | POA: Diagnosis not present

## 2014-12-13 DIAGNOSIS — N3020 Other chronic cystitis without hematuria: Secondary | ICD-10-CM | POA: Diagnosis not present

## 2015-01-04 DIAGNOSIS — R350 Frequency of micturition: Secondary | ICD-10-CM | POA: Diagnosis not present

## 2015-01-04 DIAGNOSIS — N3020 Other chronic cystitis without hematuria: Secondary | ICD-10-CM | POA: Diagnosis not present

## 2015-01-04 DIAGNOSIS — R351 Nocturia: Secondary | ICD-10-CM | POA: Diagnosis not present

## 2015-01-23 ENCOUNTER — Telehealth: Payer: Self-pay | Admitting: *Deleted

## 2015-01-23 NOTE — Telephone Encounter (Signed)
Called patient and left a voice mail to return my call regarding Hep C referral. She will need to schedule a lab appt, and will then be added to Dr. Ephriam Knuckles schedule in June. Wendall Mola

## 2015-02-07 ENCOUNTER — Other Ambulatory Visit: Payer: Medicare Other

## 2015-02-07 DIAGNOSIS — B182 Chronic viral hepatitis C: Secondary | ICD-10-CM

## 2015-02-07 LAB — IRON: Iron: 235 ug/dL — ABNORMAL HIGH (ref 42–145)

## 2015-02-08 LAB — HEPATITIS B CORE ANTIBODY, TOTAL: Hep B Core Total Ab: NONREACTIVE

## 2015-02-08 LAB — HEPATITIS B SURFACE ANTIGEN: HEP B S AG: NEGATIVE

## 2015-02-08 LAB — HEPATITIS B SURFACE ANTIBODY,QUALITATIVE: Hep B S Ab: NEGATIVE

## 2015-02-08 LAB — PROTIME-INR
INR: 1.02 (ref <1.50)
PROTHROMBIN TIME: 13.4 s (ref 11.6–15.2)

## 2015-02-08 LAB — HIV ANTIBODY (ROUTINE TESTING W REFLEX): HIV 1&2 Ab, 4th Generation: NONREACTIVE

## 2015-02-08 LAB — HEPATITIS C RNA QUANTITATIVE
HCV QUANT: 200914 [IU]/mL — AB (ref <15)
HCV Quantitative Log: 5.3 {Log} — ABNORMAL HIGH (ref <1.18)

## 2015-02-08 LAB — HEPATITIS A ANTIBODY, TOTAL: Hep A Total Ab: REACTIVE — AB

## 2015-02-08 LAB — ANA: ANA: NEGATIVE

## 2015-02-13 LAB — HEPATITIS C GENOTYPE

## 2015-02-22 ENCOUNTER — Telehealth: Payer: Self-pay | Admitting: *Deleted

## 2015-02-22 NOTE — Telephone Encounter (Signed)
Called patient and left a voicemail to call the clinic to schedule an appt with Dr. Luciana Axe in July for Hep C treatment. Susan Hogan

## 2015-03-13 DIAGNOSIS — E119 Type 2 diabetes mellitus without complications: Secondary | ICD-10-CM | POA: Diagnosis not present

## 2015-03-13 DIAGNOSIS — I10 Essential (primary) hypertension: Secondary | ICD-10-CM | POA: Diagnosis not present

## 2015-03-13 DIAGNOSIS — R5382 Chronic fatigue, unspecified: Secondary | ICD-10-CM | POA: Diagnosis not present

## 2015-03-13 DIAGNOSIS — M545 Low back pain: Secondary | ICD-10-CM | POA: Diagnosis not present

## 2015-03-14 NOTE — Telephone Encounter (Signed)
Called patient and left voice mail x 3 to schedule an appt with Dr. Luciana Axe for Hep C treatment and follow up on labs. No return call; referring provider Lerry Liner, MD notified. Susan Hogan

## 2015-08-08 ENCOUNTER — Encounter (HOSPITAL_COMMUNITY): Payer: Self-pay | Admitting: Emergency Medicine

## 2015-08-08 ENCOUNTER — Emergency Department (HOSPITAL_COMMUNITY)
Admission: EM | Admit: 2015-08-08 | Discharge: 2015-08-09 | Disposition: A | Discharge status: Home / Self Care | Payer: Medicare Other | Attending: Emergency Medicine | Admitting: Emergency Medicine

## 2015-08-08 DIAGNOSIS — R197 Diarrhea, unspecified: Secondary | ICD-10-CM | POA: Diagnosis not present

## 2015-08-08 DIAGNOSIS — Z3202 Encounter for pregnancy test, result negative: Secondary | ICD-10-CM | POA: Diagnosis not present

## 2015-08-08 DIAGNOSIS — J45909 Unspecified asthma, uncomplicated: Secondary | ICD-10-CM | POA: Diagnosis not present

## 2015-08-08 DIAGNOSIS — Z8659 Personal history of other mental and behavioral disorders: Secondary | ICD-10-CM | POA: Insufficient documentation

## 2015-08-08 DIAGNOSIS — R011 Cardiac murmur, unspecified: Secondary | ICD-10-CM | POA: Insufficient documentation

## 2015-08-08 DIAGNOSIS — Z7984 Long term (current) use of oral hypoglycemic drugs: Secondary | ICD-10-CM | POA: Insufficient documentation

## 2015-08-08 DIAGNOSIS — R21 Rash and other nonspecific skin eruption: Secondary | ICD-10-CM | POA: Insufficient documentation

## 2015-08-08 DIAGNOSIS — Z8619 Personal history of other infectious and parasitic diseases: Secondary | ICD-10-CM | POA: Diagnosis not present

## 2015-08-08 DIAGNOSIS — Z8739 Personal history of other diseases of the musculoskeletal system and connective tissue: Secondary | ICD-10-CM | POA: Insufficient documentation

## 2015-08-08 DIAGNOSIS — Z87442 Personal history of urinary calculi: Secondary | ICD-10-CM | POA: Insufficient documentation

## 2015-08-08 DIAGNOSIS — M549 Dorsalgia, unspecified: Secondary | ICD-10-CM | POA: Insufficient documentation

## 2015-08-08 DIAGNOSIS — Z79899 Other long term (current) drug therapy: Secondary | ICD-10-CM | POA: Diagnosis not present

## 2015-08-08 DIAGNOSIS — Z862 Personal history of diseases of the blood and blood-forming organs and certain disorders involving the immune mechanism: Secondary | ICD-10-CM | POA: Diagnosis not present

## 2015-08-08 DIAGNOSIS — K219 Gastro-esophageal reflux disease without esophagitis: Secondary | ICD-10-CM

## 2015-08-08 DIAGNOSIS — I10 Essential (primary) hypertension: Secondary | ICD-10-CM | POA: Insufficient documentation

## 2015-08-08 DIAGNOSIS — R110 Nausea: Secondary | ICD-10-CM | POA: Diagnosis present

## 2015-08-08 DIAGNOSIS — Z8679 Personal history of other diseases of the circulatory system: Secondary | ICD-10-CM | POA: Insufficient documentation

## 2015-08-08 DIAGNOSIS — Z880 Allergy status to penicillin: Secondary | ICD-10-CM | POA: Insufficient documentation

## 2015-08-08 DIAGNOSIS — Z8669 Personal history of other diseases of the nervous system and sense organs: Secondary | ICD-10-CM | POA: Diagnosis not present

## 2015-08-08 DIAGNOSIS — E119 Type 2 diabetes mellitus without complications: Secondary | ICD-10-CM | POA: Insufficient documentation

## 2015-08-08 LAB — CBC WITH DIFFERENTIAL/PLATELET
Basophils Absolute: 0 10*3/uL (ref 0.0–0.1)
Basophils Relative: 0 %
EOS ABS: 0.1 10*3/uL (ref 0.0–0.7)
Eosinophils Relative: 1 %
HEMATOCRIT: 46.5 % — AB (ref 36.0–46.0)
HEMOGLOBIN: 16 g/dL — AB (ref 12.0–15.0)
LYMPHS PCT: 29 %
Lymphs Abs: 1.7 10*3/uL (ref 0.7–4.0)
MCH: 33.8 pg (ref 26.0–34.0)
MCHC: 34.4 g/dL (ref 30.0–36.0)
MCV: 98.3 fL (ref 78.0–100.0)
MONO ABS: 0.4 10*3/uL (ref 0.1–1.0)
Monocytes Relative: 7 %
Neutro Abs: 3.8 10*3/uL (ref 1.7–7.7)
Neutrophils Relative %: 63 %
Platelets: 153 10*3/uL (ref 150–400)
RBC: 4.73 MIL/uL (ref 3.87–5.11)
RDW: 12.8 % (ref 11.5–15.5)
WBC: 6 10*3/uL (ref 4.0–10.5)

## 2015-08-08 LAB — COMPREHENSIVE METABOLIC PANEL
ALT: 164 U/L — ABNORMAL HIGH (ref 14–54)
AST: 128 U/L — AB (ref 15–41)
Albumin: 3.5 g/dL (ref 3.5–5.0)
Alkaline Phosphatase: 70 U/L (ref 38–126)
Anion gap: 9 (ref 5–15)
BILIRUBIN TOTAL: 0.5 mg/dL (ref 0.3–1.2)
BUN: 10 mg/dL (ref 6–20)
CO2: 22 mmol/L (ref 22–32)
Calcium: 8.9 mg/dL (ref 8.9–10.3)
Chloride: 109 mmol/L (ref 101–111)
Creatinine, Ser: 0.84 mg/dL (ref 0.44–1.00)
GFR calc Af Amer: >60 mL/min (ref >60)
GLUCOSE: 145 mg/dL — AB (ref 65–99)
Potassium: 3.8 mmol/L (ref 3.5–5.1)
Sodium: 140 mmol/L (ref 135–145)
TOTAL PROTEIN: 6.3 g/dL — AB (ref 6.5–8.1)

## 2015-08-08 LAB — LIPASE, BLOOD: Lipase: 34 U/L (ref 11–51)

## 2015-08-08 LAB — URINE MICROSCOPIC-ADD ON
Bacteria, UA: NONE SEEN
RBC / HPF: NONE SEEN RBC/hpf (ref 0–5)
WBC, UA: NONE SEEN WBC/hpf (ref 0–5)

## 2015-08-08 LAB — URINALYSIS, ROUTINE W REFLEX MICROSCOPIC
BILIRUBIN URINE: NEGATIVE
GLUCOSE, UA: NEGATIVE mg/dL
Ketones, ur: NEGATIVE mg/dL
Nitrite: NEGATIVE
Protein, ur: NEGATIVE mg/dL
Specific Gravity, Urine: 1.021 (ref 1.005–1.030)
pH: 5.5 (ref 5.0–8.0)

## 2015-08-08 LAB — CBG MONITORING, ED: GLUCOSE-CAPILLARY: 143 mg/dL — AB (ref 65–99)

## 2015-08-08 MED ORDER — GI COCKTAIL ~~LOC~~
30.0000 mL | Freq: Once | ORAL | Status: AC
  Administered 2015-08-08: 30 mL via ORAL
  Filled 2015-08-08: qty 30

## 2015-08-08 MED ORDER — ONDANSETRON 4 MG PO TBDP
4.0000 mg | ORAL_TABLET | Freq: Once | ORAL | Status: AC
  Administered 2015-08-08: 4 mg via ORAL
  Filled 2015-08-08: qty 1

## 2015-08-08 NOTE — ED Provider Notes (Signed)
By signing my name below, I, Susan Hogan, attest that this documentation has been prepared under the direction and in the presence of Susan Hogan N Jamesrobert Ohanesian, DO. Electronically Signed: [name], Scribe. 08/08/2015. 11:43 PM.   TIME SEEN: 11:25 PM  CHIEF COMPLAINT: Rash, Nausea, Diarrhea   HPI: Pt is a 54 y.o. female with history of hepatitis C, diabetes, fibromyalgia who presents to emergency department with multiple complaints. Pt reports to the ED complaining of an itchy rash on her hands and legs bilaterally.   Pt states she has been taking imodium with mild relief.  She  reports that she recently has been sleeping on a brand new mattress.  She denies use of any new lotions, detergents, or medications.    She reports she has had 3 days of nausea without vomiting, and diarrhea. No bloody stools or melena. No dysuria or hematuria. No vaginal bleeding or discharge.  Pt also reports back pain, periumbilical abdominal pain and hot and cold flashes.  She states that she has more than eight episodes of diarrhea a day and that she usually has episodes of diarrhea following consumption of foods and liquids.  She denies recent travel outside of the country, sick contacts.  No recent hospitalization or antibiotic use. Pt has had a BTL.  She states she has not had a period in 10 years but is concerned she could be pregnant because she is not sexually active.  Also complaining of burning in the epigastric region worse after eating. Has been told she has gastritis in the past. Denies NSAID or alcohol use.   PCP: Dr. Mayford Knife  ROS: See HPI Constitutional: no fever  Eyes: no drainage  ENT: no runny nose   Cardiovascular:  no chest pain  Resp: no SOB  GI: no vomiting GU: no dysuria Integumentary: no rash  Allergy: no hives  Musculoskeletal: no leg swelling  Neurological: no slurred speech ROS otherwise negative  PAST MEDICAL HISTORY/PAST SURGICAL HISTORY:  Past Medical History   Diagnosis Date  . Fibromyalgia   . Diabetes mellitus   . Heart murmur   . Asthma   . Shortness of breath   . Headache(784.0)     migraines  . Depression   . Recurrent upper respiratory infection (URI)   . Complication of anesthesia     difficulty breathing  . Kidney stones     with hematuria  . Carpal tunnel syndrome     bilaterally  . Arthritis     RA "states does not see Rheumatologist"  . Irritable bowel syndrome   . Degenerative disk disease     back  . Kidney stone     with hematuria  . Hypertension     no medication for at this time, sees Dr. Estevan Oaks Pcp  . Hypotensive episode     hx of  . Hepatitis     In followup treatment for hepatitis C  . Anemia   . GERD (gastroesophageal reflux disease)   . Syncope     MEDICATIONS:  Prior to Admission medications   Medication Sig Start Date End Date Taking? Authorizing Provider  albuterol (PROVENTIL HFA;VENTOLIN HFA) 108 (90 BASE) MCG/ACT inhaler Inhale 2 puffs into the lungs every 4 (four) hours as needed for wheezing or shortness of breath.    Historical Provider, MD  cyanocobalamin (,VITAMIN B-12,) 1000 MCG/ML injection Inject 1,000 mcg into the muscle every 30 (thirty) days.    Historical Provider, MD  metFORMIN (GLUCOPHAGE) 500 MG tablet Take  1 tablet (500 mg total) by mouth 2 (two) times daily. 03/22/13   Thermon Leyland, NP  omeprazole (PRILOSEC) 20 MG capsule Take 20 mg by mouth daily.    Historical Provider, MD    ALLERGIES:  Allergies  Allergen Reactions  . Anesthetic Ether [Ether] Shortness Of Breath    IV anesthesia causes extreme pain and tightness in back and ribcage.  . Nubain [Nalbuphine Hcl] Anaphylaxis  . Penicillins Hives and Shortness Of Breath  . Advair Diskus [Fluticasone-Salmeterol] Hives  . Lactose Intolerance (Gi) Diarrhea    SOCIAL HISTORY:  Social History  Substance Use Topics  . Smoking status: Never Smoker   . Smokeless tobacco: Never Used  . Alcohol Use: No    FAMILY  HISTORY: Family History  Problem Relation Age of Onset  . Heart disease Mother   . Diabetes Mother   . Asthma Mother   . Anemia Mother   . Colon cancer Maternal Uncle   . Heart disease Maternal Uncle   . Heart disease Maternal Grandmother   . Diabetes Maternal Grandmother   . Asthma Maternal Grandmother     EXAM: BP 102/49 mmHg  Pulse 62  Temp(Src) 98.3 F (36.8 C) (Oral)  Resp 18  SpO2 94% CONSTITUTIONAL: Alert and oriented and responds appropriately to questions. Well-appearing; well-nourished, afebrile, nontoxic appearing HEAD: Normocephalic EYES: Conjunctivae clear, PERRL ENT: normal nose; no rhinorrhea; moist mucous membranes; pharynx without lesions noted NECK: Supple, no meningismus, no LAD  CARD: RRR; S1 and S2 appreciated; no murmurs, no clicks, no rubs, no gallops RESP: Normal chest excursion without splinting or tachypnea; breath sounds clear and equal bilaterally; no wheezes, no rhonchi, no rales, no hypoxia or respiratory distress, speaking full sentences ABD/GI: Normal bowel sounds; non-distended; soft, non-tender, no rebound, no guarding, no peritoneal signs BACK:  The back appears normal and is non-tender to palpation, there is no CVA tenderness EXT: Normal ROM in all joints; non-tender to palpation; no edema; normal capillary refill; no cyanosis, no calf tenderness or swelling    SKIN: Normal color for age and race; warm, multiple punctate, erythematous paupular lesions, some with scabs, no blisters or desquamation, nothing on her palms or soles but she did have some lesions in between her toes, no surrounding signs of cellulitis, no fluctuance, no drainage, no petechiae or purpura NEURO: Moves all extremities equally, sensation to light touch intact diffusely, cranial nerves II through XII intact PSYCH: The patient's mood and manner are appropriate. Grooming and personal hygiene are appropriate.  MEDICAL DECISION MAKING: Patient here with multiple complaints. Her  abdominal exam is benign. Suspect that her burning pain is related to gastritis, GERD. Will give GI cocktail and reassess. She does have a history of hepatitis C. Her AST and ALT are chronically elevated and this is unchanged. Lipase is normal. Urine shows trace leukocyte and small hemoglobin but no other sign of infection. She is not having any urinary symptoms. I do not think this needs to be treated at this time. Urine pregnancy test is negative. I do not feel she needs emergent abdominal imaging at this time.   Suspect her rash may be from bedbugs but there are some lesions that look like they could be scabies. No life-threatening rash on exam. Will discharge with permethrin cream.  ED PROGRESS: Patient reports feeling better after GI cocktail and Protonix. She is drinking without difficulty. Her abdominal exam still benign. Will discharge with outpatient follow-up information. Discussed with patient at this time I do not feel  she needs to be on antibiotics. I feel that her abdominal pain is likely from gastritis and her nausea and diarrhea may be from a viral illness. I discussed with her return precautions. She verbalizes understanding and is comfortable with this plan.   I personally performed the services described in this documentation, which was scribed in my presence. The recorded information has been reviewed and is accurate.    Layla Maw Daivd Fredericksen, DO 08/09/15 [phone]

## 2015-08-08 NOTE — ED Notes (Signed)
Pt. reports nausea , diarrhea and itch rashes at hands and legs onset 3 days ago , denies fever , mild chills.

## 2015-08-09 DIAGNOSIS — K219 Gastro-esophageal reflux disease without esophagitis: Secondary | ICD-10-CM | POA: Diagnosis not present

## 2015-08-09 LAB — PREGNANCY, URINE: PREG TEST UR: NEGATIVE

## 2015-08-09 MED ORDER — DIPHENOXYLATE-ATROPINE 2.5-0.025 MG PO TABS: 1.0000 | ORAL_TABLET | Freq: Four times a day (QID) | ORAL | Status: DC | PRN

## 2015-08-09 MED ORDER — PANTOPRAZOLE SODIUM 40 MG PO TBEC: 40.0000 mg | DELAYED_RELEASE_TABLET | Freq: Every day | ORAL | Status: DC

## 2015-08-09 MED ORDER — PERMETHRIN 5 % EX CREA: TOPICAL_CREAM | CUTANEOUS | Status: DC

## 2015-08-09 MED ORDER — ONDANSETRON 4 MG PO TBDP: 4.0000 mg | ORAL_TABLET | Freq: Three times a day (TID) | ORAL | Status: DC | PRN

## 2015-08-09 MED ORDER — PANTOPRAZOLE SODIUM 40 MG PO TBEC
40.0000 mg | DELAYED_RELEASE_TABLET | Freq: Once | ORAL | Status: AC
  Administered 2015-08-09: 40 mg via ORAL
  Filled 2015-08-09: qty 1

## 2015-08-09 NOTE — Discharge Instructions (Signed)
Diarrhea Diarrhea is frequent loose and watery bowel movements. It can cause you to feel weak and dehydrated. Dehydration can cause you to become tired and thirsty, have a dry mouth, and have decreased urination that often is dark yellow. Diarrhea is a sign of another problem, most often an infection that will not last long. In most cases, diarrhea typically lasts 2-3 days. However, it can last longer if it is a sign of something more serious. It is important to treat your diarrhea as directed by your caregiver to lessen or prevent future episodes of diarrhea. CAUSES  Some common causes include:  Gastrointestinal infections caused by viruses, bacteria, or parasites.  Food poisoning or food allergies.  Certain medicines, such as antibiotics, chemotherapy, and laxatives.  Artificial sweeteners and fructose.  Digestive disorders. HOME CARE INSTRUCTIONS  Ensure adequate fluid intake (hydration): Have 1 cup (8 oz) of fluid for each diarrhea episode. Avoid fluids that contain simple sugars or sports drinks, fruit juices, whole milk products, and sodas. Your urine should be clear or pale yellow if you are drinking enough fluids. Hydrate with an oral rehydration solution that you can purchase at pharmacies, retail stores, and online. You can prepare an oral rehydration solution at home by mixing the following ingredients together:   - tsp table salt.   tsp baking soda.   tsp salt substitute containing potassium chloride.  1  tablespoons sugar.  1 L (34 oz) of water.  Certain foods and beverages may increase the speed at which food moves through the gastrointestinal (GI) tract. These foods and beverages should be avoided and include:  Caffeinated and alcoholic beverages.  High-fiber foods, such as raw fruits and vegetables, nuts, seeds, and whole grain breads and cereals.  Foods and beverages sweetened with sugar alcohols, such as xylitol, sorbitol, and mannitol.  Some foods may be well  tolerated and may help thicken stool including:  Starchy foods, such as rice, toast, pasta, low-sugar cereal, oatmeal, grits, baked potatoes, crackers, and bagels.  Bananas.  Applesauce.  Add probiotic-rich foods to help increase healthy bacteria in the GI tract, such as yogurt and fermented milk products.  Wash your hands well after each diarrhea episode.  Only take over-the-counter or prescription medicines as directed by your caregiver.  Take a warm bath to relieve any burning or pain from frequent diarrhea episodes. SEEK IMMEDIATE MEDICAL CARE IF:   You are unable to keep fluids down.  You have persistent vomiting.  You have blood in your stool, or your stools are black and tarry.  You do not urinate in 6-8 hours, or there is only a small amount of very dark urine.  You have abdominal pain that increases or localizes.  You have weakness, dizziness, confusion, or light-headedness.  You have a severe headache.  Your diarrhea gets worse or does not get better.  You have a fever or persistent symptoms for more than 2-3 days.  You have a fever and your symptoms suddenly get worse. MAKE SURE YOU:   Understand these instructions.  Will watch your condition.  Will get help right away if you are not doing well or get worse.   This information is not intended to replace advice given to you by your health care provider. Make sure you discuss any questions you have with your health care provider.   Document Released: 08/28/2002 Document Revised: 09/28/2014 Document Reviewed: 05/15/2012 Elsevier Interactive Patient Education 2016 [employer].  Food Choices for Gastroesophageal Reflux Disease, Adult When you have gastroesophageal  reflux disease (GERD), the foods you eat and your eating habits are very important. Choosing the right foods can help ease the discomfort of GERD. WHAT GENERAL GUIDELINES DO I NEED TO FOLLOW?  Choose fruits, vegetables, whole grains, low-fat  dairy products, and low-fat meat, fish, and poultry.  Limit fats such as oils, salad dressings, butter, nuts, and avocado.  Keep a food diary to identify foods that cause symptoms.  Avoid foods that cause reflux. These may be different for different people.  Eat frequent small meals instead of three large meals each day.  Eat your meals slowly, in a relaxed setting.  Limit fried foods.  Cook foods using methods other than frying.  Avoid drinking alcohol.  Avoid drinking large amounts of liquids with your meals.  Avoid bending over or lying down until 2-3 hours after eating. WHAT FOODS ARE NOT RECOMMENDED? The following are some foods and drinks that may worsen your symptoms: Vegetables Tomatoes. Tomato juice. Tomato and spaghetti sauce. Chili peppers. Onion and garlic. Horseradish. Fruits Oranges, grapefruit, and lemon (fruit and juice). Meats High-fat meats, fish, and poultry. This includes hot dogs, ribs, ham, sausage, salami, and bacon. Dairy Whole milk and chocolate milk. Sour cream. Cream. Butter. Ice cream. Cream cheese.  Beverages Coffee and tea, with or without caffeine. Carbonated beverages or energy drinks. Condiments Hot sauce. Barbecue sauce.  Sweets/Desserts Chocolate and cocoa. Donuts. Peppermint and spearmint. Fats and Oils High-fat foods, including [place] fries and potato chips. Other Vinegar. Strong spices, such as black pepper, white pepper, red pepper, cayenne, curry powder, cloves, ginger, and chili powder. The items listed above may not be a complete list of foods and beverages to avoid. Contact your dietitian for more information.   This information is not intended to replace advice given to you by your health care provider. Make sure you discuss any questions you have with your health care provider.   Document Released: 09/07/2005 Document Revised: 09/28/2014 Document Reviewed: 07/12/2013 Elsevier Interactive Patient Education 2016 [employer]  Inc[employer]  Gastroesophageal Reflux Disease, Adult Normally, food travels down the esophagus and stays in the stomach to be digested. However, when a person has gastroesophageal reflux disease (GERD), food and stomach acid move back up into the esophagus. When this happens, the esophagus becomes sore and inflamed. Over time, GERD can create small holes (ulcers) in the lining of the esophagus.  CAUSES This condition is caused by a problem with the muscle between the esophagus and the stomach (lower esophageal sphincter, or LES). Normally, the LES muscle closes after food passes through the esophagus to the stomach. When the LES is weakened or abnormal, it does not close properly, and that allows food and stomach acid to go back up into the esophagus. The LES can be weakened by certain dietary substances, medicines, and medical conditions, including:  Tobacco use.  Pregnancy.  Having a hiatal hernia.  Heavy alcohol use.  Certain foods and beverages, such as coffee, chocolate, onions, and peppermint. RISK FACTORS This condition is more likely to develop in:  People who have an increased body weight.  People who have connective tissue disorders.  People who use NSAID medicines. SYMPTOMS Symptoms of this condition include:  Heartburn.  Difficult or painful swallowing.  The feeling of having a lump in the throat.  Abitter taste in the mouth.  Bad breath.  Having a large amount of saliva.  Having an upset or bloated stomach.  Belching.  Chest pain.  Shortness of breath or wheezing.  Ongoing (chronic) cough  or a night-time cough.  Wearing away of tooth enamel.  Weight loss. Different conditions can cause chest pain. Make sure to see your health care provider if you experience chest pain. DIAGNOSIS Your health care provider will take a medical history and perform a physical exam. To determine if you have mild or severe GERD, your health care provider may also monitor how you  respond to treatment. You may also have other tests, including:  An endoscopy toexamine your stomach and esophagus with a small camera.  A test thatmeasures the acidity level in your esophagus.  A test thatmeasures how much pressure is on your esophagus.  A barium swallow or modified barium swallow to show the shape, size, and functioning of your esophagus. TREATMENT The goal of treatment is to help relieve your symptoms and to prevent complications. Treatment for this condition may vary depending on how severe your symptoms are. Your health care provider may recommend:  Changes to your diet.  Medicine.  Surgery. HOME CARE INSTRUCTIONS Diet  Follow a diet as recommended by your health care provider. This may involve avoiding foods and drinks such as:  Coffee and tea (with or without caffeine).  Drinks that containalcohol.  Energy drinks and sports drinks.  Carbonated drinks or sodas.  Chocolate and cocoa.  Peppermint and mint flavorings.  Garlic and onions.  Horseradish.  Spicy and acidic foods, including peppers, chili powder, curry powder, vinegar, hot sauces, and barbecue sauce.  Citrus fruit juices and citrus fruits, such as oranges, lemons, and limes.  Tomato-based foods, such as red sauce, chili, salsa, and pizza with red sauce.  Fried and fatty foods, such as donuts, french fries, potato chips, and high-fat dressings.  High-fat meats, such as hot dogs and fatty cuts of red and white meats, such as rib eye steak, sausage, ham, and bacon.  High-fat dairy items, such as whole milk, butter, and cream cheese.  Eat small, frequent meals instead of large meals.  Avoid drinking large amounts of liquid with your meals.  Avoid eating meals during the 2-3 hours before bedtime.  Avoid lying down right after you eat.  Do not exercise right after you eat. General Instructions  Pay attention to any changes in your symptoms.  Take over-the-counter and  prescription medicines only as told by your health care provider. Do not take aspirin, ibuprofen, or other NSAIDs unless your health care provider told you to do so.  Do not use any tobacco products, including cigarettes, chewing tobacco, and e-cigarettes. If you need help quitting, ask your health care provider.  Wear loose-fitting clothing. Do not wear anything tight around your waist that causes pressure on your abdomen.  Raise (elevate) the head of your bed 6 inches (15cm).  Try to reduce your stress, such as with yoga or meditation. If you need help reducing stress, ask your health care provider.  If you are overweight, reduce your weight to an amount that is healthy for you. Ask your health care provider for guidance about a safe weight loss goal.  Keep all follow-up visits as told by your health care provider. This is important. SEEK MEDICAL CARE IF:  You have new symptoms.  You have unexplained weight loss.  You have difficulty swallowing, or it hurts to swallow.  You have wheezing or a persistent cough.  Your symptoms do not improve with treatment.  You have a hoarse voice. SEEK IMMEDIATE MEDICAL CARE IF:  You have pain in your arms, neck, jaw, teeth, or back.  You feel sweaty, dizzy, or light-headed.  You have chest pain or shortness of breath.  You vomit and your vomit looks like blood or coffee grounds.  You faint.  Your stool is bloody or black.  You cannot swallow, drink, or eat.   This information is not intended to replace advice given to you by your health care provider. Make sure you discuss any questions you have with your health care provider.   Document Released: 06/17/2005 Document Revised: 05/29/2015 Document Reviewed: 01/02/2015 Elsevier Interactive Patient Education 2016 [employer]. Possible Bedbugs Bedbugs are tiny bugs that live in and around beds. They stay hidden during the day, and they come out at night and bite. Bedbugs need blood to  live and grow. WHERE ARE BEDBUGS FOUND? Bedbugs can be found anywhere, whether a place is clean or dirty. They are most often found in places where many people come and go, such as hotels, shelters, dorms, and health care settings. It is also common for them to be found in homes where there are many birds or bats nearby. WHAT ARE BEDBUG BITES LIKE? A bedbug bite leaves a small red bump with a darker red dot in the middle. The bump may appear soon after a person is bitten or a day or more later. Bedbug bites usually do not hurt, but they may itch. Most people do not need treatment for bedbug bites. The bumps usually go away on their own in a few days. HOW DO I CHECK FOR BEDBUGS? Bedbugs are reddish-brown, oval, and flat. They range in size from 1 mm to 7 mm and they cannot fly. Look for bedbugs in these places:  On mattresses, bed frames, headboards, and box springs.  On drapes and curtains in bedrooms.  Under carpeting in bedrooms.  Behind electrical outlets.  Behind any wallpaper that is peeling.  Inside luggage. Also look for black or red spots or stains on or near the bed. Stains can come from bedbugs that have been crushed or from bedbug waste. WHAT SHOULD I DO IF I FIND BEDBUGS? When Traveling If you find bedbugs while traveling, check all of your possessions carefully before you bring them into your home. Consider throwing away anything that has bedbugs on it. At Home If you find bedbugs at home, your bedroom may need to be treated by a pest control expert. You may also need to throw away mattresses or luggage. To help keep bedbugs from coming back, consider taking these actions:  Put a plastic cover over your mattress.  Wash your clothes and bedding in water that is hotter than 120F (48.9C) and dry them on a hot setting. Bedbugs are killed by high temperatures.  Vacuum often around the bed and in all of the cracks and crevices where the bugs might hide.  Carefully check all  used furniture, bedding, or clothes that you bring into your home.  Eliminate bird nests and bat roosts that are near your home. In Your Bed If you find bedbugs in your bed, consider wearing pajamas that have long sleeves and pant legs. Bedbugs usually bite areas of the skin that are not covered.   This information is not intended to replace advice given to you by your health care provider. Make sure you discuss any questions you have with your health care provider.   Document Released: 10/10/2010 Document Revised: 01/22/2015 Document Reviewed: 09/03/2014 Elsevier Interactive Patient Education 2016 Else[employer] Inc.    Possible Scabies, Adult Scabies is a skin condition that happens  when very small insects get under the skin (infestation). This causes a rash and severe itchiness. Scabies can spread from person to person (is contagious). If you get scabies, it is common for others in your household to get scabies too. With proper treatment, symptoms usually go away in 2-4 weeks. Scabies usually does not cause lasting problems. CAUSES This condition is caused by mites (Sarcoptes scabiei, or human itch mites) that can only be seen with a microscope. The mites get into the top layer of skin and lay eggs. Scabies can spread from person to person through:  Close contact with a person who has scabies.  Contact with infested items, such as towels, bedding, or clothing. RISK FACTORS This condition is more likely to develop in:  People who live in nursing homes and other extended-care facilities.  People who have sexual contact with a partner who has scabies.  Young children who attend child care facilities.  People who care for others who are at increased risk for scabies. SYMPTOMS Symptoms of this condition may include:  Severe itchiness. This is often worse at night.  A rash that includes tiny red bumps or blisters. The rash commonly occurs on the wrist, elbow, armpit, fingers, waist,  groin, or buttocks. Bumps may form a line (burrow) in some areas.  Skin irritation. This can include scaly patches or sores. DIAGNOSIS This condition is diagnosed with a physical exam. Your health care provider will look closely at your skin. In some cases, your health care provider may take a sample of your affected skin (skin scraping) and have it examined under a microscope. TREATMENT This condition may be treated with:  Medicated cream or lotion that kills the mites. This is spread on the entire body and left on for several hours. Usually, one treatment with medicated cream or lotion is enough to kill all of the mites. In severe cases, the treatment may be repeated.  Medicated cream that relieves itching.  Medicines that help to relieve itching.  Medicines that kill the mites. This treatment is rarely used. HOME CARE INSTRUCTIONS Medicines  Take or apply over-the-counter and prescription medicines as told by your health care provider.  Apply medicated cream or lotion as told by your health care provider.  Do not wash off the medicated cream or lotion until the necessary amount of time has passed. Skin Care  Avoid scratching your affected skin.  Keep your fingernails closely trimmed to reduce injury from scratching.  Take cool baths or apply cool washcloths to help reduce itching. General Instructions  Clean all items that you recently had contact with, including bedding, clothing, and furniture. Do this on the same day that your treatment starts.  Use hot water when you wash items.  Place unwashable items into closed, airtight plastic bags for at least 3 days. The mites cannot live for more than 3 days away from human skin.  Vacuum furniture and mattresses that you use.  Make sure that other people who may have been infested are examined by a health care provider. These include members of your household and anyone who may have had contact with infested items.  Keep all  follow-up visits as told by your health care provider. This is important. SEEK MEDICAL CARE IF:  You have itching that does not go away after 4 weeks of treatment.  You continue to develop new bumps or burrows.  You have redness, swelling, or pain in your rash area after treatment.  You have fluid, blood, or  pus coming from your rash.   This information is not intended to replace advice given to you by your health care provider. Make sure you discuss any questions you have with your health care provider.   Document Released: 05/29/2015 Document Reviewed: 04/09/2015 Elsevier Interactive Patient Education [employer]

## 2015-09-18 ENCOUNTER — Emergency Department (HOSPITAL_COMMUNITY): Payer: Medicare Other

## 2015-09-18 ENCOUNTER — Encounter (HOSPITAL_COMMUNITY): Payer: Self-pay | Admitting: Emergency Medicine

## 2015-09-18 ENCOUNTER — Emergency Department (HOSPITAL_COMMUNITY)
Admission: EM | Admit: 2015-09-18 | Discharge: 2015-09-18 | Disposition: A | Discharge status: Home / Self Care | Payer: Medicare Other | Attending: Emergency Medicine | Admitting: Emergency Medicine

## 2015-09-18 DIAGNOSIS — Z79899 Other long term (current) drug therapy: Secondary | ICD-10-CM | POA: Insufficient documentation

## 2015-09-18 DIAGNOSIS — R1310 Dysphagia, unspecified: Secondary | ICD-10-CM | POA: Insufficient documentation

## 2015-09-18 DIAGNOSIS — K219 Gastro-esophageal reflux disease without esophagitis: Secondary | ICD-10-CM | POA: Insufficient documentation

## 2015-09-18 DIAGNOSIS — H9203 Otalgia, bilateral: Secondary | ICD-10-CM | POA: Insufficient documentation

## 2015-09-18 DIAGNOSIS — R197 Diarrhea, unspecified: Secondary | ICD-10-CM | POA: Diagnosis not present

## 2015-09-18 DIAGNOSIS — H5713 Ocular pain, bilateral: Secondary | ICD-10-CM | POA: Insufficient documentation

## 2015-09-18 DIAGNOSIS — R0789 Other chest pain: Secondary | ICD-10-CM | POA: Diagnosis not present

## 2015-09-18 DIAGNOSIS — Z8619 Personal history of other infectious and parasitic diseases: Secondary | ICD-10-CM | POA: Diagnosis not present

## 2015-09-18 DIAGNOSIS — Z3202 Encounter for pregnancy test, result negative: Secondary | ICD-10-CM | POA: Diagnosis not present

## 2015-09-18 DIAGNOSIS — Z880 Allergy status to penicillin: Secondary | ICD-10-CM | POA: Insufficient documentation

## 2015-09-18 DIAGNOSIS — I10 Essential (primary) hypertension: Secondary | ICD-10-CM | POA: Insufficient documentation

## 2015-09-18 DIAGNOSIS — D649 Anemia, unspecified: Secondary | ICD-10-CM | POA: Diagnosis not present

## 2015-09-18 DIAGNOSIS — R011 Cardiac murmur, unspecified: Secondary | ICD-10-CM | POA: Insufficient documentation

## 2015-09-18 DIAGNOSIS — E119 Type 2 diabetes mellitus without complications: Secondary | ICD-10-CM | POA: Insufficient documentation

## 2015-09-18 DIAGNOSIS — M1990 Unspecified osteoarthritis, unspecified site: Secondary | ICD-10-CM | POA: Insufficient documentation

## 2015-09-18 DIAGNOSIS — J45909 Unspecified asthma, uncomplicated: Secondary | ICD-10-CM | POA: Insufficient documentation

## 2015-09-18 DIAGNOSIS — R1010 Upper abdominal pain, unspecified: Secondary | ICD-10-CM | POA: Diagnosis present

## 2015-09-18 DIAGNOSIS — Z87442 Personal history of urinary calculi: Secondary | ICD-10-CM | POA: Diagnosis not present

## 2015-09-18 LAB — COMPREHENSIVE METABOLIC PANEL
ALBUMIN: 3.6 g/dL (ref 3.5–5.0)
ALK PHOS: 99 U/L (ref 38–126)
ALT: 180 U/L — AB (ref 14–54)
AST: 184 U/L — AB (ref 15–41)
Anion gap: 11 (ref 5–15)
BUN: 13 mg/dL (ref 6–20)
CALCIUM: 9.5 mg/dL (ref 8.9–10.3)
CHLORIDE: 106 mmol/L (ref 101–111)
CO2: 25 mmol/L (ref 22–32)
CREATININE: 0.7 mg/dL (ref 0.44–1.00)
GFR calc Af Amer: >60 mL/min (ref >60)
GFR calc non Af Amer: >60 mL/min (ref >60)
GLUCOSE: 116 mg/dL — AB (ref 65–99)
Potassium: 3.8 mmol/L (ref 3.5–5.1)
SODIUM: 142 mmol/L (ref 135–145)
Total Bilirubin: 0.6 mg/dL (ref 0.3–1.2)
Total Protein: 6.3 g/dL — ABNORMAL LOW (ref 6.5–8.1)

## 2015-09-18 LAB — CBC
HCT: 43.6 % (ref 36.0–46.0)
Hemoglobin: 15 g/dL (ref 12.0–15.0)
MCH: 33.7 pg (ref 26.0–34.0)
MCHC: 34.4 g/dL (ref 30.0–36.0)
MCV: 98 fL (ref 78.0–100.0)
PLATELETS: 190 10*3/uL (ref 150–400)
RBC: 4.45 MIL/uL (ref 3.87–5.11)
RDW: 12.7 % (ref 11.5–15.5)
WBC: 9 10*3/uL (ref 4.0–10.5)

## 2015-09-18 LAB — URINALYSIS, ROUTINE W REFLEX MICROSCOPIC
BILIRUBIN URINE: NEGATIVE
GLUCOSE, UA: NEGATIVE mg/dL
KETONES UR: NEGATIVE mg/dL
Nitrite: NEGATIVE
PROTEIN: NEGATIVE mg/dL
SPECIFIC GRAVITY, URINE: 1.022 (ref 1.005–1.030)
pH: 6 (ref 5.0–8.0)

## 2015-09-18 LAB — POC URINE PREG, ED: Preg Test, Ur: NEGATIVE

## 2015-09-18 LAB — URINE MICROSCOPIC-ADD ON

## 2015-09-18 LAB — LIPASE, BLOOD: Lipase: 28 U/L (ref 11–51)

## 2015-09-18 MED ORDER — GI COCKTAIL ~~LOC~~
30.0000 mL | Freq: Once | ORAL | Status: AC
  Administered 2015-09-18: 30 mL via ORAL
  Filled 2015-09-18: qty 30

## 2015-09-18 MED ORDER — SODIUM CHLORIDE 0.9 % IV BOLUS (SEPSIS)
1000.0000 mL | Freq: Once | INTRAVENOUS | Status: AC
  Administered 2015-09-18: 1000 mL via INTRAVENOUS

## 2015-09-18 MED ORDER — ONDANSETRON HCL 4 MG/2ML IJ SOLN
4.0000 mg | Freq: Once | INTRAMUSCULAR | Status: AC
  Administered 2015-09-18: 4 mg via INTRAVENOUS
  Filled 2015-09-18: qty 2

## 2015-09-18 MED ORDER — PANTOPRAZOLE SODIUM 40 MG PO TBEC
40.0000 mg | DELAYED_RELEASE_TABLET | Freq: Every day | ORAL | Status: DC
Start: 2015-09-18 — End: 2016-10-13

## 2015-09-18 NOTE — ED Notes (Signed)
Patient presents with multiple complaints.  States 3 days ago she started with diarrhea which has stopped at this time.  2 days ago started with mid abd pain (states she had gallstones several years ago and was given medicine to dissolve them and thinks they may have returned).  Also states she is having trouble swallowing and remembered that she had her esophagus stretched approximately 2 years ago and this is how she was feeling then.  Also c/o cough and feeling congested.

## 2015-09-18 NOTE — ED Notes (Signed)
Pt. presents with multiple complaints : 1) runny nose for 3 days , 2) diarrhea onset 2 days ago , 3) mid abdominal pain onset yesterday , 4) asthma attack with cough and  thick phlegm , 5) bilateral ear ache onset today , and 6) bilateral eyes " burning" as she walks in the triage room .

## 2015-09-18 NOTE — ED Provider Notes (Signed)
CSN: [id number]     Arrival date & time 09/18/15  1791 History   First MD Initiated Contact with Patient 09/18/15 0525     Chief Complaint  Patient presents with  . Abdominal Pain  . Asthma  . Otalgia  . Diarrhea  . Eye Pain      HPI Patient reports diarrhea over the past 3 days with mild decreased oral intake.  She had some nausea without vomiting.  She reports a mild discomfort in her upper abdomen with radiation up into her chest.  She denies shortness of breath.  She states for the past several days she's also had difficulty swallowing solids.  She can continue to swallow liquids feels like she is having some catching her upper esophagus.  She does have a history of esophageal dilation before.  She states this is beginning to feel similar to that episode.  She is post be on daily Protonix.  She does not take her Protonix.  She also reports some nasal congestion.   Past Medical History  Diagnosis Date  . Fibromyalgia   . Diabetes mellitus   . Heart murmur   . Asthma   . Shortness of breath   . Headache(784.0)     migraines  . Depression   . Recurrent upper respiratory infection (URI)   . Complication of anesthesia     difficulty breathing  . Kidney stones     with hematuria  . Carpal tunnel syndrome     bilaterally  . Arthritis     RA "states does not see Rheumatologist"  . Irritable bowel syndrome   . Degenerative disk disease     back  . Kidney stone     with hematuria  . Hypertension     no medication for at this time, sees Dr. Estevan Oaks Pcp  . Hypotensive episode     hx of  . Hepatitis     In followup treatment for hepatitis C  . Anemia   . GERD (gastroesophageal reflux disease)   . Syncope    Past Surgical History  Procedure Laterality Date  . Tubal ligation    . Dilation and curettage of uterus    . Liver biopsy    . Tonsillectomy     Family History  Problem Relation Age of Onset  . Heart disease Mother   . Diabetes Mother   . Asthma  Mother   . Anemia Mother   . Colon cancer Maternal Uncle   . Heart disease Maternal Uncle   . Heart disease Maternal Grandmother   . Diabetes Maternal Grandmother   . Asthma Maternal Grandmother    Social History  Substance Use Topics  . Smoking status: Never Smoker   . Smokeless tobacco: Never Used  . Alcohol Use: No   OB History    No data available     Review of Systems  All other systems reviewed and are negative.     Allergies  Anesthetic ether; Nubain; Penicillins; Advair diskus; and Lactose intolerance (gi)  Home Medications   Prior to Admission medications   Medication Sig Start Date End Date Taking? Authorizing Provider  Cyanocobalamin (VITAMIN B-12 PO) Take 1 tablet by mouth daily.   Yes Historical Provider, MD  ferrous sulfate 325 (65 FE) MG tablet Take 325 mg by mouth daily with breakfast.   Yes Historical Provider, MD  Multiple Vitamins-Minerals (HAIR/SKIN/NAILS) TABS Take 1 tablet by mouth daily.   Yes Historical Provider, MD  pantoprazole (PROTONIX) 40 MG  tablet Take 1 tablet (40 mg total) by mouth daily. Patient taking differently: Take 40 mg by mouth daily as needed (acid reflux).  08/09/15  Yes Kristen N Ward, DO   BP 117/67 mmHg  Pulse 65  Temp(Src) 97.9 F (36.6 C) (Oral)  Resp 18  SpO2 94% Physical Exam  Constitutional: She is oriented to person, place, and time. She appears well-developed and well-nourished. No distress.  HENT:  Head: Normocephalic and atraumatic.  Eyes: EOM are normal.  Neck: Normal range of motion.  Cardiovascular: Normal rate, regular rhythm and normal heart sounds.   Pulmonary/Chest: Effort normal and breath sounds normal.  Abdominal: Soft. She exhibits no distension. There is no tenderness.  Musculoskeletal: Normal range of motion.  Neurological: She is alert and oriented to person, place, and time.  Skin: Skin is warm and dry.  Psychiatric: She has a normal mood and affect. Judgment normal.  Nursing note and vitals  reviewed.   ED Course  Procedures (including critical care time) Labs Review Labs Reviewed  COMPREHENSIVE METABOLIC PANEL - Abnormal; Notable for the following:    Glucose, Bld 116 (*)    Total Protein 6.3 (*)    AST 184 (*)    ALT 180 (*)    All other components within normal limits  URINALYSIS, ROUTINE W REFLEX MICROSCOPIC (NOT AT [facility]) - Abnormal; Notable for the following:    Hgb urine dipstick TRACE (*)    Leukocytes, UA SMALL (*)    All other components within normal limits  URINE MICROSCOPIC-ADD ON - Abnormal; Notable for the following:    Squamous Epithelial / LPF 0-5 (*)    Bacteria, UA RARE (*)    All other components within normal limits  LIPASE, BLOOD  CBC  POC URINE PREG, ED   ALT  Date Value Ref Range Status  09/18/2015 180* 14 - 54 U/L Final  08/08/2015 164* 14 - 54 U/L Final  07/25/2014 214* 0 - 35 U/L Final  05/25/2013 83* 0 - 35 U/L Final    AST  Date Value Ref Range Status  09/18/2015 184* 15 - 41 U/L Final  08/08/2015 128* 15 - 41 U/L Final  07/25/2014 163* 0 - 37 U/L Final  05/25/2013 67* 0 - 37 U/L Final      Imaging Review Dg Chest 2 View  09/18/2015  CLINICAL DATA:  Acute onset of generalized chest pain and difficulty swallowing. Initial encounter. EXAM: CHEST  2 VIEW COMPARISON:  Chest radiograph performed 11/24/2013 FINDINGS: The lungs are well-aerated and clear. There is no evidence of focal opacification, pleural effusion or pneumothorax. The heart is normal in size; the mediastinal contour is within normal limits. No acute osseous abnormalities are seen. IMPRESSION: No acute cardiopulmonary process seen. Electronically Signed   By: Roanna Raider M.D.   On: 09/18/2015 06:44   I have personally reviewed and evaluated these images and lab results as part of my medical decision-making.   EKG Interpretation None      MDM   Final diagnoses:  None    Diarrhea seems viral in nature.  Patient is developing some dysphagia.  No  indication for emergent management tonight in the emergency department.  She can be discharged home safely on a clear liquid diet to follow-up with GI.  She's been instructed to take her Protonix.  She understands to return to the ER for new or worsening symptoms.  Abdominal exam is benign.  Patient feels better after GI cocktail.  Baseline elevation in her  LFTs.   Azalia Bilis, MD 09/18/15 [phone]

## 2015-09-23 DIAGNOSIS — J069 Acute upper respiratory infection, unspecified: Secondary | ICD-10-CM | POA: Diagnosis not present

## 2015-09-23 DIAGNOSIS — B1920 Unspecified viral hepatitis C without hepatic coma: Secondary | ICD-10-CM | POA: Diagnosis not present

## 2015-09-23 DIAGNOSIS — E119 Type 2 diabetes mellitus without complications: Secondary | ICD-10-CM | POA: Diagnosis not present

## 2015-09-23 DIAGNOSIS — Z780 Asymptomatic menopausal state: Secondary | ICD-10-CM | POA: Diagnosis not present

## 2015-09-23 DIAGNOSIS — R0981 Nasal congestion: Secondary | ICD-10-CM | POA: Diagnosis not present

## 2015-09-23 DIAGNOSIS — H9203 Otalgia, bilateral: Secondary | ICD-10-CM | POA: Diagnosis not present

## 2015-09-23 DIAGNOSIS — R05 Cough: Secondary | ICD-10-CM | POA: Diagnosis not present

## 2015-09-23 DIAGNOSIS — R5383 Other fatigue: Secondary | ICD-10-CM | POA: Diagnosis not present

## 2015-10-20 DIAGNOSIS — J0110 Acute frontal sinusitis, unspecified: Secondary | ICD-10-CM | POA: Diagnosis not present

## 2015-10-20 DIAGNOSIS — R6883 Chills (without fever): Secondary | ICD-10-CM | POA: Diagnosis not present

## 2015-10-20 DIAGNOSIS — E119 Type 2 diabetes mellitus without complications: Secondary | ICD-10-CM | POA: Diagnosis not present

## 2015-10-20 DIAGNOSIS — Z780 Asymptomatic menopausal state: Secondary | ICD-10-CM | POA: Diagnosis not present

## 2015-10-20 DIAGNOSIS — B1920 Unspecified viral hepatitis C without hepatic coma: Secondary | ICD-10-CM | POA: Diagnosis not present

## 2015-10-20 DIAGNOSIS — Z880 Allergy status to penicillin: Secondary | ICD-10-CM | POA: Diagnosis not present

## 2015-10-20 DIAGNOSIS — R0981 Nasal congestion: Secondary | ICD-10-CM | POA: Diagnosis not present

## 2015-10-20 DIAGNOSIS — R51 Headache: Secondary | ICD-10-CM | POA: Diagnosis not present

## 2015-10-21 DIAGNOSIS — J069 Acute upper respiratory infection, unspecified: Secondary | ICD-10-CM | POA: Diagnosis not present

## 2015-10-21 DIAGNOSIS — E669 Obesity, unspecified: Secondary | ICD-10-CM | POA: Diagnosis not present

## 2015-10-21 DIAGNOSIS — T7840XA Allergy, unspecified, initial encounter: Secondary | ICD-10-CM | POA: Diagnosis not present

## 2015-10-21 DIAGNOSIS — E119 Type 2 diabetes mellitus without complications: Secondary | ICD-10-CM | POA: Diagnosis not present

## 2015-10-21 DIAGNOSIS — R51 Headache: Secondary | ICD-10-CM | POA: Diagnosis not present

## 2015-10-23 DIAGNOSIS — M797 Fibromyalgia: Secondary | ICD-10-CM | POA: Diagnosis not present

## 2015-10-23 DIAGNOSIS — E119 Type 2 diabetes mellitus without complications: Secondary | ICD-10-CM | POA: Diagnosis not present

## 2015-10-23 DIAGNOSIS — B1920 Unspecified viral hepatitis C without hepatic coma: Secondary | ICD-10-CM | POA: Diagnosis not present

## 2015-10-23 DIAGNOSIS — J329 Chronic sinusitis, unspecified: Secondary | ICD-10-CM | POA: Diagnosis not present

## 2015-10-23 DIAGNOSIS — R51 Headache: Secondary | ICD-10-CM | POA: Diagnosis not present

## 2015-11-01 ENCOUNTER — Encounter: Payer: Self-pay | Admitting: Gastroenterology

## 2015-11-11 NOTE — Progress Notes (Signed)
This encounter was created in error - please disregard.

## 2015-12-03 ENCOUNTER — Ambulatory Visit (HOSPITAL_COMMUNITY): Admit: 2015-12-03 | Payer: Self-pay | Admitting: Gastroenterology

## 2015-12-03 ENCOUNTER — Encounter (HOSPITAL_COMMUNITY): Payer: Self-pay

## 2015-12-03 SURGERY — COLONOSCOPY WITH PROPOFOL
Anesthesia: Monitor Anesthesia Care

## 2016-01-07 DIAGNOSIS — B1920 Unspecified viral hepatitis C without hepatic coma: Secondary | ICD-10-CM | POA: Diagnosis not present

## 2016-01-07 DIAGNOSIS — R945 Abnormal results of liver function studies: Secondary | ICD-10-CM | POA: Diagnosis not present

## 2016-01-07 DIAGNOSIS — M797 Fibromyalgia: Secondary | ICD-10-CM | POA: Diagnosis not present

## 2016-01-07 DIAGNOSIS — R21 Rash and other nonspecific skin eruption: Secondary | ICD-10-CM | POA: Diagnosis not present

## 2016-01-07 DIAGNOSIS — R748 Abnormal levels of other serum enzymes: Secondary | ICD-10-CM | POA: Diagnosis not present

## 2016-01-07 DIAGNOSIS — E119 Type 2 diabetes mellitus without complications: Secondary | ICD-10-CM | POA: Diagnosis not present

## 2016-01-07 DIAGNOSIS — L299 Pruritus, unspecified: Secondary | ICD-10-CM | POA: Diagnosis not present

## 2016-01-07 DIAGNOSIS — T148 Other injury of unspecified body region: Secondary | ICD-10-CM | POA: Diagnosis not present

## 2016-04-15 ENCOUNTER — Emergency Department (HOSPITAL_COMMUNITY)
Admission: EM | Admit: 2016-04-15 | Discharge: 2016-04-15 | Disposition: A | Discharge status: Home / Self Care | Payer: Medicare Other | Attending: Emergency Medicine | Admitting: Emergency Medicine

## 2016-04-15 ENCOUNTER — Encounter (HOSPITAL_COMMUNITY): Payer: Self-pay

## 2016-04-15 ENCOUNTER — Emergency Department (HOSPITAL_COMMUNITY): Payer: Medicare Other

## 2016-04-15 DIAGNOSIS — Y929 Unspecified place or not applicable: Secondary | ICD-10-CM | POA: Diagnosis not present

## 2016-04-15 DIAGNOSIS — Y999 Unspecified external cause status: Secondary | ICD-10-CM | POA: Diagnosis not present

## 2016-04-15 DIAGNOSIS — J45909 Unspecified asthma, uncomplicated: Secondary | ICD-10-CM | POA: Diagnosis not present

## 2016-04-15 DIAGNOSIS — M25562 Pain in left knee: Secondary | ICD-10-CM | POA: Diagnosis not present

## 2016-04-15 DIAGNOSIS — S3992XA Unspecified injury of lower back, initial encounter: Secondary | ICD-10-CM | POA: Diagnosis not present

## 2016-04-15 DIAGNOSIS — M545 Low back pain: Secondary | ICD-10-CM | POA: Diagnosis not present

## 2016-04-15 DIAGNOSIS — I10 Essential (primary) hypertension: Secondary | ICD-10-CM | POA: Insufficient documentation

## 2016-04-15 DIAGNOSIS — M546 Pain in thoracic spine: Secondary | ICD-10-CM | POA: Diagnosis not present

## 2016-04-15 DIAGNOSIS — M79662 Pain in left lower leg: Secondary | ICD-10-CM | POA: Diagnosis not present

## 2016-04-15 DIAGNOSIS — Y939 Activity, unspecified: Secondary | ICD-10-CM | POA: Insufficient documentation

## 2016-04-15 DIAGNOSIS — W010XXA Fall on same level from slipping, tripping and stumbling without subsequent striking against object, initial encounter: Secondary | ICD-10-CM | POA: Diagnosis not present

## 2016-04-15 DIAGNOSIS — W19XXXA Unspecified fall, initial encounter: Secondary | ICD-10-CM

## 2016-04-15 DIAGNOSIS — M5489 Other dorsalgia: Secondary | ICD-10-CM

## 2016-04-15 DIAGNOSIS — E119 Type 2 diabetes mellitus without complications: Secondary | ICD-10-CM | POA: Insufficient documentation

## 2016-04-15 DIAGNOSIS — M79641 Pain in right hand: Secondary | ICD-10-CM | POA: Insufficient documentation

## 2016-04-15 DIAGNOSIS — S299XXA Unspecified injury of thorax, initial encounter: Secondary | ICD-10-CM | POA: Diagnosis not present

## 2016-04-15 DIAGNOSIS — S8992XA Unspecified injury of left lower leg, initial encounter: Secondary | ICD-10-CM | POA: Diagnosis not present

## 2016-04-15 DIAGNOSIS — S6991XA Unspecified injury of right wrist, hand and finger(s), initial encounter: Secondary | ICD-10-CM | POA: Diagnosis not present

## 2016-04-15 MED ORDER — GLUCOSE BLOOD VI STRP: ORAL_STRIP | 12 refills | Status: DC

## 2016-04-15 MED ORDER — IBUPROFEN 200 MG PO TABS
600.0000 mg | ORAL_TABLET | Freq: Once | ORAL | Status: AC
  Administered 2016-04-15: 600 mg via ORAL
  Filled 2016-04-15: qty 3

## 2016-04-15 MED ORDER — IBUPROFEN 600 MG PO TABS: 600.0000 mg | ORAL_TABLET | Freq: Four times a day (QID) | ORAL | 0 refills | Status: DC | PRN

## 2016-04-15 MED ORDER — HYDROCODONE-ACETAMINOPHEN 5-325 MG PO TABS: 1.0000 | ORAL_TABLET | ORAL | 0 refills | Status: DC | PRN

## 2016-04-15 MED ORDER — HYDROCODONE-ACETAMINOPHEN 5-325 MG PO TABS
1.0000 | ORAL_TABLET | Freq: Once | ORAL | Status: AC
  Administered 2016-04-15: 1 via ORAL
  Filled 2016-04-15: qty 1

## 2016-04-15 NOTE — ED Notes (Signed)
EDP at bedside  

## 2016-04-15 NOTE — ED Notes (Signed)
Pt taken to xray 

## 2016-04-15 NOTE — ED Provider Notes (Signed)
[facility]-EMERGENCY DEPT Provider Note   CSN: [id number] Arrival date & time: 04/15/16  1718  First Provider Contact:  First MD Initiated Contact with Patient 04/15/16 2015        History   Chief Complaint Chief Complaint  Patient presents with  . Fall    HPI Susan Hogan is a 55 y.o. female.  Patient is a 55 year old female with past medical history of fibromyalgia, diabetes, degenerative disc disease, hypertension and reflux who presents the ED status post fall that occurred 3 days ago. Patient reports she slipped on water on the floor resulting in her falling on her left knee in trying to catch herself using her right hand. Denies head injury or LOC. Patient reports having associated pain to her left knee, right hand and mid/lower back. Patient reports pain is worse with movement. She states she has been taking Advil at home without relief. Pt denies fever, numbness, tingling, saddle anesthesia, loss of bowel or bladder, abdominal pain, urinary retention, weakness. Patient also reports she has had mild discomfort with swallowing larger pieces of food recently. She endorses history of esophageal dilation performed 2 years ago by Dr. Arlyce Dice. Patient notes she was scheduled to see him a few months ago but notes she was unable to make the appointment due to no longer having Medicaid coverage. Patient states she she has been able to tolerate liquids and small pieces of food without difficulty. Denies chest pain, difficulty breathing, choking, abdominal pain, vomiting.       Past Medical History:  Diagnosis Date  . Anemia   . Arthritis    RA "states does not see Rheumatologist"  . Asthma   . Carpal tunnel syndrome    bilaterally  . Complication of anesthesia    difficulty breathing  . Degenerative disk disease    back  . Depression   . Diabetes mellitus   . Fibromyalgia   . GERD (gastroesophageal reflux disease)   . Headache(784.0)    migraines  . Heart murmur   . Hepatitis    In followup treatment for hepatitis C  . Hypertension    no medication for at this time, sees Dr. Estevan Oaks Pcp  . Hypotensive episode    hx of  . Irritable bowel syndrome   . Kidney stone    with hematuria  . Kidney stones    with hematuria  . Recurrent upper respiratory infection (URI)   . Shortness of breath   . Syncope     Patient Active Problem List   Diagnosis Date Noted  . Syncope 10/05/2014  . Diabetes mellitus (HCC) 07/25/2014  . Fibromyalgia 07/25/2014  . MDD (major depressive disorder), recurrent severe, without psychosis (HCC) 03/17/2013  . HEPATITIS C 08/02/2007  . IRRITABLE BOWEL SYNDROME 08/02/2007  . DYSPNEA ON EXERTION 08/02/2007    Past Surgical History:  Procedure Laterality Date  . DILATION AND CURETTAGE OF UTERUS    . LIVER BIOPSY    . TONSILLECTOMY    . TUBAL LIGATION      OB History    No data available       Home Medications    Prior to Admission medications   Medication Sig Start Date End Date Taking? Authorizing Provider  Cyanocobalamin (VITAMIN B-12 PO) Take 1 tablet by mouth daily.    Historical Provider, MD  ferrous sulfate 325 (65 FE) MG tablet Take 325 mg by mouth daily with breakfast.    Historical Provider, MD  glucose blood (ACCU-CHEK ACTIVE STRIPS) test strip  Use as instructed 04/15/16   Barrett Henle, PA-C  HYDROcodone-acetaminophen (NORCO/VICODIN) 5-325 MG tablet Take 1 tablet by mouth every 4 (four) hours as needed. 04/15/16   Barrett Henle, PA-C  ibuprofen (ADVIL,MOTRIN) 600 MG tablet Take 1 tablet (600 mg total) by mouth every 6 (six) hours as needed. 04/15/16   Barrett Henle, PA-C  Multiple Vitamins-Minerals (HAIR/SKIN/NAILS) TABS Take 1 tablet by mouth daily.    Historical Provider, MD  pantoprazole (PROTONIX) 40 MG tablet Take 1 tablet (40 mg total) by mouth daily. 09/18/15   Azalia Bilis, MD    Family History Family History  Problem Relation Age of Onset  . Heart disease Mother   .  Diabetes Mother   . Asthma Mother   . Anemia Mother   . Colon cancer Maternal Uncle   . Heart disease Maternal Uncle   . Heart disease Maternal Grandmother   . Diabetes Maternal Grandmother   . Asthma Maternal Grandmother     Social History Social History  Substance Use Topics  . Smoking status: Never Smoker  . Smokeless tobacco: Never Used  . Alcohol use No     Allergies   Anesthetic ether [ether]; Nubain [nalbuphine hcl]; Penicillins; Advair diskus [fluticasone-salmeterol]; and Lactose intolerance (gi)   Review of Systems Review of Systems  HENT: Positive for trouble swallowing.   Musculoskeletal: Positive for arthralgias (left knee, right hand), back pain (mid/lower) and joint swelling.  All other systems reviewed and are negative.    Physical Exam Updated Vital Signs BP 122/66 (BP Location: Right Arm)   Pulse 98   Temp 98.1 F (36.7 C) (Oral)   Resp 14   SpO2 98%   Physical Exam  Constitutional: She is oriented to person, place, and time. She appears well-developed and well-nourished.  HENT:  Head: Normocephalic. Head is without raccoon's eyes, without Battle's sign, without abrasion and without laceration.  Right Ear: Tympanic membrane normal. No hemotympanum.  Left Ear: Tympanic membrane normal. No hemotympanum.  Nose: Nose normal. No sinus tenderness, nasal deformity, septal deviation or nasal septal hematoma. No epistaxis.  Mouth/Throat: Uvula is midline, oropharynx is clear and moist and mucous membranes are normal. No oropharyngeal exudate, posterior oropharyngeal edema, posterior oropharyngeal erythema or tonsillar abscesses.  Eyes: Conjunctivae and EOM are normal. Pupils are equal, round, and reactive to light. Right eye exhibits no discharge. Left eye exhibits no discharge. No scleral icterus.  Neck: Normal range of motion. Neck supple.  Cardiovascular: Normal rate, regular rhythm, normal heart sounds and intact distal pulses.   Pulmonary/Chest: Effort  normal and breath sounds normal. No respiratory distress. She has no wheezes. She has no rales. She exhibits no tenderness.  Abdominal: Soft. Bowel sounds are normal. She exhibits no distension and no mass. There is no tenderness. There is no rebound and no guarding. No hernia.  Musculoskeletal: Normal range of motion. She exhibits tenderness. She exhibits no edema.       Left knee: She exhibits swelling. She exhibits normal range of motion, no effusion, no ecchymosis, no deformity, no laceration, no erythema, normal alignment, no LCL laxity, normal patellar mobility, no bony tenderness, normal meniscus and no MCL laxity. Tenderness (posterior, anterior and medial) found. Medial joint line tenderness noted.       Right hand: She exhibits tenderness and swelling. She exhibits normal range of motion, normal two-point discrimination, normal capillary refill, no deformity and no laceration. Normal sensation noted. Normal strength noted.       Hands:  Legs: No cervical spine midline TTP. TTP over mid thoracic and lumbar midline spine. Left upper thoracic paraspinal muscles TTP. Full ROM of bilateral upper and lower extremities with 5/5 strength. Equal grip strength bilaterally. 2+ radial and PT pulses. Sensation grossly intact.  Patient able to stand and ambulate without assistance but endorses pain to her left knee.  Neurological: She is alert and oriented to person, place, and time. She has normal strength. No cranial nerve deficit or sensory deficit. Coordination and gait normal.  Skin: Skin is warm and dry.  Nursing note and vitals reviewed.    ED Treatments / Results  Labs (all labs ordered are listed, but only abnormal results are displayed) Labs Reviewed - No data to display  EKG  EKG Interpretation None       Radiology Dg Thoracic Spine 2 View  Result Date: 04/15/2016 CLINICAL DATA:  55 year old female with fall and back pain. EXAM: THORACIC SPINE 2 VIEWS ; LUMBAR SPINE -  COMPLETE 4+ VIEW COMPARISON:  Chest radiograph dated 09/18/2015 FINDINGS: There is no acute fracture or subluxation of the Set thoracic or lumbar spine. The vertebral body heights are maintained. There is mild multilevel degenerative changes throughout the spine. The visualized posterior elements appear intact. The soft tissues appear unremarkable. IMPRESSION: No acute/traumatic thoracic or lumbar spine pathology. Electronically Signed   By: Elgie Collard M.D.   On: 04/15/2016 22:03  Dg Lumbar Spine Complete  Result Date: 04/15/2016 CLINICAL DATA:  55 year old female with fall and back pain. EXAM: THORACIC SPINE 2 VIEWS ; LUMBAR SPINE - COMPLETE 4+ VIEW COMPARISON:  Chest radiograph dated 09/18/2015 FINDINGS: There is no acute fracture or subluxation of the Set thoracic or lumbar spine. The vertebral body heights are maintained. There is mild multilevel degenerative changes throughout the spine. The visualized posterior elements appear intact. The soft tissues appear unremarkable. IMPRESSION: No acute/traumatic thoracic or lumbar spine pathology. Electronically Signed   By: Elgie Collard M.D.   On: 04/15/2016 22:03  Dg Tibia/fibula Left  Result Date: 04/15/2016 CLINICAL DATA:  55 year old female with fall and left lower extremity pain. EXAM: LEFT TIBIA AND FIBULA - 2 VIEW COMPARISON:  None. FINDINGS: There is no acute fracture or dislocation. The bones are well mineralized. Mild osteoarthritic changes of the knee. The soft tissues appear unremarkable. There are no radiopaque foreign object. IMPRESSION: Negative. Electronically Signed   By: Elgie Collard M.D.   On: 04/15/2016 22:00  Dg Knee Complete 4 Views Left  Result Date: 04/15/2016 CLINICAL DATA:  Slipped and fell on hard tile floor of restaurant 2 days ago. Initial encounter. EXAM: LEFT KNEE - COMPLETE 4+ VIEW COMPARISON:  MRI of the left knee 09/25/2011. FINDINGS: Degenerative changes are again noted along the medial collateral ligament.  The knee is located. No acute bone or soft tissue abnormality is present. There is no significant joint effusion. IMPRESSION: 1. No acute abnormality. 2. Stable mild degenerative change. Electronically Signed   By: Marin Roberts M.D.   On: 04/15/2016 22:00  Dg Hand Complete Right  Result Date: 04/15/2016 CLINICAL DATA:  Fall 2 days ago.  Right hand pain. EXAM: RIGHT HAND - COMPLETE 3+ VIEW COMPARISON:  None. FINDINGS: There is no evidence of fracture or dislocation. There is no evidence of arthropathy or other focal bone abnormality. Soft tissues are unremarkable. IMPRESSION: Negative. Electronically Signed   By: Marin Roberts M.D.   On: 04/15/2016 22:01   Procedures Procedures (including critical care time)  Medications Ordered in ED Medications  ibuprofen (ADVIL,MOTRIN) tablet 600 mg (600 mg Oral Given 04/15/16 2058)  HYDROcodone-acetaminophen (NORCO/VICODIN) 5-325 MG per tablet 1 tablet (1 tablet Oral Given 04/15/16 2232)     Initial Impression / Assessment and Plan / ED Course  I have reviewed the triage vital signs and the nursing notes.  Pertinent labs & imaging results that were available during my care of the patient were reviewed by me and considered in my medical decision making (see chart for details).  Clinical Course    Patient presents after slipping on a wet floor 2 days ago with associated left knee pain, right hand pain and back pain. Denies head injury or LOC. Denies use of anticoagulants. VSS. Exam revealed tenderness and mild swelling to left anterior knee and right dorsal palm. Midline upper thoracic and lumbar spine tender palpation, no back pain red flags, no neuro deficits. No evidence of head injury. Patient given pain meds in the ED. X-rays of left knee, right hand, thoracic and lumbar spine negative. Plan to discharge patient home with pain meds, NSAIDs and symptomatic treatment. Advised patient to follow up with PCP as needed.  Patient also is  complaining of difficulty swallowing large pieces of food. Endorses history of esophageal dilation 2 years ago. She reports she is able to tolerate fluids, medications and small bites of food without any complete locations. Patient denies fever, vomiting, difficulty breathing, abdominal pain, hematemesis. Patient states she was scheduled to follow up with her GI provider last month but missed her appointment. Exam unremarkable. Patient is hemodynamically stable in the ED. Patient able tolerate by mouth in the ED. I do not feel that the patient warrants emergent imaging or further evaluation at this time and feel pt is appropriate to be d/c home with outpatient GI follow up. Discussed return precautions with patient.  Final Clinical Impressions(s) / ED Diagnoses   Final diagnoses:  Fall, initial encounter  Left knee pain  Midline back pain, unspecified location  Right hand pain    New Prescriptions Discharge Medication List as of 04/15/2016 10:22 PM    START taking these medications   Details  HYDROcodone-acetaminophen (NORCO/VICODIN) 5-325 MG tablet Take 1 tablet by mouth every 4 (four) hours as needed., Starting Wed 04/15/2016, Print    ibuprofen (ADVIL,MOTRIN) 600 MG tablet Take 1 tablet (600 mg total) by mouth every 6 (six) hours as needed., Starting Wed 04/15/2016, Print         Satira Sark [place], [place] 04/15/16 2313    Zadie Rhine, MD 04/16/16 1404

## 2016-04-15 NOTE — ED Notes (Signed)
Patient transported to X-ray 

## 2016-04-15 NOTE — ED Triage Notes (Signed)
Patient here with multiple complaints. Fell on Monday and now complaining of knee pain, back pain, shoulder discomfort. Also wants her esophagus checked because food sometimes gets stuck. Also concerned that she thinks her BP is up and down. NAD

## 2016-04-15 NOTE — Discharge Instructions (Signed)
Take your medications as prescribed as an for pain relief. I also recommend applying ice to affected area for 15-20 minutes 3-4 times daily for pain relief. Please follow up with a primary care provider from the Resource Guide provided below in 1 week if your symptoms have not improved. Please return to the Emergency Department if symptoms worsen or new onset of fever, headache, lightheadedness, dizziness, chest pain, difficulty breathing, redness, swelling, numbness, tingling, weakness.

## 2016-04-15 NOTE — ED Provider Notes (Deleted)
ED ECG REPORT   Date: 04/15/2016  Rate: 84  Rhythm: normal sinus rhythm  QRS Axis: normal  Intervals: normal  ST/T Wave abnormalities: normal  Conduction Disutrbances:none   I have personally reviewed the EKG tracing and agree with the computerized printout as noted.    Zadie Rhine, MD 04/15/16 2144

## 2016-05-07 DIAGNOSIS — S8992XA Unspecified injury of left lower leg, initial encounter: Secondary | ICD-10-CM | POA: Diagnosis not present

## 2016-05-07 DIAGNOSIS — M25562 Pain in left knee: Secondary | ICD-10-CM | POA: Diagnosis not present

## 2016-05-07 DIAGNOSIS — M238X2 Other internal derangements of left knee: Secondary | ICD-10-CM | POA: Diagnosis not present

## 2016-05-07 DIAGNOSIS — M1712 Unilateral primary osteoarthritis, left knee: Secondary | ICD-10-CM | POA: Diagnosis not present

## 2016-05-07 DIAGNOSIS — M2242 Chondromalacia patellae, left knee: Secondary | ICD-10-CM | POA: Diagnosis not present

## 2016-05-21 DIAGNOSIS — M1712 Unilateral primary osteoarthritis, left knee: Secondary | ICD-10-CM | POA: Diagnosis not present

## 2016-06-02 DIAGNOSIS — M1712 Unilateral primary osteoarthritis, left knee: Secondary | ICD-10-CM | POA: Diagnosis not present

## 2016-06-16 DIAGNOSIS — S83242A Other tear of medial meniscus, current injury, left knee, initial encounter: Secondary | ICD-10-CM | POA: Diagnosis not present

## 2016-06-16 DIAGNOSIS — M1712 Unilateral primary osteoarthritis, left knee: Secondary | ICD-10-CM | POA: Diagnosis not present

## 2016-07-27 DIAGNOSIS — M25562 Pain in left knee: Secondary | ICD-10-CM | POA: Diagnosis not present

## 2016-07-27 DIAGNOSIS — M549 Dorsalgia, unspecified: Secondary | ICD-10-CM | POA: Diagnosis not present

## 2016-07-27 DIAGNOSIS — E1165 Type 2 diabetes mellitus with hyperglycemia: Secondary | ICD-10-CM | POA: Diagnosis not present

## 2016-07-27 DIAGNOSIS — G8929 Other chronic pain: Secondary | ICD-10-CM | POA: Diagnosis not present

## 2016-08-02 ENCOUNTER — Emergency Department (HOSPITAL_COMMUNITY): Payer: Medicare Other

## 2016-08-02 ENCOUNTER — Emergency Department (HOSPITAL_COMMUNITY)
Admission: EM | Admit: 2016-08-02 | Discharge: 2016-08-02 | Disposition: A | Discharge status: Home / Self Care | Payer: Medicare Other | Attending: Emergency Medicine | Admitting: Emergency Medicine

## 2016-08-02 ENCOUNTER — Encounter (HOSPITAL_COMMUNITY): Payer: Self-pay

## 2016-08-02 DIAGNOSIS — I10 Essential (primary) hypertension: Secondary | ICD-10-CM | POA: Insufficient documentation

## 2016-08-02 DIAGNOSIS — M549 Dorsalgia, unspecified: Secondary | ICD-10-CM

## 2016-08-02 DIAGNOSIS — E119 Type 2 diabetes mellitus without complications: Secondary | ICD-10-CM | POA: Insufficient documentation

## 2016-08-02 DIAGNOSIS — R0789 Other chest pain: Secondary | ICD-10-CM | POA: Diagnosis not present

## 2016-08-02 DIAGNOSIS — J45909 Unspecified asthma, uncomplicated: Secondary | ICD-10-CM | POA: Diagnosis not present

## 2016-08-02 DIAGNOSIS — M546 Pain in thoracic spine: Secondary | ICD-10-CM | POA: Diagnosis not present

## 2016-08-02 DIAGNOSIS — R079 Chest pain, unspecified: Secondary | ICD-10-CM | POA: Diagnosis present

## 2016-08-02 LAB — URINE MICROSCOPIC-ADD ON

## 2016-08-02 LAB — CBC WITH DIFFERENTIAL/PLATELET
BASOS PCT: 0 %
Basophils Absolute: 0 10*3/uL (ref 0.0–0.1)
EOS ABS: 0.1 10*3/uL (ref 0.0–0.7)
Eosinophils Relative: 1 %
HEMATOCRIT: 46.6 % — AB (ref 36.0–46.0)
Hemoglobin: 15.8 g/dL — ABNORMAL HIGH (ref 12.0–15.0)
Lymphocytes Relative: 29 %
Lymphs Abs: 2.5 10*3/uL (ref 0.7–4.0)
MCH: 33.3 pg (ref 26.0–34.0)
MCHC: 33.9 g/dL (ref 30.0–36.0)
MCV: 98.3 fL (ref 78.0–100.0)
MONO ABS: 0.5 10*3/uL (ref 0.1–1.0)
MONOS PCT: 6 %
Neutro Abs: 5.6 10*3/uL (ref 1.7–7.7)
Neutrophils Relative %: 64 %
Platelets: 192 10*3/uL (ref 150–400)
RBC: 4.74 MIL/uL (ref 3.87–5.11)
RDW: 12.7 % (ref 11.5–15.5)
WBC: 8.7 10*3/uL (ref 4.0–10.5)

## 2016-08-02 LAB — I-STAT CHEM 8, ED
BUN: 12 mg/dL (ref 6–20)
CREATININE: 0.6 mg/dL (ref 0.44–1.00)
Calcium, Ion: 1.13 mmol/L — ABNORMAL LOW (ref 1.15–1.40)
Chloride: 104 mmol/L (ref 101–111)
Glucose, Bld: 108 mg/dL — ABNORMAL HIGH (ref 65–99)
HEMATOCRIT: 46 % (ref 36.0–46.0)
HEMOGLOBIN: 15.6 g/dL — AB (ref 12.0–15.0)
Potassium: 3.6 mmol/L (ref 3.5–5.1)
SODIUM: 143 mmol/L (ref 135–145)
TCO2: 25 mmol/L (ref 0–100)

## 2016-08-02 LAB — URINALYSIS, ROUTINE W REFLEX MICROSCOPIC
BILIRUBIN URINE: NEGATIVE
GLUCOSE, UA: NEGATIVE mg/dL
KETONES UR: NEGATIVE mg/dL
Nitrite: NEGATIVE
PROTEIN: NEGATIVE mg/dL
Specific Gravity, Urine: 1.02 (ref 1.005–1.030)
pH: 5.5 (ref 5.0–8.0)

## 2016-08-02 LAB — I-STAT TROPONIN, ED: TROPONIN I, POC: 0 ng/mL (ref 0.00–0.08)

## 2016-08-02 LAB — CBG MONITORING, ED: GLUCOSE-CAPILLARY: 107 mg/dL — AB (ref 65–99)

## 2016-08-02 MED ORDER — IBUPROFEN 400 MG PO TABS: 800.0000 mg | ORAL_TABLET | Freq: Once | ORAL | Status: DC

## 2016-08-02 MED ORDER — IBUPROFEN 400 MG PO TABS
800.0000 mg | ORAL_TABLET | Freq: Once | ORAL | Status: AC
  Administered 2016-08-02: 800 mg via ORAL
  Filled 2016-08-02: qty 2

## 2016-08-02 MED ORDER — ACETAMINOPHEN 500 MG PO TABS
1000.0000 mg | ORAL_TABLET | Freq: Once | ORAL | Status: AC
  Administered 2016-08-02: 1000 mg via ORAL
  Filled 2016-08-02: qty 2
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
## 2016-08-02 NOTE — ED Notes (Signed)
Pt now reports intermittent CP that she did not report in triage or on initial assessment. MD placed new orders.

## 2016-08-02 NOTE — ED Notes (Signed)
EDP at bedside  

## 2016-08-02 NOTE — ED Notes (Signed)
On pain assessment, pt reports she has type 2 DM and has not checked her blood sugar in about a week due to "running out of strips", pt also reports she has been off her metformin for a week and states that might be why "she feels so tired". CBG 107. Pt also became tearful and expressed anxiety at work causing her more stress than normal.

## 2016-08-02 NOTE — Discharge Instructions (Signed)
Take 4 over the counter ibuprofen tablets 3 times a day or 2 over-the-counter naproxen tablets twice a day for pain. Also take tylenol 1000mg(2 extra strength) four times a day.    

## 2016-08-11 ENCOUNTER — Encounter (HOSPITAL_COMMUNITY): Payer: Self-pay | Admitting: *Deleted

## 2016-08-11 ENCOUNTER — Emergency Department (HOSPITAL_COMMUNITY)
Admission: EM | Admit: 2016-08-11 | Discharge: 2016-08-11 | Disposition: A | Discharge status: Home / Self Care | Payer: Medicare Other | Attending: Emergency Medicine | Admitting: Emergency Medicine

## 2016-08-11 DIAGNOSIS — B349 Viral infection, unspecified: Secondary | ICD-10-CM | POA: Diagnosis not present

## 2016-08-11 DIAGNOSIS — R1110 Vomiting, unspecified: Secondary | ICD-10-CM | POA: Diagnosis present

## 2016-08-11 DIAGNOSIS — I10 Essential (primary) hypertension: Secondary | ICD-10-CM | POA: Diagnosis not present

## 2016-08-11 DIAGNOSIS — J45909 Unspecified asthma, uncomplicated: Secondary | ICD-10-CM | POA: Insufficient documentation

## 2016-08-11 DIAGNOSIS — E119 Type 2 diabetes mellitus without complications: Secondary | ICD-10-CM | POA: Diagnosis not present

## 2016-08-11 LAB — COMPREHENSIVE METABOLIC PANEL
ALBUMIN: 3.6 g/dL (ref 3.5–5.0)
ALK PHOS: 65 U/L (ref 38–126)
ALT: 139 U/L — AB (ref 14–54)
AST: 113 U/L — AB (ref 15–41)
Anion gap: 8 (ref 5–15)
BUN: 11 mg/dL (ref 6–20)
CALCIUM: 8.7 mg/dL — AB (ref 8.9–10.3)
CO2: 26 mmol/L (ref 22–32)
CREATININE: 0.57 mg/dL (ref 0.44–1.00)
Chloride: 103 mmol/L (ref 101–111)
GFR calc non Af Amer: >60 mL/min (ref >60)
GLUCOSE: 111 mg/dL — AB (ref 65–99)
Potassium: 3.7 mmol/L (ref 3.5–5.1)
SODIUM: 137 mmol/L (ref 135–145)
Total Bilirubin: 0.5 mg/dL (ref 0.3–1.2)
Total Protein: 6.2 g/dL — ABNORMAL LOW (ref 6.5–8.1)

## 2016-08-11 LAB — URINALYSIS, ROUTINE W REFLEX MICROSCOPIC
BILIRUBIN URINE: NEGATIVE
GLUCOSE, UA: NEGATIVE mg/dL
Ketones, ur: NEGATIVE mg/dL
Leukocytes, UA: NEGATIVE
Nitrite: NEGATIVE
PROTEIN: NEGATIVE mg/dL
Specific Gravity, Urine: 1.012 (ref 1.005–1.030)
pH: 5.5 (ref 5.0–8.0)

## 2016-08-11 LAB — URINE MICROSCOPIC-ADD ON
Bacteria, UA: NONE SEEN
WBC, UA: NONE SEEN WBC/hpf (ref 0–5)

## 2016-08-11 LAB — CBC
HCT: 44.8 % (ref 36.0–46.0)
Hemoglobin: 15.6 g/dL — ABNORMAL HIGH (ref 12.0–15.0)
MCH: 34.4 pg — AB (ref 26.0–34.0)
MCHC: 34.8 g/dL (ref 30.0–36.0)
MCV: 98.7 fL (ref 78.0–100.0)
PLATELETS: 168 10*3/uL (ref 150–400)
RBC: 4.54 MIL/uL (ref 3.87–5.11)
RDW: 12.6 % (ref 11.5–15.5)
WBC: 9.7 10*3/uL (ref 4.0–10.5)

## 2016-08-11 LAB — LIPASE, BLOOD: Lipase: 33 U/L (ref 11–51)

## 2016-08-11 MED ORDER — ACETAMINOPHEN 325 MG PO TABS
650.0000 mg | ORAL_TABLET | Freq: Once | ORAL | Status: AC
  Administered 2016-08-11: 650 mg via ORAL
  Filled 2016-08-11: qty 2

## 2016-08-11 MED ORDER — ONDANSETRON 4 MG PO TBDP
4.0000 mg | ORAL_TABLET | Freq: Once | ORAL | Status: AC
  Administered 2016-08-11: 4 mg via ORAL
  Filled 2016-08-11: qty 1

## 2016-08-11 MED ORDER — ONDANSETRON HCL 4 MG PO TABS: 4.0000 mg | ORAL_TABLET | Freq: Three times a day (TID) | ORAL | 0 refills | Status: DC | PRN

## 2016-08-11 NOTE — ED Provider Notes (Signed)
[facility] DEPT Provider Note   CSN: [id number] Arrival date & time: 08/11/16  1951     History   Chief Complaint Chief Complaint  Patient presents with  . Generalized Body Aches     HPI   Blood pressure 124/64, pulse 73, temperature 99 F (37.2 C), temperature source Oral, resp. rate 18, height 5' (1.524 m), weight 94.3 kg, SpO2 100 %.  Susan Hogan is a 55 y.o. female complaining of Nonbloody, nonbilious, non-coffee ground emesis 2 times a day with associated loose, non-melanotic, non-tarry non-hematochezia stool onset this morning. She states that she has muscle aches all over her body and tactile fever and chills. Last ibuprofen was at 2 PM today. She denies focal chest pain, abdominal pain, change in urination. States pain is 10 out of 10, no pain medication taken prior to arrival. No known sick contacts. Patient does not have a primary care physician and states that she is displeased with her primary care physician due to lack of aggressive pain control. Patient denies rhinorrhea, headache, cervicalgia, rash, otalgia.   Past Medical History:  Diagnosis Date  . Anemia   . Arthritis    RA "states does not see Rheumatologist"  . Asthma   . Carpal tunnel syndrome    bilaterally  . Complication of anesthesia    difficulty breathing  . Degenerative disk disease    back  . Depression   . Diabetes mellitus   . Fibromyalgia   . GERD (gastroesophageal reflux disease)   . Headache(784.0)    migraines  . Heart murmur   . Hepatitis    In followup treatment for hepatitis C  . Hypertension    no medication for at this time, sees Dr. Estevan Oaks Pcp  . Hypotensive episode    hx of  . Irritable bowel syndrome   . Kidney stone    with hematuria  . Kidney stones    with hematuria  . Recurrent upper respiratory infection (URI)   . Shortness of breath   . Syncope     Patient Active Problem List   Diagnosis Date Noted  . Syncope 10/05/2014  . Diabetes  mellitus (HCC) 07/25/2014  . Fibromyalgia 07/25/2014  . MDD (major depressive disorder), recurrent severe, without psychosis (HCC) 03/17/2013  . HEPATITIS C 08/02/2007  . IRRITABLE BOWEL SYNDROME 08/02/2007  . DYSPNEA ON EXERTION 08/02/2007    Past Surgical History:  Procedure Laterality Date  . DILATION AND CURETTAGE OF UTERUS    . LIVER BIOPSY    . TONSILLECTOMY    . TUBAL LIGATION      OB History    No data available       Home Medications    Prior to Admission medications   Medication Sig Start Date End Date Taking? Authorizing Provider  Cyanocobalamin (VITAMIN B-12 PO) Take 1 tablet by mouth daily.    Historical Provider, MD  ferrous sulfate 325 (65 FE) MG tablet Take 325 mg by mouth daily with breakfast.    Historical Provider, MD  glucose blood (ACCU-CHEK ACTIVE STRIPS) test strip Use as instructed 04/15/16   Barrett Henle, PA-C  HYDROcodone-acetaminophen (NORCO/VICODIN) 5-325 MG tablet Take 1 tablet by mouth every 4 (four) hours as needed. 04/15/16   Barrett Henle, PA-C  ibuprofen (ADVIL,MOTRIN) 600 MG tablet Take 1 tablet (600 mg total) by mouth every 6 (six) hours as needed. 04/15/16   Barrett Henle, PA-C  Multiple Vitamins-Minerals (HAIR/SKIN/NAILS) TABS Take 1 tablet by mouth daily.  Historical Provider, MD  ondansetron (ZOFRAN) 4 MG tablet Take 1 tablet (4 mg total) by mouth every 8 (eight) hours as needed for nausea or vomiting. 08/11/16   Fortune Brannigan, PA-C  pantoprazole (PROTONIX) 40 MG tablet Take 1 tablet (40 mg total) by mouth daily. 09/18/15   Azalia Bilis, MD    Family History Family History  Problem Relation Age of Onset  . Heart disease Mother   . Diabetes Mother   . Asthma Mother   . Anemia Mother   . Colon cancer Maternal Uncle   . Heart disease Maternal Uncle   . Heart disease Maternal Grandmother   . Diabetes Maternal Grandmother   . Asthma Maternal Grandmother     Social History Social History  Substance  Use Topics  . Smoking status: Never Smoker  . Smokeless tobacco: Never Used  . Alcohol use No     Allergies   Anesthetic ether [ether]; Nubain [nalbuphine hcl]; Penicillins; Advair diskus [fluticasone-salmeterol]; and Lactose intolerance (gi)   Review of Systems Review of Systems  10 systems reviewed and found to be negative, except as noted in the HPI.   Physical Exam Updated Vital Signs BP 130/58 (BP Location: Right Arm)   Pulse 80   Temp 99.6 F (37.6 C) (Oral)   Resp 16   Ht 5' (1.524 m)   Wt 94.3 kg   SpO2 100%   BMI 40.62 kg/m   Physical Exam  Constitutional: She is oriented to person, place, and time. She appears well-developed and well-nourished. No distress.  HENT:  Head: Normocephalic and atraumatic.  Mouth/Throat: Oropharynx is clear and moist.  Eyes: Conjunctivae and EOM are normal. Pupils are equal, round, and reactive to light.  Neck: Normal range of motion.  Cardiovascular: Normal rate, regular rhythm and intact distal pulses.   Pulmonary/Chest: Effort normal and breath sounds normal. No respiratory distress. She has no wheezes. She has no rales. She exhibits no tenderness.  Abdominal: Soft. She exhibits no distension and no mass. There is no tenderness. There is no rebound and no guarding. No hernia.  Normoactive bowel sounds, no tenderness to palpation of any quadrant. No guarding or rebound.  Musculoskeletal: Normal range of motion.  Neurological: She is alert and oriented to person, place, and time.  Skin: She is not diaphoretic.  Psychiatric: She has a normal mood and affect.  Nursing note and vitals reviewed.    ED Treatments / Results  Labs (all labs ordered are listed, but only abnormal results are displayed) Labs Reviewed  COMPREHENSIVE METABOLIC PANEL - Abnormal; Notable for the following:       Result Value   Glucose, Bld 111 (*)    Calcium 8.7 (*)    Total Protein 6.2 (*)    AST 113 (*)    ALT 139 (*)    All other components  within normal limits  CBC - Abnormal; Notable for the following:    Hemoglobin 15.6 (*)    MCH 34.4 (*)    All other components within normal limits  URINALYSIS, ROUTINE W REFLEX MICROSCOPIC (NOT AT [facility]) - Abnormal; Notable for the following:    Hgb urine dipstick TRACE (*)    All other components within normal limits  URINE MICROSCOPIC-ADD ON - Abnormal; Notable for the following:    Squamous Epithelial / LPF 0-5 (*)    All other components within normal limits  LIPASE, BLOOD    EKG  EKG Interpretation None       Radiology No results  found.  Procedures Procedures (including critical care time)  Medications Ordered in ED Medications  ondansetron (ZOFRAN-ODT) disintegrating tablet 4 mg (4 mg Oral Given 08/11/16 2246)  acetaminophen (TYLENOL) tablet 650 mg (650 mg Oral Given 08/11/16 2246)     Initial Impression / Assessment and Plan / ED Course  I have reviewed the triage vital signs and the nursing notes.  Pertinent labs & imaging results that were available during my care of the patient were reviewed by me and considered in my medical decision making (see chart for details).  Clinical Course     Vitals:   08/11/16 1959 08/11/16 2000 08/11/16 2321  BP: 124/64  130/58  Pulse: 73  80  Resp: 18  16  Temp: 99 F (37.2 C)  99.6 F (37.6 C)  TempSrc: Oral  Oral  SpO2: 100%  100%  Weight:  94.3 kg   Height:  5' (1.524 m)     Medications  ondansetron (ZOFRAN-ODT) disintegrating tablet 4 mg (4 mg Oral Given 08/11/16 2246)  acetaminophen (TYLENOL) tablet 650 mg (650 mg Oral Given 08/11/16 2246)    Susan Hogan is 55 y.o. female presenting with Diffuse myalgia, nausea vomiting diarrhea and tactile fever and chills. Afebrile and well-appearing, well-hydrated. Abdominal exam is benign, blood work reassuring. Patient is given Zofran and has passed by mouth challenge. Work note provided, patient given Zofran with had an extensive discussion of return precautions  patient verbalizes understanding in teaching back technique.  Evaluation does not show pathology that would require ongoing emergent intervention or inpatient treatment. Pt is hemodynamically stable and mentating appropriately. Discussed findings and plan with patient/guardian, who agrees with care plan. All questions answered. Return precautions discussed and outpatient follow up given.      Final Clinical Impressions(s) / ED Diagnoses   Final diagnoses:  Viral syndrome    New Prescriptions Discharge Medication List as of 08/11/2016 11:21 PM    START taking these medications   Details  ondansetron (ZOFRAN) 4 MG tablet Take 1 tablet (4 mg total) by mouth every 8 (eight) hours as needed for nausea or vomiting., Starting Tue 08/11/2016, Print         Dakai Braithwaite, PA-C 08/12/16 4196    Glynn Octave, MD 08/12/16 [phone]

## 2016-08-11 NOTE — ED Triage Notes (Signed)
THE PT IS C/O GENERALIZED BODY ACHES PAIN ALL OVER HER BODY N V AND DIARRHEA HANDS COLD   LAST IBU 1400

## 2016-08-11 NOTE — Discharge Instructions (Signed)
Take acetaminophen (Tylenol) up to 650 mg (this is normally 2 over-the-counter pills) up to every 4-6 hours as needed for pain control. Make sure your other medications do not contain acetaminophen (Read the labels!)  Do not hesitate to return to the emergency room for any new, worsening or concerning symptoms.  Please obtain primary care using resource guide below. Let them know that you were seen in the emergency room and that they will need to obtain records for further outpatient management.

## 2016-08-22 DIAGNOSIS — E119 Type 2 diabetes mellitus without complications: Secondary | ICD-10-CM | POA: Diagnosis not present

## 2016-08-22 DIAGNOSIS — M549 Dorsalgia, unspecified: Secondary | ICD-10-CM | POA: Diagnosis not present

## 2016-08-22 DIAGNOSIS — R1013 Epigastric pain: Secondary | ICD-10-CM | POA: Diagnosis not present

## 2016-08-22 DIAGNOSIS — M797 Fibromyalgia: Secondary | ICD-10-CM | POA: Diagnosis not present

## 2016-08-22 DIAGNOSIS — Z780 Asymptomatic menopausal state: Secondary | ICD-10-CM | POA: Diagnosis not present

## 2016-08-22 DIAGNOSIS — K838 Other specified diseases of biliary tract: Secondary | ICD-10-CM | POA: Diagnosis not present

## 2016-08-22 DIAGNOSIS — R319 Hematuria, unspecified: Secondary | ICD-10-CM | POA: Diagnosis not present

## 2016-08-22 DIAGNOSIS — R112 Nausea with vomiting, unspecified: Secondary | ICD-10-CM | POA: Diagnosis not present

## 2016-08-22 DIAGNOSIS — K828 Other specified diseases of gallbladder: Secondary | ICD-10-CM | POA: Diagnosis not present

## 2016-08-22 DIAGNOSIS — R1011 Right upper quadrant pain: Secondary | ICD-10-CM | POA: Diagnosis not present

## 2016-08-27 DIAGNOSIS — R1031 Right lower quadrant pain: Secondary | ICD-10-CM | POA: Diagnosis not present

## 2016-08-27 DIAGNOSIS — K759 Inflammatory liver disease, unspecified: Secondary | ICD-10-CM | POA: Diagnosis not present

## 2016-08-27 DIAGNOSIS — R1011 Right upper quadrant pain: Secondary | ICD-10-CM | POA: Diagnosis not present

## 2016-08-27 DIAGNOSIS — R1314 Dysphagia, pharyngoesophageal phase: Secondary | ICD-10-CM | POA: Diagnosis not present

## 2016-08-27 DIAGNOSIS — R634 Abnormal weight loss: Secondary | ICD-10-CM | POA: Diagnosis not present

## 2016-08-27 DIAGNOSIS — R7989 Other specified abnormal findings of blood chemistry: Secondary | ICD-10-CM | POA: Diagnosis not present

## 2016-09-04 DIAGNOSIS — K649 Unspecified hemorrhoids: Secondary | ICD-10-CM | POA: Diagnosis not present

## 2016-09-04 DIAGNOSIS — R1011 Right upper quadrant pain: Secondary | ICD-10-CM | POA: Diagnosis not present

## 2016-09-04 DIAGNOSIS — R1031 Right lower quadrant pain: Secondary | ICD-10-CM | POA: Diagnosis not present

## 2016-09-04 DIAGNOSIS — K229 Disease of esophagus, unspecified: Secondary | ICD-10-CM | POA: Diagnosis not present

## 2016-09-04 DIAGNOSIS — R1314 Dysphagia, pharyngoesophageal phase: Secondary | ICD-10-CM | POA: Diagnosis not present

## 2016-09-04 DIAGNOSIS — K648 Other hemorrhoids: Secondary | ICD-10-CM | POA: Diagnosis not present

## 2016-09-27 DIAGNOSIS — E119 Type 2 diabetes mellitus without complications: Secondary | ICD-10-CM | POA: Diagnosis not present

## 2016-09-27 DIAGNOSIS — M545 Low back pain: Secondary | ICD-10-CM | POA: Diagnosis not present

## 2016-09-27 DIAGNOSIS — R1013 Epigastric pain: Secondary | ICD-10-CM | POA: Diagnosis not present

## 2016-09-27 DIAGNOSIS — R1032 Left lower quadrant pain: Secondary | ICD-10-CM | POA: Diagnosis not present

## 2016-09-27 DIAGNOSIS — M549 Dorsalgia, unspecified: Secondary | ICD-10-CM | POA: Diagnosis not present

## 2016-09-27 DIAGNOSIS — R109 Unspecified abdominal pain: Secondary | ICD-10-CM | POA: Diagnosis not present

## 2016-09-27 DIAGNOSIS — M797 Fibromyalgia: Secondary | ICD-10-CM | POA: Diagnosis not present

## 2016-09-27 DIAGNOSIS — Z880 Allergy status to penicillin: Secondary | ICD-10-CM | POA: Diagnosis not present

## 2016-09-27 DIAGNOSIS — Z6839 Body mass index (BMI) 39.0-39.9, adult: Secondary | ICD-10-CM | POA: Diagnosis not present

## 2016-09-27 DIAGNOSIS — Z888 Allergy status to other drugs, medicaments and biological substances status: Secondary | ICD-10-CM | POA: Diagnosis not present

## 2016-09-27 DIAGNOSIS — Z780 Asymptomatic menopausal state: Secondary | ICD-10-CM | POA: Diagnosis not present

## 2016-10-02 ENCOUNTER — Emergency Department (HOSPITAL_COMMUNITY)
Admission: EM | Admit: 2016-10-02 | Discharge: 2016-10-02 | Disposition: A | Discharge status: Home / Self Care | Payer: Medicare Other | Attending: Emergency Medicine | Admitting: Emergency Medicine

## 2016-10-02 ENCOUNTER — Encounter (HOSPITAL_COMMUNITY): Payer: Self-pay | Admitting: Emergency Medicine

## 2016-10-02 DIAGNOSIS — E119 Type 2 diabetes mellitus without complications: Secondary | ICD-10-CM | POA: Diagnosis not present

## 2016-10-02 DIAGNOSIS — I10 Essential (primary) hypertension: Secondary | ICD-10-CM | POA: Insufficient documentation

## 2016-10-02 DIAGNOSIS — R05 Cough: Secondary | ICD-10-CM | POA: Diagnosis present

## 2016-10-02 DIAGNOSIS — J069 Acute upper respiratory infection, unspecified: Secondary | ICD-10-CM | POA: Diagnosis not present

## 2016-10-02 DIAGNOSIS — Z79899 Other long term (current) drug therapy: Secondary | ICD-10-CM | POA: Insufficient documentation

## 2016-10-02 DIAGNOSIS — J45909 Unspecified asthma, uncomplicated: Secondary | ICD-10-CM | POA: Diagnosis not present

## 2016-10-02 LAB — RAPID STREP SCREEN (MED CTR MEBANE ONLY): Streptococcus, Group A Screen (Direct): NEGATIVE

## 2016-10-02 MED ORDER — ALBUTEROL SULFATE HFA 108 (90 BASE) MCG/ACT IN AERS: 2.0000 | INHALATION_SPRAY | RESPIRATORY_TRACT | 1 refills | Status: DC | PRN

## 2016-10-02 MED ORDER — KETOROLAC TROMETHAMINE 60 MG/2ML IM SOLN
60.0000 mg | Freq: Once | INTRAMUSCULAR | Status: AC
  Administered 2016-10-02: 60 mg via INTRAMUSCULAR
  Filled 2016-10-02: qty 2

## 2016-10-02 MED ORDER — BENZONATATE 100 MG PO CAPS: 100.0000 mg | ORAL_CAPSULE | Freq: Three times a day (TID) | ORAL | 0 refills | Status: DC

## 2016-10-02 NOTE — ED Triage Notes (Signed)
Pt complaint of nasal congestion and non productive cough for 2 days. Pain only with cough.

## 2016-10-02 NOTE — Discharge Instructions (Signed)
Your symptoms are consistent with a viral illness. Viruses do not require antibiotics. Treatment is symptomatic care and it is important to note that these symptoms may last for 7-10 days. Drink plenty of fluids and get plenty of rest. You should be drinking at least half a liter of water an hour to stay hydrated. Electrolyte drinks are also encouraged. Ibuprofen, Naproxen, or Tylenol for pain or fever. Tessalon for cough. Plain Mucinex may help relieve congestion. Warm liquids or Chloraseptic spray may help soothe a sore throat. Follow up with a primary care provider, as needed, for any future management of this issue. Performing sinus rinses will also be helpful to relieve congestion.

## 2016-10-02 NOTE — ED Provider Notes (Signed)
[facility]-EMERGENCY DEPT Provider Note   CSN: [id number] Arrival date & time: 10/02/16  1317     History   Chief Complaint Chief Complaint  Patient presents with  . Cough  . Nasal Congestion    HPI Susan Hogan is a 56 y.o. female.  HPI   Susan Hogan is a 56 y.o. female, with a history of DM and asthma, presenting to the ED with nonproductive cough, nasal and sinus congestion, postnasal drip, and body aches for the last 2 days. Chest discomfort with cough. Has tried tylenol cold and flu with minimal relief. Denies SOB, current chest discomfort, N/V/D, fever, or any other complaints.    Past Medical History:  Diagnosis Date  . Anemia   . Arthritis    RA "states does not see Rheumatologist"  . Asthma   . Carpal tunnel syndrome    bilaterally  . Complication of anesthesia    difficulty breathing  . Degenerative disk disease    back  . Depression   . Diabetes mellitus   . Fibromyalgia   . GERD (gastroesophageal reflux disease)   . Headache(784.0)    migraines  . Heart murmur   . Hepatitis    In followup treatment for hepatitis C  . Hypertension    no medication for at this time, sees Dr. Estevan Oaks Pcp  . Hypotensive episode    hx of  . Irritable bowel syndrome   . Kidney stone    with hematuria  . Kidney stones    with hematuria  . Recurrent upper respiratory infection (URI)   . Shortness of breath   . Syncope     Patient Active Problem List   Diagnosis Date Noted  . Syncope 10/05/2014  . Diabetes mellitus (HCC) 07/25/2014  . Fibromyalgia 07/25/2014  . MDD (major depressive disorder), recurrent severe, without psychosis (HCC) 03/17/2013  . HEPATITIS C 08/02/2007  . IRRITABLE BOWEL SYNDROME 08/02/2007  . DYSPNEA ON EXERTION 08/02/2007    Past Surgical History:  Procedure Laterality Date  . DILATION AND CURETTAGE OF UTERUS    . LIVER BIOPSY    . TONSILLECTOMY    . TUBAL LIGATION      OB History    No data available       Home  Medications    Prior to Admission medications   Medication Sig Start Date End Date Taking? Authorizing Provider  albuterol (PROVENTIL HFA;VENTOLIN HFA) 108 (90 Base) MCG/ACT inhaler Inhale 2 puffs into the lungs every 4 (four) hours as needed for wheezing or shortness of breath. 10/02/16   Shawn C Joy, PA-C  benzonatate (TESSALON) 100 MG capsule Take 1 capsule (100 mg total) by mouth every 8 (eight) hours. 10/02/16   Shawn C Joy, PA-C  Cyanocobalamin (VITAMIN B-12 PO) Take 1 tablet by mouth daily.    Historical Provider, MD  ferrous sulfate 325 (65 FE) MG tablet Take 325 mg by mouth daily with breakfast.    Historical Provider, MD  glucose blood (ACCU-CHEK ACTIVE STRIPS) test strip Use as instructed 04/15/16   Barrett Henle, PA-C  HYDROcodone-acetaminophen (NORCO/VICODIN) 5-325 MG tablet Take 1 tablet by mouth every 4 (four) hours as needed. 04/15/16   Barrett Henle, PA-C  ibuprofen (ADVIL,MOTRIN) 600 MG tablet Take 1 tablet (600 mg total) by mouth every 6 (six) hours as needed. 04/15/16   Barrett Henle, PA-C  Multiple Vitamins-Minerals (HAIR/SKIN/NAILS) TABS Take 1 tablet by mouth daily.    Historical Provider, MD  ondansetron (ZOFRAN) 4 MG tablet  Take 1 tablet (4 mg total) by mouth every 8 (eight) hours as needed for nausea or vomiting. 08/11/16   Nicole Pisciotta, PA-C  pantoprazole (PROTONIX) 40 MG tablet Take 1 tablet (40 mg total) by mouth daily. 09/18/15   Azalia Bilis, MD    Family History Family History  Problem Relation Age of Onset  . Heart disease Mother   . Diabetes Mother   . Asthma Mother   . Anemia Mother   . Colon cancer Maternal Uncle   . Heart disease Maternal Uncle   . Heart disease Maternal Grandmother   . Diabetes Maternal Grandmother   . Asthma Maternal Grandmother     Social History Social History  Substance Use Topics  . Smoking status: Never Smoker  . Smokeless tobacco: Never Used  . Alcohol use No     Allergies   Anesthetic  ether [ether]; Nubain [nalbuphine hcl]; Penicillins; Advair diskus [fluticasone-salmeterol]; and Lactose intolerance (gi)   Review of Systems Review of Systems  Constitutional: Positive for chills. Negative for fever.  HENT: Positive for congestion and postnasal drip.   Respiratory: Positive for cough. Negative for shortness of breath.   Gastrointestinal: Negative for abdominal pain, diarrhea, nausea and vomiting.  Musculoskeletal: Negative for back pain and neck pain.  Skin: Negative for rash.  All other systems reviewed and are negative.    Physical Exam Updated Vital Signs BP 148/81 (BP Location: Left Arm)   Pulse 75   Temp 98.7 F (37.1 C) (Oral)   Resp 18   SpO2 98%   Physical Exam  Constitutional: She appears well-developed and well-nourished. No distress.  HENT:  Head: Normocephalic and atraumatic.  Mouth/Throat: Uvula is midline, oropharynx is clear and moist and mucous membranes are normal.  No tenderness over the frontal or maxillary sinuses.  Eyes: Conjunctivae are normal.  Neck: Neck supple.  Cardiovascular: Normal rate, regular rhythm, normal heart sounds and intact distal pulses.   Pulmonary/Chest: Effort normal and breath sounds normal. No respiratory distress.  Abdominal: Soft. There is no tenderness. There is no guarding.  Musculoskeletal: She exhibits no edema.  Lymphadenopathy:    She has no cervical adenopathy.  Neurological: She is alert.  Skin: Skin is warm and dry. She is not diaphoretic.  Psychiatric: She has a normal mood and affect. Her behavior is normal.  Nursing note and vitals reviewed.    ED Treatments / Results  Labs (all labs ordered are listed, but only abnormal results are displayed) Labs Reviewed  RAPID STREP SCREEN (NOT AT [facility])  CULTURE, GROUP A STREP [facility])    EKG  EKG Interpretation None       Radiology No results found.  Procedures Procedures (including critical care time)  Medications Ordered in  ED Medications  ketorolac (TORADOL) injection 60 mg (not administered)     Initial Impression / Assessment and Plan / ED Course  I have reviewed the triage vital signs and the nursing notes.  Pertinent labs & imaging results that were available during my care of the patient were reviewed by me and considered in my medical decision making (see chart for details).  Clinical Course     Patient presents with URI symptoms for the last 2 days. Patient is nontoxic appearing and vitals are normal. PCP follow-up as needed. Symptomatic care and return precautions discussed.      Final Clinical Impressions(s) / ED Diagnoses   Final diagnoses:  Upper respiratory tract infection, unspecified type    New Prescriptions New Prescriptions  ALBUTEROL (PROVENTIL HFA;VENTOLIN HFA) 108 (90 BASE) MCG/ACT INHALER    Inhale 2 puffs into the lungs every 4 (four) hours as needed for wheezing or shortness of breath.   BENZONATATE (TESSALON) 100 MG CAPSULE    Take 1 capsule (100 mg total) by mouth every 8 (eight) hours.     Anselm Pancoast, PA-C 10/02/16 1529    Lyndal Pulley, MD 10/03/16 [phone]

## 2016-10-04 ENCOUNTER — Emergency Department (HOSPITAL_COMMUNITY)
Admission: EM | Admit: 2016-10-04 | Discharge: 2016-10-04 | Disposition: A | Discharge status: Home / Self Care | Payer: Medicare Other | Attending: Emergency Medicine | Admitting: Emergency Medicine

## 2016-10-04 ENCOUNTER — Emergency Department (HOSPITAL_COMMUNITY): Payer: Medicare Other

## 2016-10-04 ENCOUNTER — Encounter (HOSPITAL_COMMUNITY): Payer: Self-pay | Admitting: Oncology

## 2016-10-04 DIAGNOSIS — E119 Type 2 diabetes mellitus without complications: Secondary | ICD-10-CM | POA: Insufficient documentation

## 2016-10-04 DIAGNOSIS — R0602 Shortness of breath: Secondary | ICD-10-CM | POA: Diagnosis not present

## 2016-10-04 DIAGNOSIS — I10 Essential (primary) hypertension: Secondary | ICD-10-CM | POA: Diagnosis not present

## 2016-10-04 DIAGNOSIS — J40 Bronchitis, not specified as acute or chronic: Secondary | ICD-10-CM | POA: Diagnosis not present

## 2016-10-04 DIAGNOSIS — R079 Chest pain, unspecified: Secondary | ICD-10-CM | POA: Diagnosis not present

## 2016-10-04 DIAGNOSIS — R05 Cough: Secondary | ICD-10-CM | POA: Diagnosis not present

## 2016-10-04 MED ORDER — PREDNISONE 20 MG PO TABS: 40.0000 mg | ORAL_TABLET | Freq: Every day | ORAL | 0 refills | Status: DC

## 2016-10-04 MED ORDER — KETOROLAC TROMETHAMINE 15 MG/ML IJ SOLN
15.0000 mg | Freq: Once | INTRAMUSCULAR | Status: AC
  Administered 2016-10-04: 15 mg via INTRAMUSCULAR
  Filled 2016-10-04: qty 1

## 2016-10-04 MED ORDER — HYDROCORTISONE-ACETIC ACID 1-2 % OT SOLN: 4.0000 [drp] | Freq: Four times a day (QID) | OTIC | 0 refills | Status: DC

## 2016-10-04 MED ORDER — DEXAMETHASONE 4 MG PO TABS
12.0000 mg | ORAL_TABLET | Freq: Once | ORAL | Status: AC
  Administered 2016-10-04: 12 mg via ORAL
  Filled 2016-10-04: qty 3

## 2016-10-04 NOTE — ED Triage Notes (Signed)
Pt has had cough, congestion, sneezing x 5 days.  Pt seen for the same and rx tessalon pearls.  Pt states her chest and back hurt as she cannot quit coughing.  Rates pain 10/10, sharp in nature and worse w/ cough.

## 2016-10-05 ENCOUNTER — Encounter (HOSPITAL_COMMUNITY): Payer: Self-pay | Admitting: Emergency Medicine

## 2016-10-05 LAB — CULTURE, GROUP A STREP (THRC)

## 2016-10-10 ENCOUNTER — Emergency Department (HOSPITAL_COMMUNITY)
Admission: EM | Admit: 2016-10-10 | Discharge: 2016-10-10 | Disposition: A | Discharge status: Home / Self Care | Payer: Medicare Other | Attending: Emergency Medicine | Admitting: Emergency Medicine

## 2016-10-10 ENCOUNTER — Emergency Department (HOSPITAL_COMMUNITY): Payer: Medicare Other

## 2016-10-10 ENCOUNTER — Encounter (HOSPITAL_COMMUNITY): Payer: Self-pay | Admitting: Nurse Practitioner

## 2016-10-10 DIAGNOSIS — E119 Type 2 diabetes mellitus without complications: Secondary | ICD-10-CM | POA: Diagnosis not present

## 2016-10-10 DIAGNOSIS — J209 Acute bronchitis, unspecified: Secondary | ICD-10-CM | POA: Insufficient documentation

## 2016-10-10 DIAGNOSIS — J40 Bronchitis, not specified as acute or chronic: Secondary | ICD-10-CM | POA: Diagnosis present

## 2016-10-10 DIAGNOSIS — J45909 Unspecified asthma, uncomplicated: Secondary | ICD-10-CM | POA: Diagnosis not present

## 2016-10-10 DIAGNOSIS — R0602 Shortness of breath: Secondary | ICD-10-CM | POA: Diagnosis not present

## 2016-10-10 DIAGNOSIS — I10 Essential (primary) hypertension: Secondary | ICD-10-CM | POA: Diagnosis not present

## 2016-10-10 LAB — URINALYSIS, ROUTINE W REFLEX MICROSCOPIC
BILIRUBIN URINE: NEGATIVE
Bacteria, UA: NONE SEEN
Glucose, UA: NEGATIVE mg/dL
Ketones, ur: NEGATIVE mg/dL
Nitrite: NEGATIVE
PH: 5 (ref 5.0–8.0)
Protein, ur: NEGATIVE mg/dL
Specific Gravity, Urine: 1.019 (ref 1.005–1.030)

## 2016-10-10 LAB — I-STAT CHEM 8, ED
BUN: 13 mg/dL (ref 6–20)
CHLORIDE: 107 mmol/L (ref 101–111)
Calcium, Ion: 1.15 mmol/L (ref 1.15–1.40)
Creatinine, Ser: 0.6 mg/dL (ref 0.44–1.00)
GLUCOSE: 118 mg/dL — AB (ref 65–99)
HEMATOCRIT: 41 % (ref 36.0–46.0)
HEMOGLOBIN: 13.9 g/dL (ref 12.0–15.0)
POTASSIUM: 3.4 mmol/L — AB (ref 3.5–5.1)
SODIUM: 141 mmol/L (ref 135–145)
TCO2: 25 mmol/L (ref 0–100)

## 2016-10-10 LAB — CBC
HCT: 41.7 % (ref 36.0–46.0)
Hemoglobin: 15 g/dL (ref 12.0–15.0)
MCH: 34.8 pg — ABNORMAL HIGH (ref 26.0–34.0)
MCHC: 36 g/dL (ref 30.0–36.0)
MCV: 96.8 fL (ref 78.0–100.0)
PLATELETS: 176 10*3/uL (ref 150–400)
RBC: 4.31 MIL/uL (ref 3.87–5.11)
RDW: 12.4 % (ref 11.5–15.5)
WBC: 14.6 10*3/uL — AB (ref 4.0–10.5)

## 2016-10-10 LAB — TROPONIN I

## 2016-10-10 MED ORDER — AZITHROMYCIN 250 MG PO TABS: 250.0000 mg | ORAL_TABLET | Freq: Every day | ORAL | 0 refills | Status: DC

## 2016-10-10 NOTE — ED Triage Notes (Signed)
Pt states she was seen on the 14th and 17th of this month and diagnosed with bronchitis for both encounters, adds that she hasn't felt any better and would like to have blood tests done on her.C/o cold symptoms, congestion and recurrent cough.

## 2016-10-10 NOTE — ED Provider Notes (Signed)
[facility]-EMERG[facility] DEPT Provider Note   CSN: [id number] Arrival date & time: 10/10/16  3614    By signing my name below, I, Valentino Saxon, attest that this documentation has been prepared under the direction and in the presence of Dione Booze, MD. Electronically Signed: Valentino Saxon, ED Scribe. 10/10/16. 3:41 AM.  History   Chief Complaint Chief Complaint  Patient presents with  . Bronchitis    Seen on 14th, and 17th   The history is provided by the patient. No language interpreter was used.   HPI Comments: Susan Hogan is a 56 y.o. female with PMHx of fibromyalgia, DM, asthma, HTN, GERDwho presents to the Emergency Department complaining of moderate, constant, bronchitis onset a week ago. Pt reports associated SOB and chest pain that radiates to her mid back. She notes her chest pain is exacerbated with deep breathing. Pt was last seen in the ED on 01/12 and 01/14 for similar symptoms. She also notes having intermittent "cold chills" followed by a frequent cough without sputum. Pt states her prescribed inhaler provides no relief. Denies fever.   Past Medical History:  Diagnosis Date  . Anemia   . Arthritis    RA "states does not see Rheumatologist"  . Asthma   . Carpal tunnel syndrome    bilaterally  . Complication of anesthesia    difficulty breathing  . Degenerative disk disease    back  . Depression   . Diabetes mellitus   . Fibromyalgia   . GERD (gastroesophageal reflux disease)   . Headache(784.0)    migraines  . Heart murmur   . Hepatitis    In followup treatment for hepatitis C  . Hypertension    no medication for at this time, sees Dr. Estevan Oaks Pcp  . Hypotensive episode    hx of  . Irritable bowel syndrome   . Kidney stone    with hematuria  . Kidney stones    with hematuria  . Recurrent upper respiratory infection (URI)   . Shortness of breath   . Syncope     Patient Active Problem List   Diagnosis Date Noted  . Syncope 10/05/2014  .  Diabetes mellitus (HCC) 07/25/2014  . Fibromyalgia 07/25/2014  . MDD (major depressive disorder), recurrent severe, without psychosis (HCC) 03/17/2013  . HEPATITIS C 08/02/2007  . IRRITABLE BOWEL SYNDROME 08/02/2007  . DYSPNEA ON EXERTION 08/02/2007    Past Surgical History:  Procedure Laterality Date  . DILATION AND CURETTAGE OF UTERUS    . LIVER BIOPSY    . TONSILLECTOMY    . TUBAL LIGATION      OB History    No data available       Home Medications    Prior to Admission medications   Medication Sig Start Date End Date Taking? Authorizing Provider  acetic acid-hydrocortisone (VOSOL-HC) otic solution Place 4 drops into the right ear 4 (four) times daily. 10/04/16   Raeford Razor, MD  albuterol (PROVENTIL HFA;VENTOLIN HFA) 108 (90 Base) MCG/ACT inhaler Inhale 2 puffs into the lungs every 4 (four) hours as needed for wheezing or shortness of breath. 10/02/16   Shawn C Joy, PA-C  benzonatate (TESSALON) 100 MG capsule Take 1 capsule (100 mg total) by mouth every 8 (eight) hours. 10/02/16   Shawn C Joy, PA-C  Cyanocobalamin (VITAMIN B-12 PO) Take 1 tablet by mouth daily.    Historical Provider, MD  ferrous sulfate 325 (65 FE) MG tablet Take 325 mg by mouth daily with breakfast.  Historical Provider, MD  glucose blood (ACCU-CHEK ACTIVE STRIPS) test strip Use as instructed 04/15/16   Barrett Henle, PA-C  HYDROcodone-acetaminophen (NORCO/VICODIN) 5-325 MG tablet Take 1 tablet by mouth every 4 (four) hours as needed. 04/15/16   Barrett Henle, PA-C  ibuprofen (ADVIL,MOTRIN) 600 MG tablet Take 1 tablet (600 mg total) by mouth every 6 (six) hours as needed. 04/15/16   Barrett Henle, PA-C  Multiple Vitamins-Minerals (HAIR/SKIN/NAILS) TABS Take 1 tablet by mouth daily.    Historical Provider, MD  ondansetron (ZOFRAN) 4 MG tablet Take 1 tablet (4 mg total) by mouth every 8 (eight) hours as needed for nausea or vomiting. 08/11/16   Nicole Pisciotta, PA-C  pantoprazole  (PROTONIX) 40 MG tablet Take 1 tablet (40 mg total) by mouth daily. 09/18/15   Azalia Bilis, MD  predniSONE (DELTASONE) 20 MG tablet Take 2 tablets (40 mg total) by mouth daily. 10/04/16   Raeford Razor, MD    Family History Family History  Problem Relation Age of Onset  . Heart disease Mother   . Diabetes Mother   . Asthma Mother   . Anemia Mother   . Colon cancer Maternal Uncle   . Heart disease Maternal Uncle   . Heart disease Maternal Grandmother   . Diabetes Maternal Grandmother   . Asthma Maternal Grandmother     Social History Social History  Substance Use Topics  . Smoking status: Never Smoker  . Smokeless tobacco: Never Used  . Alcohol use No     Allergies   Anesthetic ether [ether]; Nubain [nalbuphine hcl]; Penicillins; Advair diskus [fluticasone-salmeterol]; and Lactose intolerance (gi)   Review of Systems Review of Systems  Constitutional: Negative for fever.  Respiratory: Positive for cough and shortness of breath.   Cardiovascular: Positive for chest pain.  Musculoskeletal: Positive for back pain.  All other systems reviewed and are negative.    Physical Exam Updated Vital Signs BP 147/84 (BP Location: Left Arm)   Pulse 88   Temp 98 F (36.7 C) (Oral)   Resp 20   SpO2 99%   Physical Exam  Constitutional: She is oriented to person, place, and time. She appears well-developed and well-nourished.  HENT:  Head: Normocephalic and atraumatic.  Eyes: EOM are normal. Pupils are equal, round, and reactive to light.  Neck: Normal range of motion. Neck supple. No JVD present.  Cardiovascular: Normal rate, regular rhythm and normal heart sounds.   No murmur heard. Pulmonary/Chest: Effort normal and breath sounds normal. She has no wheezes. She has no rales. She exhibits no tenderness.  Abdominal: Soft. Bowel sounds are normal. She exhibits no distension and no mass. There is no tenderness.  Musculoskeletal: Normal range of motion. She exhibits no edema.    Lymphadenopathy:    She has no cervical adenopathy.  Neurological: She is alert and oriented to person, place, and time. No cranial nerve deficit. She exhibits normal muscle tone. Coordination normal.  Skin: Skin is warm and dry. No rash noted.  Psychiatric: She has a normal mood and affect. Her behavior is normal. Judgment and thought content normal.  Nursing note and vitals reviewed.    ED Treatments / Results   DIAGNOSTIC STUDIES: Oxygen Saturation is 99% on RA, normal by my interpretation.    COORDINATION OF CARE: 3:35 AM Discussed treatment plan with pt at bedside which includes labs, chest imaging and EKG and pt agreed to plan.   Labs (all labs ordered are listed, but only abnormal results are displayed) Labs Reviewed  CBC - Abnormal; Notable for the following:       Result Value   WBC 14.6 (*)    MCH 34.8 (*)    All other components within normal limits  I-STAT CHEM 8, ED - Abnormal; Notable for the following:    Potassium 3.4 (*)    Glucose, Bld 118 (*)    All other components within normal limits  TROPONIN I  URINALYSIS, ROUTINE W REFLEX MICROSCOPIC    EKG Rhythm: Normal sinus at 64 bpm Axis: Normal Intervals: Normal ST-T wave: Normal Low voltage in the precordial leads. When compared with ECG of generous 16 2018, no significant changes are seen.   Radiology Dg Chest 2 View  Result Date: 10/10/2016 CLINICAL DATA:  Moderate constant bronchitis and shortness of breath EXAM: CHEST  2 VIEW COMPARISON:  10/04/2016 FINDINGS: The heart size and mediastinal contours are within normal limits. Both lungs are clear. The visualized skeletal structures are unremarkable. IMPRESSION: No active cardiopulmonary disease. Electronically Signed   By: Jasmine Pang M.D.   On: 10/10/2016 04:16    Procedures Procedures (including critical care time)  Medications Ordered in ED Medications - No data to display   Initial Impression / Assessment and Plan / ED Course  I have  reviewed the triage vital signs and the nursing notes.  Pertinent labs & imaging results that were available during my care of the patient were reviewed by me and considered in my medical decision making (see chart for details).  Respiratory tract infection which has failed to respond to outpatient management thus far. Old records are reviewed, and she does have 2 prior ED visits for respiratory tract infection last week. Treatment has included albuterol, benzonatate, and prednisone without any benefit. Chest x-ray is obtained showing no evidence of pneumonia. She is concerned that her grandmother died in her sleep and was having night sweats. ECG is obtained showing no change from prior ECG. Given her failure to improve over the course of week, it is elected to start her on antibiotics and she is discharged with prescription for azithromycin.  Final Clinical Impressions(s) / ED Diagnoses   Final diagnoses:  Acute bronchitis, unspecified organism    New Prescriptions New Prescriptions   AZITHROMYCIN (ZITHROMAX) 250 MG TABLET    Take 1 tablet (250 mg total) by mouth daily. Take first 2 tablets together, then 1 every day until finished.    I personally performed the services described in this documentation, which was scribed in my presence. The recorded information has been reviewed and is accurate.       Dione Booze, MD 10/10/16 [phone]

## 2016-10-12 ENCOUNTER — Encounter (HOSPITAL_COMMUNITY): Payer: Self-pay | Admitting: Emergency Medicine

## 2016-10-12 ENCOUNTER — Emergency Department (HOSPITAL_COMMUNITY): Payer: Medicare Other

## 2016-10-12 ENCOUNTER — Emergency Department (HOSPITAL_COMMUNITY)
Admission: EM | Admit: 2016-10-12 | Discharge: 2016-10-12 | Disposition: A | Discharge status: Home / Self Care | Payer: Medicare Other | Attending: Emergency Medicine | Admitting: Emergency Medicine

## 2016-10-12 DIAGNOSIS — R1011 Right upper quadrant pain: Secondary | ICD-10-CM | POA: Diagnosis not present

## 2016-10-12 DIAGNOSIS — R7989 Other specified abnormal findings of blood chemistry: Secondary | ICD-10-CM

## 2016-10-12 DIAGNOSIS — R945 Abnormal results of liver function studies: Secondary | ICD-10-CM | POA: Diagnosis not present

## 2016-10-12 DIAGNOSIS — Z79899 Other long term (current) drug therapy: Secondary | ICD-10-CM | POA: Diagnosis not present

## 2016-10-12 DIAGNOSIS — J45909 Unspecified asthma, uncomplicated: Secondary | ICD-10-CM | POA: Diagnosis not present

## 2016-10-12 DIAGNOSIS — R109 Unspecified abdominal pain: Secondary | ICD-10-CM | POA: Diagnosis not present

## 2016-10-12 DIAGNOSIS — R1013 Epigastric pain: Secondary | ICD-10-CM | POA: Diagnosis present

## 2016-10-12 DIAGNOSIS — E119 Type 2 diabetes mellitus without complications: Secondary | ICD-10-CM | POA: Insufficient documentation

## 2016-10-12 DIAGNOSIS — R079 Chest pain, unspecified: Secondary | ICD-10-CM | POA: Diagnosis not present

## 2016-10-12 DIAGNOSIS — R05 Cough: Secondary | ICD-10-CM | POA: Diagnosis not present

## 2016-10-12 DIAGNOSIS — I10 Essential (primary) hypertension: Secondary | ICD-10-CM | POA: Diagnosis not present

## 2016-10-12 LAB — COMPREHENSIVE METABOLIC PANEL
ALK PHOS: 68 U/L (ref 38–126)
ALT: 161 U/L — AB (ref 14–54)
AST: 108 U/L — AB (ref 15–41)
Albumin: 3.6 g/dL (ref 3.5–5.0)
Anion gap: 10 (ref 5–15)
BILIRUBIN TOTAL: 0.6 mg/dL (ref 0.3–1.2)
BUN: 17 mg/dL (ref 6–20)
CALCIUM: 9 mg/dL (ref 8.9–10.3)
CO2: 27 mmol/L (ref 22–32)
CREATININE: 0.7 mg/dL (ref 0.44–1.00)
Chloride: 103 mmol/L (ref 101–111)
Glucose, Bld: 122 mg/dL — ABNORMAL HIGH (ref 65–99)
Potassium: 4.1 mmol/L (ref 3.5–5.1)
Sodium: 140 mmol/L (ref 135–145)
Total Protein: 6 g/dL — ABNORMAL LOW (ref 6.5–8.1)

## 2016-10-12 LAB — URINALYSIS, ROUTINE W REFLEX MICROSCOPIC
BILIRUBIN URINE: NEGATIVE
GLUCOSE, UA: NEGATIVE mg/dL
KETONES UR: NEGATIVE mg/dL
Nitrite: NEGATIVE
PH: 5 (ref 5.0–8.0)
Protein, ur: NEGATIVE mg/dL
SPECIFIC GRAVITY, URINE: 1.024 (ref 1.005–1.030)

## 2016-10-12 LAB — CBC
HCT: 44.4 % (ref 36.0–46.0)
Hemoglobin: 15.1 g/dL — ABNORMAL HIGH (ref 12.0–15.0)
MCH: 33.4 pg (ref 26.0–34.0)
MCHC: 34 g/dL (ref 30.0–36.0)
MCV: 98.2 fL (ref 78.0–100.0)
Platelets: 201 10*3/uL (ref 150–400)
RBC: 4.52 MIL/uL (ref 3.87–5.11)
RDW: 12.7 % (ref 11.5–15.5)
WBC: 14.4 10*3/uL — AB (ref 4.0–10.5)

## 2016-10-12 LAB — MONONUCLEOSIS SCREEN: Mono Screen: NEGATIVE

## 2016-10-12 LAB — I-STAT TROPONIN, ED: Troponin i, poc: 0.01 ng/mL (ref 0.00–0.08)

## 2016-10-12 LAB — LIPASE, BLOOD: Lipase: 23 U/L (ref 11–51)

## 2016-10-12 MED ORDER — ONDANSETRON 4 MG PO TBDP: ORAL_TABLET | ORAL | 0 refills | Status: DC

## 2016-10-12 MED ORDER — HYDROMORPHONE HCL 2 MG/ML IJ SOLN
1.0000 mg | Freq: Once | INTRAMUSCULAR | Status: AC
  Administered 2016-10-12: 1 mg via INTRAVENOUS
  Filled 2016-10-12: qty 1

## 2016-10-12 MED ORDER — SODIUM CHLORIDE 0.9 % IV BOLUS (SEPSIS)
1000.0000 mL | Freq: Once | INTRAVENOUS | Status: AC
  Administered 2016-10-12: 1000 mL via INTRAVENOUS

## 2016-10-12 MED ORDER — ONDANSETRON HCL 4 MG/2ML IJ SOLN
4.0000 mg | Freq: Once | INTRAMUSCULAR | Status: AC
  Administered 2016-10-12: 4 mg via INTRAVENOUS
  Filled 2016-10-12: qty 2

## 2016-10-12 MED ORDER — IOPAMIDOL (ISOVUE-370) INJECTION 76%
INTRAVENOUS | Status: AC
  Administered 2016-10-12: 100 mL
  Filled 2016-10-12: qty 100

## 2016-10-12 MED ORDER — FENTANYL CITRATE (PF) 100 MCG/2ML IJ SOLN: 50.0000 ug | Freq: Once | INTRAMUSCULAR | Status: DC

## 2016-10-12 MED ORDER — FENTANYL CITRATE (PF) 100 MCG/2ML IJ SOLN
50.0000 ug | Freq: Once | INTRAMUSCULAR | Status: AC
  Administered 2016-10-12: 50 ug via INTRAVENOUS
  Filled 2016-10-12: qty 2

## 2016-10-12 MED ORDER — HYDROCODONE-ACETAMINOPHEN 5-325 MG PO TABS: 1.0000 | ORAL_TABLET | Freq: Four times a day (QID) | ORAL | 0 refills | Status: DC | PRN

## 2016-10-12 NOTE — ED Notes (Signed)
Pt refusing to leave at this time. Pt states she feels no better and feels as if she has something happening in her body we have not addressed. Will notify MD.

## 2016-10-12 NOTE — ED Notes (Addendum)
Patient transported to CT 

## 2016-10-12 NOTE — ED Provider Notes (Addendum)
[facility]-EMER[facility]ENCY DEPT Provider Note   CSN: [id number] Arrival date & time: 10/12/16  1326     History   Chief Complaint Chief Complaint  Patient presents with  . Abdominal Pain    HPI Susan Hogan is a 56 y.o. female hx of depression, DM, fibromyalgia, here with abdominal pain, fever, chills. Patient states that she has not been feeling well since the beginning of December. States that she had abdominal pain and was seen in the ED multiple times and eventually one of the US showed sludge in the gallbladder. She then has been having URI and cough and fevers and was seen multiple times and was diagnosed with bronchitis 2 days ago in the ED. Patient had CXR that was clear at that time and was prescribed zpack. Took one dose of azithromycin and had worsening nausea and epigastric pain. Still has chills and subjective fevers and cough. Patient has some episodes of vomiting as well. Patient was scheduled to see surgery tomorrow for consult. Of note, patient had multiple nl CT ab/pel and CT renal stone and RUQ [place] in our system and care everywhere over the last month.   The history is provided by the patient.    Past Medical History:  Diagnosis Date  . Anemia   . Arthritis    RA "states does not see Rheumatologist"  . Asthma   . Carpal tunnel syndrome    bilaterally  . Complication of anesthesia    difficulty breathing  . Degenerative disk disease    back  . Depression   . Diabetes mellitus   . Fibromyalgia   . GERD (gastroesophageal reflux disease)   . Headache(784.0)    migraines  . Heart murmur   . Hepatitis    In followup treatment for hepatitis C  . Hypertension    no medication for at this time, sees Dr. Estevan Oaks Pcp  . Hypotensive episode    hx of  . Irritable bowel syndrome   . Kidney stone    with hematuria  . Kidney stones    with hematuria  . Recurrent upper respiratory infection (URI)   . Shortness of breath   . Syncope     Patient Active Problem  List   Diagnosis Date Noted  . Syncope 10/05/2014  . Diabetes mellitus (HCC) 07/25/2014  . Fibromyalgia 07/25/2014  . MDD (major depressive disorder), recurrent severe, without psychosis (HCC) 03/17/2013  . HEPATITIS C 08/02/2007  . IRRITABLE BOWEL SYNDROME 08/02/2007  . DYSPNEA ON EXERTION 08/02/2007    Past Surgical History:  Procedure Laterality Date  . DILATION AND CURETTAGE OF UTERUS    . LIVER BIOPSY    . TONSILLECTOMY    . TUBAL LIGATION      OB History    No data available       Home Medications    Prior to Admission medications   Medication Sig Start Date End Date Taking? Authorizing Provider  acetic acid-hydrocortisone (VOSOL-HC) otic solution Place 4 drops into the right ear 4 (four) times daily. 10/04/16   Raeford Razor, MD  albuterol (PROVENTIL HFA;VENTOLIN HFA) 108 (90 Base) MCG/ACT inhaler Inhale 2 puffs into the lungs every 4 (four) hours as needed for wheezing or shortness of breath. 10/02/16   Shawn C Joy, PA-C  azithromycin (ZITHROMAX) 250 MG tablet Take 1 tablet (250 mg total) by mouth daily. Take first 2 tablets together, then 1 every day until finished. 10/10/16   Dione Booze, MD  benzonatate (TESSALON) 100 MG capsule Take  1 capsule (100 mg total) by mouth every 8 (eight) hours. 10/02/16   Shawn C Joy, PA-C  Cyanocobalamin (VITAMIN B-12 PO) Take 1 tablet by mouth daily.    Historical Provider, MD  ferrous sulfate 325 (65 FE) MG tablet Take 325 mg by mouth daily with breakfast.    Historical Provider, MD  glucose blood (ACCU-CHEK ACTIVE STRIPS) test strip Use as instructed 04/15/16   Barrett Henle, PA-C  HYDROcodone-acetaminophen (NORCO/VICODIN) 5-325 MG tablet Take 1 tablet by mouth every 4 (four) hours as needed. 04/15/16   Barrett Henle, PA-C  ibuprofen (ADVIL,MOTRIN) 600 MG tablet Take 1 tablet (600 mg total) by mouth every 6 (six) hours as needed. 04/15/16   Barrett Henle, PA-C  Multiple Vitamins-Minerals (HAIR/SKIN/NAILS) TABS  Take 1 tablet by mouth daily.    Historical Provider, MD  ondansetron (ZOFRAN) 4 MG tablet Take 1 tablet (4 mg total) by mouth every 8 (eight) hours as needed for nausea or vomiting. 08/11/16   Nicole Pisciotta, PA-C  pantoprazole (PROTONIX) 40 MG tablet Take 1 tablet (40 mg total) by mouth daily. 09/18/15   Azalia Bilis, MD  predniSONE (DELTASONE) 20 MG tablet Take 2 tablets (40 mg total) by mouth daily. 10/04/16   Raeford Razor, MD    Family History Family History  Problem Relation Age of Onset  . Heart disease Mother   . Diabetes Mother   . Asthma Mother   . Anemia Mother   . Colon cancer Maternal Uncle   . Heart disease Maternal Uncle   . Heart disease Maternal Grandmother   . Diabetes Maternal Grandmother   . Asthma Maternal Grandmother     Social History Social History  Substance Use Topics  . Smoking status: Never Smoker  . Smokeless tobacco: Never Used  . Alcohol use No     Allergies   Anesthetic ether [ether]; Nubain [nalbuphine hcl]; Penicillins; Advair diskus [fluticasone-salmeterol]; and Lactose intolerance (gi)   Review of Systems Review of Systems  Gastrointestinal: Positive for abdominal pain.  All other systems reviewed and are negative.    Physical Exam Updated Vital Signs BP 120/65   Pulse 68   Temp 98.3 F (36.8 C)   Resp 18   Ht 5' (1.524 m)   Wt 200 lb (90.7 kg)   SpO2 98%   BMI 39.06 kg/m   Physical Exam  Constitutional: She is oriented to person, place, and time.  Uncomfortable, anxious   HENT:  Head: Normocephalic.  Mouth/Throat: Oropharynx is clear and moist.  Eyes: EOM are normal. Pupils are equal, round, and reactive to light.  Neck: Normal range of motion. Neck supple.  Cardiovascular: Normal rate, regular rhythm and normal heart sounds.   Pulmonary/Chest: Effort normal and breath sounds normal. No respiratory distress. She has no wheezes. She has no rales.  Abdominal: Soft. Bowel sounds are normal.  + mild epigastric and RUQ  tenderness, no rebound. No R CVAT   Musculoskeletal: Normal range of motion.  Neurological: She is alert and oriented to person, place, and time. No cranial nerve deficit. Coordination normal.  Skin: Skin is warm.  Psychiatric: She has a normal mood and affect.  Nursing note and vitals reviewed.    ED Treatments / Results  Labs (all labs ordered are listed, but only abnormal results are displayed) Labs Reviewed  COMPREHENSIVE METABOLIC PANEL - Abnormal; Notable for the following:       Result Value   Glucose, Bld 122 (*)    Total Protein 6.0 (*)  AST 108 (*)    ALT 161 (*)    All other components within normal limits  CBC - Abnormal; Notable for the following:    WBC 14.4 (*)    Hemoglobin 15.1 (*)    All other components within normal limits  URINALYSIS, ROUTINE W REFLEX MICROSCOPIC - Abnormal; Notable for the following:    Hgb urine dipstick MODERATE (*)    Leukocytes, UA SMALL (*)    Bacteria, UA RARE (*)    Squamous Epithelial / LPF 0-5 (*)    All other components within normal limits  LIPASE, BLOOD  MONONUCLEOSIS SCREEN  HEPATITIS PANEL, ACUTE    EKG  EKG Interpretation None       Radiology Dg Chest 2 View  Result Date: 10/12/2016 CLINICAL DATA:  Follow-up visit for cough and congestion as well as epigastric pain. EXAM: CHEST  2 VIEW COMPARISON:  PA and lateral chest x-ray of October 10, 2016 FINDINGS: The lungs are mildly hypoinflated. There is no focal infiltrate. There is no pleural effusion. The heart and pulmonary vascularity are normal. The mediastinum is normal in width. The trachea is midline. The bony thorax exhibits no acute abnormality. IMPRESSION: Mild hypoinflation. No acute pneumonia nor other acute cardiopulmonary abnormality. Electronically Signed   By: Alcie Runions  [place] M.D.   On: 10/12/2016 17:42   US Abdomen Limited Ruq  Result Date: 10/12/2016 CLINICAL DATA:  Right upper quadrant abdominal pain starting in early December 2017. Hepatitis c. EXAM:  US ABDOMEN LIMITED - RIGHT UPPER QUADRANT COMPARISON:  07/30/2014 FINDINGS: Gallbladder: No gallstones or wall thickening visualized. No sonographic Murphy sign noted by sonographer. Common bile duct: Diameter: 4 mm Liver: Mildly echogenic liver without focal abnormality identified. IMPRESSION: 1. Mildly echogenic liver common not entirely specific but potentially from mild fatty infiltration. 2. No abnormality of the gallbladder or biliary dilatation identified. Electronically Signed   By: Gaylyn Rong M.D.   On: 10/12/2016 18:03    Procedures Procedures (including critical care time)  Medications Ordered in ED Medications  fentaNYL (SUBLIMAZE) injection 50 mcg (not administered)  sodium chloride 0.9 % bolus 1,000 mL (1,000 mLs Intravenous New Bag/Given 10/12/16 1711)  fentaNYL (SUBLIMAZE) injection 50 mcg (50 mcg Intravenous Given 10/12/16 1711)  ondansetron (ZOFRAN) injection 4 mg (4 mg Intravenous Given 10/12/16 1711)     Initial Impression / Assessment and Plan / ED Course  I have reviewed the triage vital signs and the nursing notes.  Pertinent labs & imaging results that were available during my care of the patient were reviewed by me and considered in my medical decision making (see chart for details).    Susan Hogan is a 56 y.o. female here with abdominal pain, subjective chills, cough. I think likely persistent bronchitis vs biliary colic vs mono. Will repeat labs, LFTs, lipase, monospot, will get CXR and RUQ [place]. Had at least 2 CT ab/pel in the last month that was unremarkable.   6:50 PM  US showed no biliary sludge or acute chole. Mild echogenic liver that is unchanged. LFTs low 100s, similar to previous, bilirubin normal. Mono neg. Added on acute hepatitis panel. Pain improved with pain meds. She has surgery follow up tomorrow. Will dc home with vicodin, zofran. She has already seen GI and had unremarkable endoscopy and colonoscopy. She can be worked up outpatient.   8:53  PM  After discharge, patient still had some pain. CT chest/ab/pel unremarkable, just incidental L adexnal cyst that can be followed up outpatient. Pain improved. Has surgery  follow up tomorrow.   Final Clinical Impressions(s) / ED Diagnoses   Final diagnoses:  RUQ pain    New Prescriptions New Prescriptions   No medications on file     Charlynne Pander, MD 10/12/16 1853    Charlynne Pander, MD 10/12/16 2054

## 2016-10-12 NOTE — Discharge Instructions (Signed)
Take vicodin for severe pain. DO NOT drive with it.   Take zofran for nausea.   Stay hydrated.   See your surgeon tomorrow and GI doctor regarding your elevated liver function tests  Return to ER if you have severe abdominal pain, vomiting, fevers.

## 2016-10-12 NOTE — ED Triage Notes (Signed)
Patient with c/o upper abdominal pain. Scheduled to see Dr. Magnus Ivan tomorrow for surgery consult - gall bladder

## 2016-10-13 ENCOUNTER — Ambulatory Visit (INDEPENDENT_AMBULATORY_CARE_PROVIDER_SITE_OTHER): Payer: Medicare Other | Admitting: Family Medicine

## 2016-10-13 VITALS — BP 98/60 | HR 71 | Temp 98.5°F | Resp 18 | Ht 60.0 in | Wt 202.6 lb

## 2016-10-13 DIAGNOSIS — N949 Unspecified condition associated with female genital organs and menstrual cycle: Secondary | ICD-10-CM | POA: Diagnosis not present

## 2016-10-13 DIAGNOSIS — M546 Pain in thoracic spine: Secondary | ICD-10-CM

## 2016-10-13 DIAGNOSIS — R1013 Epigastric pain: Secondary | ICD-10-CM | POA: Diagnosis not present

## 2016-10-13 DIAGNOSIS — K219 Gastro-esophageal reflux disease without esophagitis: Secondary | ICD-10-CM

## 2016-10-13 NOTE — Progress Notes (Signed)
Patient ID: Susan Hogan, female    DOB: [birth year], 56 y.o.   MRN: [id number]  PCP: No PCP Per Patient  Chief Complaint  Patient presents with  . Abdominal Pain    x 2 weeks  . Back Pain    Subjective:  HPI  56 year old patient presents for evaluation of abdominal pain and back pain. Patient has a past medical hx of diabetes, depression, hepatitis C, and IBS. Patient was seen and evaluated for abdominal pain on 10/12/16 at [facility] ED and was found to have elevated LFTs. Pt has been seen in the ED for several visits for acute abdominal pain and upper respiratory complaints.  She reports that a large cyst was found from imaging study during her ED visit on 10/12/16 and she was advised to follow-up with an OBGYN.  Reports that abdominal pain is mid epigastric and worst when lying down at night. Reports a burning sensation occasionally in the abdomen and upper mid chest. She occasionally has nausea in morning. She takes omeprazole 20 mg daily without relief of symptoms. Reports chronic mid back pain in which she reports relief with hydrocodone-acetaminophen. Denies injury and report experienced long-term back pain.  Social History   Social History  . Marital status: Single    Spouse name: N/A  . Number of children: 7  . Years of education: N/A   Occupational History  . Disabled    Social History Main Topics  . Smoking status: Never Smoker  . Smokeless tobacco: Never Used  . Alcohol use No  . Drug use: No  . Sexual activity: Not on file   Other Topics Concern  . Not on file   Social History Narrative  . No narrative on file    Family History  Problem Relation Age of Onset  . Heart disease Mother   . Diabetes Mother   . Asthma Mother   . Anemia Mother   . Colon cancer Maternal Uncle   . Heart disease Maternal Uncle   . Heart disease Maternal Grandmother   . Diabetes Maternal Grandmother   . Asthma Maternal Grandmother      Review of Systems  Patient  Active Problem List   Diagnosis Date Noted  . Syncope 10/05/2014  . Diabetes mellitus (HCC) 07/25/2014  . Fibromyalgia 07/25/2014  . MDD (major depressive disorder), recurrent severe, without psychosis (HCC) 03/17/2013  . HEPATITIS C 08/02/2007  . IRRITABLE BOWEL SYNDROME 08/02/2007  . DYSPNEA ON EXERTION 08/02/2007    Allergies  Allergen Reactions  . Anesthetic Ether [Ether] Shortness Of Breath    IV anesthesia causes extreme pain and tightness in back and ribcage.  . Nubain [Nalbuphine Hcl] Anaphylaxis  . Penicillins Hives and Shortness Of Breath  . Advair Diskus [Fluticasone-Salmeterol] Hives  . Lactose Intolerance (Gi) Diarrhea    Prior to Admission medications   Medication Sig Start Date End Date Taking? Authorizing Provider  azithromycin (ZITHROMAX) 250 MG tablet Take 1 tablet (250 mg total) by mouth daily. Take first 2 tablets together, then 1 every day until finished. 10/10/16  Yes Dione Booze, MD  butalbital-acetaminophen-caffeine (FIORICET WITH CODEINE) [phone] MG capsule Take 1 capsule by mouth every 6 (six) hours as needed for headache.   Yes Historical Provider, MD  dicyclomine (BENTYL) 20 MG tablet Take 20 mg by mouth 2 (two) times daily.   Yes Historical Provider, MD  ferrous sulfate 325 (65 FE) MG tablet Take 325 mg by mouth daily with breakfast.   Yes Historical Provider,  MD  glucose blood (ACCU-CHEK ACTIVE STRIPS) test strip Use as instructed 04/15/16  Yes Barrett Henle, PA-C  HYDROcodone-acetaminophen (NORCO/VICODIN) 5-325 MG tablet Take 1 tablet by mouth every 6 (six) hours as needed. 10/12/16  Yes Charlynne Pander, MD  ibuprofen (ADVIL,MOTRIN) 600 MG tablet Take 1 tablet (600 mg total) by mouth every 6 (six) hours as needed. 04/15/16  Yes Barrett Henle, PA-C  Multiple Vitamins-Minerals (HAIR/SKIN/NAILS) TABS Take 1 tablet by mouth daily.   Yes Historical Provider, MD  ondansetron (ZOFRAN) 4 MG tablet Take 1 tablet (4 mg total) by mouth every 8  (eight) hours as needed for nausea or vomiting. 08/11/16  Yes Nicole Pisciotta, PA-C  predniSONE (DELTASONE) 20 MG tablet Take 2 tablets (40 mg total) by mouth daily. 10/04/16  Yes Raeford Razor, MD  ondansetron (ZOFRAN ODT) 4 MG disintegrating tablet 4mg  ODT q4 hours prn nausea/vomit Patient not taking: Reported on 10/13/2016 10/12/16   10/14/16, MD    Past Medical, Surgical Family and Social History reviewed and updated.    Objective:   Today's Vitals   10/13/16 1424  BP: 98/60  Pulse: 71  Resp: 18  Temp: 98.5 F (36.9 C)  TempSrc: Oral  SpO2: 98%  Weight: 202 lb 9.6 oz (91.9 kg)  Height: 5' (1.524 m)    Wt Readings from Last 3 Encounters:  10/13/16 202 lb 9.6 oz (91.9 kg)  10/12/16 200 lb (90.7 kg)  10/10/16 200 lb (90.7 kg)   Physical Exam  Constitutional: She is oriented to person, place, and time. She appears well-developed and well-nourished.  HENT:  Head: Normocephalic and atraumatic.  Mouth/Throat: Oropharynx is clear and moist.  Eyes: Conjunctivae and EOM are normal. Pupils are equal, round, and reactive to light.  Neck: Normal range of motion. Neck supple.  Cardiovascular: Normal rate, regular rhythm, normal heart sounds and intact distal pulses.   Pulmonary/Chest: Effort normal and breath sounds normal.  Abdominal: Soft. Bowel sounds are normal. She exhibits no distension and no mass. There is no tenderness. There is no rebound and no guarding.  Musculoskeletal: Normal range of motion.  Lymphadenopathy:    She has no cervical adenopathy.  Neurological: She is alert and oriented to person, place, and time.  Skin: Skin is warm and dry.  Psychiatric: She has a normal mood and affect. Her behavior is normal. Judgment and thought content normal.      Assessment & Plan:  1. Adnexal cyst CTA of abdomen pelvis 10/12/16 findings included:  An incidental finding of potential clinical significance has been found. Left adnexal cystic lesion is present  measuring 2.5 cm. Given the patient's age, correlation with menstrual history is recommended, as it is recommended that a cyst of this size in a postmenopausal female be followed by Ob gyn with annual ultrasound to assure no malignant potential. Plan: - Ambulatory referral to Obstetrics / Gynecology  2. Gastroesophageal reflux disease without esophagitis Plan: Omeprazole 20 mg increase to 40 mg once daily.  3. Midline thoracic back pain, unspecified chronicity Plan: Continue previously prescribed  -Ibuprofen 800 mg every 6 hours with food -Hydrocodone - Acetaminophen 5-325 1 tablet, every 6 hours as needed for pain  Return for follow-up as needed   10/14/16. Godfrey Pick, MSN, FNP-C Primary Care at [facility] [phone]

## 2016-10-13 NOTE — Patient Instructions (Addendum)
Increase your omeprazole from 20 mg to 40 mg (this is medication you advised that you already have at home).  I have referred you to OB/GYN and they will contact you directly to schedule your appointment.  IF you received an x-ray today, you will receive an invoice from [facility]. Please contact [facility] at [phone] with questions or concerns regarding your invoice.   IF you received labwork today, you will receive an invoice from [facility]. Please contact [facility] at [phone] with questions or concerns regarding your invoice.   Our billing staff will not be able to assist you with questions regarding bills from these companies.  You will be contacted with the lab results as soon as they are available. The fastest way to get your results is to activate your My Chart account. Instructions are located on the last page of this paperwork. If you have not heard from [place] regarding the results in 2 weeks, please contact this office.

## 2016-10-14 ENCOUNTER — Other Ambulatory Visit (HOSPITAL_COMMUNITY): Payer: Self-pay | Admitting: Surgery

## 2016-10-14 ENCOUNTER — Other Ambulatory Visit: Payer: Self-pay

## 2016-10-14 DIAGNOSIS — R1013 Epigastric pain: Secondary | ICD-10-CM

## 2016-10-14 NOTE — Patient Outreach (Signed)
  [facility]) Care Management  10/14/2016  Susan Hogan 1960/10/10 [id number]   Telephone call to patient for screen.  Patient reports she does not have a primary care physician and she is trying to find one. She reports that she went to urgent care on yesterday and was told she had a cyst on her ovary and that she is supposed to see an OB/GYN.  Patient has a few physicians that she is going to try right now as she wants to get a primary physician to address her health care needs.  Patient declines [facility] Care Management at this time but agreeable to receiving a letter for future reference.    Plan: RN Health Coach will send letter and brochure to patient for future reference.  Will send care management assistant case closure status.    Bary Leriche, RN, MSN [facility] Care Management RN Telephonic Health Coach [phone]

## 2016-10-18 NOTE — ED Provider Notes (Signed)
[facility] DEPT Provider Note   CSN: [id number] Arrival date & time: 10/04/16  1925     History   Chief Complaint Chief Complaint  Patient presents with  . Cough    HPI Susan Hogan is a 56 y.o. female.  HPI   Pt has had cough, congestion, sneezing x 5 days.  Pt seen for the same and rx tessalon pearls.  Pt states her chest and back hurt as she cannot quit coughing.  Rates pain 10/10, sharp in nature and worse w/ cough.    Past Medical History:  Diagnosis Date  . Anemia   . Arthritis    RA "states does not see Rheumatologist"  . Asthma   . Carpal tunnel syndrome    bilaterally  . Complication of anesthesia    difficulty breathing  . Degenerative disk disease    back  . Depression   . Diabetes mellitus   . Fibromyalgia   . GERD (gastroesophageal reflux disease)   . Headache(784.0)    migraines  . Heart murmur   . Hepatitis    In followup treatment for hepatitis C  . Hypertension    no medication for at this time, sees Dr. Estevan Oaks Pcp  . Hypotensive episode    hx of  . Irritable bowel syndrome   . Kidney stone    with hematuria  . Kidney stones    with hematuria  . Recurrent upper respiratory infection (URI)   . Shortness of breath   . Syncope     Patient Active Problem List   Diagnosis Date Noted  . Syncope 10/05/2014  . Diabetes mellitus (HCC) 07/25/2014  . Fibromyalgia 07/25/2014  . MDD (major depressive disorder), recurrent severe, without psychosis (HCC) 03/17/2013  . HEPATITIS C 08/02/2007  . IRRITABLE BOWEL SYNDROME 08/02/2007  . DYSPNEA ON EXERTION 08/02/2007    Past Surgical History:  Procedure Laterality Date  . DILATION AND CURETTAGE OF UTERUS    . LIVER BIOPSY    . TONSILLECTOMY    . TUBAL LIGATION      OB History    No data available       Home Medications    Prior to Admission medications   Medication Sig Start Date End Date Taking? Authorizing Provider  azithromycin (ZITHROMAX) 250 MG tablet Take 1  tablet (250 mg total) by mouth daily. Take first 2 tablets together, then 1 every day until finished. 10/10/16   Dione Booze, MD  butalbital-acetaminophen-caffeine (FIORICET WITH CODEINE) [phone] MG capsule Take 1 capsule by mouth every 6 (six) hours as needed for headache.    Historical Provider, MD  dicyclomine (BENTYL) 20 MG tablet Take 20 mg by mouth 2 (two) times daily.    Historical Provider, MD  ferrous sulfate 325 (65 FE) MG tablet Take 325 mg by mouth daily with breakfast.    Historical Provider, MD  glucose blood (ACCU-CHEK ACTIVE STRIPS) test strip Use as instructed 04/15/16   Barrett Henle, PA-C  HYDROcodone-acetaminophen (NORCO/VICODIN) 5-325 MG tablet Take 1 tablet by mouth every 6 (six) hours as needed. 10/12/16   Charlynne Pander, MD  ibuprofen (ADVIL,MOTRIN) 600 MG tablet Take 1 tablet (600 mg total) by mouth every 6 (six) hours as needed. 04/15/16   Barrett Henle, PA-C  Multiple Vitamins-Minerals (HAIR/SKIN/NAILS) TABS Take 1 tablet by mouth daily.    Historical Provider, MD  ondansetron (ZOFRAN ODT) 4 MG disintegrating tablet 4mg  ODT q4 hours prn nausea/vomit Patient not taking: Reported on 10/13/2016 10/12/16  Charlynne Pander, MD  ondansetron (ZOFRAN) 4 MG tablet Take 1 tablet (4 mg total) by mouth every 8 (eight) hours as needed for nausea or vomiting. 08/11/16   Joni Reining Pisciotta, PA-C    Family History Family History  Problem Relation Age of Onset  . Heart disease Mother   . Diabetes Mother   . Asthma Mother   . Anemia Mother   . Colon cancer Maternal Uncle   . Heart disease Maternal Uncle   . Heart disease Maternal Grandmother   . Diabetes Maternal Grandmother   . Asthma Maternal Grandmother     Social History Social History  Substance Use Topics  . Smoking status: Never Smoker  . Smokeless tobacco: Never Used  . Alcohol use No     Allergies   Anesthetic ether [ether]; Nubain [nalbuphine hcl]; Penicillins; Advair diskus  [fluticasone-salmeterol]; and Lactose intolerance (gi)   Review of Systems Review of Systems  All systems reviewed and negative, other than as noted in HPI.  Physical Exam Updated Vital Signs BP 130/80   Pulse (!) 56   Temp 98.2 F (36.8 C) (Oral)   Resp 18   Ht 5' (1.524 m)   Wt 207 lb 3.2 oz (94 kg)   SpO2 91%   BMI 40.47 kg/m   Physical Exam  Constitutional: She appears well-developed and well-nourished. No distress.  HENT:  Head: Normocephalic and atraumatic.  Eyes: Conjunctivae are normal. Right eye exhibits no discharge. Left eye exhibits no discharge.  Neck: Neck supple.  Cardiovascular: Normal rate, regular rhythm and normal heart sounds.  Exam reveals no gallop and no friction rub.   No murmur heard. Pulmonary/Chest: Effort normal and breath sounds normal. No respiratory distress.  Abdominal: Soft. She exhibits no distension. There is no tenderness.  Musculoskeletal: She exhibits no edema or tenderness.  Lower extremities symmetric as compared to each other. No calf tenderness. Negative Homan's. No palpable cords.   Neurological: She is alert.  Skin: Skin is warm and dry.  Psychiatric: She has a normal mood and affect. Her behavior is normal. Thought content normal.  Nursing note and vitals reviewed.    ED Treatments / Results  Labs (all labs ordered are listed, but only abnormal results are displayed) Labs Reviewed - No data to display  EKG  EKG Interpretation  Date/Time:  Sunday October 04 2016 20:10:18 EST Ventricular Rate:  74 PR Interval:    QRS Duration: 93 QT Interval:  375 QTC Calculation: 416 R Axis:   47 Text Interpretation:  Sinus rhythm since last tracing no significant change Confirmed by WENTZ  MD, ELLIOTT ([id number]) on 10/04/2016 11:14:53 PM       Radiology No results found.  Procedures Procedures (including critical care time)  Medications Ordered in ED Medications  dexamethasone (DECADRON) tablet 12 mg (12 mg Oral Given  10/04/16 2353)  ketorolac (TORADOL) 15 MG/ML injection 15 mg (15 mg Intramuscular Given 10/04/16 2353)     Initial Impression / Assessment and Plan / ED Course  I have reviewed the triage vital signs and the nursing notes.  Pertinent labs & imaging results that were available during my care of the patient were reviewed by me and considered in my medical decision making (see chart for details).     55 yF with likely viral bronchitis. Will add steroids. Doubt SBI, PE or other emergent process.   Final Clinical Impressions(s) / ED Diagnoses   Final diagnoses:  Bronchitis    New Prescriptions Discharge Medication List as of 10/04/2016  11:24 PM    START taking these medications   Details  acetic acid-hydrocortisone (VOSOL-HC) otic solution Place 4 drops into the right ear 4 (four) times daily., Starting Sun 10/04/2016, Print    predniSONE (DELTASONE) 20 MG tablet Take 2 tablets (40 mg total) by mouth daily., Starting Sun 10/04/2016, Print         Raeford Razor, MD 10/18/16 [phone]

## 2016-10-21 ENCOUNTER — Encounter (HOSPITAL_COMMUNITY): Admission: RE | Admit: 2016-10-21 | Payer: Medicare Other | Source: Ambulatory Visit

## 2016-10-22 ENCOUNTER — Encounter: Payer: Self-pay | Admitting: Family Medicine

## 2016-10-22 ENCOUNTER — Ambulatory Visit (INDEPENDENT_AMBULATORY_CARE_PROVIDER_SITE_OTHER): Payer: Medicare Other | Admitting: Family Medicine

## 2016-10-22 VITALS — BP 120/90 | HR 67 | Resp 12 | Ht 60.0 in | Wt 202.2 lb

## 2016-10-22 DIAGNOSIS — J4489 Other specified chronic obstructive pulmonary disease: Secondary | ICD-10-CM | POA: Insufficient documentation

## 2016-10-22 DIAGNOSIS — M797 Fibromyalgia: Secondary | ICD-10-CM | POA: Diagnosis not present

## 2016-10-22 DIAGNOSIS — R1013 Epigastric pain: Secondary | ICD-10-CM

## 2016-10-22 DIAGNOSIS — J42 Unspecified chronic bronchitis: Secondary | ICD-10-CM | POA: Diagnosis not present

## 2016-10-22 DIAGNOSIS — E119 Type 2 diabetes mellitus without complications: Secondary | ICD-10-CM

## 2016-10-22 DIAGNOSIS — J449 Chronic obstructive pulmonary disease, unspecified: Secondary | ICD-10-CM | POA: Insufficient documentation

## 2016-10-22 LAB — MICROALBUMIN / CREATININE URINE RATIO
Creatinine,U: 147.4 mg/dL
Microalb Creat Ratio: 0.8 mg/g (ref 0.0–30.0)
Microalb, Ur: 1.2 mg/dL (ref 0.0–1.9)

## 2016-10-22 LAB — LIPID PANEL
CHOL/HDL RATIO: 3
Cholesterol: 149 mg/dL (ref 0–200)
HDL: 49.5 mg/dL (ref >39.00)
LDL CALC: 74 mg/dL (ref 0–99)
NONHDL: 99.68
Triglycerides: 127 mg/dL (ref 0.0–149.0)
VLDL: 25.4 mg/dL (ref 0.0–40.0)

## 2016-10-22 LAB — HEMOGLOBIN A1C: HEMOGLOBIN A1C: 5.6 % (ref 4.6–6.5)

## 2016-10-22 MED ORDER — ALBUTEROL SULFATE HFA 108 (90 BASE) MCG/ACT IN AERS: 2.0000 | INHALATION_SPRAY | Freq: Four times a day (QID) | RESPIRATORY_TRACT | 1 refills | Status: DC | PRN

## 2016-10-22 NOTE — Progress Notes (Signed)
Pre visit review using our clinic review tool, if applicable. No additional management support is needed unless otherwise documented below in the visit note. 

## 2016-10-22 NOTE — Progress Notes (Signed)
HPI:   Susan Hogan is a 56 y.o. female, who is here today to establish care with me.  Former PCP: Dr Susan Hogan, acute care. Last preventive routine visit: "it has been a while"  Chronic medical problems: Major depression disorder, hepatitis C, IBS,DM II, and fibromyalgia among some.    Concerns today: Abdominal pain  She has been having upper abdominal pain, feels like she has a "lumb" in the middle of her chest, has not palpated any mass. She denies dysphagia. Moderate to severe burning sensation "moving around" her upper abdomen  She has had this pain for "long time", since 07/2016. She has been in ED a few times with same complaint.  She has had work-up done, missed appt for HIDA scan, has not re-scheduled.  Chest and abdominal CTA 10/12/16: Negative otherwise, incidental finding left adnexal cyst 2.5 cm.According to pt, referral to gyn has been placed, awaiting for appt information.  10/12/16 abdominal u/s: 1. There is increased echotexture of the liver consistent with fatty infiltration. The gallbladder and common bile duct and visualized portions of the pancreas are normal. 2. The spleen and kidneys exhibit no acute abnormalities.   She had abdominal u/s 07/2014 because upper abdominal pain and nausea. Also in 2013 Abdominal/pelvic CT 11/2008 because left flank pain: Negative.   Decreased appetite and nausea. She had vomiting when pain just started. Pain is exacerbated with food intake. She has not identified alleviating factors. She denies heartburn. Burning sensation, intermittently, sometimes she has back pain before she has abdominal pain, she is not sure if they both related.   She is following with GI, had EGD and colonoscopy 09/04/16. She reports having "esophagus stretched".  She is currently taking Omeprazole 40 mg and Pantoprazole 20 mg.  09/2014 Cardiolite Lexican stress test was done, negative   Diabetes Mellitus II:    Currently on non  pharmacologic treatment. Metformin discontinued because GI side effects. . Problem has been stable. Checking BS's : No  She would like FLP done today. She denies abnormal wt loss,polydipsia, polyuria, or polyphagia. No numbness, tingling, or burning.   Lab Results  Component Value Date   CREATININE 0.70 10/12/2016   BUN 17 10/12/2016   NA 140 10/12/2016   K 4.1 10/12/2016   CL 103 10/12/2016   CO2 27 10/12/2016   Hx of hepatitis C, she states that she has never be treated.  Lab Results  Component Value Date   ALT 161 (H) 10/12/2016   AST 108 (H) 10/12/2016   ALKPHOS 68 10/12/2016   BILITOT 0.6 10/12/2016   Stopped metformin because GI sx. Hx of IBS-C, she tells me that this has resolved.  + Arthralgias and back pain, she has had both for years. No edema or erythema. She takes Advil, states that she can not take Ibuprofen because her liver.   She lives with her 2 teenager children (58 and 26 yo). She does not exercise regularly and has not followed a healthy diet consistently.  She is also reporting Hx of chronic bronchitis, she needs a refill on Albuterol inh. + Intermittent wheezing. No Hx of tobacco use.    Review of Systems  Constitutional: Positive for fatigue. Negative for activity change, appetite change, fever and unexpected weight change.  HENT: Negative for mouth sores, nosebleeds and trouble swallowing.   Eyes: Negative for redness and visual disturbance.  Respiratory: Negative for cough, shortness of breath and wheezing.   Cardiovascular: Negative for chest pain, palpitations and leg swelling.  Gastrointestinal: Positive for abdominal pain and nausea. Negative for blood in stool and vomiting.       Negative for changes in bowel habits.  Endocrine: Negative for polydipsia, polyphagia and polyuria.  Genitourinary: Negative for decreased urine volume, difficulty urinating, dysuria and hematuria.  Musculoskeletal: Positive for arthralgias and back pain.  Negative for gait problem.  Skin: Negative for rash.  Neurological: Negative for syncope and weakness.  Psychiatric/Behavioral: Negative for confusion. The patient is nervous/anxious.       Current Outpatient Prescriptions on File Prior to Visit  Medication Sig Dispense Refill  . butalbital-acetaminophen-caffeine (FIORICET WITH CODEINE) 50-325-40-30 MG capsule Take 1 capsule by mouth every 6 (six) hours as needed for headache.    . dicyclomine (BENTYL) 20 MG tablet Take 20 mg by mouth 2 (two) times daily.    . ferrous sulfate 325 (65 FE) MG tablet Take 325 mg by mouth daily with breakfast.    . glucose blood (ACCU-CHEK ACTIVE STRIPS) test strip Use as instructed 100 each 12  . Multiple Vitamins-Minerals (HAIR/SKIN/NAILS) TABS Take 1 tablet by mouth daily.    . ondansetron (ZOFRAN) 4 MG tablet Take 1 tablet (4 mg total) by mouth every 8 (eight) hours as needed for nausea or vomiting. 10 tablet 0   No current facility-administered medications on file prior to visit.      Past Medical History:  Diagnosis Date  . Anemia   . Arthritis    RA "states does not see Rheumatologist"  . Asthma   . Carpal tunnel syndrome    bilaterally  . Complication of anesthesia    difficulty breathing  . Degenerative disk disease    back  . Depression   . Diabetes mellitus   . Fibromyalgia   . GERD (gastroesophageal reflux disease)   . Headache(784.0)    migraines  . Heart murmur   . Hepatitis    In followup treatment for hepatitis C  . Hypertension    no medication for at this time, sees Dr. Estevan Oaks Pcp  . Hypotensive episode    hx of  . Irritable bowel syndrome   . Kidney stone    with hematuria  . Kidney stones    with hematuria  . Recurrent upper respiratory infection (URI)   . Shortness of breath   . Syncope    Allergies  Allergen Reactions  . Anesthetic Ether [Ether] Shortness Of Breath    IV anesthesia causes extreme pain and tightness in back and ribcage.  . Nubain  [Nalbuphine Hcl] Anaphylaxis  . Penicillins Hives and Shortness Of Breath  . Advair Diskus [Fluticasone-Salmeterol] Hives  . Lactose Intolerance (Gi) Diarrhea    Family History  Problem Relation Age of Onset  . Heart disease Mother   . Diabetes Mother   . Asthma Mother   . Anemia Mother   . Colon cancer Maternal Uncle   . Heart disease Maternal Uncle   . Heart disease Maternal Grandmother   . Diabetes Maternal Grandmother   . Asthma Maternal Grandmother     Social History   Social History  . Marital status: Single    Spouse name: N/A  . Number of children: 7  . Years of education: N/A   Occupational History  . Disabled    Social History Main Topics  . Smoking status: Never Smoker  . Smokeless tobacco: Never Used  . Alcohol use No  . Drug use: No  . Sexual activity: Not Asked   Other Topics Concern  .  None   Social History Narrative  . None    Vitals:   10/22/16 1354  BP: 120/90  Pulse: 67  Resp: 12   O2 sat 98% at RA.  Body mass index is 39.5 kg/m.   Physical Exam  Nursing note and vitals reviewed. Constitutional: She is oriented to person, place, and time. She appears well-developed. No distress.  HENT:  Head: Atraumatic.  Mouth/Throat: Oropharynx is clear and moist and mucous membranes are normal.  Eyes: Conjunctivae and EOM are normal. Pupils are equal, round, and reactive to light.  Cardiovascular: Normal rate and regular rhythm.   No murmur heard. Pulses:      Dorsalis pedis pulses are 2+ on the right side, and 2+ on the left side.  Respiratory: Effort normal and breath sounds normal. No respiratory distress. She has no rhonchi. She has no rales.  Prolonged expiration.  GI: Soft. She exhibits no mass. There is no hepatomegaly. There is no tenderness.  Musculoskeletal: She exhibits no edema.  Pain upon palpation lower thoracic and lumbar bilateral.  Left chest wall tenderness upon palpation.  Lymphadenopathy:    She has no cervical  adenopathy.  Neurological: She is alert and oriented to person, place, and time. She has normal strength. Coordination and gait normal.  Skin: Skin is warm. No erythema.  Psychiatric: Her mood appears anxious.  Well groomed, good eye contact.    Diabetic foot exam:  Monofilament normal bilateral. Peripheral pulses present (DP). No calluses No hypertrophic/long toenails.   ASSESSMENT AND PLAN:     Susan Hogan was seen today for establish care.  Diagnoses and all orders for this visit:  Lab Results  Component Value Date   HGBA1C 5.6 10/22/2016   Lab Results  Component Value Date   CHOL 149 10/22/2016   HDL 49.50 10/22/2016   LDLCALC 74 10/22/2016   TRIG 127.0 10/22/2016   CHOLHDL 3 10/22/2016     Abdominal pain, epigastric  Possible causes discussed: Dyspepsia,GERD,IBS, gastroparesis, gall bladder disease among some. 10/12/16 Lipase, troponin,mono, and CMP were done.  She is currently following with GI, instructed to re-schedule HIDA scan and follow as recommended. She needs to stop one the 2 PPI's she is taking, will continue with Omeprazole.  GERD precautions discussed.   Type 2 diabetes mellitus without complication, without long-term current use of insulin (HCC)  Continue non pharmacologic treatment. We will follow labs done today and will give further recommendations accordingly. Dental,foot, and eye examination at least annually discussed and recommended. F/U in 4-6 months.  -     Hemoglobin A1c -     Microalbumin / creatinine urine ratio -     Lipid panel  Chronic bronchitis, unspecified chronic bronchitis type (HCC)  Hx of Asthma. Albuterol inh to continue qid as needed. Instructed about warning signs.  -     albuterol (PROVENTIL HFA;VENTOLIN HFA) 108 (90 Base) MCG/ACT inhaler; Inhale 2 puffs into the lungs every 6 (six) hours as needed for wheezing or shortness of breath.  Fibromyalgia  We discussed symptoms and treatment options. Cymbalta,  gabapentin, Lyrica, or Amitriptyline can be considered. I prefers to hold on new medication for now, will wait until GI work-up is completed. Low impact exercise. Caution with Advil and NSAID's in general.She could take Acetaminophen up to 2000 mg daily.  I will see her back in 2 months.     Betty G. [place], MD  [facility]. [place] office.

## 2016-10-22 NOTE — Patient Instructions (Addendum)
A few things to remember from today's visit:   Type 2 diabetes mellitus without complication, without long-term current use of insulin (HCC) - Plan: Hemoglobin A1c, Microalbumin / creatinine urine ratio, Lipid panel  Chronic bronchitis, unspecified chronic bronchitis type (HCC) - Plan: albuterol (PROVENTIL HFA;VENTOLIN HFA) 108 (90 Base) MCG/ACT inhaler  Fibromyalgia    Avoid foods that make your symptoms worse, for example coffee, chocolate,pepermeint,alcohol, and greasy food. Raising the head of your bed about 6 inches may help with nocturnal symptoms.  Avoid tobacco use. Weight loss (if you are overweight). Avoid lying down for 3 hours after eating.  Instead 3 large meals daily try small and more frequent meals during the day.  Continue following with gastro and taking Omeprazole.    You should be evaluated immediately if bloody vomiting, bloody stools, black stools (like tar), difficulty swallowing, food gets stuck on the way down or choking when eating. Abnormal weight loss or severe abdominal pain.  If symptoms are not resolved sometimes endoscopy is necessary.   Please be sure medication list is accurate. If a new problem present, please set up appointment sooner than planned today.

## 2016-10-27 ENCOUNTER — Telehealth: Payer: Self-pay | Admitting: Family Medicine

## 2016-10-27 DIAGNOSIS — R112 Nausea with vomiting, unspecified: Secondary | ICD-10-CM | POA: Diagnosis not present

## 2016-10-27 DIAGNOSIS — E1165 Type 2 diabetes mellitus with hyperglycemia: Secondary | ICD-10-CM | POA: Diagnosis not present

## 2016-10-27 DIAGNOSIS — J45909 Unspecified asthma, uncomplicated: Secondary | ICD-10-CM | POA: Diagnosis not present

## 2016-10-27 DIAGNOSIS — Z780 Asymptomatic menopausal state: Secondary | ICD-10-CM | POA: Diagnosis not present

## 2016-10-27 DIAGNOSIS — M797 Fibromyalgia: Secondary | ICD-10-CM | POA: Diagnosis not present

## 2016-10-27 DIAGNOSIS — R51 Headache: Secondary | ICD-10-CM | POA: Diagnosis not present

## 2016-10-27 DIAGNOSIS — M549 Dorsalgia, unspecified: Secondary | ICD-10-CM | POA: Diagnosis not present

## 2016-10-27 DIAGNOSIS — R1013 Epigastric pain: Secondary | ICD-10-CM | POA: Diagnosis not present

## 2016-10-27 DIAGNOSIS — Z880 Allergy status to penicillin: Secondary | ICD-10-CM | POA: Diagnosis not present

## 2016-10-27 NOTE — Telephone Encounter (Signed)
error 

## 2016-10-28 ENCOUNTER — Ambulatory Visit: Payer: Self-pay | Admitting: Family Medicine

## 2016-10-28 DIAGNOSIS — R51 Headache: Secondary | ICD-10-CM | POA: Diagnosis not present

## 2016-11-03 DIAGNOSIS — M1712 Unilateral primary osteoarthritis, left knee: Secondary | ICD-10-CM | POA: Diagnosis not present

## 2016-11-03 DIAGNOSIS — S83242A Other tear of medial meniscus, current injury, left knee, initial encounter: Secondary | ICD-10-CM | POA: Diagnosis not present

## 2016-11-11 DIAGNOSIS — E119 Type 2 diabetes mellitus without complications: Secondary | ICD-10-CM | POA: Diagnosis not present

## 2016-11-11 DIAGNOSIS — H40013 Open angle with borderline findings, low risk, bilateral: Secondary | ICD-10-CM | POA: Diagnosis not present

## 2016-11-16 ENCOUNTER — Ambulatory Visit: Payer: Self-pay | Admitting: Family

## 2016-11-16 DIAGNOSIS — Z880 Allergy status to penicillin: Secondary | ICD-10-CM | POA: Diagnosis not present

## 2016-11-16 DIAGNOSIS — R51 Headache: Secondary | ICD-10-CM | POA: Diagnosis not present

## 2016-11-16 DIAGNOSIS — M797 Fibromyalgia: Secondary | ICD-10-CM | POA: Diagnosis not present

## 2016-11-16 DIAGNOSIS — R1011 Right upper quadrant pain: Secondary | ICD-10-CM | POA: Diagnosis not present

## 2016-11-16 DIAGNOSIS — R112 Nausea with vomiting, unspecified: Secondary | ICD-10-CM | POA: Diagnosis not present

## 2016-11-16 DIAGNOSIS — J45909 Unspecified asthma, uncomplicated: Secondary | ICD-10-CM | POA: Diagnosis not present

## 2016-11-16 DIAGNOSIS — E119 Type 2 diabetes mellitus without complications: Secondary | ICD-10-CM | POA: Diagnosis not present

## 2016-11-24 DIAGNOSIS — R1011 Right upper quadrant pain: Secondary | ICD-10-CM | POA: Diagnosis not present

## 2016-11-24 DIAGNOSIS — R3129 Other microscopic hematuria: Secondary | ICD-10-CM | POA: Diagnosis not present

## 2016-11-24 DIAGNOSIS — R7301 Impaired fasting glucose: Secondary | ICD-10-CM | POA: Insufficient documentation

## 2016-11-24 DIAGNOSIS — E119 Type 2 diabetes mellitus without complications: Secondary | ICD-10-CM | POA: Diagnosis not present

## 2016-11-24 DIAGNOSIS — K760 Fatty (change of) liver, not elsewhere classified: Secondary | ICD-10-CM | POA: Insufficient documentation

## 2016-11-24 DIAGNOSIS — B182 Chronic viral hepatitis C: Secondary | ICD-10-CM | POA: Diagnosis not present

## 2016-11-24 DIAGNOSIS — Z8739 Personal history of other diseases of the musculoskeletal system and connective tissue: Secondary | ICD-10-CM | POA: Insufficient documentation

## 2016-11-24 DIAGNOSIS — S83207A Unspecified tear of unspecified meniscus, current injury, left knee, initial encounter: Secondary | ICD-10-CM | POA: Insufficient documentation

## 2016-11-24 DIAGNOSIS — Z13220 Encounter for screening for lipoid disorders: Secondary | ICD-10-CM | POA: Diagnosis not present

## 2016-12-03 DIAGNOSIS — J449 Chronic obstructive pulmonary disease, unspecified: Secondary | ICD-10-CM | POA: Diagnosis not present

## 2016-12-03 DIAGNOSIS — F332 Major depressive disorder, recurrent severe without psychotic features: Secondary | ICD-10-CM | POA: Diagnosis not present

## 2016-12-03 DIAGNOSIS — Z23 Encounter for immunization: Secondary | ICD-10-CM | POA: Diagnosis not present

## 2016-12-03 DIAGNOSIS — Z124 Encounter for screening for malignant neoplasm of cervix: Secondary | ICD-10-CM | POA: Diagnosis not present

## 2016-12-03 DIAGNOSIS — Z01419 Encounter for gynecological examination (general) (routine) without abnormal findings: Secondary | ICD-10-CM | POA: Diagnosis not present

## 2016-12-03 DIAGNOSIS — Z87828 Personal history of other (healed) physical injury and trauma: Secondary | ICD-10-CM | POA: Diagnosis not present

## 2016-12-03 DIAGNOSIS — K828 Other specified diseases of gallbladder: Secondary | ICD-10-CM | POA: Diagnosis not present

## 2016-12-03 DIAGNOSIS — Z1231 Encounter for screening mammogram for malignant neoplasm of breast: Secondary | ICD-10-CM | POA: Diagnosis not present

## 2016-12-03 DIAGNOSIS — Z01818 Encounter for other preprocedural examination: Secondary | ICD-10-CM | POA: Diagnosis not present

## 2016-12-03 DIAGNOSIS — R140 Abdominal distension (gaseous): Secondary | ICD-10-CM | POA: Diagnosis not present

## 2016-12-09 DIAGNOSIS — S83282A Other tear of lateral meniscus, current injury, left knee, initial encounter: Secondary | ICD-10-CM | POA: Diagnosis not present

## 2016-12-09 DIAGNOSIS — S83262A Peripheral tear of lateral meniscus, current injury, left knee, initial encounter: Secondary | ICD-10-CM | POA: Diagnosis not present

## 2016-12-09 DIAGNOSIS — M1712 Unilateral primary osteoarthritis, left knee: Secondary | ICD-10-CM | POA: Diagnosis not present

## 2016-12-09 DIAGNOSIS — S83242A Other tear of medial meniscus, current injury, left knee, initial encounter: Secondary | ICD-10-CM | POA: Diagnosis not present

## 2016-12-09 DIAGNOSIS — G8918 Other acute postprocedural pain: Secondary | ICD-10-CM | POA: Diagnosis not present

## 2016-12-09 DIAGNOSIS — Y999 Unspecified external cause status: Secondary | ICD-10-CM | POA: Diagnosis not present

## 2016-12-09 DIAGNOSIS — M94262 Chondromalacia, left knee: Secondary | ICD-10-CM | POA: Diagnosis not present

## 2016-12-09 DIAGNOSIS — S83232A Complex tear of medial meniscus, current injury, left knee, initial encounter: Secondary | ICD-10-CM | POA: Diagnosis not present

## 2016-12-09 DIAGNOSIS — M2242 Chondromalacia patellae, left knee: Secondary | ICD-10-CM | POA: Diagnosis not present

## 2016-12-15 DIAGNOSIS — R112 Nausea with vomiting, unspecified: Secondary | ICD-10-CM | POA: Diagnosis not present

## 2016-12-15 DIAGNOSIS — K838 Other specified diseases of biliary tract: Secondary | ICD-10-CM | POA: Diagnosis not present

## 2016-12-15 DIAGNOSIS — Z880 Allergy status to penicillin: Secondary | ICD-10-CM | POA: Diagnosis not present

## 2016-12-15 DIAGNOSIS — R1011 Right upper quadrant pain: Secondary | ICD-10-CM | POA: Diagnosis not present

## 2016-12-15 DIAGNOSIS — M549 Dorsalgia, unspecified: Secondary | ICD-10-CM | POA: Diagnosis not present

## 2016-12-15 DIAGNOSIS — M797 Fibromyalgia: Secondary | ICD-10-CM | POA: Diagnosis not present

## 2016-12-15 DIAGNOSIS — Z780 Asymptomatic menopausal state: Secondary | ICD-10-CM | POA: Diagnosis not present

## 2016-12-15 DIAGNOSIS — E119 Type 2 diabetes mellitus without complications: Secondary | ICD-10-CM | POA: Diagnosis not present

## 2016-12-15 DIAGNOSIS — J45909 Unspecified asthma, uncomplicated: Secondary | ICD-10-CM | POA: Diagnosis not present

## 2016-12-15 DIAGNOSIS — R6883 Chills (without fever): Secondary | ICD-10-CM | POA: Diagnosis not present

## 2016-12-15 DIAGNOSIS — K8050 Calculus of bile duct without cholangitis or cholecystitis without obstruction: Secondary | ICD-10-CM | POA: Diagnosis not present

## 2016-12-17 DIAGNOSIS — J069 Acute upper respiratory infection, unspecified: Secondary | ICD-10-CM | POA: Diagnosis not present

## 2016-12-17 DIAGNOSIS — Z1231 Encounter for screening mammogram for malignant neoplasm of breast: Secondary | ICD-10-CM | POA: Diagnosis not present

## 2016-12-17 DIAGNOSIS — J029 Acute pharyngitis, unspecified: Secondary | ICD-10-CM | POA: Diagnosis not present

## 2016-12-17 DIAGNOSIS — J309 Allergic rhinitis, unspecified: Secondary | ICD-10-CM | POA: Diagnosis not present

## 2016-12-17 DIAGNOSIS — Z4789 Encounter for other orthopedic aftercare: Secondary | ICD-10-CM | POA: Diagnosis not present

## 2016-12-17 DIAGNOSIS — S83242D Other tear of medial meniscus, current injury, left knee, subsequent encounter: Secondary | ICD-10-CM | POA: Diagnosis not present

## 2016-12-22 DIAGNOSIS — R109 Unspecified abdominal pain: Secondary | ICD-10-CM | POA: Diagnosis not present

## 2016-12-30 ENCOUNTER — Ambulatory Visit: Payer: No Typology Code available for payment source | Attending: Orthopedic Surgery | Admitting: Physical Therapy

## 2016-12-30 DIAGNOSIS — M25562 Pain in left knee: Secondary | ICD-10-CM | POA: Insufficient documentation

## 2016-12-30 DIAGNOSIS — R262 Difficulty in walking, not elsewhere classified: Secondary | ICD-10-CM | POA: Insufficient documentation

## 2016-12-30 DIAGNOSIS — M6281 Muscle weakness (generalized): Secondary | ICD-10-CM | POA: Insufficient documentation

## 2016-12-31 ENCOUNTER — Ambulatory Visit: Payer: No Typology Code available for payment source

## 2017-01-01 ENCOUNTER — Ambulatory Visit: Payer: No Typology Code available for payment source | Admitting: Physical Therapy

## 2017-01-01 ENCOUNTER — Encounter: Payer: Self-pay | Admitting: Physical Therapy

## 2017-01-01 DIAGNOSIS — M25562 Pain in left knee: Secondary | ICD-10-CM

## 2017-01-01 DIAGNOSIS — R262 Difficulty in walking, not elsewhere classified: Secondary | ICD-10-CM

## 2017-01-01 DIAGNOSIS — M6281 Muscle weakness (generalized): Secondary | ICD-10-CM | POA: Diagnosis present

## 2017-01-01 NOTE — Therapy (Signed)
[facility] [address] [place], [place], [address] Phone: [phone]   Fax:  [phone]  Physical Therapy Evaluation  Patient Details  Name: Susan Hogan MRN: [id number] Date of Birth: 1960-11-21 Referring Provider: Beverely Low, MD  Encounter Date: 01/01/2017      PT End of Session - 01/01/17 1129    Visit Number 1   Number of Visits 9   Date for PT Re-Evaluation 01/29/17   Authorization Type MCR- KX at visiti 15   PT Start Time 1110   PT Stop Time 1152   PT Time Calculation (min) 42 min   Activity Tolerance Patient tolerated treatment well   Behavior During Therapy [facility] for tasks assessed/performed      Past Medical History:  Diagnosis Date  . Anemia   . Arthritis    RA "states does not see Rheumatologist"  . Asthma   . Carpal tunnel syndrome    bilaterally  . Complication of anesthesia    difficulty breathing  . Degenerative disk disease    back  . Depression   . Diabetes mellitus   . Fibromyalgia   . GERD (gastroesophageal reflux disease)   . Headache(784.0)    migraines  . Heart murmur   . Hepatitis    In followup treatment for hepatitis C  . Hypertension    no medication for at this time, sees Dr. Estevan Oaks Pcp  . Hypotensive episode    hx of  . Irritable bowel syndrome   . Kidney stone    with hematuria  . Kidney stones    with hematuria  . Recurrent upper respiratory infection (URI)   . Shortness of breath   . Syncope     Past Surgical History:  Procedure Laterality Date  . DILATION AND CURETTAGE OF UTERUS    . LIVER BIOPSY    . TONSILLECTOMY    . TUBAL LIGATION      There were no vitals filed for this visit.       Subjective Assessment - 01/01/17 1117    Subjective Has been sleeping with pillow under knee but is having a hard time sleeping due to pain. REports numbness on L anterior thigh and random electrical shocks. Overall knee is feeling okay. Uses rollator but  tries not to at home until she is tired. Has not experienced knee giving out.    How long can you sit comfortably? 30 min   How long can you walk comfortably? 15 min   Patient Stated Goals walk at the mall longer, stand to cook, swim for exercise   Currently in Pain? Yes   Pain Score 6    Pain Location Knee   Pain Orientation Left   Pain Descriptors / Indicators Throbbing;Tightness   Pain Type Acute pain   Pain Frequency Intermittent   Aggravating Factors  single position for extended period   Pain Relieving Factors rub            [facility] PT Assessment - 01/01/17 0001      Assessment   Medical Diagnosis L knee scope   Referring Provider Beverely Low, MD   Onset Date/Surgical Date 12/09/16   Hand Dominance Right   Next MD Visit 01/12/2017   Prior Therapy no this year     Precautions   Precautions None     Restrictions   Weight Bearing Restrictions No     Balance Screen   Has the patient fallen in the past 6 months Yes  How many times? 1   Has the patient had a decrease in activity level because of a fear of falling?  No   Is the patient reluctant to leave their home because of a fear of falling?  No     Home [employer] information [employer] residence   Living Arrangements Children   Additional Comments 3 steps to enter home, no hand rails     Prior Function   Level of Independence Independent     Cognition   Overall Cognitive Status Within Functional Limits for tasks assessed     Sensation   Additional Comments numbness on anterior L thigh     ROM / Strength   AROM / PROM / Strength Strength;AROM     AROM   AROM Assessment Site Knee   Right/Left Knee Left   Left Knee Extension 0   Left Knee Flexion 78  R 117     Strength   Strength Assessment Site Hip;Knee   Right/Left Hip Left   Left Hip Flexion 4-/5   Left Hip Extension 3+/5   Left Hip ABduction 4-/5   Right/Left Knee Left   Left Knee Flexion 4/5   Left Knee Extension 4/5     Palpation    Palpation comment TTP distal hamstring tendons     Ambulation/Gait   Gait Comments rollator, antalgic, R foot dragging                   [facility] Adult PT Treatment/Exercise - 01/01/17 0001      Exercises   Exercises Knee/Hip     Knee/Hip Exercises: Stretches   Passive Hamstring Stretch Limitations seated edge of chair     Knee/Hip Exercises: Supine   Short Arc Quad Sets Limitations supine, bilat LE     Knee/Hip Exercises: Sidelying   Hip ABduction Limitations legs extended                PT Education - 01/01/17 1154    Education provided Yes   Education Details anatomy of condition, POC, HEP, exercise form/rationale, aquatics   Person(s) Educated Patient   Methods Explanation;Demonstration;Tactile cues;Verbal cues;Handout   Comprehension Verbalized understanding;Returned demonstration;Verbal cues required;Tactile cues required;Need further instruction          PT Short Term Goals - 01/01/17 1157      PT SHORT TERM GOAL #1   Title Pt will be able to climb 3 steps step over step safely to nagivat steps at home by 5/11   Baseline leads with R at eval   Time 4   Period Weeks   Status New     PT SHORT TERM GOAL #2   Title Pt will be able to walk in the mall for 1 hour before requiring a rest break   Baseline comfortably 15 min at eval   Time 4   Period Weeks   Status New     PT SHORT TERM GOAL #3   Title Pt will be able to stand and cook for time required to finish meals   Baseline takes rest breaks at eval   Time 4   Period Weeks   Status New     PT SHORT TERM GOAL #4   Title Pt will demo at least 4+/5 strength in all hip MMT to indicate improved proximal support to knee joint   Baseline see flowsheet   Time 4   Period Weeks   Status New  Plan - 2017/01/10 1155    Clinical Impression Statement Pt presents to PT with complaints of L knee pain following scope on 3/21. Pt is able to walk and drive with some pain  after extended periods of time. Lacking supporting strength from proximal structures as well as hamstring flexibility that will benefit from skilled PT to address.    Rehab Potential Good   PT Frequency 2x / week   PT Duration 4 weeks   PT Treatment/Interventions ADLs/Self Care Home Management;Cryotherapy;Electrical Stimulation;Functional mobility training;Stair training;Gait training;Moist Heat;Therapeutic activities;Therapeutic exercise;Neuromuscular re-education;Balance training;Patient/family education;Passive range of motion;Scar mobilization;Manual techniques;Dry needling;Taping   PT Next Visit Plan nu step, hamstring & gastroc stretching, CKC   PT Home Exercise Plan SAQ, seated HSS, hip abduction   Consulted and Agree with Plan of Care Patient      Patient will benefit from skilled therapeutic intervention in order to improve the following deficits and impairments:  Decreased range of motion, Difficulty walking, Decreased activity tolerance, Pain, Impaired flexibility, Decreased strength, Impaired sensation  Visit Diagnosis: Acute pain of left knee - Plan: PT plan of care cert/re-cert  Muscle weakness (generalized) - Plan: PT plan of care cert/re-cert  Difficulty in walking, not elsewhere classified - Plan: PT plan of care cert/re-cert      G-Codes - January 10, 2017 1200    Functional Assessment Tool Used (Outpatient Only) clinical judgement, strength (MMT) and pain reports   Functional Limitation Mobility: Walking and moving around   Mobility: Walking and Moving Around Current Status (W0981) At least 60 percent but less than 80 percent impaired, limited or restricted   Mobility: Walking and Moving Around Goal Status (X9147) At least 20 percent but less than 40 percent impaired, limited or restricted       Problem List Patient Active Problem List   Diagnosis Date Noted  . COPD (chronic obstructive pulmonary disease) (HCC) 10/22/2016  . Syncope 10/05/2014  . Type 2 diabetes  mellitus without complication, without long-term current use of insulin (HCC) 07/25/2014  . Fibromyalgia 07/25/2014  . MDD (major depressive disorder), recurrent severe, without psychosis (HCC) 03/17/2013  . HEPATITIS C 08/02/2007  . IRRITABLE BOWEL SYNDROME 08/02/2007  . DYSPNEA ON EXERTION 08/02/2007    Franciscojavier Wronski C. Keanu Frickey PT, DPT 2017-01-10 12:03 PM   [facility] [address] [place], [place], [address] Phone: [phone]   Fax:  [phone]  Name: Aleanna Surrency MRN: [id number] Date of Birth: 08-Aug-1961

## 2017-01-05 ENCOUNTER — Ambulatory Visit: Payer: No Typology Code available for payment source | Admitting: Physical Therapy

## 2017-01-05 DIAGNOSIS — M25562 Pain in left knee: Secondary | ICD-10-CM | POA: Diagnosis not present

## 2017-01-05 DIAGNOSIS — R262 Difficulty in walking, not elsewhere classified: Secondary | ICD-10-CM

## 2017-01-05 DIAGNOSIS — M6281 Muscle weakness (generalized): Secondary | ICD-10-CM

## 2017-01-06 ENCOUNTER — Ambulatory Visit: Payer: No Typology Code available for payment source | Admitting: Physical Therapy

## 2017-01-06 ENCOUNTER — Encounter: Payer: Self-pay | Admitting: Physical Therapy

## 2017-01-06 DIAGNOSIS — M25562 Pain in left knee: Secondary | ICD-10-CM | POA: Diagnosis not present

## 2017-01-06 DIAGNOSIS — M6281 Muscle weakness (generalized): Secondary | ICD-10-CM

## 2017-01-06 DIAGNOSIS — R262 Difficulty in walking, not elsewhere classified: Secondary | ICD-10-CM

## 2017-01-06 NOTE — Therapy (Signed)
[facility] Outpatient Rehabilitation [facility] [address] [place], [place], [address] Phone: [phone]   Fax:  [phone]  Physical Therapy Treatment  Patient Details  Name: Susan Hogan MRN: [id number] Date of Birth: 12/21/1960 Referring Provider: Beverely Low, MD  Encounter Date: 01/05/2017      PT End of Session - 01/06/17 0738    Visit Number 2   Number of Visits 9   Date for PT Re-Evaluation 01/29/17   Authorization Type MCR- KX at visiti 15   PT Start Time 0300   PT Stop Time 0345   PT Time Calculation (min) 45 min      Past Medical History:  Diagnosis Date  . Anemia   . Arthritis    RA "states does not see Rheumatologist"  . Asthma   . Carpal tunnel syndrome    bilaterally  . Complication of anesthesia    difficulty breathing  . Degenerative disk disease    back  . Depression   . Diabetes mellitus   . Fibromyalgia   . GERD (gastroesophageal reflux disease)   . Headache(784.0)    migraines  . Heart murmur   . Hepatitis    In followup treatment for hepatitis C  . Hypertension    no medication for at this time, sees Dr. Estevan Oaks Pcp  . Hypotensive episode    hx of  . Irritable bowel syndrome   . Kidney stone    with hematuria  . Kidney stones    with hematuria  . Recurrent upper respiratory infection (URI)   . Shortness of breath   . Syncope     Past Surgical History:  Procedure Laterality Date  . DILATION AND CURETTAGE OF UTERUS    . LIVER BIOPSY    . TONSILLECTOMY    . TUBAL LIGATION      There were no vitals filed for this visit.      Subjective Assessment - 01/05/17 1505    Subjective Continued pain with sleeping.    Limitations Sitting   Currently in Pain? Yes   Pain Score 8    Pain Location Knee   Pain Orientation Left   Pain Descriptors / Indicators Throbbing;Tightness   Aggravating Factors  sitting, walking alot   Pain Relieving Factors massage, ice             [facility] PT Assessment -  01/06/17 0001      AROM   Left Knee Flexion 110                     [facility] Adult PT Treatment/Exercise - 01/06/17 0001      Knee/Hip Exercises: Stretches   Passive Hamstring Stretch Limitations seated edge of chair     Knee/Hip Exercises: Aerobic   Nustep L3 6 minutes     Knee/Hip Exercises: Standing   Other Standing Knee Exercises hamstring curls x 10; holding on to rollator     Knee/Hip Exercises: Seated   Sit to Sand 15 reps  from standard chair      Knee/Hip Exercises: Supine   Short Arc Quad Sets Left;20 reps   Bridges Limitations x 10   Straight Leg Raises Left;10 reps   Other Supine Knee/Hip Exercises ball squeeze x 10     Knee/Hip Exercises: Sidelying   Hip ABduction Left;10 reps   Hip ABduction Limitations leg extended     Manual Therapy   Manual Therapy Soft tissue mobilization   Soft tissue mobilization massage roller used to  left lateral quad and ITB                PT Education - 01/06/17 0744    Education provided Yes   Education Details HEP   Person(s) Educated Patient   Methods Explanation;Demonstration   Comprehension Verbalized understanding;Returned demonstration          PT Short Term Goals - 01/01/17 1157      PT SHORT TERM GOAL #1   Title Pt will be able to climb 3 steps step over step safely to nagivat steps at home by 5/11   Baseline leads with R at eval   Time 4   Period Weeks   Status New     PT SHORT TERM GOAL #2   Title Pt will be able to walk in the mall for 1 hour before requiring a rest break   Baseline comfortably 15 min at eval   Time 4   Period Weeks   Status New     PT SHORT TERM GOAL #3   Title Pt will be able to stand and cook for time required to finish meals   Baseline takes rest breaks at eval   Time 4   Period Weeks   Status New     PT SHORT TERM GOAL #4   Title Pt will demo at least 4+/5 strength in all hip MMT to indicate improved proximal support to knee joint   Baseline see  flowsheet   Time 4   Period Weeks   Status New                  Plan - 01/06/17 0741    Clinical Impression Statement Pt reports 8/10 resting pain. She was able to complete all therex without c/o increased pain. Began sit-stands for closed chain strengthening. Pt tolerated well. She reports lateral left thigh and knee are most painful. Massage roller used to lateral quad and ITB with pt reporting pain decreased from 8/10 to 5/10 after treatment,    PT Next Visit Plan nu step, hamstring & gastroc stretching, CKC   PT Home Exercise Plan SAQ, seated HSS, hip abduction, sit-stand    Consulted and Agree with Plan of Care Patient      Patient will benefit from skilled therapeutic intervention in order to improve the following deficits and impairments:  Decreased range of motion, Difficulty walking, Decreased activity tolerance, Pain, Impaired flexibility, Decreased strength, Impaired sensation  Visit Diagnosis: Acute pain of left knee  Muscle weakness (generalized)  Difficulty in walking, not elsewhere classified     Problem List Patient Active Problem List   Diagnosis Date Noted  . COPD (chronic obstructive pulmonary disease) (HCC) 10/22/2016  . Syncope 10/05/2014  . Type 2 diabetes mellitus without complication, without long-term current use of insulin (HCC) 07/25/2014  . Fibromyalgia 07/25/2014  . MDD (major depressive disorder), recurrent severe, without psychosis (HCC) 03/17/2013  . HEPATITIS C 08/02/2007  . IRRITABLE BOWEL SYNDROME 08/02/2007  . DYSPNEA ON EXERTION 08/02/2007    Sherrie Mustache, PTA 01/06/2017, 7:46 AM  [facility] [address] [place], [place], [address] Phone: [phone]   Fax:  [phone]  Name: Britiny Defrain MRN: [id number] Date of Birth: 1960-12-04

## 2017-01-06 NOTE — Therapy (Signed)
[facility] [address] [place], [place], [address] Phone: [phone]   Fax:  [phone]  Physical Therapy Treatment  Patient Details  Name: Susan Hogan MRN: [id number] Date of Birth: [birth year] Referring Provider: Beverely Low, MD  Encounter Date: 01/06/2017      PT End of Session - 01/06/17 1100    Visit Number 3   Number of Visits 9   Date for PT Re-Evaluation 01/29/17   Authorization Type MCR- KX at visiti 15   PT Start Time 1103   PT Stop Time 1151   PT Time Calculation (min) 48 min   Activity Tolerance Patient tolerated treatment well   Behavior During Therapy [facility] for tasks assessed/performed      Past Medical History:  Diagnosis Date  . Anemia   . Arthritis    RA "states does not see Rheumatologist"  . Asthma   . Carpal tunnel syndrome    bilaterally  . Complication of anesthesia    difficulty breathing  . Degenerative disk disease    back  . Depression   . Diabetes mellitus   . Fibromyalgia   . GERD (gastroesophageal reflux disease)   . Headache(784.0)    migraines  . Heart murmur   . Hepatitis    In followup treatment for hepatitis C  . Hypertension    no medication for at this time, sees Dr. Estevan Oaks Pcp  . Hypotensive episode    hx of  . Irritable bowel syndrome   . Kidney stone    with hematuria  . Kidney stones    with hematuria  . Recurrent upper respiratory infection (URI)   . Shortness of breath   . Syncope     Past Surgical History:  Procedure Laterality Date  . DILATION AND CURETTAGE OF UTERUS    . LIVER BIOPSY    . TONSILLECTOMY    . TUBAL LIGATION      There were no vitals filed for this visit.      Subjective Assessment - 01/06/17 1100    Subjective Pt reports feeling discomfort in the posterior aspect of her knee, not changed by position. Felt much better after doing the roller. Wants an xray of her lateral L knee due to feeling that the "bone is coming  out"    Patient Stated Goals walk at the mall longer, stand to cook, swim for exercise   Currently in Pain? Yes   Pain Score 5    Pain Location Knee   Pain Orientation Left;Posterior   Pain Descriptors / Indicators Aching            [facility] PT Assessment - 01/06/17 0001      AROM   Left Knee Flexion 110                     [facility] Adult PT Treatment/Exercise - 01/06/17 1110      Knee/Hip Exercises: Aerobic   Nustep L5 5 min     Knee/Hip Exercises: Standing   Heel Raises 20 reps  at counter   Gait Training heel-toe, arm swing-without rollator   Other Standing Knee Exercises lateral and retro stepping     Knee/Hip Exercises: Seated   Long Arc Quad 10 reps   Sit to [employer] 10 reps;without UE support  ball bw knees     Knee/Hip Exercises: Supine   Straight Leg Raises Left;15 reps     Modalities   Modalities Cryotherapy     Cryotherapy  Number Minutes Cryotherapy 10 Minutes   Cryotherapy Location Knee   Type of Cryotherapy Ice pack     Manual Therapy   Soft tissue mobilization roller & IASTM L lateral gastroc                PT Education - 01/06/17 1107    Education provided Yes   Education Details exercise form/rationale, proper use of rollator and importance of biomechanical movement of knee with and without   Person(s) Educated Patient   Methods Explanation;Demonstration;Tactile cues;Verbal cues   Comprehension Verbalized understanding;Returned demonstration;Verbal cues required;Tactile cues required;Need further instruction          PT Short Term Goals - 01/01/17 1157      PT SHORT TERM GOAL #1   Title Pt will be able to climb 3 steps step over step safely to nagivat steps at home by 5/11   Baseline leads with R at eval   Time 4   Period Weeks   Status New     PT SHORT TERM GOAL #2   Title Pt will be able to walk in the mall for 1 hour before requiring a rest break   Baseline comfortably 15 min at eval   Time 4   Period Weeks    Status New     PT SHORT TERM GOAL #3   Title Pt will be able to stand and cook for time required to finish meals   Baseline takes rest breaks at eval   Time 4   Period Weeks   Status New     PT SHORT TERM GOAL #4   Title Pt will demo at least 4+/5 strength in all hip MMT to indicate improved proximal support to knee joint   Baseline see flowsheet   Time 4   Period Weeks   Status New                  Plan - 01/06/17 1249    Clinical Impression Statement Pt denied increase in pain today. Worked on walking mechanics and discussed overuse of rollator causing excessive compression to knee joint due to forward flexion and lack of trunk rotation. I asked pt to avoid using rollator at home and just for safety in the community since she walks better without it. Concordant pain and sensation of "bone popping out" found on lateral knee in proximal gastroc trigger point and was reduced-pt was warned of soreness that will follow. cont to educate on importance of gaining full knee extension to decrease spasm in post leg.    PT Next Visit Plan nu step, hamstring & gastroc stretching, CKC; gait pattern without AD   PT Home Exercise Plan SAQ, seated HSS, hip abduction, sit-stand    Consulted and Agree with Plan of Care Patient      Patient will benefit from skilled therapeutic intervention in order to improve the following deficits and impairments:     Visit Diagnosis: Acute pain of left knee  Muscle weakness (generalized)  Difficulty in walking, not elsewhere classified     Problem List Patient Active Problem List   Diagnosis Date Noted  . COPD (chronic obstructive pulmonary disease) (HCC) 10/22/2016  . Syncope 10/05/2014  . Type 2 diabetes mellitus without complication, without long-term current use of insulin (HCC) 07/25/2014  . Fibromyalgia 07/25/2014  . MDD (major depressive disorder), recurrent severe, without psychosis (HCC) 03/17/2013  . HEPATITIS C 08/02/2007  .  IRRITABLE BOWEL SYNDROME 08/02/2007  . DYSPNEA ON EXERTION 08/02/2007  Maesyn Frisinger C. Keiandre Cygan PT, DPT 01/06/17 12:54 PM   [facility] [address] [place], [place], [address] Phone: [phone]   Fax:  [phone]  Name: Susan Hogan MRN: [id number] Date of Birth: 03/03/1961

## 2017-01-06 NOTE — Patient Instructions (Signed)
Sit to Stand: Head Upright   PLACE ROLLATOR IN FRONT OF YOU FOR SAFETY. PLACE CHAIR AT WALL SO IT DOES NOT SLIDE AWAY FROM YOU.  With head upright, stand up slowly with eyes open. TRY NOT TO USE HANDS  Repeat __10__ times per session. Do ___2_ sessions per day.  Copyright  VHI. All rights reserved.

## 2017-01-11 ENCOUNTER — Encounter: Payer: Self-pay | Admitting: Physical Therapy

## 2017-01-11 ENCOUNTER — Ambulatory Visit: Payer: No Typology Code available for payment source | Admitting: Physical Therapy

## 2017-01-11 DIAGNOSIS — M25562 Pain in left knee: Secondary | ICD-10-CM | POA: Diagnosis not present

## 2017-01-11 DIAGNOSIS — M6281 Muscle weakness (generalized): Secondary | ICD-10-CM

## 2017-01-11 DIAGNOSIS — R262 Difficulty in walking, not elsewhere classified: Secondary | ICD-10-CM

## 2017-01-11 NOTE — Therapy (Signed)
[facility] [address] [place], [place], [address] Phone: [phone]   Fax:  [phone]  Physical Therapy Treatment  Patient Details  Name: Susan Hogan MRN: [id number] Date of Birth: January 08, 1961 Referring Provider: Beverely Low, MD  Encounter Date: 01/11/2017      PT End of Session - 01/11/17 1502    Visit Number 4   Number of Visits 9   Date for PT Re-Evaluation 01/29/17   Authorization Type MCR- KX at visiti 15   PT Start Time 1502   PT Stop Time 1550   PT Time Calculation (min) 48 min   Activity Tolerance Patient tolerated treatment well   Behavior During Therapy [facility] for tasks assessed/performed      Past Medical History:  Diagnosis Date  . Anemia   . Arthritis    RA "states does not see Rheumatologist"  . Asthma   . Carpal tunnel syndrome    bilaterally  . Complication of anesthesia    difficulty breathing  . Degenerative disk disease    back  . Depression   . Diabetes mellitus   . Fibromyalgia   . GERD (gastroesophageal reflux disease)   . Headache(784.0)    migraines  . Heart murmur   . Hepatitis    In followup treatment for hepatitis C  . Hypertension    no medication for at this time, sees Dr. Estevan Oaks Pcp  . Hypotensive episode    hx of  . Irritable bowel syndrome   . Kidney stone    with hematuria  . Kidney stones    with hematuria  . Recurrent upper respiratory infection (URI)   . Shortness of breath   . Syncope     Past Surgical History:  Procedure Laterality Date  . DILATION AND CURETTAGE OF UTERUS    . LIVER BIOPSY    . TONSILLECTOMY    . TUBAL LIGATION      There were no vitals filed for this visit.      Subjective Assessment - 01/11/17 1502    Subjective Pt reports her knee was feeling good until today, probably the weather causing pain.    Currently in Pain? Yes   Pain Location Knee   Pain Orientation Left   Pain Descriptors / Indicators Aching             [facility] PT Assessment - 01/11/17 0001      AROM   Left Knee Flexion 111                     [facility] Adult PT Treatment/Exercise - 01/11/17 0001      Knee/Hip Exercises: Stretches   Passive Hamstring Stretch Limitations edge of bed green strap   Knee: Self-Stretch Limitations with green strap   Gastroc Stretch 30 seconds   Gastroc Stretch Limitations slant board   Soleus Stretch 30 seconds   Soleus Stretch Limitations slant board   Other Knee/Hip Stretches figure 4     Knee/Hip Exercises: Aerobic   Nustep L5 6 min     Knee/Hip Exercises: Standing   Heel Raises 20 reps   Heel Raises Limitations no UE support, 3s holds on toes      Knee/Hip Exercises: Supine   Straight Leg Raise with External Rotation Left;15 reps     Knee/Hip Exercises: Sidelying   Hip ABduction 20 reps;10 reps;Left     Manual Therapy   Manual Therapy Myofascial release   Soft tissue mobilization roller L anterior  tibialis, vastus lateralis & biceps femoris   Myofascial Release ant tib, vastus lat                PT Education - 01/11/17 1547    Education provided Yes   Education Details exercise form/rationale, HEP, tightness affecting biomechanical chain   Person(s) Educated Patient   Methods Explanation;Demonstration;Tactile cues;Verbal cues   Comprehension Verbalized understanding;Returned demonstration;Verbal cues required;Tactile cues required;Need further instruction          PT Short Term Goals - 01/01/17 1157      PT SHORT TERM GOAL #1   Title Pt will be able to climb 3 steps step over step safely to nagivat steps at home by 5/11   Baseline leads with R at eval   Time 4   Period Weeks   Status New     PT SHORT TERM GOAL #2   Title Pt will be able to walk in the mall for 1 hour before requiring a rest break   Baseline comfortably 15 min at eval   Time 4   Period Weeks   Status New     PT SHORT TERM GOAL #3   Title Pt will be able to stand and cook  for time required to finish meals   Baseline takes rest breaks at eval   Time 4   Period Weeks   Status New     PT SHORT TERM GOAL #4   Title Pt will demo at least 4+/5 strength in all hip MMT to indicate improved proximal support to knee joint   Baseline see flowsheet   Time 4   Period Weeks   Status New                  Plan - 01/11/17 1543    Clinical Impression Statement Concordant pain found in trigger points and tightness of lateral thigh and anterior lower leg today, decreased pain with manual treatment. Notable fatigue in hip abductors that will benefit from further strengthening to decrease use of inappropraite muscle groups.    PT Next Visit Plan nu step, hamstring & gastroc stretching, CKC; gait pattern without AD   PT Home Exercise Plan SAQ, seated HSS, hip abduction, sit-stand    Consulted and Agree with Plan of Care Patient      Patient will benefit from skilled therapeutic intervention in order to improve the following deficits and impairments:     Visit Diagnosis: Acute pain of left knee  Muscle weakness (generalized)  Difficulty in walking, not elsewhere classified     Problem List Patient Active Problem List   Diagnosis Date Noted  . COPD (chronic obstructive pulmonary disease) (HCC) 10/22/2016  . Syncope 10/05/2014  . Type 2 diabetes mellitus without complication, without long-term current use of insulin (HCC) 07/25/2014  . Fibromyalgia 07/25/2014  . MDD (major depressive disorder), recurrent severe, without psychosis (HCC) 03/17/2013  . HEPATITIS C 08/02/2007  . IRRITABLE BOWEL SYNDROME 08/02/2007  . DYSPNEA ON EXERTION 08/02/2007   Izabell Schalk C. Rajat Staver PT, DPT 01/11/17 3:48 PM   [facility] [address] [place], [place], [address] Phone: [phone]   Fax:  [phone]  Name: Areta Terwilliger MRN: [id number] Date of Birth: 18-Mar-1961

## 2017-01-12 DIAGNOSIS — Z4789 Encounter for other orthopedic aftercare: Secondary | ICD-10-CM | POA: Diagnosis not present

## 2017-01-13 ENCOUNTER — Ambulatory Visit: Payer: No Typology Code available for payment source | Admitting: Physical Therapy

## 2017-01-13 ENCOUNTER — Encounter: Payer: Self-pay | Admitting: Physical Therapy

## 2017-01-13 DIAGNOSIS — M25562 Pain in left knee: Secondary | ICD-10-CM

## 2017-01-13 DIAGNOSIS — M6281 Muscle weakness (generalized): Secondary | ICD-10-CM

## 2017-01-13 DIAGNOSIS — R262 Difficulty in walking, not elsewhere classified: Secondary | ICD-10-CM

## 2017-01-13 NOTE — Therapy (Signed)
[facility] [address] [place], [place], [address] Phone: [phone]   Fax:  [phone]  Physical Therapy Treatment  Patient Details  Name: Susan Hogan MRN: [id number] Date of Birth: Mar 15, 1961 Referring Provider: Beverely Low, MD  Encounter Date: 01/13/2017      PT End of Session - 01/13/17 1504    Visit Number 5   Number of Visits 9   Date for PT Re-Evaluation 01/29/17   Authorization Type MCR- KX at visiti 15   PT Start Time 1504  pt arrived late   PT Stop Time 1558   PT Time Calculation (min) 54 min   Activity Tolerance Patient tolerated treatment well   Behavior During Therapy [facility] for tasks assessed/performed      Past Medical History:  Diagnosis Date  . Anemia   . Arthritis    RA "states does not see Rheumatologist"  . Asthma   . Carpal tunnel syndrome    bilaterally  . Complication of anesthesia    difficulty breathing  . Degenerative disk disease    back  . Depression   . Diabetes mellitus   . Fibromyalgia   . GERD (gastroesophageal reflux disease)   . Headache(784.0)    migraines  . Heart murmur   . Hepatitis    In followup treatment for hepatitis C  . Hypertension    no medication for at this time, sees Dr. Estevan Oaks Pcp  . Hypotensive episode    hx of  . Irritable bowel syndrome   . Kidney stone    with hematuria  . Kidney stones    with hematuria  . Recurrent upper respiratory infection (URI)   . Shortness of breath   . Syncope     Past Surgical History:  Procedure Laterality Date  . DILATION AND CURETTAGE OF UTERUS    . LIVER BIOPSY    . TONSILLECTOMY    . TUBAL LIGATION      There were no vitals filed for this visit.      Subjective Assessment - 01/13/17 1507    Subjective Pt reports walking 4-5 min without rollator and is now feeling discomfort on lateral thigh. Was rolling ITB last night which might be why it is sore.    Patient Stated Goals walk at the mall  longer, stand to cook, swim for exercise   Currently in Pain? Yes   Pain Score 8    Pain Location Leg   Pain Orientation Left;Lateral   Pain Descriptors / Indicators Throbbing   Aggravating Factors  walking without rollator            [facility] PT Assessment - 01/13/17 0001      AROM   Left Knee Flexion 125                     [facility] Adult PT Treatment/Exercise - 01/13/17 0001      Knee/Hip Exercises: Stretches   Knee: Self-Stretch Limitations heel slides with green strap   Other Knee/Hip Stretches sidelying ITB stretch     Knee/Hip Exercises: Aerobic   Nustep 5 min L3     Knee/Hip Exercises: Sidelying   Clams x30 both     Modalities   Modalities Electrical Stimulation;Moist Heat     Moist Heat Therapy   Number Minutes Moist Heat 15 Minutes  concurrent with ESTIM   Moist Heat Location Other (comment)  L ITB     Electrical Stimulation   Electrical Stimulation Location L  knee   Electrical Stimulation Action IFC   Electrical Stimulation Parameters 15 min   Electrical Stimulation Goals Pain     Manual Therapy   Soft tissue mobilization IASTM VL and ITB in sidelying                PT Education - 01/13/17 1547    Education provided Yes   Education Details gait pattern, use of AD, progression of exercises and soreness with increased walking challenges, connection of ITB to knee and surgery.    Person(s) Educated Patient   Methods Explanation;Demonstration;Tactile cues;Verbal cues;Handout   Comprehension Verbalized understanding;Returned demonstration;Verbal cues required;Tactile cues required;Need further instruction          PT Short Term Goals - 01/01/17 1157      PT SHORT TERM GOAL #1   Title Pt will be able to climb 3 steps step over step safely to nagivat steps at home by 5/11   Baseline leads with R at eval   Time 4   Period Weeks   Status New     PT SHORT TERM GOAL #2   Title Pt will be able to walk in the mall for 1 hour before  requiring a rest break   Baseline comfortably 15 min at eval   Time 4   Period Weeks   Status New     PT SHORT TERM GOAL #3   Title Pt will be able to stand and cook for time required to finish meals   Baseline takes rest breaks at eval   Time 4   Period Weeks   Status New     PT SHORT TERM GOAL #4   Title Pt will demo at least 4+/5 strength in all hip MMT to indicate improved proximal support to knee joint   Baseline see flowsheet   Time 4   Period Weeks   Status New                  Plan - 01/13/17 1510    Clinical Impression Statement pt reported 8/10 pain in leg but was able to walk without antalgic pattern without rollator and was smiling and conversing. I educated pt on soreness in leg as she increases her walking challenges and what to expect. Pt goal is to stop using rollator. Improved gait pattern without rollator- notable decrease in hip flexion and increased trunk rotation and arm swing.  Concordant pain in VL and ITB found with manual treatment today. Instructed on ITB stretch and hip abductor exercises for at home to decrease demand on ITB.    PT Next Visit Plan nu step, manual for ITB & VL, ITB stretch, gait without AD   PT Home Exercise Plan SAQ, seated HSS, hip abduction, sit-stand; clam, sidelying ITB stretch   Consulted and Agree with Plan of Care Patient      Patient will benefit from skilled therapeutic intervention in order to improve the following deficits and impairments:     Visit Diagnosis: Acute pain of left knee  Muscle weakness (generalized)  Difficulty in walking, not elsewhere classified     Problem List Patient Active Problem List   Diagnosis Date Noted  . COPD (chronic obstructive pulmonary disease) (HCC) 10/22/2016  . Syncope 10/05/2014  . Type 2 diabetes mellitus without complication, without long-term current use of insulin (HCC) 07/25/2014  . Fibromyalgia 07/25/2014  . MDD (major depressive disorder), recurrent severe,  without psychosis (HCC) 03/17/2013  . HEPATITIS C 08/02/2007  . IRRITABLE BOWEL SYNDROME  08/02/2007  . DYSPNEA ON EXERTION 08/02/2007   Susan Hogan C. Ryon Layton PT, DPT 01/13/17 3:48 PM   [facility] [address] [place], [place], [address] Phone: [phone]   Fax:  [phone]  Name: Susan Hogan MRN: [id number] Date of Birth: [birth year]

## 2017-01-18 ENCOUNTER — Ambulatory Visit: Payer: No Typology Code available for payment source | Admitting: Physical Therapy

## 2017-01-20 ENCOUNTER — Encounter: Payer: Self-pay | Admitting: Physical Therapy

## 2017-01-20 ENCOUNTER — Ambulatory Visit: Payer: Medicare Other | Attending: Orthopedic Surgery | Admitting: Physical Therapy

## 2017-01-20 DIAGNOSIS — M25562 Pain in left knee: Secondary | ICD-10-CM | POA: Diagnosis not present

## 2017-01-20 DIAGNOSIS — R262 Difficulty in walking, not elsewhere classified: Secondary | ICD-10-CM | POA: Diagnosis not present

## 2017-01-20 DIAGNOSIS — M6281 Muscle weakness (generalized): Secondary | ICD-10-CM

## 2017-01-20 NOTE — Therapy (Signed)
[facility] [address] [place], [place], [address] Phone: [phone]   Fax:  [phone]  Physical Therapy Treatment  Patient Details  Name: Susan Hogan MRN: [id number] Date of Birth: Feb 05, 1961 Referring Provider: Beverely Low, MD  Encounter Date: 01/20/2017      PT End of Session - 01/20/17 1523    Visit Number 6   Number of Visits 9   Date for PT Re-Evaluation 01/29/17   Authorization Type MCR- KX at visiti 15   PT Start Time 1516  pt arrived late   PT Stop Time 1555   PT Time Calculation (min) 39 min   Activity Tolerance Patient limited by pain   Behavior During Therapy [facility] for tasks assessed/performed      Past Medical History:  Diagnosis Date  . Anemia   . Arthritis    RA "states does not see Rheumatologist"  . Asthma   . Carpal tunnel syndrome    bilaterally  . Complication of anesthesia    difficulty breathing  . Degenerative disk disease    back  . Depression   . Diabetes mellitus   . Fibromyalgia   . GERD (gastroesophageal reflux disease)   . Headache(784.0)    migraines  . Heart murmur   . Hepatitis    In followup treatment for hepatitis C  . Hypertension    no medication for at this time, sees Dr. Estevan Oaks Pcp  . Hypotensive episode    hx of  . Irritable bowel syndrome   . Kidney stone    with hematuria  . Kidney stones    with hematuria  . Recurrent upper respiratory infection (URI)   . Shortness of breath   . Syncope     Past Surgical History:  Procedure Laterality Date  . DILATION AND CURETTAGE OF UTERUS    . LIVER BIOPSY    . TONSILLECTOMY    . TUBAL LIGATION      There were no vitals filed for this visit.      Subjective Assessment - 01/20/17 1519    Subjective lateral knee pain today that began last night, was okay until then. reports rolling did not help. Has tried walking without rollator and leg gets tired without it.    Currently in Pain? Yes   Pain  Score 9    Pain Location Knee   Pain Orientation Left;Lateral   Pain Descriptors / Indicators Stabbing   Pain Frequency Intermittent                         [facility] Adult PT Treatment/Exercise - 01/20/17 0001      Knee/Hip Exercises: Stretches   Other Knee/Hip Stretches sidelying ITB stretch     Knee/Hip Exercises: Aerobic   Nustep 5 min L3     Moist Heat Therapy   Number Minutes Moist Heat 15 Minutes   Moist Heat Location Hip;Knee  sidelying     Manual Therapy   Soft tissue mobilization IASTM VL   Myofascial Release glut med & min                PT Education - 01/20/17 1544    Education provided Yes   Education Details anatomy of trigger points and tightness of lateral musculature creating lateral knee pain, weening from rollator, soreness with fatigue (concurrent with bike)   Person(s) Educated Patient   Methods Explanation   Comprehension Verbalized understanding;Need further instruction  PT Short Term Goals - 01/01/17 1157      PT SHORT TERM GOAL #1   Title Pt will be able to climb 3 steps step over step safely to nagivat steps at home by 5/11   Baseline leads with R at eval   Time 4   Period Weeks   Status New     PT SHORT TERM GOAL #2   Title Pt will be able to walk in the mall for 1 hour before requiring a rest break   Baseline comfortably 15 min at eval   Time 4   Period Weeks   Status New     PT SHORT TERM GOAL #3   Title Pt will be able to stand and cook for time required to finish meals   Baseline takes rest breaks at eval   Time 4   Period Weeks   Status New     PT SHORT TERM GOAL #4   Title Pt will demo at least 4+/5 strength in all hip MMT to indicate improved proximal support to knee joint   Baseline see flowsheet   Time 4   Period Weeks   Status New                  Plan - 01/20/17 1541    Clinical Impression Statement Pt denied tolerance to any more treatment after manual documented today.  Concordant pain noted with palpation to trigger points in VL and glut med and min. Reported decrease in lateral knee pain from 9/10 to 6/10 following treatment. Asked pt to roll and stretch before bed tonight and we will progress next visit. Pt ambulates with more upright posture when not walking with rollator and I asked her to ween from rollator, taking rest breaks as needed because she will experience soreness with fatigue.    PT Next Visit Plan nu step, manual for ITB & VL, ITB stretch, gait without AD   PT Home Exercise Plan SAQ, seated HSS, hip abduction, sit-stand; clam, sidelying ITB stretch   Consulted and Agree with Plan of Care Patient      Patient will benefit from skilled therapeutic intervention in order to improve the following deficits and impairments:     Visit Diagnosis: Acute pain of left knee  Muscle weakness (generalized)  Difficulty in walking, not elsewhere classified     Problem List Patient Active Problem List   Diagnosis Date Noted  . COPD (chronic obstructive pulmonary disease) (HCC) 10/22/2016  . Syncope 10/05/2014  . Type 2 diabetes mellitus without complication, without long-term current use of insulin (HCC) 07/25/2014  . Fibromyalgia 07/25/2014  . MDD (major depressive disorder), recurrent severe, without psychosis (HCC) 03/17/2013  . HEPATITIS C 08/02/2007  . IRRITABLE BOWEL SYNDROME 08/02/2007  . DYSPNEA ON EXERTION 08/02/2007    Zhoey Blackstock C. Courteny Egler PT, DPT 01/20/17 3:45 PM   [facility] [address] [place], [place], [address] Phone: [phone]   Fax:  [phone]  Name: Susan Hogan MRN: [id number] Date of Birth: 05-14-1961

## 2017-01-25 ENCOUNTER — Ambulatory Visit: Payer: Medicare Other | Admitting: Physical Therapy

## 2017-01-26 DIAGNOSIS — R739 Hyperglycemia, unspecified: Secondary | ICD-10-CM | POA: Diagnosis not present

## 2017-01-26 DIAGNOSIS — Z1231 Encounter for screening mammogram for malignant neoplasm of breast: Secondary | ICD-10-CM | POA: Diagnosis not present

## 2017-01-26 DIAGNOSIS — J301 Allergic rhinitis due to pollen: Secondary | ICD-10-CM | POA: Diagnosis not present

## 2017-01-26 DIAGNOSIS — R61 Generalized hyperhidrosis: Secondary | ICD-10-CM | POA: Diagnosis not present

## 2017-01-27 ENCOUNTER — Ambulatory Visit: Payer: Medicare Other | Admitting: Physical Therapy

## 2017-01-27 ENCOUNTER — Encounter: Payer: Self-pay | Admitting: Physical Therapy

## 2017-01-27 DIAGNOSIS — M25562 Pain in left knee: Secondary | ICD-10-CM | POA: Diagnosis not present

## 2017-01-27 DIAGNOSIS — R262 Difficulty in walking, not elsewhere classified: Secondary | ICD-10-CM | POA: Diagnosis not present

## 2017-01-27 DIAGNOSIS — M6281 Muscle weakness (generalized): Secondary | ICD-10-CM

## 2017-01-27 NOTE — Therapy (Signed)
[facility] [address] [place], [place], [address] Phone: [phone]   Fax:  [phone]  Physical Therapy Treatment  Patient Details  Name: Susan Hogan MRN: [id number] Date of Birth: 07/03/1961 Referring Provider: Beverely Low, MD  Encounter Date: 01/27/2017      PT End of Session - 01/27/17 1525    Visit Number 7   Number of Visits 13   Date for PT Re-Evaluation 02/19/17   Authorization Type MCR- KX at visiti 15   PT Start Time 1522  pt arrived late   PT Stop Time 1546   PT Time Calculation (min) 24 min   Activity Tolerance Patient tolerated treatment well   Behavior During Therapy [facility] for tasks assessed/performed      Past Medical History:  Diagnosis Date  . Anemia   . Arthritis    RA "states does not see Rheumatologist"  . Asthma   . Carpal tunnel syndrome    bilaterally  . Complication of anesthesia    difficulty breathing  . Degenerative disk disease    back  . Depression   . Diabetes mellitus   . Fibromyalgia   . GERD (gastroesophageal reflux disease)   . Headache(784.0)    migraines  . Heart murmur   . Hepatitis    In followup treatment for hepatitis C  . Hypertension    no medication for at this time, sees Dr. Estevan Oaks Pcp  . Hypotensive episode    hx of  . Irritable bowel syndrome   . Kidney stone    with hematuria  . Kidney stones    with hematuria  . Recurrent upper respiratory infection (URI)   . Shortness of breath   . Syncope     Past Surgical History:  Procedure Laterality Date  . DILATION AND CURETTAGE OF UTERUS    . LIVER BIOPSY    . TONSILLECTOMY    . TUBAL LIGATION      There were no vitals filed for this visit.      Subjective Assessment - 01/27/17 1523    Subjective feeling better today, sugar got to 300. Arrived using quad cane. Some discomfort still on medial and lateral aspect of knee. Knee pain is not too bad.    Patient Stated Goals walk at the mall  longer, stand to cook, swim for exercise   Currently in Pain? Yes   Pain Score 6    Pain Location Knee   Pain Orientation Left   Pain Descriptors / Indicators Tightness   Aggravating Factors  walking/standing   Pain Relieving Factors rest            [facility] PT Assessment - 01/27/17 0001      Assessment   Medical Diagnosis L knee scope   Referring Provider Beverely Low, MD   Onset Date/Surgical Date 12/09/16   Hand Dominance Right     Sensation   Additional Comments [facility]     AROM   Left Knee Flexion 120     Strength   Left Hip Flexion 4/5   Left Hip Extension 4-/5   Left Hip ABduction 4/5   Left Knee Flexion 4+/5   Left Knee Extension 4+/5     Ambulation/Gait   Gait Comments quad cane, good pattern and cadence- reports pain that is fatigue                     [facility] Adult PT Treatment/Exercise - 01/27/17 0001  Knee/Hip Exercises: Aerobic   Nustep 7 min L2     Knee/Hip Exercises: Sidelying   Clams x20     Knee/Hip Exercises: Prone   Hamstring Curl Limitations iso hamstring curl + hip ext                PT Education - 17-Feb-2017 1549    Education provided Yes   Education Details POC, HEP, soreness as endurance and strength are challenged, transition to gym membership, measurements and progress toward goals   Person(s) Educated Patient   Methods Explanation;Demonstration;Tactile cues;Verbal cues;Handout   Comprehension Verbalized understanding;Returned demonstration;Verbal cues required;Tactile cues required;Need further instruction          PT Short Term Goals - 2017/02/17 1527      PT SHORT TERM GOAL #1   Title Pt will be able to climb 3 steps step over step safely to nagivat steps at home by 5/11   Baseline leads with R leg   Status On-going     PT SHORT TERM GOAL #2   Title Pt will be able to walk in the mall for 1 hour before requiring a rest break   Baseline walking from waiting room to gym- limited by fatigue   Status  On-going     PT SHORT TERM GOAL #3   Title Pt will be able to stand and cook for time required to finish meals   Baseline feels less discomfort- feels she can finish about 50% of her cooking before resting   Status On-going     PT SHORT TERM GOAL #4   Title Pt will demo at least 4+/5 strength in all hip MMT to indicate improved proximal support to knee joint   Baseline see flowsheet   Status On-going                  Plan - Feb 17, 2017 1549    Clinical Impression Statement Pt making good progress toward goals but will benefit from further therapy for hip strengthening and transition to independent program. I asked pt to begin search for gym membership, she expressed interest in joining [employer] for [employer].    PT Treatment/Interventions ADLs/Self Care Home Management;Cryotherapy;Electrical Stimulation;Functional mobility training;Stair training;Gait training;Moist Heat;Therapeutic activities;Therapeutic exercise;Neuromuscular re-education;Balance training;Patient/family education;Passive range of motion;Scar mobilization;Manual techniques;Dry needling;Taping   PT Next Visit Plan nu step, manual for ITB & VL, ITB stretch, hip extension strengthening   PT Home Exercise Plan SAQ, seated HSS, hip abduction, sit-stand; clam, sidelying ITB stretch; prone hip ext   Consulted and Agree with Plan of Care Patient      Patient will benefit from skilled therapeutic intervention in order to improve the following deficits and impairments:  Decreased range of motion, Difficulty walking, Decreased activity tolerance, Pain, Impaired flexibility, Decreased strength, Impaired sensation  Visit Diagnosis: Acute pain of left knee - Plan: PT plan of care cert/re-cert  Muscle weakness (generalized) - Plan: PT plan of care cert/re-cert  Difficulty in walking, not elsewhere classified - Plan: PT plan of care cert/re-cert       G-Codes - 02/17/2017 1546    Functional Assessment Tool Used (Outpatient Only)  clinical judgement, strength (MMT) and pain reports   Functional Limitation Mobility: Walking and moving around   Mobility: Walking and Moving Around Current Status (B3532) At least 40 percent but less than 60 percent impaired, limited or restricted   Mobility: Walking and Moving Around Goal Status (D9242) At least 20 percent but less than 40 percent impaired, limited or restricted  Problem List Patient Active Problem List   Diagnosis Date Noted  . COPD (chronic obstructive pulmonary disease) (HCC) 10/22/2016  . Syncope 10/05/2014  . Type 2 diabetes mellitus without complication, without long-term current use of insulin (HCC) 07/25/2014  . Fibromyalgia 07/25/2014  . MDD (major depressive disorder), recurrent severe, without psychosis (HCC) 03/17/2013  . HEPATITIS C 08/02/2007  . IRRITABLE BOWEL SYNDROME 08/02/2007  . DYSPNEA ON EXERTION 08/02/2007    Sharon Stapel C. Sherlock Nancarrow PT, DPT 01/27/17 3:55 PM   [facility] [address] [place], [place], [address] Phone: [phone]   Fax:  [phone]  Name: Susan Hogan MRN: [id number] Date of Birth: 16-Nov-1960

## 2017-02-01 ENCOUNTER — Encounter: Payer: Self-pay | Admitting: Physical Therapy

## 2017-02-01 ENCOUNTER — Ambulatory Visit: Payer: Medicare Other | Admitting: Physical Therapy

## 2017-02-01 DIAGNOSIS — M25562 Pain in left knee: Secondary | ICD-10-CM | POA: Diagnosis not present

## 2017-02-01 DIAGNOSIS — M6281 Muscle weakness (generalized): Secondary | ICD-10-CM | POA: Diagnosis not present

## 2017-02-01 DIAGNOSIS — R262 Difficulty in walking, not elsewhere classified: Secondary | ICD-10-CM

## 2017-02-01 NOTE — Therapy (Signed)
[facility] [address] [place], [place], [address] Phone: [phone]   Fax:  [phone]  Physical Therapy Treatment  Patient Details  Name: Susan Hogan MRN: [id number] Date of Birth: Oct 22, 1960 Referring Provider: Beverely Low, MD  Encounter Date: 02/01/2017      PT End of Session - 02/01/17 1546    Visit Number 8   Number of Visits 13   Date for PT Re-Evaluation 02/19/17   Authorization Type MCR- KX at visiti 15   PT Start Time 1546   PT Stop Time 1636   PT Time Calculation (min) 50 min   Activity Tolerance Patient tolerated treatment well   Behavior During Therapy [facility] for tasks assessed/performed      Past Medical History:  Diagnosis Date  . Anemia   . Arthritis    RA "states does not see Rheumatologist"  . Asthma   . Carpal tunnel syndrome    bilaterally  . Complication of anesthesia    difficulty breathing  . Degenerative disk disease    back  . Depression   . Diabetes mellitus   . Fibromyalgia   . GERD (gastroesophageal reflux disease)   . Headache(784.0)    migraines  . Heart murmur   . Hepatitis    In followup treatment for hepatitis C  . Hypertension    no medication for at this time, sees Dr. Estevan Oaks Pcp  . Hypotensive episode    hx of  . Irritable bowel syndrome   . Kidney stone    with hematuria  . Kidney stones    with hematuria  . Recurrent upper respiratory infection (URI)   . Shortness of breath   . Syncope     Past Surgical History:  Procedure Laterality Date  . DILATION AND CURETTAGE OF UTERUS    . LIVER BIOPSY    . TONSILLECTOMY    . TUBAL LIGATION      There were no vitals filed for this visit.      Subjective Assessment - 02/01/17 1546    Subjective pt reports medial knee pain.    Patient Stated Goals walk at the mall longer, stand to cook, swim for exercise   Currently in Pain? Yes   Pain Score 7    Pain Location Knee   Pain Orientation Left;Medial    Pain Descriptors / Indicators Aching   Aggravating Factors  walking   Pain Relieving Factors rest                         [facility] Adult PT Treatment/Exercise - 02/01/17 0001      Knee/Hip Exercises: Stretches   Passive Hamstring Stretch Limitations supine with green strap   Gastroc Stretch 30 seconds   Gastroc Stretch Limitations slant board   Soleus Stretch 30 seconds   Soleus Stretch Limitations slant board     Knee/Hip Exercises: Aerobic   Recumbent Bike attempted-1 min increased medial knee pain   Nustep 5 min L4     Knee/Hip Exercises: Seated   Sit to [employer] 10 reps;without UE support  ball bw knees     Knee/Hip Exercises: Supine   Other Supine Knee/Hip Exercises reformer: 2 red 1 blue 2 feet press away ball bw knees; 2 red 1 yellow single foot press; march presses 2 red 1 yellow; heel raises 2R1Y; 2R1Blue static bridges ball bw knees     Knee/Hip Exercises: Sidelying   Clams x30 each green tband  Knee/Hip Exercises: Prone   Hamstring Curl 20 reps  both   Hamstring Curl Limitations iso hamstring curl + hip ext     Cryotherapy   Number Minutes Cryotherapy 10 Minutes   Cryotherapy Location Knee   Type of Cryotherapy Ice pack                PT Education - 02/01/17 1628    Education provided Yes   Education Details exercise form/rationale, HEP   Person(s) Educated Patient   Methods Demonstration;Explanation;Tactile cues;Verbal cues;Handout   Comprehension Verbalized understanding;Returned demonstration;Verbal cues required;Tactile cues required;Need further instruction          PT Short Term Goals - 01/27/17 1527      PT SHORT TERM GOAL #1   Title Pt will be able to climb 3 steps step over step safely to nagivat steps at home by 5/11   Baseline leads with R leg   Status On-going     PT SHORT TERM GOAL #2   Title Pt will be able to walk in the mall for 1 hour before requiring a rest break   Baseline walking from waiting room to  gym- limited by fatigue   Status On-going     PT SHORT TERM GOAL #3   Title Pt will be able to stand and cook for time required to finish meals   Baseline feels less discomfort- feels she can finish about 50% of her cooking before resting   Status On-going     PT SHORT TERM GOAL #4   Title Pt will demo at least 4+/5 strength in all hip MMT to indicate improved proximal support to knee joint   Baseline see flowsheet   Status On-going                  Plan - 02/01/17 1628    Clinical Impression Statement Pt unable to tolerate recumbant bike due to ext rotation at hip joint creating medial joint compression. Significant difficulty on reformer but no increase in knee pain. Will progress exercises for stretching and strengthening as tolerated with focus on proximal support musculature.    PT Next Visit Plan reformer   PT Home Exercise Plan SAQ, seated HSS, hip abduction, sit-stand; clam, sidelying ITB stretch; prone hip ext; supine HSS, figure 4;   Consulted and Agree with Plan of Care Patient      Patient will benefit from skilled therapeutic intervention in order to improve the following deficits and impairments:     Visit Diagnosis: Acute pain of left knee  Muscle weakness (generalized)  Difficulty in walking, not elsewhere classified     Problem List Patient Active Problem List   Diagnosis Date Noted  . COPD (chronic obstructive pulmonary disease) (HCC) 10/22/2016  . Syncope 10/05/2014  . Type 2 diabetes mellitus without complication, without long-term current use of insulin (HCC) 07/25/2014  . Fibromyalgia 07/25/2014  . MDD (major depressive disorder), recurrent severe, without psychosis (HCC) 03/17/2013  . HEPATITIS C 08/02/2007  . IRRITABLE BOWEL SYNDROME 08/02/2007  . DYSPNEA ON EXERTION 08/02/2007    Bently Morath C. Gentry Seeber PT, DPT 02/01/17 4:31 PM   [facility] [address] [place], [place],  [address] Phone: [phone]   Fax:  [phone]  Name: Susan Hogan MRN: [id number] Date of Birth: [birth year]

## 2017-02-08 ENCOUNTER — Ambulatory Visit: Payer: Medicare Other | Admitting: Physical Therapy

## 2017-02-08 ENCOUNTER — Encounter: Payer: Self-pay | Admitting: Physical Therapy

## 2017-02-08 DIAGNOSIS — M25562 Pain in left knee: Secondary | ICD-10-CM

## 2017-02-08 DIAGNOSIS — R262 Difficulty in walking, not elsewhere classified: Secondary | ICD-10-CM | POA: Diagnosis not present

## 2017-02-08 DIAGNOSIS — M6281 Muscle weakness (generalized): Secondary | ICD-10-CM

## 2017-02-08 NOTE — Therapy (Signed)
[place], [place], [address] Phone: [phone]   Fax:  [phone]  Physical Therapy Treatment/Discharge Summary  Patient Details  Name: Susan Hogan MRN: [id number] Date of Birth: 04/08/1961 Referring Provider: Netta Cedars, MD  Encounter Date: 02/08/2017      PT End of Session - 02/08/17 1429    Visit Number 9   Number of Visits 13   Date for PT Re-Evaluation 02/19/17   Authorization Type MCR- KX at visiti 15   PT Start Time 1429  pt arrived late   PT Stop Time 1458   PT Time Calculation (min) 29 min   Activity Tolerance Patient tolerated treatment well   Behavior During Therapy [facility] for tasks assessed/performed      Past Medical History:  Diagnosis Date  . Anemia   . Arthritis    RA "states does not see Rheumatologist"  . Asthma   . Carpal tunnel syndrome    bilaterally  . Complication of anesthesia    difficulty breathing  . Degenerative disk disease    back  . Depression   . Diabetes mellitus   . Fibromyalgia   . GERD (gastroesophageal reflux disease)   . Headache(784.0)    migraines  . Heart murmur   . Hepatitis    In followup treatment for hepatitis C  . Hypertension    no medication for at this time, sees Dr. Dinah Beers Pcp  . Hypotensive episode    hx of  . Irritable bowel syndrome   . Kidney stone    with hematuria  . Kidney stones    with hematuria  . Recurrent upper respiratory infection (URI)   . Shortness of breath   . Syncope     Past Surgical History:  Procedure Laterality Date  . DILATION AND CURETTAGE OF UTERUS    . LIVER BIOPSY    . TONSILLECTOMY    . TUBAL LIGATION      There were no vitals filed for this visit.      Subjective Assessment - 02/08/17 1430    Subjective pt reports a little slip on the step of her car today. Is feeling better, walking without cane in mall. Is ready for d/c at this time.    Patient Stated Goals walk at the  mall longer, stand to cook, swim for exercise   Currently in Pain? No/denies            [facility] PT Assessment - 02/08/17 0001      Strength   Left Hip Flexion 4/5   Left Hip Extension 5/5   Left Hip ABduction 4+/5   Left Knee Flexion 4+/5   Left Knee Extension 5/5                     [facility] Adult PT Treatment/Exercise - 02/08/17 0001      Knee/Hip Exercises: Stretches   Passive Hamstring Stretch Limitations supine with green strap   Other Knee/Hip Stretches figure 4   Other Knee/Hip Stretches sidelying ITB     Knee/Hip Exercises: Aerobic   Nustep 5 min L4     Knee/Hip Exercises: Standing   Other Standing Knee Exercises heel toe raises, no arm support     Knee/Hip Exercises: Supine   Bridges with Ball Squeeze 10 reps   Straight Leg Raises Limitations 5 each     Knee/Hip Exercises: Sidelying   Hip ABduction Limitations x10 each  PT Education - 2017-02-27 1434    Education provided Yes   Education Details exercise form/rationale, goals, HEP & importance of long term stretching & strengthening   Person(s) Educated Patient   Methods Explanation;Demonstration;Tactile cues;Verbal cues   Comprehension Verbalized understanding;Returned demonstration;Verbal cues required;Tactile cues required          PT Short Term Goals - 02/27/17 1436      PT SHORT TERM GOAL #1   Title Pt will be able to climb 3 steps step over step safely to nagivat steps at home by 5/11   Baseline reports ability at home   Status Achieved     PT SHORT TERM GOAL #2   Title Pt will be able to walk in the mall for 1 hour before requiring a rest break   Baseline did yesterday   Status Achieved     PT SHORT TERM GOAL #3   Title Pt will be able to stand and cook for time required to finish meals   Baseline able   Status Achieved     PT SHORT TERM GOAL #4   Title Pt will demo at least 4+/5 strength in all hip MMT to indicate improved proximal support to knee joint    Baseline see flowsheet   Status Partially Met                  Plan - 02-27-17 1458    Clinical Impression Statement Pt has met her goals and verbalized readiness for d/c at this time. Pt was instructed in final HEP and she was able to do with difficulty but no pain. Instructed to contact us with any further questions.    PT Treatment/Interventions ADLs/Self Care Home Management;Cryotherapy;Electrical Stimulation;Functional mobility training;Stair training;Gait training;Moist Heat;Therapeutic activities;Therapeutic exercise;Neuromuscular re-education;Balance training;Patient/family education;Passive range of motion;Scar mobilization;Manual techniques;Dry needling;Taping      Patient will benefit from skilled therapeutic intervention in order to improve the following deficits and impairments:  Decreased range of motion, Difficulty walking, Decreased activity tolerance, Pain, Impaired flexibility, Decreased strength, Impaired sensation  Visit Diagnosis: Muscle weakness (generalized)  Acute pain of left knee  Difficulty in walking, not elsewhere classified       G-Codes - Feb 27, 2017 1459    Functional Assessment Tool Used (Outpatient Only) clinical judgement, strength (MMT) and pain reports   Functional Limitation Mobility: Walking and moving around   Mobility: Walking and Moving Around Goal Status [phone]) At least 20 percent but less than 40 percent impaired, limited or restricted   Mobility: Walking and Moving Around Discharge Status [phone]) At least 20 percent but less than 40 percent impaired, limited or restricted      Problem List Patient Active Problem List   Diagnosis Date Noted  . COPD (chronic obstructive pulmonary disease) ([place]) 10/22/2016  . Syncope 10/05/2014  . Type 2 diabetes mellitus without complication, without long-term current use of insulin ([place]) 07/25/2014  . Fibromyalgia 07/25/2014  . MDD (major depressive disorder), recurrent severe, without  psychosis ([place]) 03/17/2013  . HEPATITIS C 08/02/2007  . IRRITABLE BOWEL SYNDROME 08/02/2007  . DYSPNEA ON EXERTION 08/02/2007   PHYSICAL THERAPY DISCHARGE SUMMARY  Visits from Start of Care: 9  Current functional level related to goals / functional outcomes: See above   Remaining deficits: See above   Education / Equipment: Anatomy of condition, POC, HEP, exercise form/rationale  Plan: Patient agrees to discharge.  Patient goals were met. Patient is being discharged due to meeting the stated rehab goals.  ?????    Janett Billow  Margie Billet PT, DPT 02/08/17 3:00 PM    [facility] [address] [place], [place], [address] Phone: [phone]   Fax:  [phone]  Name: Susan Hogan MRN: [id number] Date of Birth: 1960/10/31

## 2017-02-09 DIAGNOSIS — Z4789 Encounter for other orthopedic aftercare: Secondary | ICD-10-CM | POA: Diagnosis not present

## 2017-02-16 ENCOUNTER — Encounter: Payer: Self-pay | Admitting: Physical Therapy

## 2017-02-16 DIAGNOSIS — K8020 Calculus of gallbladder without cholecystitis without obstruction: Secondary | ICD-10-CM | POA: Diagnosis not present

## 2017-02-16 DIAGNOSIS — R7989 Other specified abnormal findings of blood chemistry: Secondary | ICD-10-CM | POA: Diagnosis not present

## 2017-02-16 DIAGNOSIS — B182 Chronic viral hepatitis C: Secondary | ICD-10-CM | POA: Diagnosis not present

## 2017-02-16 DIAGNOSIS — R140 Abdominal distension (gaseous): Secondary | ICD-10-CM | POA: Diagnosis not present

## 2017-02-17 ENCOUNTER — Encounter: Payer: Self-pay | Admitting: Physical Therapy

## 2017-02-22 ENCOUNTER — Ambulatory Visit: Payer: Medicare Other | Admitting: Physical Therapy

## 2017-02-23 DIAGNOSIS — R109 Unspecified abdominal pain: Secondary | ICD-10-CM | POA: Diagnosis not present

## 2017-02-23 DIAGNOSIS — D72829 Elevated white blood cell count, unspecified: Secondary | ICD-10-CM | POA: Diagnosis not present

## 2017-02-23 DIAGNOSIS — K819 Cholecystitis, unspecified: Secondary | ICD-10-CM | POA: Diagnosis not present

## 2017-02-23 DIAGNOSIS — R110 Nausea: Secondary | ICD-10-CM | POA: Diagnosis not present

## 2017-02-23 DIAGNOSIS — R10819 Abdominal tenderness, unspecified site: Secondary | ICD-10-CM | POA: Diagnosis not present

## 2017-02-23 DIAGNOSIS — K828 Other specified diseases of gallbladder: Secondary | ICD-10-CM | POA: Diagnosis not present

## 2017-02-23 DIAGNOSIS — K219 Gastro-esophageal reflux disease without esophagitis: Secondary | ICD-10-CM | POA: Diagnosis not present

## 2017-02-23 DIAGNOSIS — R6883 Chills (without fever): Secondary | ICD-10-CM | POA: Diagnosis not present

## 2017-02-23 DIAGNOSIS — K810 Acute cholecystitis: Secondary | ICD-10-CM | POA: Diagnosis not present

## 2017-02-23 DIAGNOSIS — R7989 Other specified abnormal findings of blood chemistry: Secondary | ICD-10-CM | POA: Diagnosis not present

## 2017-02-23 DIAGNOSIS — J449 Chronic obstructive pulmonary disease, unspecified: Secondary | ICD-10-CM | POA: Diagnosis not present

## 2017-02-23 DIAGNOSIS — E119 Type 2 diabetes mellitus without complications: Secondary | ICD-10-CM | POA: Diagnosis not present

## 2017-02-23 DIAGNOSIS — N281 Cyst of kidney, acquired: Secondary | ICD-10-CM | POA: Diagnosis not present

## 2017-02-23 DIAGNOSIS — B1920 Unspecified viral hepatitis C without hepatic coma: Secondary | ICD-10-CM | POA: Diagnosis not present

## 2017-02-23 DIAGNOSIS — R634 Abnormal weight loss: Secondary | ICD-10-CM | POA: Diagnosis not present

## 2017-02-23 DIAGNOSIS — R1011 Right upper quadrant pain: Secondary | ICD-10-CM | POA: Diagnosis not present

## 2017-02-23 DIAGNOSIS — Z6841 Body Mass Index (BMI) 40.0 and over, adult: Secondary | ICD-10-CM | POA: Diagnosis not present

## 2017-02-23 DIAGNOSIS — I10 Essential (primary) hypertension: Secondary | ICD-10-CM | POA: Diagnosis not present

## 2017-02-23 DIAGNOSIS — R945 Abnormal results of liver function studies: Secondary | ICD-10-CM | POA: Diagnosis not present

## 2017-02-23 DIAGNOSIS — R509 Fever, unspecified: Secondary | ICD-10-CM | POA: Diagnosis not present

## 2017-02-23 DIAGNOSIS — R600 Localized edema: Secondary | ICD-10-CM | POA: Diagnosis not present

## 2017-02-24 ENCOUNTER — Encounter: Payer: Self-pay | Admitting: Physical Therapy

## 2017-02-25 DIAGNOSIS — R6883 Chills (without fever): Secondary | ICD-10-CM | POA: Diagnosis not present

## 2017-02-25 DIAGNOSIS — R10819 Abdominal tenderness, unspecified site: Secondary | ICD-10-CM | POA: Diagnosis not present

## 2017-02-25 DIAGNOSIS — R1011 Right upper quadrant pain: Secondary | ICD-10-CM | POA: Diagnosis not present

## 2017-02-25 DIAGNOSIS — R634 Abnormal weight loss: Secondary | ICD-10-CM | POA: Diagnosis not present

## 2017-02-25 DIAGNOSIS — R945 Abnormal results of liver function studies: Secondary | ICD-10-CM | POA: Diagnosis not present

## 2017-02-25 DIAGNOSIS — E119 Type 2 diabetes mellitus without complications: Secondary | ICD-10-CM | POA: Diagnosis not present

## 2017-02-25 DIAGNOSIS — K828 Other specified diseases of gallbladder: Secondary | ICD-10-CM | POA: Diagnosis not present

## 2017-02-25 DIAGNOSIS — N281 Cyst of kidney, acquired: Secondary | ICD-10-CM | POA: Diagnosis not present

## 2017-02-25 DIAGNOSIS — K819 Cholecystitis, unspecified: Secondary | ICD-10-CM | POA: Diagnosis not present

## 2017-02-25 DIAGNOSIS — K219 Gastro-esophageal reflux disease without esophagitis: Secondary | ICD-10-CM | POA: Diagnosis not present

## 2017-02-25 DIAGNOSIS — R600 Localized edema: Secondary | ICD-10-CM | POA: Diagnosis not present

## 2017-02-25 DIAGNOSIS — I10 Essential (primary) hypertension: Secondary | ICD-10-CM | POA: Diagnosis not present

## 2017-02-25 DIAGNOSIS — J449 Chronic obstructive pulmonary disease, unspecified: Secondary | ICD-10-CM | POA: Diagnosis not present

## 2017-02-25 DIAGNOSIS — R7989 Other specified abnormal findings of blood chemistry: Secondary | ICD-10-CM | POA: Diagnosis not present

## 2017-02-25 DIAGNOSIS — R110 Nausea: Secondary | ICD-10-CM | POA: Diagnosis not present

## 2017-02-26 DIAGNOSIS — E119 Type 2 diabetes mellitus without complications: Secondary | ICD-10-CM | POA: Diagnosis present

## 2017-02-26 DIAGNOSIS — K819 Cholecystitis, unspecified: Secondary | ICD-10-CM | POA: Diagnosis not present

## 2017-02-26 DIAGNOSIS — K824 Cholesterolosis of gallbladder: Secondary | ICD-10-CM | POA: Diagnosis not present

## 2017-02-26 DIAGNOSIS — F329 Major depressive disorder, single episode, unspecified: Secondary | ICD-10-CM | POA: Diagnosis present

## 2017-02-26 DIAGNOSIS — B1920 Unspecified viral hepatitis C without hepatic coma: Secondary | ICD-10-CM | POA: Diagnosis present

## 2017-02-26 DIAGNOSIS — Z880 Allergy status to penicillin: Secondary | ICD-10-CM | POA: Diagnosis not present

## 2017-02-26 DIAGNOSIS — Z6841 Body Mass Index (BMI) 40.0 and over, adult: Secondary | ICD-10-CM | POA: Diagnosis not present

## 2017-02-26 DIAGNOSIS — I10 Essential (primary) hypertension: Secondary | ICD-10-CM | POA: Diagnosis present

## 2017-02-26 DIAGNOSIS — F419 Anxiety disorder, unspecified: Secondary | ICD-10-CM | POA: Diagnosis present

## 2017-02-26 DIAGNOSIS — J449 Chronic obstructive pulmonary disease, unspecified: Secondary | ICD-10-CM | POA: Diagnosis present

## 2017-02-26 DIAGNOSIS — K219 Gastro-esophageal reflux disease without esophagitis: Secondary | ICD-10-CM | POA: Diagnosis present

## 2017-02-26 DIAGNOSIS — Z7951 Long term (current) use of inhaled steroids: Secondary | ICD-10-CM | POA: Diagnosis not present

## 2017-02-26 DIAGNOSIS — K811 Chronic cholecystitis: Secondary | ICD-10-CM | POA: Diagnosis not present

## 2017-02-26 DIAGNOSIS — Z888 Allergy status to other drugs, medicaments and biological substances status: Secondary | ICD-10-CM | POA: Diagnosis not present

## 2017-02-26 DIAGNOSIS — R932 Abnormal findings on diagnostic imaging of liver and biliary tract: Secondary | ICD-10-CM | POA: Diagnosis not present

## 2017-02-26 DIAGNOSIS — B182 Chronic viral hepatitis C: Secondary | ICD-10-CM | POA: Diagnosis not present

## 2017-02-26 DIAGNOSIS — Z79899 Other long term (current) drug therapy: Secondary | ICD-10-CM | POA: Diagnosis not present

## 2017-02-26 DIAGNOSIS — R109 Unspecified abdominal pain: Secondary | ICD-10-CM | POA: Diagnosis not present

## 2017-02-26 DIAGNOSIS — K810 Acute cholecystitis: Secondary | ICD-10-CM | POA: Diagnosis present

## 2017-02-26 DIAGNOSIS — K760 Fatty (change of) liver, not elsewhere classified: Secondary | ICD-10-CM | POA: Diagnosis not present

## 2017-03-04 ENCOUNTER — Encounter: Payer: Self-pay | Admitting: Physician Assistant

## 2017-03-05 DIAGNOSIS — R109 Unspecified abdominal pain: Secondary | ICD-10-CM | POA: Diagnosis not present

## 2017-05-03 ENCOUNTER — Encounter (HOSPITAL_COMMUNITY): Payer: Self-pay

## 2017-05-03 ENCOUNTER — Emergency Department (HOSPITAL_COMMUNITY)
Admission: EM | Admit: 2017-05-03 | Discharge: 2017-05-03 | Disposition: A | Discharge status: Home / Self Care | Payer: Medicare Other | Attending: Emergency Medicine | Admitting: Emergency Medicine

## 2017-05-03 DIAGNOSIS — Z880 Allergy status to penicillin: Secondary | ICD-10-CM | POA: Diagnosis not present

## 2017-05-03 DIAGNOSIS — I10 Essential (primary) hypertension: Secondary | ICD-10-CM | POA: Diagnosis not present

## 2017-05-03 DIAGNOSIS — G43909 Migraine, unspecified, not intractable, without status migrainosus: Secondary | ICD-10-CM | POA: Insufficient documentation

## 2017-05-03 DIAGNOSIS — E119 Type 2 diabetes mellitus without complications: Secondary | ICD-10-CM | POA: Diagnosis not present

## 2017-05-03 DIAGNOSIS — H5711 Ocular pain, right eye: Secondary | ICD-10-CM | POA: Diagnosis not present

## 2017-05-03 DIAGNOSIS — R51 Headache: Secondary | ICD-10-CM | POA: Diagnosis not present

## 2017-05-03 DIAGNOSIS — Z888 Allergy status to other drugs, medicaments and biological substances status: Secondary | ICD-10-CM | POA: Diagnosis not present

## 2017-05-03 DIAGNOSIS — H1131 Conjunctival hemorrhage, right eye: Secondary | ICD-10-CM | POA: Diagnosis not present

## 2017-05-03 DIAGNOSIS — J449 Chronic obstructive pulmonary disease, unspecified: Secondary | ICD-10-CM | POA: Diagnosis not present

## 2017-05-03 DIAGNOSIS — K219 Gastro-esophageal reflux disease without esophagitis: Secondary | ICD-10-CM | POA: Diagnosis not present

## 2017-05-03 DIAGNOSIS — Z79899 Other long term (current) drug therapy: Secondary | ICD-10-CM | POA: Diagnosis not present

## 2017-05-03 DIAGNOSIS — E739 Lactose intolerance, unspecified: Secondary | ICD-10-CM | POA: Diagnosis not present

## 2017-05-03 LAB — CBG MONITORING, ED: GLUCOSE-CAPILLARY: 130 mg/dL — AB (ref 65–99)

## 2017-05-03 NOTE — ED Triage Notes (Signed)
Pt c/o right eye pain x 1 day, and started with with and itch and then became blood shot and states that the left eye is beginning to hurt. Pt states that she had a very bad migraine yesterday, that was different that her usual migraines. Pt denies blurred vision, or and visual changes.

## 2017-05-03 NOTE — ED Notes (Signed)
No answer when called for room x 3 

## 2017-05-23 DIAGNOSIS — R0602 Shortness of breath: Secondary | ICD-10-CM | POA: Diagnosis not present

## 2017-05-23 DIAGNOSIS — Z884 Allergy status to anesthetic agent status: Secondary | ICD-10-CM | POA: Diagnosis not present

## 2017-05-23 DIAGNOSIS — R0789 Other chest pain: Secondary | ICD-10-CM | POA: Diagnosis not present

## 2017-05-23 DIAGNOSIS — Z880 Allergy status to penicillin: Secondary | ICD-10-CM | POA: Diagnosis not present

## 2017-05-23 DIAGNOSIS — I10 Essential (primary) hypertension: Secondary | ICD-10-CM | POA: Diagnosis not present

## 2017-05-23 DIAGNOSIS — K219 Gastro-esophageal reflux disease without esophagitis: Secondary | ICD-10-CM | POA: Diagnosis not present

## 2017-05-23 DIAGNOSIS — E739 Lactose intolerance, unspecified: Secondary | ICD-10-CM | POA: Diagnosis not present

## 2017-05-23 DIAGNOSIS — R079 Chest pain, unspecified: Secondary | ICD-10-CM | POA: Diagnosis not present

## 2017-05-23 DIAGNOSIS — Z79899 Other long term (current) drug therapy: Secondary | ICD-10-CM | POA: Diagnosis not present

## 2017-05-23 DIAGNOSIS — M549 Dorsalgia, unspecified: Secondary | ICD-10-CM | POA: Diagnosis not present

## 2017-05-23 DIAGNOSIS — R630 Anorexia: Secondary | ICD-10-CM | POA: Diagnosis not present

## 2017-05-23 DIAGNOSIS — Z885 Allergy status to narcotic agent status: Secondary | ICD-10-CM | POA: Diagnosis not present

## 2017-05-23 DIAGNOSIS — Z888 Allergy status to other drugs, medicaments and biological substances status: Secondary | ICD-10-CM | POA: Diagnosis not present

## 2017-05-23 DIAGNOSIS — J449 Chronic obstructive pulmonary disease, unspecified: Secondary | ICD-10-CM | POA: Diagnosis not present

## 2017-05-23 DIAGNOSIS — K222 Esophageal obstruction: Secondary | ICD-10-CM | POA: Diagnosis not present

## 2017-05-23 DIAGNOSIS — R1310 Dysphagia, unspecified: Secondary | ICD-10-CM | POA: Diagnosis not present

## 2017-05-23 DIAGNOSIS — E119 Type 2 diabetes mellitus without complications: Secondary | ICD-10-CM | POA: Diagnosis not present

## 2017-05-28 DIAGNOSIS — K760 Fatty (change of) liver, not elsewhere classified: Secondary | ICD-10-CM | POA: Diagnosis not present

## 2017-05-28 DIAGNOSIS — R1310 Dysphagia, unspecified: Secondary | ICD-10-CM | POA: Diagnosis not present

## 2017-05-28 DIAGNOSIS — Z1231 Encounter for screening mammogram for malignant neoplasm of breast: Secondary | ICD-10-CM | POA: Diagnosis not present

## 2017-05-28 DIAGNOSIS — B182 Chronic viral hepatitis C: Secondary | ICD-10-CM | POA: Diagnosis not present

## 2017-05-28 DIAGNOSIS — R1013 Epigastric pain: Secondary | ICD-10-CM | POA: Diagnosis not present

## 2017-06-06 DIAGNOSIS — R945 Abnormal results of liver function studies: Secondary | ICD-10-CM | POA: Diagnosis not present

## 2017-06-06 DIAGNOSIS — R5383 Other fatigue: Secondary | ICD-10-CM | POA: Diagnosis not present

## 2017-06-06 DIAGNOSIS — R7309 Other abnormal glucose: Secondary | ICD-10-CM | POA: Diagnosis not present

## 2017-06-06 DIAGNOSIS — G43009 Migraine without aura, not intractable, without status migrainosus: Secondary | ICD-10-CM | POA: Diagnosis not present

## 2017-06-06 DIAGNOSIS — J449 Chronic obstructive pulmonary disease, unspecified: Secondary | ICD-10-CM | POA: Diagnosis not present

## 2017-06-06 DIAGNOSIS — Z9109 Other allergy status, other than to drugs and biological substances: Secondary | ICD-10-CM | POA: Diagnosis not present

## 2017-06-06 DIAGNOSIS — I10 Essential (primary) hypertension: Secondary | ICD-10-CM | POA: Diagnosis not present

## 2017-06-06 DIAGNOSIS — K219 Gastro-esophageal reflux disease without esophagitis: Secondary | ICD-10-CM | POA: Diagnosis not present

## 2017-06-06 DIAGNOSIS — E119 Type 2 diabetes mellitus without complications: Secondary | ICD-10-CM | POA: Diagnosis not present

## 2017-06-06 DIAGNOSIS — G43901 Migraine, unspecified, not intractable, with status migrainosus: Secondary | ICD-10-CM | POA: Diagnosis not present

## 2017-06-10 DIAGNOSIS — H539 Unspecified visual disturbance: Secondary | ICD-10-CM | POA: Diagnosis not present

## 2017-06-10 DIAGNOSIS — R0602 Shortness of breath: Secondary | ICD-10-CM | POA: Diagnosis not present

## 2017-06-10 DIAGNOSIS — Z1231 Encounter for screening mammogram for malignant neoplasm of breast: Secondary | ICD-10-CM | POA: Diagnosis not present

## 2017-06-10 DIAGNOSIS — R51 Headache: Secondary | ICD-10-CM | POA: Diagnosis not present

## 2017-06-10 DIAGNOSIS — R05 Cough: Secondary | ICD-10-CM | POA: Diagnosis not present

## 2017-06-10 DIAGNOSIS — R110 Nausea: Secondary | ICD-10-CM | POA: Diagnosis not present

## 2017-06-10 DIAGNOSIS — R1031 Right lower quadrant pain: Secondary | ICD-10-CM | POA: Diagnosis not present

## 2017-07-19 NOTE — Progress Notes (Deleted)
Subjective:   Susan Hogan is a 56 y.o. female who presents for an Initial Medicare Annual Wellness Visit.  The Patient was informed that the wellness visit is to identify future health risk and educate and initiate measures that can reduce risk for increased disease through the lifespan.    Annual Wellness Assessment  Reports health as   Preventive Screening -Counseling & Management  Medicare Annual Preventive Care Visit - Subsequent Last OV 10/2016 Gall stone Pain left knee in PT  Colonoscopy 09/2015 DUE  Health Maintenance Due  Topic Date Due  . PNEUMOCOCCAL POLYSACCHARIDE VACCINE (1) 11/06/1960  . OPHTHALMOLOGY EXAM  11/06/1968  . TETANUS/TDAP  11/06/1977  . PAP SMEAR  11/07/1979  . MAMMOGRAM  11/06/2008  . COLONOSCOPY  10/21/2015  . INFLUENZA VACCINE  04/21/2017  . HEMOGLOBIN A1C  04/21/2017     Describes Health as poor, fair, good or great?   VS reviewed;   Diet   BMI  Exercise  Dental  Stressors:   Sleep patterns:   Pain?    Cardiac Risk Factors Addressed Hyperlipidemia - chol/ HDL 3; chol 149; hdl 49; trig 127 Diabetes - A1c 5.6  Advanced Directives  @Patient  Care Team: , Betty G, MD as PCP - General (Family Medicine)                Objective:    There were no vitals filed for this visit. There is no height or weight on file to calculate BMI.   Current Medications (verified) Outpatient Encounter Prescriptions as of 07/20/2017  Medication Sig  . albuterol (PROVENTIL HFA;VENTOLIN HFA) 108 (90 Base) MCG/ACT inhaler Inhale 2 puffs into the lungs every 6 (six) hours as needed for wheezing or shortness of breath.  . butalbital-acetaminophen-caffeine (FIORICET WITH CODEINE) 50-325-40-30 MG capsule Take 1 capsule by mouth every 6 (six) hours as needed for headache.  . dicyclomine (BENTYL) 20 MG tablet Take 20 mg by mouth 2 (two) times daily.  . ferrous sulfate 325 (65 FE) MG tablet Take 325 mg by mouth daily with breakfast.    . glucose blood (ACCU-CHEK ACTIVE STRIPS) test strip Use as instructed  . Multiple Vitamins-Minerals (HAIR/SKIN/NAILS) TABS Take 1 tablet by mouth daily.  . ondansetron (ZOFRAN) 4 MG tablet Take 1 tablet (4 mg total) by mouth every 8 (eight) hours as needed for nausea or vomiting.   No facility-administered encounter medications on file as of 07/20/2017.     Allergies (verified) Anesthetic ether [ether]; Nubain [nalbuphine hcl]; Penicillins; Advair diskus [fluticasone-salmeterol]; and Lactose intolerance (gi)   History: Past Medical History:  Diagnosis Date  . Anemia   . Arthritis    RA "states does not see Rheumatologist"  . Asthma   . Carpal tunnel syndrome    bilaterally  . Complication of anesthesia    difficulty breathing  . Degenerative disk disease    back  . Depression   . Diabetes mellitus   . Fibromyalgia   . GERD (gastroesophageal reflux disease)   . Headache(784.0)    migraines  . Heart murmur   . Hepatitis    In followup treatment for hepatitis C  . Hypertension    no medication for at this time, sees Dr. 07/22/2017 Pcp  . Hypotensive episode    hx of  . Irritable bowel syndrome   . Kidney stone    with hematuria  . Kidney stones    with hematuria  . Recurrent upper respiratory infection (URI)   . Shortness of breath   .  Syncope    Past Surgical History:  Procedure Laterality Date  . DILATION AND CURETTAGE OF UTERUS    . LIVER BIOPSY    . TONSILLECTOMY    . TUBAL LIGATION     Family History  Problem Relation Age of Onset  . Heart disease Mother   . Diabetes Mother   . Asthma Mother   . Anemia Mother   . Colon cancer Maternal Uncle   . Heart disease Maternal Uncle   . Heart disease Maternal Grandmother   . Diabetes Maternal Grandmother   . Asthma Maternal Grandmother    Social History   Occupational History  . Disabled    Social History Main Topics  . Smoking status: Never Smoker  . Smokeless tobacco: Never Used  . Alcohol  use No  . Drug use: No  . Sexual activity: Not on file    Tobacco Counseling Counseling given: Not Answered   Activities of Daily Living No flowsheet data found.  Immunizations and Health Maintenance  There is no immunization history on file for this patient. Health Maintenance Due  Topic Date Due  . PNEUMOCOCCAL POLYSACCHARIDE VACCINE (1) 11/06/1960  . OPHTHALMOLOGY EXAM  11/06/1968  . TETANUS/TDAP  11/06/1977  . PAP SMEAR  11/07/1979  . MAMMOGRAM  11/06/2008  . COLONOSCOPY  10/21/2015  . INFLUENZA VACCINE  04/21/2017  . HEMOGLOBIN A1C  04/21/2017    Patient Care Team: [place], Betty G, MD as PCP - General (Family Medicine)  Indicate any recent Medical Services you may have received from other than [facility] providers in the past year (date may be approximate).     Assessment:   This is a routine wellness examination for Susan Hogan. ***  Hearing/Vision screen No exam data present  Dietary issues and exercise activities discussed:    Goals    None     Depression Screen PHQ 2/9 Scores 10/13/2016  PHQ - 2 Score 0    Fall Risk Fall Risk  10/13/2016  Falls in the past year? No    Cognitive Function:        Screening Tests Health Maintenance  Topic Date Due  . PNEUMOCOCCAL POLYSACCHARIDE VACCINE (1) 11/06/1960  . OPHTHALMOLOGY EXAM  11/06/1968  . TETANUS/TDAP  11/06/1977  . PAP SMEAR  11/07/1979  . MAMMOGRAM  11/06/2008  . COLONOSCOPY  10/21/2015  . INFLUENZA VACCINE  04/21/2017  . HEMOGLOBIN A1C  04/21/2017  . FOOT EXAM  10/22/2017  . URINE MICROALBUMIN  10/22/2017  . Hepatitis C Screening  Completed  . HIV Screening  Completed      Plan:   ***  I have personally reviewed and noted the following in the patient's chart:   . Medical and social history . Use of alcohol, tobacco or illicit drugs  . Current medications and supplements . Functional ability and status . Nutritional status . Physical activity . Advanced directives . List of other  physicians . Hospitalizations, surgeries, and ER visits in previous 12 months . Vitals . Screenings to include cognitive, depression, and falls . Referrals and appointments  In addition, I have reviewed and discussed with patient certain preventive protocols, quality metrics, and best practice recommendations. A written personalized care plan for preventive services as well as general preventive health recommendations were provided to patient.     Montine Circle, RN   07/19/2017

## 2017-07-20 ENCOUNTER — Ambulatory Visit: Payer: Medicare Other

## 2017-09-06 DIAGNOSIS — J441 Chronic obstructive pulmonary disease with (acute) exacerbation: Secondary | ICD-10-CM | POA: Diagnosis not present

## 2017-09-06 DIAGNOSIS — J0100 Acute maxillary sinusitis, unspecified: Secondary | ICD-10-CM | POA: Diagnosis not present

## 2017-09-06 DIAGNOSIS — H9202 Otalgia, left ear: Secondary | ICD-10-CM | POA: Diagnosis not present

## 2017-09-21 HISTORY — PX: CHOLECYSTECTOMY: SHX55

## 2017-09-27 DIAGNOSIS — Z1239 Encounter for other screening for malignant neoplasm of breast: Secondary | ICD-10-CM | POA: Insufficient documentation

## 2017-09-29 DIAGNOSIS — Z1231 Encounter for screening mammogram for malignant neoplasm of breast: Secondary | ICD-10-CM | POA: Diagnosis not present

## 2017-09-29 DIAGNOSIS — E119 Type 2 diabetes mellitus without complications: Secondary | ICD-10-CM | POA: Diagnosis not present

## 2017-09-29 DIAGNOSIS — J441 Chronic obstructive pulmonary disease with (acute) exacerbation: Secondary | ICD-10-CM | POA: Diagnosis not present

## 2017-09-29 DIAGNOSIS — B182 Chronic viral hepatitis C: Secondary | ICD-10-CM | POA: Diagnosis not present

## 2017-10-19 DIAGNOSIS — R062 Wheezing: Secondary | ICD-10-CM | POA: Diagnosis not present

## 2017-10-19 DIAGNOSIS — J0110 Acute frontal sinusitis, unspecified: Secondary | ICD-10-CM | POA: Diagnosis not present

## 2017-10-19 DIAGNOSIS — J4531 Mild persistent asthma with (acute) exacerbation: Secondary | ICD-10-CM | POA: Diagnosis not present

## 2017-11-08 ENCOUNTER — Other Ambulatory Visit: Payer: Self-pay | Admitting: Family Medicine

## 2017-11-08 DIAGNOSIS — Z1231 Encounter for screening mammogram for malignant neoplasm of breast: Secondary | ICD-10-CM

## 2017-11-09 DIAGNOSIS — J4531 Mild persistent asthma with (acute) exacerbation: Secondary | ICD-10-CM | POA: Diagnosis not present

## 2017-11-09 DIAGNOSIS — R5381 Other malaise: Secondary | ICD-10-CM | POA: Diagnosis not present

## 2017-11-09 DIAGNOSIS — B182 Chronic viral hepatitis C: Secondary | ICD-10-CM | POA: Diagnosis not present

## 2017-11-09 DIAGNOSIS — R5382 Chronic fatigue, unspecified: Secondary | ICD-10-CM | POA: Diagnosis not present

## 2017-11-09 DIAGNOSIS — R718 Other abnormality of red blood cells: Secondary | ICD-10-CM | POA: Diagnosis not present

## 2017-11-09 DIAGNOSIS — R203 Hyperesthesia: Secondary | ICD-10-CM | POA: Diagnosis not present

## 2017-12-06 ENCOUNTER — Encounter (HOSPITAL_COMMUNITY): Payer: Self-pay | Admitting: Emergency Medicine

## 2017-12-06 ENCOUNTER — Encounter: Payer: Self-pay | Admitting: Neurology

## 2017-12-06 ENCOUNTER — Emergency Department (HOSPITAL_COMMUNITY)
Admission: EM | Admit: 2017-12-06 | Discharge: 2017-12-06 | Disposition: A | Discharge status: Home / Self Care | Payer: Medicare Other | Attending: Emergency Medicine | Admitting: Emergency Medicine

## 2017-12-06 ENCOUNTER — Other Ambulatory Visit: Payer: Self-pay

## 2017-12-06 DIAGNOSIS — R51 Headache: Secondary | ICD-10-CM

## 2017-12-06 DIAGNOSIS — J449 Chronic obstructive pulmonary disease, unspecified: Secondary | ICD-10-CM | POA: Diagnosis not present

## 2017-12-06 DIAGNOSIS — M79605 Pain in left leg: Secondary | ICD-10-CM | POA: Insufficient documentation

## 2017-12-06 DIAGNOSIS — M5481 Occipital neuralgia: Secondary | ICD-10-CM | POA: Diagnosis not present

## 2017-12-06 DIAGNOSIS — Z79899 Other long term (current) drug therapy: Secondary | ICD-10-CM | POA: Insufficient documentation

## 2017-12-06 DIAGNOSIS — E119 Type 2 diabetes mellitus without complications: Secondary | ICD-10-CM | POA: Insufficient documentation

## 2017-12-06 DIAGNOSIS — R519 Headache, unspecified: Secondary | ICD-10-CM

## 2017-12-06 DIAGNOSIS — M791 Myalgia, unspecified site: Secondary | ICD-10-CM | POA: Diagnosis not present

## 2017-12-06 DIAGNOSIS — R42 Dizziness and giddiness: Secondary | ICD-10-CM | POA: Diagnosis not present

## 2017-12-06 DIAGNOSIS — G43909 Migraine, unspecified, not intractable, without status migrainosus: Secondary | ICD-10-CM | POA: Diagnosis not present

## 2017-12-06 LAB — COMPREHENSIVE METABOLIC PANEL
ALT: 219 U/L — AB (ref 14–54)
AST: 185 U/L — AB (ref 15–41)
Albumin: 3.5 g/dL (ref 3.5–5.0)
Alkaline Phosphatase: 65 U/L (ref 38–126)
Anion gap: 8 (ref 5–15)
BUN: 13 mg/dL (ref 6–20)
CHLORIDE: 107 mmol/L (ref 101–111)
CO2: 22 mmol/L (ref 22–32)
CREATININE: 0.52 mg/dL (ref 0.44–1.00)
Calcium: 8.7 mg/dL — ABNORMAL LOW (ref 8.9–10.3)
GFR calc Af Amer: >60 mL/min (ref >60)
GFR calc non Af Amer: >60 mL/min (ref >60)
GLUCOSE: 100 mg/dL — AB (ref 65–99)
Potassium: 3.7 mmol/L (ref 3.5–5.1)
Sodium: 137 mmol/L (ref 135–145)
Total Bilirubin: 0.7 mg/dL (ref 0.3–1.2)
Total Protein: 6.6 g/dL (ref 6.5–8.1)

## 2017-12-06 LAB — URINALYSIS, ROUTINE W REFLEX MICROSCOPIC
Bacteria, UA: NONE SEEN
Bilirubin Urine: NEGATIVE
Glucose, UA: NEGATIVE mg/dL
KETONES UR: 5 mg/dL — AB
Leukocytes, UA: NEGATIVE
Nitrite: NEGATIVE
PH: 5 (ref 5.0–8.0)
Protein, ur: NEGATIVE mg/dL
SPECIFIC GRAVITY, URINE: 1.028 (ref 1.005–1.030)

## 2017-12-06 LAB — CBC
HCT: 44.5 % (ref 36.0–46.0)
Hemoglobin: 15.3 g/dL — ABNORMAL HIGH (ref 12.0–15.0)
MCH: 34 pg (ref 26.0–34.0)
MCHC: 34.4 g/dL (ref 30.0–36.0)
MCV: 98.9 fL (ref 78.0–100.0)
PLATELETS: 187 10*3/uL (ref 150–400)
RBC: 4.5 MIL/uL (ref 3.87–5.11)
RDW: 12.6 % (ref 11.5–15.5)
WBC: 10 10*3/uL (ref 4.0–10.5)

## 2017-12-06 LAB — I-STAT BETA HCG BLOOD, ED (MC, WL, AP ONLY): I-stat hCG, quantitative: <5 m[IU]/mL (ref <5)

## 2017-12-06 LAB — LIPASE, BLOOD: LIPASE: 31 U/L (ref 11–51)

## 2017-12-06 MED ORDER — DIPHENHYDRAMINE HCL 50 MG/ML IJ SOLN
25.0000 mg | Freq: Once | INTRAMUSCULAR | Status: AC
  Administered 2017-12-06: 25 mg via INTRAMUSCULAR
  Filled 2017-12-06: qty 1

## 2017-12-06 MED ORDER — DEXAMETHASONE 4 MG PO TABS
10.0000 mg | ORAL_TABLET | Freq: Once | ORAL | Status: AC
  Administered 2017-12-06: 10 mg via ORAL
  Filled 2017-12-06: qty 3

## 2017-12-06 MED ORDER — OXYCODONE-ACETAMINOPHEN 5-325 MG PO TABS
1.0000 | ORAL_TABLET | ORAL | Status: DC | PRN
  Administered 2017-12-06: 1 via ORAL
  Filled 2017-12-06: qty 1

## 2017-12-06 MED ORDER — ACETAMINOPHEN 325 MG PO TABS
650.0000 mg | ORAL_TABLET | Freq: Once | ORAL | Status: AC
  Administered 2017-12-06: 650 mg via ORAL
  Filled 2017-12-06: qty 2

## 2017-12-06 MED ORDER — PROCHLORPERAZINE EDISYLATE 5 MG/ML IJ SOLN
10.0000 mg | Freq: Once | INTRAMUSCULAR | Status: AC
  Administered 2017-12-06: 10 mg via INTRAMUSCULAR
  Filled 2017-12-06: qty 2

## 2017-12-06 MED ORDER — ONDANSETRON 4 MG PO TBDP
4.0000 mg | ORAL_TABLET | Freq: Once | ORAL | Status: AC | PRN
  Administered 2017-12-06: 4 mg via ORAL
  Filled 2017-12-06: qty 1

## 2017-12-06 NOTE — ED Triage Notes (Addendum)
Reports having sudden onset of headache on Saturday along with dizziness.  Hx of migraines but not for years.  Also c/o nausea/vomiting X 2.  Took ibuprofen around 10pm.  Also c/o left thigh pain.

## 2017-12-06 NOTE — Discharge Instructions (Signed)
Take 4 over the counter ibuprofen tablets 3 times a day or 2 over-the-counter naproxen tablets twice a day for pain. Also take tylenol 1000mg(2 extra strength) four times a day.    

## 2017-12-06 NOTE — ED Notes (Signed)
Pt unable to void at this time. 

## 2017-12-06 NOTE — ED Provider Notes (Addendum)
[facility] EMERGENCY DEPARTMENT Provider Note   CSN: [id number] Arrival date & time: 12/06/17  0056     History   Chief Complaint Chief Complaint  Patient presents with  . Headache  . Dizziness    HPI Susan Hogan is a 56 y.o. female.  57 yo F with a chief complaint of a headache.  The patient has a history of posttraumatic migraines.  Has not had them in the past year or so.  Feels the same but more severe in intensity.  Going on since 2 days ago.  Slowly worsening.  Sometimes will be in the occiput but mostly is in the region between the eyes.  Worse with bright lights and loud noises.  Has had some nausea and vomiting with this.  When her headache is severe she feels that the room is spinning.  Vertigo is not positional.  Denies that she is moving her arms or legs.  Denies difficulty with speech.  She denies recent traumatic injury.  Denies fevers cough or congestion.  She has tried ibuprofen with minimal relief.   The history is provided by the patient.  Headache   This is a recurrent problem. The current episode started 2 days ago. The problem occurs constantly. The problem has been gradually worsening. The headache is associated with bright light and loud noise. The pain is located in the frontal region. The quality of the pain is described as sharp. The pain is at a severity of 10/10. The pain is severe. Radiates to: occiput. Pertinent negatives include no fever, no palpitations, no shortness of breath, no nausea and no vomiting. She has tried NSAIDs and resting in a darkened room for the symptoms. The treatment provided mild relief.  Dizziness  Associated symptoms: headaches   Associated symptoms: no chest pain, no nausea, no palpitations, no shortness of breath and no vomiting     Past Medical History:  Diagnosis Date  . Anemia   . Arthritis    RA "states does not see Rheumatologist"  . Asthma   . Carpal tunnel syndrome    bilaterally  .  Complication of anesthesia    difficulty breathing  . Degenerative disk disease    back  . Depression   . Diabetes mellitus   . Fibromyalgia   . GERD (gastroesophageal reflux disease)   . Headache(784.0)    migraines  . Heart murmur   . Hepatitis    In followup treatment for hepatitis C  . Hypertension    no medication for at this time, sees Dr. Estevan Oaks Pcp  . Hypotensive episode    hx of  . Irritable bowel syndrome   . Kidney stone    with hematuria  . Kidney stones    with hematuria  . Recurrent upper respiratory infection (URI)   . Shortness of breath   . Syncope     Patient Active Problem List   Diagnosis Date Noted  . COPD (chronic obstructive pulmonary disease) (HCC) 10/22/2016  . Syncope 10/05/2014  . Type 2 diabetes mellitus without complication, without long-term current use of insulin (HCC) 07/25/2014  . Fibromyalgia 07/25/2014  . MDD (major depressive disorder), recurrent severe, without psychosis (HCC) 03/17/2013  . HEPATITIS C 08/02/2007  . IRRITABLE BOWEL SYNDROME 08/02/2007  . DYSPNEA ON EXERTION 08/02/2007    Past Surgical History:  Procedure Laterality Date  . DILATION AND CURETTAGE OF UTERUS    . LIVER BIOPSY    . TONSILLECTOMY    . TUBAL  LIGATION      OB History    No data available       Home Medications    Prior to Admission medications   Medication Sig Start Date End Date Taking? Authorizing Provider  albuterol (PROVENTIL HFA;VENTOLIN HFA) 108 (90 Base) MCG/ACT inhaler Inhale 2 puffs into the lungs every 6 (six) hours as needed for wheezing or shortness of breath. 10/22/16   [place], Betty G, MD  butalbital-acetaminophen-caffeine (FIORICET WITH CODEINE) [phone] MG capsule Take 1 capsule by mouth every 6 (six) hours as needed for headache.    [provider]  dicyclomine (BENTYL) 20 MG tablet Take 20 mg by mouth 2 (two) times daily.    [provider]  ferrous sulfate 325 (65 FE) MG tablet Take 325 mg by  mouth daily with breakfast.    [provider]  glucose blood (ACCU-CHEK ACTIVE STRIPS) test strip Use as instructed 04/15/16   Barrett Henle, PA-C  Multiple Vitamins-Minerals (HAIR/SKIN/NAILS) TABS Take 1 tablet by mouth daily.    [provider]  ondansetron (ZOFRAN) 4 MG tablet Take 1 tablet (4 mg total) by mouth every 8 (eight) hours as needed for nausea or vomiting. 08/11/16   Pisciotta, Joni Reining, PA-C    Family History Family History  Problem Relation Age of Onset  . Heart disease Mother   . Diabetes Mother   . Asthma Mother   . Anemia Mother   . Colon cancer Maternal Uncle   . Heart disease Maternal Uncle   . Heart disease Maternal Grandmother   . Diabetes Maternal Grandmother   . Asthma Maternal Grandmother     Social History Social History   Tobacco Use  . Smoking status: Never Smoker  . Smokeless tobacco: Never Used  Substance Use Topics  . Alcohol use: No    Alcohol/week: 0.0 oz  . Drug use: No     Allergies   Anesthetic ether [ether]; Nubain [nalbuphine hcl]; Penicillins; Advair diskus [fluticasone-salmeterol]; and Lactose intolerance (gi)   Review of Systems Review of Systems  Constitutional: Negative for chills and fever.  HENT: Negative for congestion and rhinorrhea.   Eyes: Negative for redness and visual disturbance.  Respiratory: Negative for shortness of breath and wheezing.   Cardiovascular: Negative for chest pain and palpitations.  Gastrointestinal: Negative for nausea and vomiting.  Genitourinary: Negative for dysuria and urgency.  Musculoskeletal: Positive for myalgias. Negative for arthralgias.  Skin: Negative for pallor and wound.  Neurological: Positive for dizziness and headaches.     Physical Exam Updated Vital Signs BP 124/75   Pulse 62   Temp 98.2 F (36.8 C) (Oral)   Resp 18   Ht 5' (1.524 m)   Wt 98.9 kg (218 lb)   SpO2 95%   BMI 42.58 kg/m   Physical Exam  Constitutional: She is oriented to  person, place, and time. She appears well-developed and well-nourished. No distress.  HENT:  Head: Normocephalic and atraumatic.  Eyes: EOM are normal. Pupils are equal, round, and reactive to light.  Neck: Normal range of motion. Neck supple.  Cardiovascular: Normal rate and regular rhythm. Exam reveals no gallop and no friction rub.  No murmur heard. Pulmonary/Chest: Effort normal. She has no wheezes. She has no rales.  Abdominal: Soft. She exhibits no distension. There is no tenderness.  Musculoskeletal: She exhibits no edema or tenderness.  Neurological: She is alert and oriented to person, place, and time. She has normal strength. No cranial nerve deficit or sensory deficit. She displays  a negative Romberg sign. Coordination and gait normal. GCS eye subscore is 4. GCS verbal subscore is 5. GCS motor subscore is 6.  Skin: Skin is warm and dry. She is not diaphoretic.  Psychiatric: She has a normal mood and affect. Her behavior is normal.  Nursing note and vitals reviewed.    ED Treatments / Results  Labs (all labs ordered are listed, but only abnormal results are displayed) Labs Reviewed  COMPREHENSIVE METABOLIC PANEL - Abnormal; Notable for the following components:      Result Value   Glucose, Bld 100 (*)    Calcium 8.7 (*)    AST 185 (*)    ALT 219 (*)    All other components within normal limits  CBC - Abnormal; Notable for the following components:   Hemoglobin 15.3 (*)    All other components within normal limits  URINALYSIS, ROUTINE W REFLEX MICROSCOPIC - Abnormal; Notable for the following components:   Hgb urine dipstick SMALL (*)    Ketones, ur 5 (*)    Squamous Epithelial / LPF 0-5 (*)    All other components within normal limits  LIPASE, BLOOD  I-STAT BETA HCG BLOOD, ED ([facility], [facility], [facility] ONLY)    EKG  EKG Interpretation  Date/Time:  Monday December 06 2017 01:59:22 EDT Ventricular Rate:  63 PR Interval:  150 QRS Duration: 86 QT Interval:  420 QTC  Calculation: 429 R Axis:   -2 Text Interpretation:  Normal sinus rhythm Cannot rule out Anterior infarct , age undetermined Abnormal ECG No significant change since last tracing Confirmed by Melene Plan [phone]) on 12/06/2017 8:46:45 AM       Radiology No results found.  Procedures Procedures (including critical care time)  Medications Ordered in ED Medications  oxyCODONE-acetaminophen (PERCOCET/ROXICET) 5-325 MG per tablet 1 tablet (1 tablet Oral Given 12/06/17 0152)  ondansetron (ZOFRAN-ODT) disintegrating tablet 4 mg (4 mg Oral Given 12/06/17 0152)  prochlorperazine (COMPAZINE) injection 10 mg (10 mg Intramuscular Given 12/06/17 0735)  diphenhydrAMINE (BENADRYL) injection 25 mg (25 mg Intramuscular Given 12/06/17 0735)  acetaminophen (TYLENOL) tablet 650 mg (650 mg Oral Given 12/06/17 0734)  dexamethasone (DECADRON) tablet 10 mg (10 mg Oral Given 12/06/17 0734)     Initial Impression / Assessment and Plan / ED Course  I have reviewed the triage vital signs and the nursing notes.  Pertinent labs & imaging results that were available during my care of the patient were reviewed by me and considered in my medical decision making (see chart for details).     57 yo F with a chief complaint of a headache.  There are no red flags.  Patient is well-appearing and nontoxic.  She has a benign neurologic exam.  Will give a headache cocktail and reassess.  Feeling much better on reassessment.  D/c home.   8:34 AM:  I have discussed the diagnosis/risks/treatment options with the patient and family and believe the pt to be eligible for discharge home to follow-up with PCP. We also discussed returning to the ED immediately if new or worsening sx occur. We discussed the sx which are most concerning (e.g., sudden worsening pain, fever, inability to tolerate by mouth) that necessitate immediate return. Medications administered to the patient during their visit and any new prescriptions provided to the  patient are listed below.  Medications given during this visit Medications  oxyCODONE-acetaminophen (PERCOCET/ROXICET) 5-325 MG per tablet 1 tablet (1 tablet Oral Given 12/06/17 0152)  ondansetron (ZOFRAN-ODT) disintegrating tablet 4 mg (4 mg Oral  Given 12/06/17 0152)  prochlorperazine (COMPAZINE) injection 10 mg (10 mg Intramuscular Given 12/06/17 0735)  diphenhydrAMINE (BENADRYL) injection 25 mg (25 mg Intramuscular Given 12/06/17 0735)  acetaminophen (TYLENOL) tablet 650 mg (650 mg Oral Given 12/06/17 0734)  dexamethasone (DECADRON) tablet 10 mg (10 mg Oral Given 12/06/17 0734)     The patient appears reasonably screen and/or stabilized for discharge and I doubt any other medical condition or other [facility] requiring further screening, evaluation, or treatment in the ED at this time prior to discharge.    Final Clinical Impressions(s) / ED Diagnoses   Final diagnoses:  Occipital headache  Leg pain, anterior, left    ED Discharge Orders        Ordered    Ambulatory referral to Neurology    Comments:  recurrent headache syndrome   12/06/17 0833          Melene Plan, DO 12/06/17 [phone]

## 2017-12-06 NOTE — ED Notes (Signed)
Ordered percocet for pain.  Reports taking it in the past with no issues.

## 2018-01-13 DIAGNOSIS — J4520 Mild intermittent asthma, uncomplicated: Secondary | ICD-10-CM | POA: Diagnosis not present

## 2018-01-13 DIAGNOSIS — J449 Chronic obstructive pulmonary disease, unspecified: Secondary | ICD-10-CM | POA: Diagnosis not present

## 2018-01-13 DIAGNOSIS — E782 Mixed hyperlipidemia: Secondary | ICD-10-CM | POA: Diagnosis not present

## 2018-01-13 DIAGNOSIS — E119 Type 2 diabetes mellitus without complications: Secondary | ICD-10-CM | POA: Diagnosis not present

## 2018-01-27 DIAGNOSIS — J4520 Mild intermittent asthma, uncomplicated: Secondary | ICD-10-CM | POA: Diagnosis not present

## 2018-01-27 DIAGNOSIS — E119 Type 2 diabetes mellitus without complications: Secondary | ICD-10-CM | POA: Diagnosis not present

## 2018-01-27 DIAGNOSIS — J301 Allergic rhinitis due to pollen: Secondary | ICD-10-CM | POA: Diagnosis not present

## 2018-01-27 DIAGNOSIS — J449 Chronic obstructive pulmonary disease, unspecified: Secondary | ICD-10-CM | POA: Diagnosis not present

## 2018-01-27 DIAGNOSIS — E782 Mixed hyperlipidemia: Secondary | ICD-10-CM | POA: Diagnosis not present

## 2018-02-07 ENCOUNTER — Ambulatory Visit: Payer: Self-pay | Admitting: Neurology

## 2018-02-15 DIAGNOSIS — E119 Type 2 diabetes mellitus without complications: Secondary | ICD-10-CM | POA: Diagnosis not present

## 2018-02-15 DIAGNOSIS — J4520 Mild intermittent asthma, uncomplicated: Secondary | ICD-10-CM | POA: Diagnosis not present

## 2018-02-15 DIAGNOSIS — M069 Rheumatoid arthritis, unspecified: Secondary | ICD-10-CM | POA: Diagnosis not present

## 2018-02-15 DIAGNOSIS — E782 Mixed hyperlipidemia: Secondary | ICD-10-CM | POA: Diagnosis not present

## 2018-02-15 DIAGNOSIS — R5383 Other fatigue: Secondary | ICD-10-CM | POA: Diagnosis not present

## 2018-03-03 ENCOUNTER — Emergency Department (HOSPITAL_COMMUNITY)
Admission: EM | Admit: 2018-03-03 | Discharge: 2018-03-03 | Disposition: A | Discharge status: Home / Self Care | Payer: Medicare Other | Attending: Emergency Medicine | Admitting: Emergency Medicine

## 2018-03-03 ENCOUNTER — Emergency Department (HOSPITAL_COMMUNITY): Payer: Medicare Other

## 2018-03-03 ENCOUNTER — Encounter (HOSPITAL_COMMUNITY): Payer: Self-pay

## 2018-03-03 DIAGNOSIS — E119 Type 2 diabetes mellitus without complications: Secondary | ICD-10-CM | POA: Insufficient documentation

## 2018-03-03 DIAGNOSIS — Z79899 Other long term (current) drug therapy: Secondary | ICD-10-CM | POA: Diagnosis not present

## 2018-03-03 DIAGNOSIS — J069 Acute upper respiratory infection, unspecified: Secondary | ICD-10-CM | POA: Diagnosis not present

## 2018-03-03 DIAGNOSIS — I10 Essential (primary) hypertension: Secondary | ICD-10-CM | POA: Diagnosis not present

## 2018-03-03 DIAGNOSIS — R05 Cough: Secondary | ICD-10-CM | POA: Diagnosis not present

## 2018-03-03 DIAGNOSIS — R0602 Shortness of breath: Secondary | ICD-10-CM | POA: Diagnosis not present

## 2018-03-03 DIAGNOSIS — R072 Precordial pain: Secondary | ICD-10-CM | POA: Diagnosis not present

## 2018-03-03 DIAGNOSIS — J45909 Unspecified asthma, uncomplicated: Secondary | ICD-10-CM | POA: Insufficient documentation

## 2018-03-03 LAB — I-STAT CHEM 8, ED
BUN: 14 mg/dL (ref 6–20)
CREATININE: 0.7 mg/dL (ref 0.44–1.00)
Calcium, Ion: 1.27 mmol/L (ref 1.15–1.40)
Chloride: 103 mmol/L (ref 101–111)
GLUCOSE: 105 mg/dL — AB (ref 65–99)
HCT: 43 % (ref 36.0–46.0)
HEMOGLOBIN: 14.6 g/dL (ref 12.0–15.0)
POTASSIUM: 3.9 mmol/L (ref 3.5–5.1)
Sodium: 142 mmol/L (ref 135–145)
TCO2: 26 mmol/L (ref 22–32)

## 2018-03-03 LAB — I-STAT TROPONIN, ED: Troponin i, poc: 0 ng/mL (ref 0.00–0.08)

## 2018-03-03 LAB — CBG MONITORING, ED: GLUCOSE-CAPILLARY: 111 mg/dL — AB (ref 65–99)

## 2018-03-03 MED ORDER — ALBUTEROL SULFATE HFA 108 (90 BASE) MCG/ACT IN AERS: 1.0000 | INHALATION_SPRAY | Freq: Four times a day (QID) | RESPIRATORY_TRACT | 0 refills | Status: DC | PRN

## 2018-03-03 MED ORDER — PREDNISONE 20 MG PO TABS: 40.0000 mg | ORAL_TABLET | Freq: Every day | ORAL | 0 refills | Status: AC

## 2018-03-03 MED ORDER — ALBUTEROL SULFATE (2.5 MG/3ML) 0.083% IN NEBU
2.5000 mg | INHALATION_SOLUTION | Freq: Once | RESPIRATORY_TRACT | Status: AC
  Administered 2018-03-03: 2.5 mg via RESPIRATORY_TRACT
  Filled 2018-03-03: qty 3

## 2018-03-03 MED ORDER — PREDNISONE 20 MG PO TABS: 40.0000 mg | ORAL_TABLET | Freq: Every day | ORAL | 0 refills | Status: DC

## 2018-03-03 NOTE — Discharge Instructions (Addendum)

## 2018-03-03 NOTE — ED Notes (Signed)
See EDP assessment / note.   

## 2018-03-03 NOTE — ED Triage Notes (Signed)
Patient complains of 1 week of URI symptoms and her MD put her on steroids. States that the cough is worse at night with dry cough, alert and oriernted, NAD

## 2018-03-03 NOTE — ED Notes (Signed)
Pt ambulated in hallway by RN. SPO2 stayed at 100%

## 2018-03-03 NOTE — ED Provider Notes (Signed)
[facility] EMERGENCY DEPARTMENT Provider Note   CSN: [id number] Arrival date & time: 03/03/18  1214     History   Chief Complaint Chief Complaint  Patient presents with  . cough/runny nose    HPI Susan Hogan is a 57 y.o. female.  HPI   Pt is a 30 /o female with a h/o COPD, asthma, GERD, T2DM, who presents to the ED c/o a cough that has been ongoing for the last month. States cough is a dry cough. States cough is worse when she is cold. She also reports rhinorrhea, congestion, sneezing. Denies sore throat. Reports left ear pain for 1 day. She reports some shortness of breath and constant bilat anterior substernal chest pain that has been constant for the last 2 days. Rates pain as 10/10. Radiates to her back. States pain is worsening. She also reports DOE and shortness of breath when she is talking. States has h/o this and states similar to previous. She denies abd pain, vomiting, diarrhea, constipation, urinary sxs. She reports chills, sweats, but no documented fevers. Denies a h/o tobacco use. She also reports bilat lower extremity swelling that has been intermittent over the last month. No unilateral swelling or calf pain.  No recent hospital admissions.  No recent surgeries.  No recent periods of immobility.  No hemoptysis.  No history of cancer, DVT, PE.  Not on estrogen therapy.  Pt states she seen by her PCP about these symptoms and was recently started on Brio, symbicort, prednisone.   Past Medical History:  Diagnosis Date  . Anemia   . Arthritis    RA "states does not see Rheumatologist"  . Asthma   . Carpal tunnel syndrome    bilaterally  . Complication of anesthesia    difficulty breathing  . Degenerative disk disease    back  . Depression   . Diabetes mellitus   . Fibromyalgia   . GERD (gastroesophageal reflux disease)   . Headache(784.0)    migraines  . Heart murmur   . Hepatitis    In followup treatment for hepatitis C  . Hypertension      no medication for at this time, sees Dr. Estevan Oaks Pcp  . Hypotensive episode    hx of  . Irritable bowel syndrome   . Kidney stone    with hematuria  . Kidney stones    with hematuria  . Recurrent upper respiratory infection (URI)   . Shortness of breath   . Syncope     Patient Active Problem List   Diagnosis Date Noted  . COPD (chronic obstructive pulmonary disease) (HCC) 10/22/2016  . Syncope 10/05/2014  . Type 2 diabetes mellitus without complication, without long-term current use of insulin (HCC) 07/25/2014  . Fibromyalgia 07/25/2014  . MDD (major depressive disorder), recurrent severe, without psychosis (HCC) 03/17/2013  . HEPATITIS C 08/02/2007  . IRRITABLE BOWEL SYNDROME 08/02/2007  . DYSPNEA ON EXERTION 08/02/2007    Past Surgical History:  Procedure Laterality Date  . DILATION AND CURETTAGE OF UTERUS    . LIVER BIOPSY    . TONSILLECTOMY    . TUBAL LIGATION       OB History   None      Home Medications    Prior to Admission medications   Medication Sig Start Date End Date Taking? Authorizing Provider  albuterol (PROVENTIL HFA;VENTOLIN HFA) 108 (90 Base) MCG/ACT inhaler Inhale 1-2 puffs into the lungs every 6 (six) hours as needed for wheezing or shortness of  breath. 03/03/18   Argie Applegate S, PA-C  butalbital-acetaminophen-caffeine (FIORICET WITH CODEINE) 50-325-40-30 MG capsule Take 1 capsule by mouth every 6 (six) hours as needed for headache.    [provider]  dicyclomine (BENTYL) 20 MG tablet Take 20 mg by mouth 2 (two) times daily.    [provider]  ferrous sulfate 325 (65 FE) MG tablet Take 325 mg by mouth daily with breakfast.    [provider]  glucose blood (ACCU-CHEK ACTIVE STRIPS) test strip Use as instructed 04/15/16   Barrett Henle, PA-C  Multiple Vitamins-Minerals (HAIR/SKIN/NAILS) TABS Take 1 tablet by mouth daily.    [provider]  ondansetron (ZOFRAN) 4 MG tablet Take 1 tablet  (4 mg total) by mouth every 8 (eight) hours as needed for nausea or vomiting. 08/11/16   Pisciotta, Joni Reining, PA-C  predniSONE (DELTASONE) 20 MG tablet Take 2 tablets (40 mg total) by mouth daily for 5 days. 03/03/18 03/08/18  Zaharah Amir S, PA-C    Family History Family History  Problem Relation Age of Onset  . Heart disease Mother   . Diabetes Mother   . Asthma Mother   . Anemia Mother   . Colon cancer Maternal Uncle   . Heart disease Maternal Uncle   . Heart disease Maternal Grandmother   . Diabetes Maternal Grandmother   . Asthma Maternal Grandmother     Social History Social History   Tobacco Use  . Smoking status: Never Smoker  . Smokeless tobacco: Never Used  Substance Use Topics  . Alcohol use: No    Alcohol/week: 0.0 oz  . Drug use: No     Allergies   Anesthetic ether [ether]; Nubain [nalbuphine hcl]; Penicillins; Advair diskus [fluticasone-salmeterol]; and Lactose intolerance (gi)   Review of Systems Review of Systems  Constitutional: Positive for chills. Negative for fever.  HENT: Positive for congestion, ear pain, postnasal drip, rhinorrhea and sneezing. Negative for sore throat.   Eyes: Negative for visual disturbance.  Respiratory: Positive for cough and shortness of breath. Negative for wheezing.   Cardiovascular: Positive for chest pain and leg swelling.  Gastrointestinal: Negative for abdominal pain, constipation, diarrhea, nausea and vomiting.  Genitourinary: Negative for dysuria, flank pain, hematuria and urgency.  Musculoskeletal: Negative for back pain.  Skin: Negative for rash.  Neurological: Negative for dizziness, weakness, light-headedness, numbness and headaches.    Physical Exam Updated Vital Signs BP 115/61   Pulse 72   Temp 97.6 F (36.4 C) (Oral)   Resp 16   SpO2 100%   Physical Exam  Constitutional: She is oriented to person, place, and time. She appears well-developed and well-nourished. No distress.  HENT:  Head:  Normocephalic and atraumatic.  Bilateral TMs are bulging without erythema or effusion behind them.  No pharyngeal erythema.  No tonsillar swelling or exudate.  Nose normal.  Eyes: Pupils are equal, round, and reactive to light. Conjunctivae and EOM are normal.  Neck: Neck supple.  Cardiovascular: Normal rate, regular rhythm, normal heart sounds and intact distal pulses.  No murmur heard. Pulmonary/Chest: Effort normal. No stridor. No respiratory distress. She has no wheezes.  Wet cough on exam.  Decreased breath sounds throughout. bilat anterior chest wall TTP.   Abdominal: Soft. Bowel sounds are normal. She exhibits no distension. There is no tenderness. There is no guarding.  Musculoskeletal: She exhibits no edema.  No calf TTP, erythema, swelling. Pt has no lower extremity edema.   Lymphadenopathy:    She has no cervical adenopathy.  Neurological:  She is alert and oriented to person, place, and time.  Skin: Skin is warm and dry. Capillary refill takes less than 2 seconds.  Psychiatric: She has a normal mood and affect.  Nursing note and vitals reviewed.    ED Treatments / Results  Labs (all labs ordered are listed, but only abnormal results are displayed) Labs Reviewed  CBG MONITORING, ED - Abnormal; Notable for the following components:      Result Value   Glucose-Capillary 111 (*)    All other components within normal limits  I-STAT CHEM 8, ED - Abnormal; Notable for the following components:   Glucose, Bld 105 (*)    All other components within normal limits  I-STAT TROPONIN, ED    EKG EKG Interpretation  Date/Time:  Thursday March 03 2018 14:02:32 EDT Ventricular Rate:  57 PR Interval:  152 QRS Duration: 88 QT Interval:  430 QTC Calculation: 418 R Axis:   12 Text Interpretation:  Sinus bradycardia Cannot rule out Anterior infarct , age undetermined Abnormal ECG T wave flip aVF and V3 compared to old. Confirmed by Arby Barrette [phone]) on 03/03/2018 2:20:29  PM   Radiology Dg Chest 2 View  Result Date: 03/03/2018 CLINICAL DATA:  Nonproductive cough EXAM: CHEST - 2 VIEW COMPARISON:  None. FINDINGS: The heart size and mediastinal contours are within normal limits. Both lungs are clear. The visualized skeletal structures are unremarkable. IMPRESSION: No active cardiopulmonary disease. Electronically Signed   By: Alcide Clever M.D.   On: 03/03/2018 13:05    Procedures Procedures (including critical care time)  Medications Ordered in ED Medications  albuterol (PROVENTIL) (2.5 MG/3ML) 0.083% nebulizer solution 2.5 mg (2.5 mg Nebulization Given 03/03/18 1457)     Initial Impression / Assessment and Plan / ED Course  I have reviewed the triage vital signs and the nursing notes.  Pertinent labs & imaging results that were available during my care of the patient were reviewed by me and considered in my medical decision making (see chart for details).   Rechecked patient.  She is sleeping in the chair in no acute distress.  She states she is not feeling short of breath or having any chest pain.  She is requesting a nebulizer treatment she states she is not allergic to albuterol.  Rechecked patient after nebulizer treatment.  She states she feels much improved.  States she does not have an albuterol inhaler at home but states she needs one.  States that she think she has an upper respiratory infection.  Denies any chest pain or shortness of breath currently.  Repeat pulmonary exam reveals good airflow throughout with no wheezing, rales or rhonchi. Pt ambulated with nursing staff and maintained sats to 100% on RA.  Dr. Madilyn Hook reviewed the patient's EKG and feels that there are no significant EKG changes compared to prior. Does not feel that patient needs to have delta trop.    Final Clinical Impressions(s) / ED Diagnoses   Final diagnoses:  Upper respiratory tract infection, unspecified type   Pt CXR negative for acute infiltrate. Patients symptoms are  consistent with URI, likely viral etiology. Chest pain reproducible with palpation of chest wall on exam. Denied pain on re-evaluation before and after neb tx. Suspect pain due to chronic cough for the last month. Doubt PE, pt denies risk factors. Has normal VS and maintained sats while ambulation at 100% on RA. She also has a documented h/o dyspnea on exertion which she states is at baseline today.  She feels  improved after duo neb and she is CTAB with good air exchange after neb tx. Doubt acs. Trop negative. Discussed ECG findings with Dr. Madilyn Hook who reviewed ECG and also agrees with plan for d/c. Pt has no LE edema and doubt CHF. No cardiomegaly on CXR. No widened mediastinum to suggest AAA. Labs reviewed and WNL.   Discussed that antibiotics are not indicated for viral infections. Pt will be discharged with symptomatic treatment.  Verbalizes understanding and is agreeable with plan. Pt is hemodynamically stable & in NAD prior to dc.  ED Discharge Orders        Ordered    albuterol (PROVENTIL HFA;VENTOLIN HFA) 108 (90 Base) MCG/ACT inhaler  Every 6 hours PRN,   Status:  Discontinued     03/03/18 1525    predniSONE (DELTASONE) 20 MG tablet  Daily,   Status:  Discontinued     03/03/18 1525    albuterol (PROVENTIL HFA;VENTOLIN HFA) 108 (90 Base) MCG/ACT inhaler  Every 6 hours PRN     03/03/18 1530    predniSONE (DELTASONE) 20 MG tablet  Daily     03/03/18 1530       Dakwan Pridgen, [place], PA-C 03/03/18 1746    Tilden Fossa, MD 03/07/18 1147

## 2018-03-07 DIAGNOSIS — E119 Type 2 diabetes mellitus without complications: Secondary | ICD-10-CM | POA: Diagnosis not present

## 2018-03-07 DIAGNOSIS — K589 Irritable bowel syndrome without diarrhea: Secondary | ICD-10-CM | POA: Diagnosis not present

## 2018-03-07 DIAGNOSIS — J449 Chronic obstructive pulmonary disease, unspecified: Secondary | ICD-10-CM | POA: Diagnosis not present

## 2018-03-07 DIAGNOSIS — J4520 Mild intermittent asthma, uncomplicated: Secondary | ICD-10-CM | POA: Diagnosis not present

## 2018-03-08 ENCOUNTER — Telehealth: Payer: Self-pay | Admitting: *Deleted

## 2018-03-08 NOTE — Telephone Encounter (Signed)
Please advise Dr [place] if you agree in patient transferring care to [facility].

## 2018-03-08 NOTE — Telephone Encounter (Signed)
It is ok with me.  

## 2018-03-08 NOTE — Telephone Encounter (Signed)
Copied from CRM [id number]. Topic: Appointment Scheduling - Scheduling Inquiry for Clinic >> Mar 08, 2018 12:24 PM Luanna Cole wrote: Reason for CRM: pt is established with betty Swaziland and had an appointment 10/22/16 and would like to transfer care to [facility] [place] [facility]. Pt has medicare/medicaid. Is this okay with both providers?  Cb#[phone]

## 2018-03-09 ENCOUNTER — Encounter (HOSPITAL_COMMUNITY): Payer: Self-pay | Admitting: Emergency Medicine

## 2018-03-09 ENCOUNTER — Emergency Department (HOSPITAL_COMMUNITY): Payer: Medicare Other

## 2018-03-09 ENCOUNTER — Emergency Department (HOSPITAL_COMMUNITY)
Admission: EM | Admit: 2018-03-09 | Discharge: 2018-03-09 | Disposition: A | Discharge status: Home / Self Care | Payer: Medicare Other | Attending: Emergency Medicine | Admitting: Emergency Medicine

## 2018-03-09 DIAGNOSIS — J209 Acute bronchitis, unspecified: Secondary | ICD-10-CM | POA: Insufficient documentation

## 2018-03-09 DIAGNOSIS — I10 Essential (primary) hypertension: Secondary | ICD-10-CM | POA: Diagnosis not present

## 2018-03-09 DIAGNOSIS — J449 Chronic obstructive pulmonary disease, unspecified: Secondary | ICD-10-CM | POA: Insufficient documentation

## 2018-03-09 DIAGNOSIS — E119 Type 2 diabetes mellitus without complications: Secondary | ICD-10-CM | POA: Diagnosis not present

## 2018-03-09 DIAGNOSIS — R05 Cough: Secondary | ICD-10-CM | POA: Diagnosis not present

## 2018-03-09 DIAGNOSIS — R079 Chest pain, unspecified: Secondary | ICD-10-CM | POA: Diagnosis not present

## 2018-03-09 DIAGNOSIS — Z79899 Other long term (current) drug therapy: Secondary | ICD-10-CM | POA: Diagnosis not present

## 2018-03-09 LAB — CBG MONITORING, ED: Glucose-Capillary: 120 mg/dL — ABNORMAL HIGH (ref 65–99)

## 2018-03-09 MED ORDER — BENZONATATE 100 MG PO CAPS: 200.0000 mg | ORAL_CAPSULE | Freq: Three times a day (TID) | ORAL | 0 refills | Status: DC

## 2018-03-09 MED ORDER — PROMETHAZINE-DM 6.25-15 MG/5ML PO SYRP: 5.0000 mL | ORAL_SOLUTION | Freq: Every day | ORAL | 0 refills | Status: DC

## 2018-03-09 NOTE — ED Triage Notes (Signed)
Patient c/o dry cough x3 weeks. Reports seen at [facility] x3 days ago and dx with bronchitis. Reports taking prednisone and using inhaler without relief. Reports pain to bilateral ribs with coughing. Speaking in full sentences without difficulty.

## 2018-03-09 NOTE — Telephone Encounter (Signed)
Please advise if okay to transfer care.  Pt is requesting to transfer care to [facility] Dr. Helane Rima

## 2018-03-09 NOTE — ED Provider Notes (Signed)
[facility]-EMERGENCY DEPT Provider Note   CSN: [id number] Arrival date & time: 03/09/18  2044     History   Chief Complaint Chief Complaint  Patient presents with  . Cough    HPI Susan Hogan is a 57 y.o. female.  57 year old female presents with complaint of cough x1 month.  Patient reports history of asthma and diabetes, seen by her PCP 1 week ago for same (at that time cough, runny nose, mild sinus congestion) and given prednisone.  Patient went to [facility], ER for shortness of breath and was given a neb treatment as well as additional prednisone.  Patient states she continues to have a nonproductive and is concerned she may have pneumonia as she has had history of pneumonia previously.  She denies fevers, chills or sweats, sinus congestion or pressure, denies SHOB at this time.  Reports soreness in her back with coughing, left ear pain.  Has not checked her blood sugar lately.  No other complaints or concerns.     Past Medical History:  Diagnosis Date  . Anemia   . Arthritis    RA "states does not see Rheumatologist"  . Asthma   . Carpal tunnel syndrome    bilaterally  . Complication of anesthesia    difficulty breathing  . Degenerative disk disease    back  . Depression   . Diabetes mellitus   . Fibromyalgia   . GERD (gastroesophageal reflux disease)   . Headache(784.0)    migraines  . Heart murmur   . Hepatitis    In followup treatment for hepatitis C  . Hypertension    no medication for at this time, sees Dr. Estevan Oaks Pcp  . Hypotensive episode    hx of  . Irritable bowel syndrome   . Kidney stone    with hematuria  . Kidney stones    with hematuria  . Recurrent upper respiratory infection (URI)   . Shortness of breath   . Syncope     Patient Active Problem List   Diagnosis Date Noted  . COPD (chronic obstructive pulmonary disease) (HCC) 10/22/2016  . Syncope 10/05/2014  . Type 2 diabetes mellitus without  complication, without long-term current use of insulin (HCC) 07/25/2014  . Fibromyalgia 07/25/2014  . MDD (major depressive disorder), recurrent severe, without psychosis (HCC) 03/17/2013  . HEPATITIS C 08/02/2007  . IRRITABLE BOWEL SYNDROME 08/02/2007  . DYSPNEA ON EXERTION 08/02/2007    Past Surgical History:  Procedure Laterality Date  . DILATION AND CURETTAGE OF UTERUS    . LIVER BIOPSY    . TONSILLECTOMY    . TUBAL LIGATION       OB History   None      Home Medications    Prior to Admission medications   Medication Sig Start Date End Date Taking? Authorizing Provider  albuterol (PROVENTIL HFA;VENTOLIN HFA) 108 (90 Base) MCG/ACT inhaler Inhale 1-2 puffs into the lungs every 6 (six) hours as needed for wheezing or shortness of breath. 03/03/18  Yes Couture, Cortni S, PA-C  fluticasone (FLONASE) 50 MCG/ACT nasal spray Place 1 spray into both nostrils daily.   Yes [provider]  fluticasone furoate-vilanterol (BREO ELLIPTA) 200-25 MCG/INH AEPB Inhale 1 puff into the lungs daily. 11/09/17  Yes [provider]  VITAMIN B1-B12 IJ Inject 1 each as directed every 30 (thirty) days.   Yes [provider]  benzonatate (TESSALON) 100 MG capsule Take 2 capsules (200 mg total) by mouth every 8 (eight)  hours. 03/09/18   Jeannie Fend, PA-C  glucose blood (ACCU-CHEK ACTIVE STRIPS) test strip Use as instructed 04/15/16   Barrett Henle, PA-C  promethazine-dextromethorphan (PROMETHAZINE-DM) 6.25-15 MG/5ML syrup Take 5 mLs by mouth at bedtime. 03/09/18   Jeannie Fend, PA-C    Family History Family History  Problem Relation Age of Onset  . Heart disease Mother   . Diabetes Mother   . Asthma Mother   . Anemia Mother   . Colon cancer Maternal Uncle   . Heart disease Maternal Uncle   . Heart disease Maternal Grandmother   . Diabetes Maternal Grandmother   . Asthma Maternal Grandmother     Social History Social History   Tobacco Use  . Smoking  status: Never Smoker  . Smokeless tobacco: Never Used  Substance Use Topics  . Alcohol use: No    Alcohol/week: 0.0 oz  . Drug use: No     Allergies   Anesthetic ether [ether]; Nubain [nalbuphine hcl]; Penicillins; Advair diskus [fluticasone-salmeterol]; and Lactose intolerance (gi)   Review of Systems Review of Systems  Constitutional: Negative for chills, fatigue and fever.  HENT: Positive for ear pain. Negative for congestion, ear discharge, sinus pressure, sinus pain and sore throat.   Eyes: Negative for discharge and redness.  Respiratory: Positive for cough. Negative for shortness of breath.   Cardiovascular: Negative for chest pain.  Gastrointestinal: Negative for abdominal pain.  Skin: Negative for rash and wound.  Neurological: Negative for dizziness and weakness.  Hematological: Does not bruise/bleed easily.  Psychiatric/Behavioral: Negative for confusion.  All other systems reviewed and are negative.    Physical Exam Updated Vital Signs BP 132/62 (BP Location: Left Arm)   Pulse 76   Temp 98.7 F (37.1 C) (Oral)   Resp 20   SpO2 100%   Physical Exam  Constitutional: She is oriented to person, place, and time. She appears well-developed and well-nourished.  HENT:  Head: Normocephalic and atraumatic.  Right Ear: External ear normal. Tympanic membrane is not erythematous, not retracted and not bulging. No middle ear effusion.  Left Ear: External ear normal. Tympanic membrane is not erythematous, not retracted and not bulging. A middle ear effusion is present.  Nose: Nose normal.  Mouth/Throat: No oropharyngeal exudate.  Eyes: Conjunctivae are normal.  Neck: Neck supple.  Cardiovascular: Normal rate, regular rhythm, normal heart sounds and intact distal pulses.  No murmur heard. Pulmonary/Chest: Effort normal and breath sounds normal. No respiratory distress.  Abdominal: Soft. There is no tenderness.  Lymphadenopathy:    She has no cervical adenopathy.    Neurological: She is alert and oriented to person, place, and time.  Skin: Skin is warm and dry. No rash noted.  Psychiatric: She has a normal mood and affect.  Nursing note and vitals reviewed.    ED Treatments / Results  Labs (all labs ordered are listed, but only abnormal results are displayed) Labs Reviewed  CBG MONITORING, ED - Abnormal; Notable for the following components:      Result Value   Glucose-Capillary 120 (*)    All other components within normal limits    EKG None  Radiology Dg Chest 2 View  Result Date: 03/09/2018 CLINICAL DATA:  Cough and chest pain for 1 week. Recent diagnosis of bronchitis but not getting better. EXAM: CHEST - 2 VIEW COMPARISON:  03/03/2018 FINDINGS: Normal heart size and pulmonary vascularity. No focal airspace disease or consolidation in the lungs. No blunting of costophrenic angles. No pneumothorax. Mediastinal contours appear  intact. Surgical clips in the right upper quadrant. IMPRESSION: No evidence of active pulmonary disease. Electronically Signed   By: Burman Nieves M.D.   On: 03/09/2018 21:31    Procedures Procedures (including critical care time)  Medications Ordered in ED Medications - No data to display   Initial Impression / Assessment and Plan / ED Course  I have reviewed the triage vital signs and the nursing notes.  Pertinent labs & imaging results that were available during my care of the patient were reviewed by me and considered in my medical decision making (see chart for details).  Clinical Course as of Mar 09 2214  Wed Mar 09, 2018  [id number] 57 year old female presents with ongoing cough.  Patient was seen by her PCP as well as in the ER 1 week ago and given prednisone for same.  Patient states the sinus congestion and clear runny nose have resolved however she continues to have left ear pain as well as a nonproductive cough which keeps her up at night.  Patient has a history of asthma and diabetes, has been using  her inhaler.  On exam her lung sounds are clear, she does have a left ear effusion without acute otitis media.  Patient was concerned she may have pneumonia, her chest x-ray is clear and does not show pneumonia today.  Recommend patient continue with her albuterol inhaler, continue with Zyrtec and Flonase.  Patient given prescription for Tessalon for daytime cough as well as Phenergan DM for nighttime cough.  Recommend she recheck with her PCP.  Return to ER for worsening or concerning symptoms.   [LM]    Clinical Course User Index [LM] Jeannie Fend, PA-C    Final Clinical Impressions(s) / ED Diagnoses   Final diagnoses:  Acute bronchitis, unspecified organism    ED Discharge Orders        Ordered    benzonatate (TESSALON) 100 MG capsule  Every 8 hours     03/09/18 2213    promethazine-dextromethorphan (PROMETHAZINE-DM) 6.25-15 MG/5ML syrup  Daily at bedtime     03/09/18 2213       Jeannie Fend, PA-C 03/09/18 2215    Charlynne Pander, MD 03/09/18 2234

## 2018-03-09 NOTE — Discharge Instructions (Addendum)
Continue with Zyrtec, Flonase, inhaler. Take Tessalon for daytime cough as prescribed. Take Phenergan DM as needed as prescribed for nighttime cough.  Follow up with your doctor. Return to the ER for worsening or concerning symptoms.   Your chest x-ray is normal today- no signs of pneumonia. Likely a viral bronchitis which can last for 6 weeks in a healthy non smoker.

## 2018-03-09 NOTE — Telephone Encounter (Signed)
Okay 

## 2018-03-11 NOTE — Telephone Encounter (Signed)
Will schedule New Patient TOC appt with Dr Helane Rima

## 2018-03-11 NOTE — Telephone Encounter (Signed)
Will route to [facility] Pool to be scheduled.

## 2018-03-17 ENCOUNTER — Ambulatory Visit: Payer: Self-pay | Admitting: Neurology

## 2018-03-24 NOTE — Progress Notes (Signed)
Susan Hogan is a 57 y.o. female here for an acute visit.  History of Present Illness:   Susan Hogan, CMA acting as scribe for Dr. Briscoe Deutscher.   HPI: Patient in for evaluation for asthma exacerbation. Patient states she has had increased shortness of breath and wheezing. She has seen several providers in the last few years but has not found one she likes.   She is taking Albuterol and Breo. She has been having swelling in both legs. She is not able to lay flat to  Sleep. She has been having trouble with fever and chills. She has had chest pain and shortness of breath with exercise. She has had heart monitor that came back normal in the past.   PMHx, SurgHx, SocialHx, Medications, and Allergies were reviewed in the Visit Navigator and updated as appropriate.  Current Medications:   .  albuterol (PROVENTIL HFA;VENTOLIN HFA) 108 (90 Base) MCG/ACT inhaler, Inhale 1-2 puffs into the lungs every 6 (six) hours as needed for wheezing or shortness of breath., Disp: 1 Inhaler, Rfl: 0 .  fluticasone (FLONASE) 50 MCG/ACT nasal spray, Place 1 spray into both nostrils daily., Disp: , Rfl:  .  fluticasone furoate-vilanterol (BREO ELLIPTA) 200-25 MCG/INH AEPB, Inhale 1 puff into the lungs daily., Disp: , Rfl:   Allergies  Allergen Reactions  . Anesthetic Ether [Ether] Shortness Of Breath    IV anesthesia causes extreme pain and tightness in back and ribcage.  . Nubain [Nalbuphine Hcl] Anaphylaxis  . Penicillins Hives and Shortness Of Breath  . Advair Diskus [Fluticasone-Salmeterol] Hives  . Lactose Intolerance (Gi) Diarrhea   Review of Systems:   Pertinent items are noted in the HPI. Otherwise, ROS is negative.  Vitals:   Vitals:   03/25/18 1443  BP: 126/90  Pulse: 75  Temp: 98.6 F (37 C)  TempSrc: Oral  Weight: 231 lb 3.2 oz (104.9 kg)  Height: 5' (1.524 m)     Body mass index is 45.15 kg/m.  Physical Exam:   Physical Exam  Constitutional: She appears well-nourished.    HENT:  Head: Normocephalic and atraumatic.  Eyes: Pupils are equal, round, and reactive to light. EOM are normal.  Neck: Normal range of motion. Neck supple.  Cardiovascular: Normal rate, regular rhythm, normal heart sounds and intact distal pulses.  Pulmonary/Chest: Effort normal.  Abdominal: Soft.  Musculoskeletal: She exhibits edema.  Skin: Skin is warm.  Psychiatric: She has a normal mood and affect. Her behavior is normal.  Nursing note and vitals reviewed.  Assessment and Plan:   Susan Hogan was seen today for asthma.  Diagnoses and all orders for this visit:  Cough -     DG Chest 2 View; Future -     Cancel: CBC with Differential/Platelet  Hepatitis C virus infection without hepatic coma, unspecified chronicity -     Cancel: Comprehensive metabolic panel  Irritable bowel syndrome, unspecified type  Seasonal allergic rhinitis due to pollen  Adnexal cyst  Shortness of breath -     EKG 12-Lead  Type 2 diabetes mellitus, cannot tolerate Metformin -     Cancel: Hemoglobin A1c -     Cancel: Microalbumin / creatinine urine ratio -     Microalbumin / creatinine urine ratio; Future -     Hemoglobin A1c  Fatty liver -     Cancel: Comprehensive metabolic panel -     Lipid panel  Pure hypercholesterolemia -     Cancel: Lipid panel  Essential hypertension -  CBC with Differential/Platelet; Future -     Comprehensive metabolic panel; Future  Morbid obesity ([place]) -     Cancel: TSH  Sleep-disordered breathing -     Ambulatory referral to Sleep Studies  Vitamin D deficiency -     Cancel: VITAMIN D 25 Hydroxy (Vit-D Deficiency, Fractures) -     VITAMIN D 25 Hydroxy (Vit-D Deficiency, Fractures); Future  Bilateral lower extremity edema -     triamterene-hydrochlorothiazide (MAXZIDE-25) 37.5-25 MG tablet; Take 1 tablet by mouth daily.  Other orders -     blood glucose meter kit and supplies KIT; Dispense based on patient and insurance preference. Use up to four  times daily as directed. (FOR ICD-9 250.00, 250.01).    . Reviewed expectations re: course of current medical issues. . Discussed self-management of symptoms. . Outlined signs and symptoms indicating need for more acute intervention. . Patient verbalized understanding and all questions were answered. Marland Kitchen Health Maintenance issues including appropriate healthy diet, exercise, and smoking avoidance were discussed with patient. . See orders for this visit as documented in the electronic medical record. . Patient received an After Visit Summary.  CMA served as Education administrator during this visit. History, Physical, and Plan performed by medical provider. The above documentation has been reviewed and is accurate and complete. Briscoe Deutscher, D.O.  Records requested if needed. Time spent with the patient: 45 minutes, of which >50% was spent in obtaining information about her symptoms, reviewing her previous labs, evaluations, and treatments, counseling her about her condition (please see the discussed topics above), and developing a plan to further investigate it; she had a number of questions which I addressed.   Briscoe Deutscher, DO [place], [facility] 03/25/2018

## 2018-03-25 ENCOUNTER — Ambulatory Visit (INDEPENDENT_AMBULATORY_CARE_PROVIDER_SITE_OTHER): Payer: Medicaid Other

## 2018-03-25 ENCOUNTER — Ambulatory Visit (INDEPENDENT_AMBULATORY_CARE_PROVIDER_SITE_OTHER): Payer: Medicaid Other | Admitting: Family Medicine

## 2018-03-25 ENCOUNTER — Encounter: Payer: Self-pay | Admitting: Family Medicine

## 2018-03-25 VITALS — BP 126/90 | HR 75 | Temp 98.6°F | Ht 60.0 in | Wt 231.2 lb

## 2018-03-25 DIAGNOSIS — J301 Allergic rhinitis due to pollen: Secondary | ICD-10-CM

## 2018-03-25 DIAGNOSIS — R600 Localized edema: Secondary | ICD-10-CM | POA: Insufficient documentation

## 2018-03-25 DIAGNOSIS — R0602 Shortness of breath: Secondary | ICD-10-CM | POA: Diagnosis not present

## 2018-03-25 DIAGNOSIS — N949 Unspecified condition associated with female genital organs and menstrual cycle: Secondary | ICD-10-CM | POA: Insufficient documentation

## 2018-03-25 DIAGNOSIS — K589 Irritable bowel syndrome without diarrhea: Secondary | ICD-10-CM

## 2018-03-25 DIAGNOSIS — B1920 Unspecified viral hepatitis C without hepatic coma: Secondary | ICD-10-CM

## 2018-03-25 DIAGNOSIS — R05 Cough: Secondary | ICD-10-CM

## 2018-03-25 DIAGNOSIS — E7800 Pure hypercholesterolemia, unspecified: Secondary | ICD-10-CM | POA: Insufficient documentation

## 2018-03-25 DIAGNOSIS — E559 Vitamin D deficiency, unspecified: Secondary | ICD-10-CM

## 2018-03-25 DIAGNOSIS — R059 Cough, unspecified: Secondary | ICD-10-CM

## 2018-03-25 DIAGNOSIS — K760 Fatty (change of) liver, not elsewhere classified: Secondary | ICD-10-CM | POA: Diagnosis not present

## 2018-03-25 DIAGNOSIS — G4730 Sleep apnea, unspecified: Secondary | ICD-10-CM | POA: Diagnosis not present

## 2018-03-25 DIAGNOSIS — E119 Type 2 diabetes mellitus without complications: Secondary | ICD-10-CM

## 2018-03-25 DIAGNOSIS — I10 Essential (primary) hypertension: Secondary | ICD-10-CM | POA: Diagnosis not present

## 2018-03-25 MED ORDER — BLOOD GLUCOSE MONITOR KIT: PACK | 0 refills | Status: DC

## 2018-03-25 MED ORDER — TRIAMTERENE-HCTZ 37.5-25 MG PO TABS: 1.0000 | ORAL_TABLET | Freq: Every day | ORAL | 3 refills | Status: DC

## 2018-03-26 LAB — CBC WITH DIFFERENTIAL/PLATELET
Basophils Absolute: 52 cells/uL (ref 0–200)
Basophils Relative: 0.6 %
Eosinophils Absolute: 189 cells/uL (ref 15–500)
Eosinophils Relative: 2.2 %
HCT: 41.8 % (ref 35.0–45.0)
Hemoglobin: 15 g/dL (ref 11.7–15.5)
Lymphs Abs: 2864 cells/uL (ref 850–3900)
MCH: 33.9 pg — ABNORMAL HIGH (ref 27.0–33.0)
MCHC: 35.9 g/dL (ref 32.0–36.0)
MCV: 94.6 fL (ref 80.0–100.0)
MPV: 10.6 fL (ref 7.5–12.5)
Monocytes Relative: 8.8 %
Neutro Abs: 4739 cells/uL (ref 1500–7800)
Neutrophils Relative %: 55.1 %
Platelets: 213 10*3/uL (ref 140–400)
RBC: 4.42 10*6/uL (ref 3.80–5.10)
RDW: 12 % (ref 11.0–15.0)
Total Lymphocyte: 33.3 %
WBC mixed population: 757 cells/uL (ref 200–950)
WBC: 8.6 10*3/uL (ref 3.8–10.8)

## 2018-03-26 LAB — COMPREHENSIVE METABOLIC PANEL
AG Ratio: 1.7 (calc) (ref 1.0–2.5)
ALT: 179 U/L — ABNORMAL HIGH (ref 6–29)
AST: 153 U/L — ABNORMAL HIGH (ref 10–35)
Albumin: 3.8 g/dL (ref 3.6–5.1)
Alkaline phosphatase (APISO): 86 U/L (ref 33–130)
BUN: 16 mg/dL (ref 7–25)
CO2: 25 mmol/L (ref 20–32)
Calcium: 9 mg/dL (ref 8.6–10.4)
Chloride: 105 mmol/L (ref 98–110)
Creat: 0.66 mg/dL (ref 0.50–1.05)
Globulin: 2.2 g/dL (calc) (ref 1.9–3.7)
Glucose, Bld: 108 mg/dL — ABNORMAL HIGH (ref 65–99)
Potassium: 3.8 mmol/L (ref 3.5–5.3)
Sodium: 139 mmol/L (ref 135–146)
Total Bilirubin: 0.7 mg/dL (ref 0.2–1.2)
Total Protein: 6 g/dL — ABNORMAL LOW (ref 6.1–8.1)

## 2018-03-26 LAB — MICROALBUMIN / CREATININE URINE RATIO
Creatinine, Urine: 178 mg/dL (ref 20–275)
Microalb Creat Ratio: 7 mcg/mg creat (ref <30)
Microalb, Ur: 1.2 mg/dL

## 2018-03-26 LAB — LIPID PANEL
Cholesterol: 127 mg/dL (ref <200)
HDL: 50 mg/dL — ABNORMAL LOW (ref >50)
LDL Cholesterol (Calc): 60 mg/dL (calc)
Non-HDL Cholesterol (Calc): 77 mg/dL (calc) (ref <130)
Total CHOL/HDL Ratio: 2.5 (calc) (ref <5.0)
Triglycerides: 87 mg/dL (ref <150)

## 2018-03-26 LAB — HEMOGLOBIN A1C
Hgb A1c MFr Bld: 5.7 % of total Hgb — ABNORMAL HIGH (ref <5.7)
Mean Plasma Glucose: 117 (calc)
eAG (mmol/L): 6.5 (calc)

## 2018-03-26 LAB — VITAMIN D 25 HYDROXY (VIT D DEFICIENCY, FRACTURES): Vit D, 25-Hydroxy: 20 ng/mL — ABNORMAL LOW (ref 30–100)

## 2018-03-28 ENCOUNTER — Telehealth: Payer: Self-pay | Admitting: Family Medicine

## 2018-03-28 NOTE — Telephone Encounter (Signed)
Copied from CRM [id number]. Topic: Quick Communication - Lab Results >> Mar 28, 2018  4:06 PM Donnamarie Poag, CMA wrote: Called patient to inform them of 03/28/2018 lab results. When patient returns call, triage nurse may disclose results.

## 2018-03-29 NOTE — Telephone Encounter (Signed)
Charted in result notes. 

## 2018-04-07 ENCOUNTER — Other Ambulatory Visit: Payer: Self-pay

## 2018-04-07 DIAGNOSIS — Z1212 Encounter for screening for malignant neoplasm of rectum: Principal | ICD-10-CM

## 2018-04-07 DIAGNOSIS — Z1211 Encounter for screening for malignant neoplasm of colon: Secondary | ICD-10-CM

## 2018-04-08 ENCOUNTER — Ambulatory Visit (INDEPENDENT_AMBULATORY_CARE_PROVIDER_SITE_OTHER): Payer: Medicaid Other | Admitting: Family Medicine

## 2018-04-08 ENCOUNTER — Encounter: Payer: Self-pay | Admitting: Family Medicine

## 2018-04-08 VITALS — BP 154/88 | HR 78 | Temp 98.3°F | Ht 60.0 in | Wt 236.2 lb

## 2018-04-08 DIAGNOSIS — R5383 Other fatigue: Secondary | ICD-10-CM | POA: Diagnosis not present

## 2018-04-08 DIAGNOSIS — R609 Edema, unspecified: Secondary | ICD-10-CM | POA: Diagnosis not present

## 2018-04-08 DIAGNOSIS — R21 Rash and other nonspecific skin eruption: Secondary | ICD-10-CM

## 2018-04-08 DIAGNOSIS — R300 Dysuria: Secondary | ICD-10-CM | POA: Diagnosis not present

## 2018-04-08 NOTE — Progress Notes (Signed)
Susan Hogan is a 57 y.o. female is here for follow up.  History of Present Illness:   Susan Hogan CMA acting as scribe for Dr. Juleen Hogan.  HPI: Patient comes in today for follow up for her labs.   Rash: Patient was at the beach and she had flea bites. She has red splashes all over her legs with some pain. When she got in the shower patient stated that she "screamed" when the water hit her legs. She went to the ED for this while at the beach. She was put on prednisone and cetrizine while at the ED. We will get labs today.   SOB: Patient is having some SOB and chest heaviness. She was having to sleep on the couch when on vacation at the beach. Patient has an appointment on 05/25/18 with [name] for a sleep study.   Hep C: Patient has been diagnosed with Hep C several years ago. Per the patient she was told that she does not need treatment for this. Dr. Juleen Hogan will check into this.   Health Maintenance Due  Topic Date Due  . OPHTHALMOLOGY EXAM  11/06/1968  . TETANUS/TDAP  11/06/1977  . PAP SMEAR  11/07/1979  . MAMMOGRAM  11/06/2008  . COLONOSCOPY  10/21/2015   Depression screen PHQ 2/9 10/13/2016  Decreased Interest 0  Down, Depressed, Hopeless 0  PHQ - 2 Score 0   PMHx, SurgHx, SocialHx, FamHx, Medications, and Allergies were reviewed in the Visit Navigator and updated as appropriate.   Patient Active Problem List   Diagnosis Date Noted  . Seasonal allergic rhinitis due to pollen 03/25/2018  . Adnexal cyst 03/25/2018  . Morbid obesity ([place]) 03/25/2018  . Essential hypertension 03/25/2018  . Pure hypercholesterolemia 03/25/2018  . Sleep-disordered breathing 03/25/2018  . Vitamin D deficiency 03/25/2018  . Bilateral lower extremity edema 03/25/2018  . Acute meniscal tear of left knee 11/24/2016  . Chronic hepatitis C without hepatic coma ([place]) 11/24/2016  . Fatty liver 11/24/2016  . H/O degenerative disc disease 11/24/2016  . COPD (chronic obstructive pulmonary disease) ([place])  10/22/2016  . Type 2 diabetes mellitus, cannot tolerate Metformin 07/25/2014  . Fibromyalgia 07/25/2014  . MDD (major depressive disorder), recurrent severe, without psychosis ([name]) 03/17/2013  . Hepatitis C 08/02/2007  . IBS (irritable bowel syndrome) 08/02/2007  . Mild persistent asthma with acute exacerbation 09/21/1962   Social History   Tobacco Use  . Smoking status: Never Smoker  . Smokeless tobacco: Never Used  Substance Use Topics  . Alcohol use: No    Alcohol/week: 0.0 oz  . Drug use: No   Current Medications and Allergies:   Current Outpatient Medications:  .  albuterol (PROVENTIL HFA;VENTOLIN HFA) 108 (90 Base) MCG/ACT inhaler, Inhale 1-2 puffs into the lungs every 6 (six) hours as needed for wheezing or shortness of breath., Disp: 1 Inhaler, Rfl: 0 .  blood glucose meter kit and supplies KIT, Dispense based on patient and insurance preference. Use up to four times daily as directed. (FOR ICD-9 250.00, 250.01)., Disp: 1 each, Rfl: 0 .  fluticasone (FLONASE) 50 MCG/ACT nasal spray, Place 1 spray into both nostrils daily., Disp: , Rfl:  .  fluticasone furoate-vilanterol (BREO ELLIPTA) 200-25 MCG/INH AEPB, Inhale 1 puff into the lungs daily., Disp: , Rfl:  .  glucose blood (ACCU-CHEK ACTIVE STRIPS) test strip, Use as instructed, Disp: 100 each, Rfl: 12 .  triamterene-hydrochlorothiazide (MAXZIDE-25) 37.5-25 MG tablet, Take 1 tablet by mouth daily., Disp: 90 tablet, Rfl: 3 .  VITAMIN  B1-B12 IJ, Inject 1 each as directed every 30 (thirty) days., Disp: , Rfl:    Allergies  Allergen Reactions  . Anesthetic Ether [Ether] Shortness Of Breath    IV anesthesia causes extreme pain and tightness in back and ribcage.  . Nubain [Nalbuphine Hcl] Anaphylaxis  . Penicillins Hives and Shortness Of Breath  . Advair Diskus [Fluticasone-Salmeterol] Hives  . Lactose Intolerance (Gi) Diarrhea   Review of Systems   Pertinent items are noted in the HPI. Otherwise, ROS is  negative.  Vitals:   Vitals:   04/08/18 1515  BP: (!) 154/88  Pulse: 78  Temp: 98.3 F (36.8 C)  TempSrc: Oral  SpO2: 97%  Weight: 236 lb 3.2 oz (107.1 kg)  Height: 5' (1.524 m)     Body mass index is 46.13 kg/m.  Physical Exam:   Physical Exam  Constitutional: She is oriented to person, place, and time. She appears well-developed and well-nourished. No distress.  HENT:  Head: Normocephalic and atraumatic.  Right Ear: External ear normal.  Left Ear: External ear normal.  Nose: Nose normal.  Mouth/Throat: Oropharynx is clear and moist.  Eyes: Pupils are equal, round, and reactive to light. Conjunctivae and EOM are normal.  Neck: Normal range of motion. Neck supple. No thyromegaly present.  Cardiovascular: Normal rate, regular rhythm, normal heart sounds and intact distal pulses.  Pulmonary/Chest: Effort normal and breath sounds normal.  Abdominal: Soft. Bowel sounds are normal.  Musculoskeletal: Normal range of motion.  Lymphadenopathy:    She has no cervical adenopathy.  Neurological: She is alert and oriented to person, place, and time.  Skin: Skin is warm and dry. Capillary refill takes less than 2 seconds.  Diffuse, red, petechial rash. Nontender. Smooth.  Psychiatric: She has a normal mood and affect. Her behavior is normal.  Nursing note and vitals reviewed.  Results for orders placed or performed in visit on 04/08/18  C-reactive protein  Result Value Ref Range   CRP 0.4 <8.0 mg/L  Sedimentation rate  Result Value Ref Range   Sed Rate 2 0 - 30 mm/h  Protime-INR  Result Value Ref Range   INR 1.0    Prothrombin Time 10.6 9.0 - 11.5 sec  Urinalysis, Routine w reflex microscopic  Result Value Ref Range   Color, Urine YELLOW YELLOW   APPearance CLEAR CLEAR   Specific Gravity, Urine 1.017 1.001 - 1.03   pH 5.5 5.0 - 8.0   Glucose, UA NEGATIVE NEGATIVE   Bilirubin Urine NEGATIVE NEGATIVE   Ketones, ur NEGATIVE NEGATIVE   Hgb urine dipstick 1+ (A) NEGATIVE    Protein, ur NEGATIVE NEGATIVE   Nitrite NEGATIVE NEGATIVE   Leukocytes, UA TRACE (A) NEGATIVE   WBC, UA 0-5 0 - 5 /HPF   RBC / HPF 0-2 0 - 2 /HPF   Squamous Epithelial / LPF NONE SEEN < OR = 5 /HPF   Bacteria, UA NONE SEEN NONE SEEN /HPF   Hyaline Cast 0-1 (A) NONE SEEN /LPF  Comprehensive metabolic panel  Result Value Ref Range   Glucose, Bld 96 65 - 99 mg/dL   BUN 14 7 - 25 mg/dL   Creat 0.71 0.50 - 1.05 mg/dL   BUN/Creatinine Ratio NOT APPLICABLE 6 - 22 (calc)   Sodium 145 135 - 146 mmol/L   Potassium 3.7 3.5 - 5.3 mmol/L   Chloride 108 98 - 110 mmol/L   CO2 25 20 - 32 mmol/L   Calcium 9.2 8.6 - 10.4 mg/dL   Total Protein 6.1  6.1 - 8.1 g/dL   Albumin 3.7 3.6 - 5.1 g/dL   Globulin 2.4 1.9 - 3.7 g/dL (calc)   AG Ratio 1.5 1.0 - 2.5 (calc)   Total Bilirubin 0.5 0.2 - 1.2 mg/dL   Alkaline phosphatase (APISO) 74 33 - 130 U/L   AST 86 (H) 10 - 35 U/L   ALT 114 (H) 6 - 29 U/L  Vitamin B12  Result Value Ref Range   Vitamin B-12 645 200 - 1,100 pg/mL    Assessment and Plan:   Amyla was seen today for follow-up.  Diagnoses and all orders for this visit:  Edema, unspecified type -     C-reactive protein -     Sedimentation rate -     Protime-INR -     Comprehensive metabolic panel  Dysuria -     Urinalysis, Routine w reflex microscopic  Fatigue, unspecified type -     Vitamin B12  Rash -     C-reactive protein -     Sedimentation rate -     Protime-INR -     Urinalysis, Routine w reflex microscopic -     Dermatology pathology -     MICROSCOPIC MESSAGE    . Reviewed expectations re: course of current medical issues. . Discussed self-management of symptoms. . Outlined signs and symptoms indicating need for more acute intervention. . Patient verbalized understanding and all questions were answered. Marland Kitchen Health Maintenance issues including appropriate healthy diet, exercise, and smoking avoidance were discussed with patient. . See orders for this visit as  documented in the electronic medical record. . Patient received an After Visit Summary.  Briscoe Deutscher, DO [place], [facility] 04/09/2018  Future Appointments  Date Time Provider [place]  04/18/2018  1:00 PM Briscoe Deutscher, DO [facility]  05/25/2018  1:30 PM Star Age, MD [name] None   CMA served as scribe during this visit. History, Physical, and Plan performed by medical provider. The above documentation has been reviewed and is accurate and complete. Briscoe Deutscher, D.O.

## 2018-04-09 ENCOUNTER — Encounter: Payer: Self-pay | Admitting: Family Medicine

## 2018-04-09 LAB — COMPREHENSIVE METABOLIC PANEL
AG Ratio: 1.5 (calc) (ref 1.0–2.5)
ALT: 114 U/L — ABNORMAL HIGH (ref 6–29)
AST: 86 U/L — ABNORMAL HIGH (ref 10–35)
Albumin: 3.7 g/dL (ref 3.6–5.1)
Alkaline phosphatase (APISO): 74 U/L (ref 33–130)
BUN: 14 mg/dL (ref 7–25)
CO2: 25 mmol/L (ref 20–32)
Calcium: 9.2 mg/dL (ref 8.6–10.4)
Chloride: 108 mmol/L (ref 98–110)
Creat: 0.71 mg/dL (ref 0.50–1.05)
Globulin: 2.4 g/dL (calc) (ref 1.9–3.7)
Glucose, Bld: 96 mg/dL (ref 65–99)
Potassium: 3.7 mmol/L (ref 3.5–5.3)
Sodium: 145 mmol/L (ref 135–146)
Total Bilirubin: 0.5 mg/dL (ref 0.2–1.2)
Total Protein: 6.1 g/dL (ref 6.1–8.1)

## 2018-04-09 LAB — URINALYSIS, ROUTINE W REFLEX MICROSCOPIC
Bacteria, UA: NONE SEEN /HPF
Bilirubin Urine: NEGATIVE
Glucose, UA: NEGATIVE
Ketones, ur: NEGATIVE
Nitrite: NEGATIVE
Protein, ur: NEGATIVE
Specific Gravity, Urine: 1.017 (ref 1.001–1.03)
Squamous Epithelial / LPF: NONE SEEN /HPF (ref <=5)
pH: 5.5 (ref 5.0–8.0)

## 2018-04-09 LAB — C-REACTIVE PROTEIN: CRP: 0.4 mg/L (ref <8.0)

## 2018-04-09 LAB — PROTIME-INR
INR: 1
Prothrombin Time: 10.6 s (ref 9.0–11.5)

## 2018-04-09 LAB — SEDIMENTATION RATE: Sed Rate: 2 mm/h (ref 0–30)

## 2018-04-09 LAB — VITAMIN B12: Vitamin B-12: 645 pg/mL (ref 200–1100)

## 2018-04-10 ENCOUNTER — Emergency Department (HOSPITAL_COMMUNITY)
Admission: EM | Admit: 2018-04-10 | Discharge: 2018-04-11 | Disposition: A | Discharge status: Home / Self Care | Payer: Medicaid Other | Attending: Emergency Medicine | Admitting: Emergency Medicine

## 2018-04-10 ENCOUNTER — Other Ambulatory Visit: Payer: Self-pay

## 2018-04-10 ENCOUNTER — Encounter (HOSPITAL_COMMUNITY): Payer: Self-pay | Admitting: Emergency Medicine

## 2018-04-10 ENCOUNTER — Emergency Department (HOSPITAL_COMMUNITY): Payer: Medicaid Other

## 2018-04-10 DIAGNOSIS — R21 Rash and other nonspecific skin eruption: Secondary | ICD-10-CM

## 2018-04-10 DIAGNOSIS — J449 Chronic obstructive pulmonary disease, unspecified: Secondary | ICD-10-CM | POA: Diagnosis not present

## 2018-04-10 DIAGNOSIS — K5900 Constipation, unspecified: Secondary | ICD-10-CM | POA: Insufficient documentation

## 2018-04-10 DIAGNOSIS — Z79899 Other long term (current) drug therapy: Secondary | ICD-10-CM | POA: Diagnosis not present

## 2018-04-10 DIAGNOSIS — R600 Localized edema: Secondary | ICD-10-CM | POA: Insufficient documentation

## 2018-04-10 DIAGNOSIS — I10 Essential (primary) hypertension: Secondary | ICD-10-CM | POA: Insufficient documentation

## 2018-04-10 DIAGNOSIS — E119 Type 2 diabetes mellitus without complications: Secondary | ICD-10-CM | POA: Diagnosis not present

## 2018-04-10 DIAGNOSIS — R202 Paresthesia of skin: Secondary | ICD-10-CM | POA: Diagnosis not present

## 2018-04-10 DIAGNOSIS — R1084 Generalized abdominal pain: Secondary | ICD-10-CM | POA: Diagnosis not present

## 2018-04-10 LAB — COMPREHENSIVE METABOLIC PANEL
ALK PHOS: 71 U/L (ref 38–126)
ALT: 157 U/L — AB (ref 0–44)
AST: 127 U/L — ABNORMAL HIGH (ref 15–41)
Albumin: 3.6 g/dL (ref 3.5–5.0)
Anion gap: 11 (ref 5–15)
BILIRUBIN TOTAL: 1.1 mg/dL (ref 0.3–1.2)
BUN: 7 mg/dL (ref 6–20)
CALCIUM: 9.6 mg/dL (ref 8.9–10.3)
CO2: 26 mmol/L (ref 22–32)
Chloride: 102 mmol/L (ref 98–111)
Creatinine, Ser: 0.63 mg/dL (ref 0.44–1.00)
GFR calc non Af Amer: >60 mL/min (ref >60)
GLUCOSE: 105 mg/dL — AB (ref 70–99)
Potassium: 3.6 mmol/L (ref 3.5–5.1)
SODIUM: 139 mmol/L (ref 135–145)
Total Protein: 6.8 g/dL (ref 6.5–8.1)

## 2018-04-10 LAB — CBC WITH DIFFERENTIAL/PLATELET
Abs Immature Granulocytes: 0.1 10*3/uL (ref 0.0–0.1)
Basophils Absolute: 0.1 10*3/uL (ref 0.0–0.1)
Basophils Relative: 0 %
EOS ABS: 0.1 10*3/uL (ref 0.0–0.7)
EOS PCT: 1 %
HEMATOCRIT: 46.8 % — AB (ref 36.0–46.0)
Hemoglobin: 16 g/dL — ABNORMAL HIGH (ref 12.0–15.0)
Immature Granulocytes: 1 %
LYMPHS ABS: 2.5 10*3/uL (ref 0.7–4.0)
Lymphocytes Relative: 20 %
MCH: 34.4 pg — ABNORMAL HIGH (ref 26.0–34.0)
MCHC: 34.2 g/dL (ref 30.0–36.0)
MCV: 100.6 fL — ABNORMAL HIGH (ref 78.0–100.0)
Monocytes Absolute: 1.1 10*3/uL — ABNORMAL HIGH (ref 0.1–1.0)
Monocytes Relative: 9 %
Neutro Abs: 8.5 10*3/uL — ABNORMAL HIGH (ref 1.7–7.7)
Neutrophils Relative %: 69 %
Platelets: 197 10*3/uL (ref 150–400)
RBC: 4.65 MIL/uL (ref 3.87–5.11)
RDW: 12.4 % (ref 11.5–15.5)
WBC: 12.3 10*3/uL — AB (ref 4.0–10.5)

## 2018-04-10 LAB — CBG MONITORING, ED: GLUCOSE-CAPILLARY: 95 mg/dL (ref 70–99)

## 2018-04-10 LAB — CBC
HCT: 46 % (ref 36.0–46.0)
HEMOGLOBIN: 15.8 g/dL — AB (ref 12.0–15.0)
MCH: 34.3 pg — AB (ref 26.0–34.0)
MCHC: 34.3 g/dL (ref 30.0–36.0)
MCV: 99.8 fL (ref 78.0–100.0)
Platelets: 168 10*3/uL (ref 150–400)
RBC: 4.61 MIL/uL (ref 3.87–5.11)
RDW: 12.6 % (ref 11.5–15.5)
WBC: 12.2 10*3/uL — ABNORMAL HIGH (ref 4.0–10.5)

## 2018-04-10 LAB — I-STAT TROPONIN, ED: Troponin i, poc: 0 ng/mL (ref 0.00–0.08)

## 2018-04-10 LAB — LIPASE, BLOOD: Lipase: 32 U/L (ref 11–51)

## 2018-04-10 MED ORDER — IBUPROFEN 400 MG PO TABS
400.0000 mg | ORAL_TABLET | Freq: Once | ORAL | Status: AC | PRN
  Administered 2018-04-10: 400 mg via ORAL
  Filled 2018-04-10: qty 1

## 2018-04-10 MED ORDER — MORPHINE SULFATE (PF) 4 MG/ML IV SOLN
4.0000 mg | Freq: Once | INTRAVENOUS | Status: AC
  Administered 2018-04-10: 4 mg via INTRAVENOUS
  Filled 2018-04-10: qty 1

## 2018-04-10 MED ORDER — IOHEXOL 300 MG/ML  SOLN
100.0000 mL | Freq: Once | INTRAMUSCULAR | Status: AC | PRN
  Administered 2018-04-10: 100 mL via INTRAVENOUS

## 2018-04-10 MED ORDER — ONDANSETRON 4 MG PO TBDP
4.0000 mg | ORAL_TABLET | Freq: Once | ORAL | Status: AC | PRN
  Administered 2018-04-10: 4 mg via ORAL
  Filled 2018-04-10: qty 1

## 2018-04-10 MED ORDER — SODIUM CHLORIDE 0.9 % IV BOLUS
1000.0000 mL | Freq: Once | INTRAVENOUS | Status: AC
  Administered 2018-04-10: 1000 mL via INTRAVENOUS

## 2018-04-10 NOTE — ED Provider Notes (Addendum)
[facility] EMERGENCY DEPARTMENT Provider Note   CSN: [id number] Arrival date & time: 04/10/18  1754     History   Chief Complaint Chief Complaint  Patient presents with  . Rash  . Nausea  . Abdominal Pain    HPI Susan Hogan is a 57 y.o. female.  HPI 57 year old female past medical history significant for asthma, COPD, degenerative disc disease, diabetes, fibromyalgia, GERD presents to the emergency department today for evaluation of rash and abdominal pain.  Patient states that 2 weeks ago she was at the beach.  She fell asleep on the couch and woke up with fleabites on her lower legs bilaterally.  Patient states the next day she went to the ocean when she got out of the ocean the rash became more irritated.  She states that it became more erythematous and some swelling to her bilateral legs.  She went to the ER at the beach and they diagnosed with dermatitis.  They put her on antihistamine and prednisone.  Patient states that this did not help her symptoms.  She went to her primary care doctor 2 days ago and they did a biopsy at that time.  He also had lab work that was performed.  She had normal CRP and sed rate.  She had normal PT/INR.  The biopsy results are pending at this time.  They did not place her on antibiotics but told her that she may have flesh eating bacteria.  Patient states that her symptoms are persistent and worsening.  She reports swelling to her bilateral legs and ankles.  She reports that her legs are continued to be inflamed and that the rash is worsening.  She reports paresthesias to bilateral lower legs.  The pain is 10/10.  Patient states that this morning she woke up with abdominal pain and constipation.  She states that she has associated nausea but no emesis.  She states that the generalized belly pain is cramping and sharp in nature and radiates to her lower back.  Patient tried magnesium citrate over-the-counter and did have a bowel movement.   She reports some hemorrhoids but no acute melena or hematochezia.  Patient denies any associated fevers at this time but does report remote history of fever 1 week ago with 101.  Patient has not been taking anything for her symptoms today.  Patient states she is concerned about flesh eating bacteria.  She denies any chest pain or shortness of breath.  She denies any urinary symptoms.  Denies any known sick contacts.  Her biopsy results are pending at this time her primary care doctor.  Pt denies any  chill, ha, vision changes, lightheadedness, dizziness, congestion, neck pain, cp, sob, cough, , urinary symptoms, change in bowel habits, melena, hematochezia, lower extremity paresthesias.  Past Medical History:  Diagnosis Date  . Anemia   . Arthritis    RA "states does not see Rheumatologist"  . Asthma   . Carpal tunnel syndrome    bilaterally  . Complication of anesthesia    difficulty breathing  . Degenerative disk disease    back  . Depression   . Diabetes mellitus   . Fibromyalgia   . GERD (gastroesophageal reflux disease)   . Headache(784.0)    migraines  . Heart murmur   . Hepatitis    In followup treatment for hepatitis C  . Hypertension    no medication for at this time, sees Dr. Dinah Beers Pcp  . Hypotensive episode  hx of  . Irritable bowel syndrome   . Kidney stone    with hematuria  . Recurrent upper respiratory infection (URI)   . Shortness of breath   . Syncope     Patient Active Problem List   Diagnosis Date Noted  . Seasonal allergic rhinitis due to pollen 03/25/2018  . Adnexal cyst 03/25/2018  . Morbid obesity ([place]) 03/25/2018  . Essential hypertension 03/25/2018  . Pure hypercholesterolemia 03/25/2018  . Sleep-disordered breathing 03/25/2018  . Vitamin D deficiency 03/25/2018  . Bilateral lower extremity edema 03/25/2018  . Acute meniscal tear of left knee 11/24/2016  . Chronic hepatitis C without hepatic coma ([place]) 11/24/2016  . Fatty liver  11/24/2016  . H/O degenerative disc disease 11/24/2016  . COPD (chronic obstructive pulmonary disease) ([place]) 10/22/2016  . Type 2 diabetes mellitus, cannot tolerate Metformin 07/25/2014  . Fibromyalgia 07/25/2014  . MDD (major depressive disorder), recurrent severe, without psychosis ([place]) 03/17/2013  . Hepatitis C 08/02/2007  . IBS (irritable bowel syndrome) 08/02/2007  . Mild persistent asthma with acute exacerbation 09/21/1962    Past Surgical History:  Procedure Laterality Date  . CHOLECYSTECTOMY    . DILATION AND CURETTAGE OF UTERUS    . LIVER BIOPSY    . TONSILLECTOMY    . TUBAL LIGATION       OB History   None      Home Medications    Prior to Admission medications   Medication Sig Start Date End Date Taking? Authorizing Provider  albuterol (PROVENTIL HFA;VENTOLIN HFA) 108 (90 Base) MCG/ACT inhaler Inhale 1-2 puffs into the lungs every 6 (six) hours as needed for wheezing or shortness of breath. 03/03/18  Yes Couture, Cortni S, PA-C  blood glucose meter kit and supplies KIT Dispense based on patient and insurance preference. Use up to four times daily as directed. (FOR ICD-9 250.00, 250.01). 03/25/18  Yes Briscoe Deutscher, DO  fluticasone (FLONASE) 50 MCG/ACT nasal spray Place 1 spray into both nostrils daily as needed for allergies or rhinitis.    Yes [provider]  fluticasone furoate-vilanterol (BREO ELLIPTA) 200-25 MCG/INH AEPB Inhale 1 puff into the lungs daily as needed (SOB).  11/09/17  Yes [provider]  glucose blood (ACCU-CHEK ACTIVE STRIPS) test strip Use as instructed 04/15/16  Yes Nona Dell, PA-C  triamterene-hydrochlorothiazide (MAXZIDE-25) 37.5-25 MG tablet Take 1 tablet by mouth daily. 03/25/18  Yes Briscoe Deutscher, DO  VITAMIN B1-B12 IJ Inject 1 each as directed every 30 (thirty) days.   Yes [provider]  dicyclomine (BENTYL) 20 MG tablet Take 1 tablet (20 mg total) by mouth 2 (two) times daily. 04/11/18   Doristine Devoid, PA-C  doxycycline (VIBRAMYCIN) 100 MG capsule Take 1 capsule (100 mg total) by mouth 2 (two) times daily. 04/11/18   Doristine Devoid, PA-C  ondansetron (ZOFRAN ODT) 4 MG disintegrating tablet Take 1 tablet (4 mg total) by mouth every 8 (eight) hours as needed for nausea or vomiting. 04/11/18   Doristine Devoid, PA-C    Family History Family History  Problem Relation Age of Onset  . Heart disease Mother   . Diabetes Mother   . Asthma Mother   . Anemia Mother   . Colon cancer Maternal Uncle   . Heart disease Maternal Uncle   . Heart disease Maternal Grandmother   . Diabetes Maternal Grandmother   . Asthma Maternal Grandmother     Social History Social History   Tobacco Use  . Smoking status: Never  Smoker  . Smokeless tobacco: Never Used  Substance Use Topics  . Alcohol use: No    Alcohol/week: 0.0 oz  . Drug use: No     Allergies   Anesthetic ether [ether]; Nubain [nalbuphine hcl]; Penicillins; Advair diskus [fluticasone-salmeterol]; and Lactose intolerance (gi)   Review of Systems Review of Systems  All other systems reviewed and are negative.    Physical Exam Updated Vital Signs BP 136/78   Pulse (!) 58   Temp 98.4 F (36.9 C) (Oral)   Resp 18   Ht '4\' 11"'$  (1.499 m)   Wt 107 kg (236 lb)   SpO2 98%   BMI 47.67 kg/m   Physical Exam  Constitutional: She is oriented to person, place, and time. She appears well-developed and well-nourished.  Non-toxic appearance. No distress.  HENT:  Head: Normocephalic and atraumatic.  Nose: Nose normal.  Mouth/Throat: Oropharynx is clear and moist.  Eyes: Pupils are equal, round, and reactive to light. Conjunctivae are normal. Right eye exhibits no discharge. Left eye exhibits no discharge.  Neck: Normal range of motion. Neck supple.  Cardiovascular: Normal rate, regular rhythm, normal heart sounds and intact distal pulses. Exam reveals no gallop and no friction rub.  No murmur heard. Pulmonary/Chest:  Effort normal and breath sounds normal. No stridor. No respiratory distress. She has no wheezes. She has no rales. She exhibits no tenderness.  Abdominal: Soft. Normal appearance. Bowel sounds are increased. There is generalized tenderness. There is no rigidity, no rebound, no guarding, no CVA tenderness, no tenderness at McBurney's point and negative Murphy's sign.  Musculoskeletal: Normal range of motion. She exhibits no tenderness.  Lymphadenopathy:    She has no cervical adenopathy.  Neurological: She is alert and oriented to person, place, and time.  Skin: Skin is warm and dry. Capillary refill takes less than 2 seconds. Rash noted.  Diffuse, red, petechial rash, maculopapular rash to the bilateral lower extremities. Nontender. Smooth.  They are not warm to touch.  No drainage noted.  Mild edema of the bilateral lower extremity is noted.  This is nonpitting.  Rash is blanchable.  No crepitus noted.  Psychiatric: Her behavior is normal. Judgment and thought content normal.  Nursing note and vitals reviewed.      ED Treatments / Results  Labs (all labs ordered are listed, but only abnormal results are displayed) Labs Reviewed  COMPREHENSIVE METABOLIC PANEL - Abnormal; Notable for the following components:      Result Value   Glucose, Bld 105 (*)    AST 127 (*)    ALT 157 (*)    All other components within normal limits  CBC - Abnormal; Notable for the following components:   WBC 12.2 (*)    Hemoglobin 15.8 (*)    MCH 34.3 (*)    All other components within normal limits  CBC WITH DIFFERENTIAL/PLATELET - Abnormal; Notable for the following components:   WBC 12.3 (*)    Hemoglobin 16.0 (*)    HCT 46.8 (*)    MCV 100.6 (*)    MCH 34.4 (*)    Neutro Abs 8.5 (*)    Monocytes Absolute 1.1 (*)    All other components within normal limits  URINALYSIS, ROUTINE W REFLEX MICROSCOPIC - Abnormal; Notable for the following components:   Specific Gravity, Urine >1.046 (*)    Hgb urine  dipstick MODERATE (*)    All other components within normal limits  LIPASE, BLOOD  D-DIMER, QUANTITATIVE (NOT AT [facility])  CBG MONITORING, ED  I-STAT TROPONIN, ED  I-STAT TROPONIN, ED    EKG EKG Interpretation  Date/Time:  Monday April 11 2018 01:05:07 EDT Ventricular Rate:  60 PR Interval:    QRS Duration: 113 QT Interval:  415 QTC Calculation: 415 R Axis:   21 Text Interpretation:  Sinus rhythm Consider left atrial enlargement Borderline intraventricular conduction delay Low voltage, precordial leads Confirmed by Ripley Fraise [phone]) on 04/11/2018 1:23:11 AM   Radiology Dg Tibia/fibula Left  Result Date: 04/10/2018 CLINICAL DATA:  Pt states she was at the beach and had flea bites on her lower legs, after getting in the ocean, rash on both lower legs gets worse, increased swelling and redness bilaterally EXAM: LEFT TIBIA AND FIBULA - 2 VIEW COMPARISON:  04/15/2016 FINDINGS: There is no evidence of fracture or other focal bone lesions. Degenerative changes in the left knee. Soft tissues are unremarkable. No soft tissue gas collections and no foreign bodies identified. IMPRESSION: No acute bony abnormalities.  Soft tissues are unremarkable. Electronically Signed   By: Lucienne Capers M.D.   On: 04/10/2018 23:27   Dg Tibia/fibula Right  Result Date: 04/10/2018 CLINICAL DATA:  Pt states she was at the beach and had flea bites on her lower legs, after getting in the ocean, rash on both lower legs gets worse, increased swelling and redness bilaterally EXAM: RIGHT TIBIA AND FIBULA - 2 VIEW COMPARISON:  Right ankle 03/06/2012 FINDINGS: There is no evidence of fracture or other focal bone lesions. Degenerative changes in the right knee. Soft tissues are unremarkable. No soft tissue gas or foreign bodies identified. IMPRESSION: No acute bony abnormalities. Soft tissues are unremarkable. Electronically Signed   By: Lucienne Capers M.D.   On: 04/10/2018 23:30   Ct Abdomen Pelvis W  Contrast  Result Date: 04/10/2018 CLINICAL DATA:  Mid abdominal pain and constipation beginning today. EXAM: CT ABDOMEN AND PELVIS WITH CONTRAST TECHNIQUE: Multidetector CT imaging of the abdomen and pelvis was performed using the standard protocol following bolus administration of intravenous contrast. CONTRAST:  126m OMNIPAQUE IOHEXOL 300 MG/ML  SOLN COMPARISON:  Noncontrast CT on 11/23/2008 FINDINGS: Lower Chest: No acute findings. Hepatobiliary: No hepatic masses identified. Prior cholecystectomy. No evidence of biliary obstruction. Pancreas:  No mass or inflammatory changes. Spleen: Within normal limits in size and appearance. Adrenals/Urinary Tract: No masses identified. Tiny left renal cyst noted. No evidence of hydronephrosis. Unremarkable unopacified urinary bladder. Stomach/Bowel: No evidence of obstruction, inflammatory process or abnormal fluid collections. Normal appendix visualized. Vascular/Lymphatic: No pathologically enlarged lymph nodes. No abdominal aortic aneurysm. Reproductive: Normal appearance of uterus. A 3.0 cm probably benign-appearing left ovarian cyst is seen, which is new since previous study. No evidence of inflammatory process or abnormal fluid collections. Other:  None. Musculoskeletal:  No suspicious bone lesions identified. IMPRESSION: New 3 cm probably benign appearing left ovarian cyst. No evidence of free fluid. Follow-up by pelvic ultrasound is recommended in 6-12 weeks. Electronically Signed   By: JEarle GellM.D.   On: 04/10/2018 23:56    Procedures Procedures (including critical care time)  Medications Ordered in ED Medications  ondansetron (ZOFRAN-ODT) disintegrating tablet 4 mg (4 mg Oral Given 04/10/18 1852)  ibuprofen (ADVIL,MOTRIN) tablet 400 mg (400 mg Oral Given 04/10/18 1852)  morphine 4 MG/ML injection 4 mg (4 mg Intravenous Given 04/10/18 2253)  sodium chloride 0.9 % bolus 1,000 mL (0 mLs Intravenous Stopped 04/10/18 2354)  iohexol (OMNIPAQUE) 300 MG/ML  solution 100 mL (100 mLs Intravenous Contrast Given 04/10/18 2321)  doxycycline (VIBRA-TABS) tablet 100  mg (100 mg Oral Given 04/11/18 0154)  ondansetron (ZOFRAN) injection 4 mg (4 mg Intravenous Given 04/11/18 0156)  prochlorperazine (COMPAZINE) injection 10 mg (10 mg Intravenous Given 04/11/18 0234)  famotidine (PEPCID) IVPB 20 mg premix (0 mg Intravenous Stopped 04/11/18 0429)     Initial Impression / Assessment and Plan / ED Course  I have reviewed the triage vital signs and the nursing notes.  Pertinent labs & imaging results that were available during my care of the patient were reviewed by me and considered in my medical decision making (see chart for details).     Patient presents to the ED with multiple complaints.  Initially patient complains of ongoing rash after being exposed to ocean water 2 weeks ago.  Patient had biopsy performed by primary care doctor last week and is awaiting biopsy results.  Patient also complains of abdominal pain that started today.  Reports constipation.  Reports some nausea but denies any emesis.  Denies any associated fevers at this time.  She had lab work done on 7/19 by primary care.  INR/PT, sed rate, CRP were normal.  She had UA that showed no signs of infection at that time.  Count was 12,000.  Patient has been on prednisone.  Vital signs reassuring.  Patient is afebrile.  No hypotension or tachycardia noted.  On exam the patient is morbidly obese.  She has a generalized tender abdomen but no signs of peritonitis.  Bowel sounds present in all 4 quadrants.  No CVA tenderness.  Heart regular rate and rhythm.  Lungs clear to auscultation bilaterally.  No crepitus of the lower extremities over the rash.  Neurovascular intact in all extremities.   Work reassuring.  No significant change in patient's leukocytosis of 12,000.  She does appear mildly hemoconcentrated with a hemoglobin of 16.  No significant elect light derangement.  Normal kidney function.   Patient's AST and ALT are slightly elevated with history of same patient has history of hepatitis C and is aware of these.  Lipase normal.  Troponin was negative.  EKG shows normal sinus without any signs of acute ischemia.  Lower extremity x-rays were ordered that shows no signs of subcutaneous air that would be concerning for necrotizing fasciitis.  CT abdomen was performed that shows left ovarian cyst patient is aware of this and is followed by primary care for this.  No other acute findings noted.    Patient's pain improved in the ED.  Tolerate p.o. fluids without any emesis.  Will start patient on antibiotics including doxycycline.  Will be given dose in the ED today.  Will also given Bentyl and Zofran for abdominal cramping and nausea.  I suspect possible constipation due to patient's symptoms.  Encourage MiraLAX and stool softener at home.  Repeat abdominal exam shows no signs of peritonitis.  Patient does not meet Sirs or sepsis criteria.  Clinical presentation of patient's abdominal pain does not seem consistent with ACS, dissection or PE.  Doubt pyelonephritis or UTI.  Low suspicion for necrotizing fasciitis at this time.  Will trial round with outpatient antibiotics.  Patient has biopsy results pending with primary care.  We will follow-up this week with primary care.  Discussed reasons to return to the ED.  Pt is hemodynamically stable, in NAD, & able to ambulate in the ED. Evaluation does not show pathology that would require ongoing emergent intervention or inpatient treatment. I explained the diagnosis to the patient. Pain has been managed & has no complaints prior to  dc. Pt is comfortable with above plan and is stable for discharge at this time. All questions were answered prior to disposition. Strict return precautions for f/u to the ED were discussed. Encouraged follow up with PCP.  Patient also seen and evaluated by my attending who is agreed with the above plan.   Was notified by  nursing staff at approximately 12:45 AM patient was being discharged when she stood up and felt very lightheaded, dizziness has some palpitations with PVC on the monitor and felt very nauseous.  I suspect vasovagal.  Patient was given additional antinausea medication.  Further work-up was initiated including d-dimer and repeat troponin.  Repeat EKG was performed as well.  This showed no signs of acute ischemia.  Negative delta troponin.  D-dimer was negative.  Patient was watched several hours in the ED with additional nausea medication and Pepcid.  I discussed possible admission with Dr. Tamala Julian with hospital medicine who states that he felt patient would benefit from discharge and did not feel the patient needed admission at this time.  Patient has been able to ambulate without any further symptoms.  She is tolerating p.o. fluids without any emesis.  She feels much improved.  She is going to follow-up with her primary care doctor including the biopsy results this week.  We will continue take the doxycycline.    Final Clinical Impressions(s) / ED Diagnoses   Final diagnoses:  Rash  Generalized abdominal pain    ED Discharge Orders        Ordered    doxycycline (VIBRAMYCIN) 100 MG capsule  2 times daily,   Status:  Discontinued     04/11/18 0041    dicyclomine (BENTYL) 20 MG tablet  2 times daily,   Status:  Discontinued     04/11/18 0041    ondansetron (ZOFRAN ODT) 4 MG disintegrating tablet  Every 8 hours PRN,   Status:  Discontinued     04/11/18 0041    dicyclomine (BENTYL) 20 MG tablet  2 times daily     04/11/18 0442    doxycycline (VIBRAMYCIN) 100 MG capsule  2 times daily     04/11/18 0442    ondansetron (ZOFRAN ODT) 4 MG disintegrating tablet  Every 8 hours PRN     04/11/18 0442       Doristine Devoid, PA-C 04/11/18 0048    Tegeler, Gwenyth Allegra, MD 04/11/18 0245    Doristine Devoid, PA-C 04/11/18 0500    Doristine Devoid, PA-C 04/11/18 0501    Tegeler,  Gwenyth Allegra, MD 04/11/18 1315

## 2018-04-10 NOTE — ED Triage Notes (Addendum)
Pt reports two weeks ago she was at the beach, fell asleep on the couch and woke up with a bunch of flea bites on both legs from ankles to thighs. Next day pt reported she went out in the ocean and her legs became more irritated. Pt went to ER at the beach and they diagnosed her with dermatitis. Pt's went to her primary on Friday and they did a biopsy. Pt's ankles are swollen, legs are inflamed, rash spots are warm to touch with pins and needle sensation 10/10 pain. Pt woke up this morning with constipation, nausea, epigastric pain that radiates to her back and down her arm. Pt concerned about flesh eating bacteria. Hx of diabetes.

## 2018-04-11 LAB — URINALYSIS, ROUTINE W REFLEX MICROSCOPIC
BILIRUBIN URINE: NEGATIVE
Bacteria, UA: NONE SEEN
GLUCOSE, UA: NEGATIVE mg/dL
KETONES UR: NEGATIVE mg/dL
LEUKOCYTES UA: NEGATIVE
NITRITE: NEGATIVE
PH: 7 (ref 5.0–8.0)
Protein, ur: NEGATIVE mg/dL

## 2018-04-11 LAB — I-STAT TROPONIN, ED: Troponin i, poc: 0.01 ng/mL (ref 0.00–0.08)

## 2018-04-11 LAB — D-DIMER, QUANTITATIVE (NOT AT ARMC): D DIMER QUANT: 0.32 ug{FEU}/mL (ref 0.00–0.50)

## 2018-04-11 MED ORDER — DOXYCYCLINE HYCLATE 100 MG PO CAPS: 100.0000 mg | ORAL_CAPSULE | Freq: Two times a day (BID) | ORAL | 0 refills | Status: DC

## 2018-04-11 MED ORDER — ONDANSETRON HCL 4 MG/2ML IJ SOLN
4.0000 mg | Freq: Once | INTRAMUSCULAR | Status: AC
  Administered 2018-04-11: 4 mg via INTRAVENOUS
  Filled 2018-04-11: qty 2

## 2018-04-11 MED ORDER — FAMOTIDINE IN NACL 20-0.9 MG/50ML-% IV SOLN
20.0000 mg | INTRAVENOUS | Status: AC
  Administered 2018-04-11: 20 mg via INTRAVENOUS
  Filled 2018-04-11: qty 50

## 2018-04-11 MED ORDER — ONDANSETRON 4 MG PO TBDP: 4.0000 mg | ORAL_TABLET | Freq: Three times a day (TID) | ORAL | 0 refills | Status: DC | PRN

## 2018-04-11 MED ORDER — PROCHLORPERAZINE EDISYLATE 10 MG/2ML IJ SOLN
10.0000 mg | Freq: Once | INTRAMUSCULAR | Status: AC
  Administered 2018-04-11: 10 mg via INTRAVENOUS
  Filled 2018-04-11: qty 2

## 2018-04-11 MED ORDER — DICYCLOMINE HCL 20 MG PO TABS: 20.0000 mg | ORAL_TABLET | Freq: Two times a day (BID) | ORAL | 0 refills | Status: DC

## 2018-04-11 MED ORDER — DOXYCYCLINE HYCLATE 100 MG PO TABS
100.0000 mg | ORAL_TABLET | Freq: Once | ORAL | Status: AC
  Administered 2018-04-11: 100 mg via ORAL
  Filled 2018-04-11: qty 1

## 2018-04-11 NOTE — ED Notes (Signed)
Pt was given water for PO challenge.

## 2018-04-11 NOTE — ED Notes (Signed)
Pt ambulated without assistance and stated that she felt better

## 2018-04-11 NOTE — ED Notes (Signed)
Pt understood dc material. NAD noted. Script given at dc  

## 2018-04-11 NOTE — Discharge Instructions (Addendum)
Work-up has been reassuring in the emergency department today.  Take antibiotics as prescribed for infection.  I would also discussed her CAT scan results with you.  You have a left ovarian cyst that you are aware of.  Please follow-up as needed.  Follow-up with your primary care doctor for your biopsy.  Return the ED if symptoms not improve in the next 48 to 72 hours or if you develop worsening symptoms including fever, worsening pain.  One cap of Miralax mixed in a glass of water 1-2 times daily until bowel movements are regular. An over the counter stool softener such as Colace or Senokat S can also be used.  Follow up with your primary physician in regard's to today's visit. Return to ER for vomiting, new or worsening symptoms, any additional concerns.   GETTING TO GOOD BOWEL HEALTH.     The goal: ONE SOFT BOWEL MOVEMENT A DAY!  To have soft, regular bowel movements:  Drink at least 8 tall glasses of water a day.   Take plenty of fiber.  Fiber is the undigested part of plant food that passes into the colon, acting s ?natures broom? to encourage bowel motility and movement.  Fiber can absorb and hold large amounts of water. This results in a larger, bulkier stool, which is soft and easier to pass. Work gradually over several weeks up to 6 servings a day of fiber (25g a day even more if needed) in the form of: Vegetables -- Root (potatoes, carrots, turnips), leafy green (lettuce, salad greens, celery, spinach), or cooked high residue (cabbage, broccoli, etc) Fruit -- Fresh (unpeeled skin & pulp), Dried (prunes, apricots, cherries, etc ),  or stewed ( applesauce)  Whole grain breads, pasta, etc (whole wheat)  Bran cereals  No reading or other relaxing activity while on the toilet. If bowel movements take longer than 5 minutes, you are too constipated

## 2018-04-13 ENCOUNTER — Other Ambulatory Visit: Payer: Self-pay | Admitting: Surgical

## 2018-04-13 DIAGNOSIS — B1920 Unspecified viral hepatitis C without hepatic coma: Secondary | ICD-10-CM

## 2018-04-17 NOTE — Progress Notes (Unsigned)
Susan Hogan is a 57 y.o. female is here for follow up.  History of Present Illness:   HPI: Health Maintenance Due  Topic Date Due  . OPHTHALMOLOGY EXAM  11/06/1968  . TETANUS/TDAP  11/06/1977  . PAP SMEAR  11/07/1979  . MAMMOGRAM  11/06/2008  . COLONOSCOPY  10/21/2015   Depression screen PHQ 2/9 10/13/2016  Decreased Interest 0  Down, Depressed, Hopeless 0  PHQ - 2 Score 0   PMHx, SurgHx, SocialHx, FamHx, Medications, and Allergies were reviewed in the Visit Navigator and updated as appropriate.   Patient Active Problem List   Diagnosis Date Noted  . Seasonal allergic rhinitis due to pollen 03/25/2018  . Adnexal cyst 03/25/2018  . Morbid obesity ([place]) 03/25/2018  . Essential hypertension 03/25/2018  . Pure hypercholesterolemia 03/25/2018  . Sleep-disordered breathing 03/25/2018  . Vitamin D deficiency 03/25/2018  . Bilateral lower extremity edema 03/25/2018  . Acute meniscal tear of left knee 11/24/2016  . Chronic hepatitis C without hepatic coma ([place]) 11/24/2016  . Fatty liver 11/24/2016  . H/O degenerative disc disease 11/24/2016  . COPD (chronic obstructive pulmonary disease) ([place]) 10/22/2016  . Type 2 diabetes mellitus, cannot tolerate Metformin 07/25/2014  . Fibromyalgia 07/25/2014  . MDD (major depressive disorder), recurrent severe, without psychosis ([place]) 03/17/2013  . Hepatitis C 08/02/2007  . IBS (irritable bowel syndrome) 08/02/2007  . Mild persistent asthma with acute exacerbation 09/21/1962   Social History   Tobacco Use  . Smoking status: Never Smoker  . Smokeless tobacco: Never Used  Substance Use Topics  . Alcohol use: No    Alcohol/week: 0.0 oz  . Drug use: No   Current Medications and Allergies:   Current Outpatient Medications:  .  albuterol (PROVENTIL HFA;VENTOLIN HFA) 108 (90 Base) MCG/ACT inhaler, Inhale 1-2 puffs into the lungs every 6 (six) hours as needed for wheezing or shortness of breath., Disp: 1 Inhaler, Rfl: 0 .  blood  glucose meter kit and supplies KIT, Dispense based on patient and insurance preference. Use up to four times daily as directed. (FOR ICD-9 250.00, 250.01)., Disp: 1 each, Rfl: 0 .  dicyclomine (BENTYL) 20 MG tablet, Take 1 tablet (20 mg total) by mouth 2 (two) times daily., Disp: 20 tablet, Rfl: 0 .  doxycycline (VIBRAMYCIN) 100 MG capsule, Take 1 capsule (100 mg total) by mouth 2 (two) times daily., Disp: 12 capsule, Rfl: 0 .  fluticasone (FLONASE) 50 MCG/ACT nasal spray, Place 1 spray into both nostrils daily as needed for allergies or rhinitis. , Disp: , Rfl:  .  fluticasone furoate-vilanterol (BREO ELLIPTA) 200-25 MCG/INH AEPB, Inhale 1 puff into the lungs daily as needed (SOB). , Disp: , Rfl:  .  glucose blood (ACCU-CHEK ACTIVE STRIPS) test strip, Use as instructed, Disp: 100 each, Rfl: 12 .  ondansetron (ZOFRAN ODT) 4 MG disintegrating tablet, Take 1 tablet (4 mg total) by mouth every 8 (eight) hours as needed for nausea or vomiting., Disp: 6 tablet, Rfl: 0 .  triamterene-hydrochlorothiazide (MAXZIDE-25) 37.5-25 MG tablet, Take 1 tablet by mouth daily., Disp: 90 tablet, Rfl: 3 .  VITAMIN B1-B12 IJ, Inject 1 each as directed every 30 (thirty) days., Disp: , Rfl:   Allergies  Allergen Reactions  . Anesthetic Ether [Ether] Shortness Of Breath    IV anesthesia causes extreme pain and tightness in back and ribcage.  . Nubain [Nalbuphine Hcl] Anaphylaxis  . Penicillins Hives and Shortness Of Breath  . Advair Diskus [Fluticasone-Salmeterol] Hives  . Lactose Intolerance (Gi) Diarrhea  Review of Systems   Pertinent items are noted in the HPI. Otherwise, ROS is negative.  Vitals:  There were no vitals filed for this visit.   There is no height or weight on file to calculate BMI.  Physical Exam:   Physical Exam Results for orders placed or performed during the hospital encounter of 04/10/18  Lipase, blood  Result Value Ref Range   Lipase 32 11 - 51 U/L  Comprehensive metabolic panel    Result Value Ref Range   Sodium 139 135 - 145 mmol/L   Potassium 3.6 3.5 - 5.1 mmol/L   Chloride 102 98 - 111 mmol/L   CO2 26 22 - 32 mmol/L   Glucose, Bld 105 (H) 70 - 99 mg/dL   BUN 7 6 - 20 mg/dL   Creatinine, Ser 0.63 0.44 - 1.00 mg/dL   Calcium 9.6 8.9 - 10.3 mg/dL   Total Protein 6.8 6.5 - 8.1 g/dL   Albumin 3.6 3.5 - 5.0 g/dL   AST 127 (H) 15 - 41 U/L   ALT 157 (H) 0 - 44 U/L   Alkaline Phosphatase 71 38 - 126 U/L   Total Bilirubin 1.1 0.3 - 1.2 mg/dL   GFR calc non Af Amer >60 >60 mL/min   GFR calc Af Amer >60 >60 mL/min   Anion gap 11 5 - 15  CBC  Result Value Ref Range   WBC 12.2 (H) 4.0 - 10.5 K/uL   RBC 4.61 3.87 - 5.11 MIL/uL   Hemoglobin 15.8 (H) 12.0 - 15.0 g/dL   HCT 46.0 36.0 - 46.0 %   MCV 99.8 78.0 - 100.0 fL   MCH 34.3 (H) 26.0 - 34.0 pg   MCHC 34.3 30.0 - 36.0 g/dL   RDW 12.6 11.5 - 15.5 %   Platelets 168 150 - 400 K/uL  CBC with Differential  Result Value Ref Range   WBC 12.3 (H) 4.0 - 10.5 K/uL   RBC 4.65 3.87 - 5.11 MIL/uL   Hemoglobin 16.0 (H) 12.0 - 15.0 g/dL   HCT 46.8 (H) 36.0 - 46.0 %   MCV 100.6 (H) 78.0 - 100.0 fL   MCH 34.4 (H) 26.0 - 34.0 pg   MCHC 34.2 30.0 - 36.0 g/dL   RDW 12.4 11.5 - 15.5 %   Platelets 197 150 - 400 K/uL   Neutrophils Relative % 69 %   Neutro Abs 8.5 (H) 1.7 - 7.7 K/uL   Lymphocytes Relative 20 %   Lymphs Abs 2.5 0.7 - 4.0 K/uL   Monocytes Relative 9 %   Monocytes Absolute 1.1 (H) 0.1 - 1.0 K/uL   Eosinophils Relative 1 %   Eosinophils Absolute 0.1 0.0 - 0.7 K/uL   Basophils Relative 0 %   Basophils Absolute 0.1 0.0 - 0.1 K/uL   Immature Granulocytes 1 %   Abs Immature Granulocytes 0.1 0.0 - 0.1 K/uL  D-dimer, quantitative (not at [facility])  Result Value Ref Range   D-Dimer, Quant 0.32 0.00 - 0.50 ug/mL-FEU  Urinalysis, Routine w reflex microscopic  Result Value Ref Range   Color, Urine YELLOW YELLOW   APPearance CLEAR CLEAR   Specific Gravity, Urine >1.046 (H) 1.005 - 1.030   pH 7.0 5.0 - 8.0   Glucose,  UA NEGATIVE NEGATIVE mg/dL   Hgb urine dipstick MODERATE (A) NEGATIVE   Bilirubin Urine NEGATIVE NEGATIVE   Ketones, ur NEGATIVE NEGATIVE mg/dL   Protein, ur NEGATIVE NEGATIVE mg/dL   Nitrite NEGATIVE NEGATIVE   Leukocytes, UA NEGATIVE  NEGATIVE   RBC / HPF 0-5 0 - 5 RBC/hpf   WBC, UA 0-5 0 - 5 WBC/hpf   Bacteria, UA NONE SEEN NONE SEEN   Squamous Epithelial / LPF 0-5 0 - 5   Mucus PRESENT   CBG monitoring, ED  Result Value Ref Range   Glucose-Capillary 95 70 - 99 mg/dL  I-stat troponin, ED  Result Value Ref Range   Troponin i, poc 0.00 0.00 - 0.08 ng/mL   Comment 3          I-stat troponin, ED  Result Value Ref Range   Troponin i, poc 0.01 0.00 - 0.08 ng/mL   Comment 3            Assessment and Plan:   There are no diagnoses linked to this encounter.  . Reviewed expectations re: course of current medical issues. . Discussed self-management of symptoms. . Outlined signs and symptoms indicating need for more acute intervention. . Patient verbalized understanding and all questions were answered. Marland Kitchen Health Maintenance issues including appropriate healthy diet, exercise, and smoking avoidance were discussed with patient. . See orders for this visit as documented in the electronic medical record. . Patient received an After Visit Summary.  Briscoe Deutscher, DO [place] 04/17/2018  Future Appointments  Date Time Provider [place]  04/18/2018  1:00 PM Briscoe Deutscher, DO [facility]  05/25/2018  1:30 PM Star Age, MD [name] None    *** CMA served as scribe during this visit. History, Physical, and Plan performed by medical provider. The above documentation has been reviewed and is accurate and complete. Briscoe Deutscher, D.O.

## 2018-04-18 ENCOUNTER — Ambulatory Visit: Payer: Medicaid Other | Admitting: Family Medicine

## 2018-04-18 DIAGNOSIS — Z0289 Encounter for other administrative examinations: Secondary | ICD-10-CM

## 2018-04-21 ENCOUNTER — Encounter: Payer: Self-pay | Admitting: Family Medicine

## 2018-04-21 NOTE — Progress Notes (Deleted)
Susan Hogan is a 57 y.o. female is here for follow up.  History of Present Illness:   HPI: Health Maintenance Due  Topic Date Due  . OPHTHALMOLOGY EXAM  11/06/1968  . TETANUS/TDAP  11/06/1977  . PAP SMEAR  11/07/1979  . MAMMOGRAM  11/06/2008  . COLONOSCOPY  10/21/2015  . INFLUENZA VACCINE  04/21/2018   Depression screen PHQ 2/9 10/13/2016  Decreased Interest 0  Down, Depressed, Hopeless 0  PHQ - 2 Score 0   PMHx, SurgHx, SocialHx, FamHx, Medications, and Allergies were reviewed in the Visit Navigator and updated as appropriate.   Patient Active Problem List   Diagnosis Date Noted  . Seasonal allergic rhinitis due to pollen 03/25/2018  . Adnexal cyst 03/25/2018  . Morbid obesity ([place]) 03/25/2018  . Essential hypertension 03/25/2018  . Pure hypercholesterolemia 03/25/2018  . Sleep-disordered breathing 03/25/2018  . Vitamin D deficiency 03/25/2018  . Bilateral lower extremity edema 03/25/2018  . Acute meniscal tear of left knee 11/24/2016  . Chronic hepatitis C without hepatic coma ([place]) 11/24/2016  . Fatty liver 11/24/2016  . H/O degenerative disc disease 11/24/2016  . COPD (chronic obstructive pulmonary disease) ([place]) 10/22/2016  . Type 2 diabetes mellitus, cannot tolerate Metformin 07/25/2014  . Fibromyalgia 07/25/2014  . MDD (major depressive disorder), recurrent severe, without psychosis ([place]) 03/17/2013  . Hepatitis C 08/02/2007  . IBS (irritable bowel syndrome) 08/02/2007  . Mild persistent asthma with acute exacerbation 09/21/1962   Social History   Tobacco Use  . Smoking status: Never Smoker  . Smokeless tobacco: Never Used  Substance Use Topics  . Alcohol use: No    Alcohol/week: 0.0 oz  . Drug use: No   Current Medications and Allergies:   Current Outpatient Medications:  .  albuterol (PROVENTIL HFA;VENTOLIN HFA) 108 (90 Base) MCG/ACT inhaler, Inhale 1-2 puffs into the lungs every 6 (six) hours as needed for wheezing or shortness of breath., Disp:  1 Inhaler, Rfl: 0 .  blood glucose meter kit and supplies KIT, Dispense based on patient and insurance preference. Use up to four times daily as directed. (FOR ICD-9 250.00, 250.01)., Disp: 1 each, Rfl: 0 .  dicyclomine (BENTYL) 20 MG tablet, Take 1 tablet (20 mg total) by mouth 2 (two) times daily., Disp: 20 tablet, Rfl: 0 .  doxycycline (VIBRAMYCIN) 100 MG capsule, Take 1 capsule (100 mg total) by mouth 2 (two) times daily., Disp: 12 capsule, Rfl: 0 .  fluticasone (FLONASE) 50 MCG/ACT nasal spray, Place 1 spray into both nostrils daily as needed for allergies or rhinitis. , Disp: , Rfl:  .  fluticasone furoate-vilanterol (BREO ELLIPTA) 200-25 MCG/INH AEPB, Inhale 1 puff into the lungs daily as needed (SOB). , Disp: , Rfl:  .  glucose blood (ACCU-CHEK ACTIVE STRIPS) test strip, Use as instructed, Disp: 100 each, Rfl: 12 .  ondansetron (ZOFRAN ODT) 4 MG disintegrating tablet, Take 1 tablet (4 mg total) by mouth every 8 (eight) hours as needed for nausea or vomiting., Disp: 6 tablet, Rfl: 0 .  triamterene-hydrochlorothiazide (MAXZIDE-25) 37.5-25 MG tablet, Take 1 tablet by mouth daily., Disp: 90 tablet, Rfl: 3 .  VITAMIN B1-B12 IJ, Inject 1 each as directed every 30 (thirty) days., Disp: , Rfl:   Allergies  Allergen Reactions  . Anesthetic Ether [Ether] Shortness Of Breath    IV anesthesia causes extreme pain and tightness in back and ribcage.  . Nubain [Nalbuphine Hcl] Anaphylaxis  . Penicillins Hives and Shortness Of Breath  . Advair Diskus [Fluticasone-Salmeterol] Hives  .  Lactose Intolerance (Gi) Diarrhea   Review of Systems   Pertinent items are noted in the HPI. Otherwise, ROS is negative.  Vitals:  There were no vitals filed for this visit.   There is no height or weight on file to calculate BMI.  Physical Exam:   Physical Exam Results for orders placed or performed during the hospital encounter of 04/10/18  Lipase, blood  Result Value Ref Range   Lipase 32 11 - 51 U/L    Comprehensive metabolic panel  Result Value Ref Range   Sodium 139 135 - 145 mmol/L   Potassium 3.6 3.5 - 5.1 mmol/L   Chloride 102 98 - 111 mmol/L   CO2 26 22 - 32 mmol/L   Glucose, Bld 105 (H) 70 - 99 mg/dL   BUN 7 6 - 20 mg/dL   Creatinine, Ser 0.63 0.44 - 1.00 mg/dL   Calcium 9.6 8.9 - 10.3 mg/dL   Total Protein 6.8 6.5 - 8.1 g/dL   Albumin 3.6 3.5 - 5.0 g/dL   AST 127 (H) 15 - 41 U/L   ALT 157 (H) 0 - 44 U/L   Alkaline Phosphatase 71 38 - 126 U/L   Total Bilirubin 1.1 0.3 - 1.2 mg/dL   GFR calc non Af Amer >60 >60 mL/min   GFR calc Af Amer >60 >60 mL/min   Anion gap 11 5 - 15  CBC  Result Value Ref Range   WBC 12.2 (H) 4.0 - 10.5 K/uL   RBC 4.61 3.87 - 5.11 MIL/uL   Hemoglobin 15.8 (H) 12.0 - 15.0 g/dL   HCT 46.0 36.0 - 46.0 %   MCV 99.8 78.0 - 100.0 fL   MCH 34.3 (H) 26.0 - 34.0 pg   MCHC 34.3 30.0 - 36.0 g/dL   RDW 12.6 11.5 - 15.5 %   Platelets 168 150 - 400 K/uL  CBC with Differential  Result Value Ref Range   WBC 12.3 (H) 4.0 - 10.5 K/uL   RBC 4.65 3.87 - 5.11 MIL/uL   Hemoglobin 16.0 (H) 12.0 - 15.0 g/dL   HCT 46.8 (H) 36.0 - 46.0 %   MCV 100.6 (H) 78.0 - 100.0 fL   MCH 34.4 (H) 26.0 - 34.0 pg   MCHC 34.2 30.0 - 36.0 g/dL   RDW 12.4 11.5 - 15.5 %   Platelets 197 150 - 400 K/uL   Neutrophils Relative % 69 %   Neutro Abs 8.5 (H) 1.7 - 7.7 K/uL   Lymphocytes Relative 20 %   Lymphs Abs 2.5 0.7 - 4.0 K/uL   Monocytes Relative 9 %   Monocytes Absolute 1.1 (H) 0.1 - 1.0 K/uL   Eosinophils Relative 1 %   Eosinophils Absolute 0.1 0.0 - 0.7 K/uL   Basophils Relative 0 %   Basophils Absolute 0.1 0.0 - 0.1 K/uL   Immature Granulocytes 1 %   Abs Immature Granulocytes 0.1 0.0 - 0.1 K/uL  D-dimer, quantitative (not at [facility])  Result Value Ref Range   D-Dimer, Quant 0.32 0.00 - 0.50 ug/mL-FEU  Urinalysis, Routine w reflex microscopic  Result Value Ref Range   Color, Urine YELLOW YELLOW   APPearance CLEAR CLEAR   Specific Gravity, Urine >1.046 (H) 1.005 - 1.030    pH 7.0 5.0 - 8.0   Glucose, UA NEGATIVE NEGATIVE mg/dL   Hgb urine dipstick MODERATE (A) NEGATIVE   Bilirubin Urine NEGATIVE NEGATIVE   Ketones, ur NEGATIVE NEGATIVE mg/dL   Protein, ur NEGATIVE NEGATIVE mg/dL   Nitrite NEGATIVE  NEGATIVE   Leukocytes, UA NEGATIVE NEGATIVE   RBC / HPF 0-5 0 - 5 RBC/hpf   WBC, UA 0-5 0 - 5 WBC/hpf   Bacteria, UA NONE SEEN NONE SEEN   Squamous Epithelial / LPF 0-5 0 - 5   Mucus PRESENT   CBG monitoring, ED  Result Value Ref Range   Glucose-Capillary 95 70 - 99 mg/dL  I-stat troponin, ED  Result Value Ref Range   Troponin i, poc 0.00 0.00 - 0.08 ng/mL   Comment 3          I-stat troponin, ED  Result Value Ref Range   Troponin i, poc 0.01 0.00 - 0.08 ng/mL   Comment 3            Assessment and Plan:   There are no diagnoses linked to this encounter.  . Reviewed expectations re: course of current medical issues. . Discussed self-management of symptoms. . Outlined signs and symptoms indicating need for more acute intervention. . Patient verbalized understanding and all questions were answered. Marland Kitchen Health Maintenance issues including appropriate healthy diet, exercise, and smoking avoidance were discussed with patient. . See orders for this visit as documented in the electronic medical record. . Patient received an After Visit Summary.  Briscoe Deutscher, DO [place] 04/21/2018  Future Appointments  Date Time Provider [place]  04/22/2018  3:20 PM Briscoe Deutscher, DO [facility]  05/25/2018  1:30 PM Star Age, MD [name] None    *** CMA served as scribe during this visit. History, Physical, and Plan performed by medical provider. The above documentation has been reviewed and is accurate and complete. Briscoe Deutscher, D.O.

## 2018-04-22 ENCOUNTER — Ambulatory Visit: Payer: Medicaid Other | Admitting: Family Medicine

## 2018-05-12 ENCOUNTER — Ambulatory Visit (INDEPENDENT_AMBULATORY_CARE_PROVIDER_SITE_OTHER): Payer: Medicaid Other | Admitting: Family

## 2018-05-12 ENCOUNTER — Encounter: Payer: Self-pay | Admitting: Family

## 2018-05-12 VITALS — BP 152/91 | HR 62 | Temp 98.5°F | Wt 231.1 lb

## 2018-05-12 DIAGNOSIS — B182 Chronic viral hepatitis C: Secondary | ICD-10-CM

## 2018-05-12 NOTE — Assessment & Plan Note (Signed)
Susan Hogan has Chronic Hepatitis C with a 1a genotype and viral load of 200,000. It appears her most significant risk factor would be from previous tatttoos. She did receive 2 months of interferon injections but then stopped likely indicating incomplete treatment and would consider her treatment naive at this point. I will check her elastography. Discussed transmission, disease progression and treatment in detail. Likely plan for Mavyret for 8-12 weeks pending elastography results. Follow up pending imaging results.

## 2018-05-12 NOTE — Progress Notes (Signed)
Subjective:    Patient ID: Susan Hogan, female    DOB: 1961-01-15, 57 y.o.   MRN: [id number]  Chief Complaint  Patient presents with  . Hepatitis C    HPI:  Susan Hogan is a 57 y.o. female who presents today for initial office visit for evaluation of Hepatitis C.   Susan Hogan was first diagnosed with Hepatitis in 1998 when she came to [place] and was tested as part of a health screening. Risk factors for acquiring Hepatitis C include having a tattoo with re-used needles. Denies history of IV drug use, blood transfusion, sexual encounter with infected partner, or sharing of razors/toothbrushes. She has had previous treatment in the past with interferon for about 2 months. No current symptoms associated with Hepatitis C. She does not drink alcohol or use recreational or ilicit drugs. No family or personal history of liver disease.    Allergies  Allergen Reactions  . Anesthetic Ether [Ether] Shortness Of Breath    IV anesthesia causes extreme pain and tightness in back and ribcage.  . Nubain [Nalbuphine Hcl] Anaphylaxis  . Penicillins Hives and Shortness Of Breath  . Advair Diskus [Fluticasone-Salmeterol] Hives  . Lactose Intolerance (Gi) Diarrhea      Outpatient Medications Prior to Visit  Medication Sig Dispense Refill  . albuterol (PROVENTIL HFA;VENTOLIN HFA) 108 (90 Base) MCG/ACT inhaler Inhale 1-2 puffs into the lungs every 6 (six) hours as needed for wheezing or shortness of breath. 1 Inhaler 0  . blood glucose meter kit and supplies KIT Dispense based on patient and insurance preference. Use up to four times daily as directed. (FOR ICD-9 250.00, 250.01). 1 each 0  . fluticasone (FLONASE) 50 MCG/ACT nasal spray Place 1 spray into both nostrils daily as needed for allergies or rhinitis.     . fluticasone furoate-vilanterol (BREO ELLIPTA) 200-25 MCG/INH AEPB Inhale 1 puff into the lungs daily as needed (SOB).     Marland Kitchen glucose blood (ACCU-CHEK ACTIVE STRIPS) test strip  Use as instructed 100 each 12  . ondansetron (ZOFRAN ODT) 4 MG disintegrating tablet Take 1 tablet (4 mg total) by mouth every 8 (eight) hours as needed for nausea or vomiting. 6 tablet 0  . triamterene-hydrochlorothiazide (MAXZIDE-25) 37.5-25 MG tablet Take 1 tablet by mouth daily. 90 tablet 3  . VITAMIN B1-B12 IJ Inject 1 each as directed every 30 (thirty) days.    Marland Kitchen dicyclomine (BENTYL) 20 MG tablet Take 1 tablet (20 mg total) by mouth 2 (two) times daily. 20 tablet 0  . doxycycline (VIBRAMYCIN) 100 MG capsule Take 1 capsule (100 mg total) by mouth 2 (two) times daily. 12 capsule 0   No facility-administered medications prior to visit.      Past Medical History:  Diagnosis Date  . Anemia   . Arthritis    RA "states does not see Rheumatologist"  . Asthma   . Carpal tunnel syndrome    bilaterally  . Complication of anesthesia    difficulty breathing  . Degenerative disk disease    back  . Depression   . Diabetes mellitus   . Fibromyalgia   . GERD (gastroesophageal reflux disease)   . Headache(784.0)    migraines  . Heart murmur   . Hepatitis    In followup treatment for hepatitis C  . Hypertension    no medication for at this time, sees Dr. Dinah Beers Pcp  . Hypotensive episode    hx of  . Irritable bowel syndrome   . Kidney stone  with hematuria  . Recurrent upper respiratory infection (URI)   . Shortness of breath   . Syncope       Past Surgical History:  Procedure Laterality Date  . CHOLECYSTECTOMY    . DILATION AND CURETTAGE OF UTERUS    . Left knee surgery    . LIVER BIOPSY    . TONSILLECTOMY    . TUBAL LIGATION        Family History  Problem Relation Age of Onset  . Heart disease Mother   . Diabetes Mother   . Asthma Mother   . Anemia Mother   . Colon cancer Maternal Uncle   . Heart disease Maternal Uncle   . Heart disease Maternal Grandmother   . Diabetes Maternal Grandmother   . Asthma Maternal Grandmother       Social History     Socioeconomic History  . Marital status: Single    Spouse name: Not on file  . Number of children: 7  . Years of education: Not on file  . Highest education level: Not on file  Occupational History  . Occupation: Disabled  Social Needs  . Financial resource strain: Not on file  . Food insecurity:    Worry: Not on file    Inability: Not on file  . Transportation needs:    Medical: Not on file    Non-medical: Not on file  Tobacco Use  . Smoking status: Never Smoker  . Smokeless tobacco: Never Used  Substance and Sexual Activity  . Alcohol use: No    Alcohol/week: 0.0 standard drinks  . Drug use: No  . Sexual activity: Not on file  Lifestyle  . Physical activity:    Days per week: Not on file    Minutes per session: Not on file  . Stress: Not on file  Relationships  . Social connections:    Talks on phone: Not on file    Gets together: Not on file    Attends religious service: Not on file    Active member of club or organization: Not on file    Attends meetings of clubs or organizations: Not on file    Relationship status: Not on file  . Intimate partner violence:    Fear of current or ex partner: Not on file    Emotionally abused: Not on file    Physically abused: Not on file    Forced sexual activity: Not on file  Other Topics Concern  . Not on file  Social History Narrative  . Not on file      Review of Systems  Constitutional: Negative for chills, fatigue, fever and unexpected weight change.  Respiratory: Negative for cough, chest tightness, shortness of breath and wheezing.   Cardiovascular: Negative for chest pain and leg swelling.  Gastrointestinal: Negative for abdominal distention, constipation, diarrhea, nausea and vomiting.  Neurological: Negative for dizziness, weakness, light-headedness and headaches.  Hematological: Does not bruise/bleed easily.       Objective:    BP (!) 152/91   Pulse 62   Temp 98.5 F (36.9 C)   Wt 231 lb 1.9 oz  (104.8 kg)   BMI 46.68 kg/m  Nursing note and vital signs reviewed.  Physical Exam  Constitutional: She is oriented to person, place, and time. She appears well-developed. No distress.  Obese female seated in the chair; pleasant  Cardiovascular: Normal rate, regular rhythm and intact distal pulses. Exam reveals no gallop and no friction rub.  Murmur heard. Pulmonary/Chest: Effort normal  and breath sounds normal. No respiratory distress. She has no wheezes. She has no rales. She exhibits no tenderness.  Abdominal: Soft. Bowel sounds are normal. She exhibits no distension, no ascites and no mass. There is no hepatosplenomegaly. There is no tenderness. There is no rebound and no guarding.  Neurological: She is alert and oriented to person, place, and time.  Skin: Skin is warm and dry.  Psychiatric: She has a normal mood and affect.        Assessment & Plan:   Problem List Items Addressed This Visit      Digestive   Chronic hepatitis C without hepatic coma ([place]) - Primary    Ms. Castrillo has Chronic Hepatitis C with a 1a genotype and viral load of 200,000. It appears her most significant risk factor would be from previous tatttoos. She did receive 2 months of interferon injections but then stopped likely indicating incomplete treatment and would consider her treatment naive at this point. I will check her elastography. Discussed transmission, disease progression and treatment in detail. Likely plan for Mavyret for 8-12 weeks pending elastography results. Follow up pending imaging results.       Relevant Orders   US ABDOMEN COMPLETE W/ELASTOGRAPHY       I have discontinued Jorgina Turbin's dicyclomine and doxycycline. I am also having her maintain her glucose blood, albuterol, fluticasone furoate-vilanterol, fluticasone, VITAMIN B1-B12 IJ, blood glucose meter kit and supplies, triamterene-hydrochlorothiazide, and ondansetron.   No orders of the defined types were placed in this  encounter.    Follow-up: Return in about 1 month (around 06/12/2018), or if symptoms worsen or fail to improve.    Terri Piedra, MSN, FNP-C Nurse Practitioner [facility] Office phone: [phone] Pager: [place] number: [phone]

## 2018-05-12 NOTE — Patient Instructions (Signed)
Nice to meet you.  We will get you scheduled for an ultrasound of your liver.   Limit acetaminophen (Tylenol) usage to no more than 2 grams (2,000 mg) per day.  Avoid alcohol.  Do not share toothbrushes or razors.  Practice safe sex to protect against transmission as well as sexually transmitted disease.    Hepatitis C Hepatitis C is a viral infection of the liver. It can lead to scarring of the liver (cirrhosis), liver failure, or liver cancer. Hepatitis C may go undetected for months or years because people with the infection may not have symptoms, or they may have only mild symptoms. What are the causes? This condition is caused by the hepatitis C virus (HCV). The virus can spread from person to person (is contagious) through:  Blood.  Childbirth. A woman who has hepatitis C can pass it to her baby during birth.  Bodily fluids, such as breast milk, tears, semen, vaginal fluids, and saliva.  Blood transfusions or organ transplants done in the [place] before 1992.  What increases the risk? The following factors may make you more likely to develop this condition:  Having contact with unclean (contaminated) needles or syringes. This may result from: ? Acupuncture. ? Tattoing. ? Body piercing. ? Injecting drugs.  Having unprotected sex with someone who is infected.  Needing treatment to filter your blood (kidney dialysis).  Having HIV (human immunodeficiency virus) or AIDS (acquired immunodeficiency syndrome).  Working in a job that involves contact with blood or bodily fluids, such as health care.  What are the signs or symptoms? Symptoms of this condition include:  Fatigue.  Loss of appetite.  Nausea.  Vomiting.  Abdominal pain.  Dark yellow urine.  Yellowish skin and eyes (jaundice).  Itchy skin.  Clay-colored bowel movements.  Joint pain.  Bleeding and bruising easily.  Fluid building up in your stomach (ascites).  In some cases, you may  not have any symptoms. How is this diagnosed? This condition is diagnosed with:  Blood tests.  Other tests to check how well your liver is functioning. They may include: ? Magnetic resonance elastography (MRE). This imaging test uses MRIs and sound waves to measure liver stiffness. ? Transient elastography. This imaging test uses ultrasounds to measure liver stiffness. ? Liver biopsy. This test requires taking a small tissue sample from your liver to examine it under a microscope.  How is this treated? Your health care provider may perform noninvasive tests or a liver biopsy to help decide the best course of treatment. Treatment may include:  Antiviral medicines and other medicines.  Follow-up treatments every 6-12 months for infections or other liver conditions.  Receiving a donated liver (liver transplant).  Follow these instructions at home: Medicines  Take over-the-counter and prescription medicines only as told by your health care provider.  Take your antiviral medicine as told by your health care provider. Do not stop taking the antiviral even if you start to feel better.  Do not take any medicines unless approved by your health care provider, including over-the-counter medicines and birth control pills. Activity  Rest as needed.  Do not have sex unless approved by your health care provider.  Ask your health care provider when you may return to school or work. Eating and drinking  Eat a balanced diet with plenty of fruits and vegetables, whole grains, and lowfat (lean) meats or non-meat proteins (such as beans or tofu).  Drink enough fluids to keep your urine clear or pale yellow.  Do  not drink alcohol. General instructions  Do not share toothbrushes, nail clippers, or razors.  Wash your hands frequently with soap and water. If soap and water are not available, use hand sanitizer.  Cover any cuts or open sores on your skin to prevent spreading the virus.  Keep  all follow-up visits as told by your health care provider. This is important. You may need follow-up visits every 6-12 months. How is this prevented? There is no vaccine for hepatitis C. The only way to prevent the disease is to reduce the risk of exposure to the virus. Make sure you:  Wash your hands frequently with soap and water. If soap and water are not available, use hand sanitizer.  Do not share needles or syringes.  Practice safe sex and use condoms.  Avoid handling blood or bodily fluids without gloves or other protection.  Avoid getting tattoos or piercings in shops or other locations that are not clean.  Contact a health care provider if:  You have a fever.  You develop abdominal pain.  You pass dark urine.  You pass clay-colored stools.  You develop joint pain. Get help right away if:  You have increasing fatigue or weakness.  You lose your appetite.  You cannot eat or drink without vomiting.  You develop jaundice or your jaundice gets worse.  You bruise or bleed easily. Summary  Hepatitis C is a viral infection of the liver. It can lead to scarring of the liver (cirrhosis), liver failure, or liver cancer.  The hepatitis C virus (HCV) causes this condition. The virus can pass from person to person (is contagious).  You should not take any medicines unless approved by your health care provider. This includes over-the-counter medicines and birth control pills. This information is not intended to replace advice given to you by your health care provider. Make sure you discuss any questions you have with your health care provider. Document Released: 09/04/2000 Document Revised: 10/13/2016 Document Reviewed: 10/13/2016 Elsevier Interactive Patient Education  [employer].

## 2018-05-19 ENCOUNTER — Ambulatory Visit (HOSPITAL_COMMUNITY)
Admission: RE | Admit: 2018-05-19 | Discharge: 2018-05-19 | Disposition: A | Discharge status: Home / Self Care | Payer: Medicaid Other | Source: Ambulatory Visit | Attending: Family | Admitting: Family

## 2018-05-19 DIAGNOSIS — Z9049 Acquired absence of other specified parts of digestive tract: Secondary | ICD-10-CM | POA: Insufficient documentation

## 2018-05-19 DIAGNOSIS — B182 Chronic viral hepatitis C: Secondary | ICD-10-CM | POA: Insufficient documentation

## 2018-05-20 ENCOUNTER — Telehealth: Payer: Self-pay | Admitting: Family Medicine

## 2018-05-20 ENCOUNTER — Telehealth: Payer: Self-pay | Admitting: Family

## 2018-05-20 ENCOUNTER — Other Ambulatory Visit: Payer: Self-pay | Admitting: Family

## 2018-05-20 DIAGNOSIS — K7400 Hepatic fibrosis, unspecified: Secondary | ICD-10-CM

## 2018-05-20 DIAGNOSIS — B182 Chronic viral hepatitis C: Secondary | ICD-10-CM

## 2018-05-20 NOTE — Telephone Encounter (Signed)
See note

## 2018-05-20 NOTE — Telephone Encounter (Signed)
Notified patient that she would need to contact Dr. Ginny Forth office for the [place] results due to him ordering this test.

## 2018-05-20 NOTE — Telephone Encounter (Signed)
Attempted to contact patient regarding her ultrasound results and need for updating her viral load however received a generic voicemail. HIPPA compliant message left. Orders for viral load placed in the computer for her to complete. I will also send a referral to gastroenterology for increased liver fibrosis score.

## 2018-05-20 NOTE — Telephone Encounter (Signed)
Copied from CRM [id number]. Topic: Quick Communication - See Telephone Encounter >> May 20, 2018 12:28 PM Lorrine Kin, NT wrote: CRM for notification. See Telephone encounter for: 05/20/18. Patient calling to inquire about her ultrasound results. Please advise.

## 2018-05-25 ENCOUNTER — Institutional Professional Consult (permissible substitution): Payer: Self-pay | Admitting: Neurology

## 2018-05-25 ENCOUNTER — Telehealth: Payer: Self-pay

## 2018-05-25 NOTE — Telephone Encounter (Signed)
Pt did not show for their appt with Dr. Athar today.  

## 2018-05-26 ENCOUNTER — Encounter: Payer: Self-pay | Admitting: Neurology

## 2018-05-26 ENCOUNTER — Encounter: Payer: Self-pay | Admitting: Family Medicine

## 2018-05-26 ENCOUNTER — Ambulatory Visit (INDEPENDENT_AMBULATORY_CARE_PROVIDER_SITE_OTHER): Payer: Medicaid Other | Admitting: Family Medicine

## 2018-05-26 VITALS — BP 126/68 | HR 68 | Temp 97.8°F | Ht 59.0 in | Wt 230.6 lb

## 2018-05-26 DIAGNOSIS — M791 Myalgia, unspecified site: Secondary | ICD-10-CM | POA: Diagnosis not present

## 2018-05-26 MED ORDER — BACLOFEN 10 MG PO TABS: 10.0000 mg | ORAL_TABLET | Freq: Three times a day (TID) | ORAL | 0 refills | Status: DC

## 2018-05-26 NOTE — Progress Notes (Signed)
Patient: Susan Hogan MRN: [id number] DOB: [birth year] PCP: Helane Rima, DO     Subjective:  Chief Complaint  Patient presents with  . lump on right side of neck    tender to touch    HPI: The patient is a 57 y.o. female who presents today for lump on right side of neck, SOB, waking up w/sweats at night. She noticed this about one week ago. She was packing boxes at her moms house and that night the back of her right ear started to hurt and get swollen and then noticed the lump on the right side of her neck. It feels like it has moved down and forward. She still has pain behind her right ear. She feels like it has enlarged. She feels like it is starting to affect her swallowing. She does have night sweats and is unsure about fever. The pain sometimes will wake her up at night. She has taken advil for the pain. She has no other lymph nodes that she can tell. No URI issues. No other sick contacts. She also has some increased sob.   Review of Systems  Constitutional: Positive for fatigue.  HENT: Negative for ear pain.   Respiratory: Negative for shortness of breath.   Cardiovascular: Negative for chest pain.  Gastrointestinal: Negative for abdominal pain and nausea.  Musculoskeletal: Positive for neck pain.  Neurological: Negative for dizziness and headaches.    Allergies Patient is allergic to anesthetic ether [ether]; nubain [nalbuphine hcl]; penicillins; advair diskus [fluticasone-salmeterol]; and lactose intolerance (gi).  Past Medical History Patient  has a past medical history of Anemia, Arthritis, Asthma, Carpal tunnel syndrome, Complication of anesthesia, Degenerative disk disease, Depression, Diabetes mellitus, Fibromyalgia, GERD (gastroesophageal reflux disease), Headache(784.0), Heart murmur, Hepatitis, Hypertension, Hypotensive episode, Irritable bowel syndrome, Kidney stone, Recurrent upper respiratory infection (URI), Shortness of breath, and Syncope.  Surgical  History Patient  has a past surgical history that includes Tubal ligation; Dilation and curettage of uterus; Liver biopsy; Tonsillectomy; Cholecystectomy; and Left knee surgery.  Family History Pateint's family history includes Anemia in her mother; Asthma in her maternal grandmother and mother; Colon cancer in her maternal uncle; Diabetes in her maternal grandmother and mother; Heart disease in her maternal grandmother, maternal uncle, and mother.  Social History Patient  reports that she has never smoked. She has never used smokeless tobacco. She reports that she does not drink alcohol or use drugs.    Objective: Vitals:   05/26/18 1342  BP: 126/68  Pulse: 68  Temp: 97.8 F (36.6 C)  TempSrc: Oral  SpO2: 96%  Weight: 230 lb 9.6 oz (104.6 kg)  Height: 4\' 11"  (1.499 m)    Body mass index is 46.58 kg/m.  Physical Exam  Constitutional: She is oriented to person, place, and time.  obese  HENT:  Right Ear: External ear normal.  Left Ear: External ear normal.  Mouth/Throat: Oropharynx is clear and moist. No oropharyngeal exudate.  TM pearly with light reflex bilaterally   Neck:  No adenopathy appreciated in area of concern. TTP over post auricular area and over muscles of left side of neck (SCM). Muscle feels tight.   Cardiovascular: Regular rhythm and normal heart sounds.  Pulmonary/Chest: Effort normal and breath sounds normal.  Abdominal: Soft. Bowel sounds are normal.  Musculoskeletal: Normal range of motion.  Lymphadenopathy:    She has no cervical adenopathy.  Neurological: She is alert and oriented to person, place, and time. She displays normal reflexes. No cranial nerve deficit or sensory deficit.  Coordination normal.  Skin: Skin is warm and dry. No rash noted.       Assessment/plan: 1. Muscle ache Appears to have strained her muscle. No concerning symptoms with normal neuro and no radicular symptoms. Will do conservative therapy with heating pad, massage/stretches  and low dose muscle relaxer. Using bacolfen so I do not have to adjust for decreased hepatic functioning. If not better she will need to follow up for further work up with Dr. Earlene Plater as well as follow up for all of her chronic issues.       Return in about 1 week (around 06/02/2018) for chronic issue wiht dr. Earlene Plater .    Orland Mustard, MD [facility]   05/27/2018

## 2018-05-27 ENCOUNTER — Encounter: Payer: Self-pay | Admitting: Family Medicine

## 2018-06-01 ENCOUNTER — Other Ambulatory Visit: Payer: Self-pay | Admitting: Family Medicine

## 2018-06-01 ENCOUNTER — Ambulatory Visit (INDEPENDENT_AMBULATORY_CARE_PROVIDER_SITE_OTHER): Payer: Medicaid Other | Admitting: Family Medicine

## 2018-06-01 ENCOUNTER — Encounter: Payer: Self-pay | Admitting: Family Medicine

## 2018-06-01 VITALS — BP 128/82 | HR 61 | Temp 98.2°F | Ht 59.0 in | Wt 232.8 lb

## 2018-06-01 DIAGNOSIS — F321 Major depressive disorder, single episode, moderate: Secondary | ICD-10-CM

## 2018-06-01 DIAGNOSIS — F419 Anxiety disorder, unspecified: Secondary | ICD-10-CM

## 2018-06-01 DIAGNOSIS — G4730 Sleep apnea, unspecified: Secondary | ICD-10-CM

## 2018-06-01 DIAGNOSIS — Z598 Other problems related to housing and economic circumstances: Secondary | ICD-10-CM | POA: Diagnosis not present

## 2018-06-01 DIAGNOSIS — Z599 Problem related to housing and economic circumstances, unspecified: Secondary | ICD-10-CM

## 2018-06-01 MED ORDER — ESCITALOPRAM OXALATE 10 MG PO TABS: 10.0000 mg | ORAL_TABLET | Freq: Every day | ORAL | 1 refills | Status: DC

## 2018-06-01 NOTE — Patient Instructions (Signed)
Community Resources  Advocacy/Legal Legal Aid [place]:  1-866-219-5262  /  336-272-0148  Family Justice Center:  336-641-7233  [facility] [place] 24-hr Crisis line:  336-273-7273  Women's Resource Center, [place]:  336-275-6090  Court Watch (custody):  336-275-2346  [place] [facility]:   336-279-9299    Baby & Breastfeeding Car Seat Inspection @ Various [place] Fire Depts.- call 336-373-2177  [place] [facility]  336-832-6860  [facility] 336-878-6712  WIC: 336-641-3663 ([place]);  336-641-7571 ([place])  La Leche League:  1-877-452-5321   Childcare [place] [facility]: 336-369-5097 ([place]) / 336-887-8224 ([place])  - Child Care Resources/ Referrals/ Scholarships  - Head Start/ Early Head Start (call or apply online)  [place] DHHS: [place] Pre-K :  1-800-859-0829 / 336-274-5437   Employment / Job Search [facility] [place]: 336-275-6090 / [address]  [place] [facility] [employer] [facility] (JobLink): 336-373-5922 ([place]) / 336-882-4141 ([place])  [facility] Community Resource/ Career Center: 336-275-9801 / 336-282-7307  [facility] Job & Career Center: 336-373-3764  DHHS Work First: 336-641-3447 ([place]) / 336-641-3447 ([place])  [facility] [place]:  336-676-5871   Financial Assistance [place] [facility]:  336-553-2657  Sal[employer] Army: 336-235-0368  [place] Network (furniture):  336-370-4002  [place] Helping Hands: 336-373-4264  Low Income Energy Assistance  336-641-3000   Food Assistance DHHS- SNAP/ Food Stamps: 336-641-4588  WIC: [place]- 336-641-3663 ;  [place] 336-641-7571  Little Green Book- Free Meals  Little Blue Book- Free Food Pantries  During the summer, text "FOOD" to 877877   General Health / Clinics (Adults) Orange Card (for Adults) through [place] [facility]: (336) 895-4900  [facility]:   336-832-8035  [facility]:   336-832-4444  Health Department:  336-641-3245  [place]:  336-415-3877 / 336-641-2100  Planned Parenthood of [place]:   336-373-0678  [facility]:   336-334-4822 x 50251   Housing [place] Housing Coalition:   336-691-9521  [place] Housing Authority:  336-275-8501  Affordable Housing Managemnt:  336-273-0568   Immigrant/ Refugee Center for [place]ians (UNCG):  336-256-1065  Faith Action International House:  336-379-0037  [employer] Institute:  336-937-4701  Church World Services:  336-617-0381  African Services Coalition:  336-574-2677   LGBTQ YouthSAFE  www.youthsafe[place].org  PFLAG  336-541-6754 / info@[place].org  The Trevor Project:  1-866-488-7386   Mental Health/ Substance Use [facility] [place]  336-387-6161  [facility] [place] [facility]:  336-832-9700 or 1-800-711-2635  [facility] Circle of Care:  336-271-5888  [facility]:  336-294-1349  [facility] Care Services:  336-542-2884  [facility] (walk-ins)  336-676-6840 / [address]  [place]:  800-449-1287  Alcoholics Anonymous:  336-854-4278  Narcotics Anonymous:  800-365-1036  Quit Smoking Hotline:  800-QUIT-NOW (800-784-8669)   Parenting Children's Home Society:  800-632-1400  : Education Center & Support Groups:  336-832-6682  YWCA: 336-273-3461  UNCG: Bringing Out the Best:  336-334-3120               Thriving at Three (Hispanic families): 336-256-1066  Healthy Start ([facility] [place]):  336-387-6161 x2288  Parents as Teachers:  336-691-0024  [place] Child Development- Learning Together (Immigrants): 336-369-5001   Poison Control 800-222-1222  Sports & Recreation [employer] Open Doors Application: ymcanwnc.org/join/open-doors-financial-assistance/  City of [place] Recreation Centers: http://www.[place].gov/index.aspx?page=3615   Special Needs Family Support Network:  336-832-6507  Autism Society of :   336-333-0197 x1402 or x1412 /  800-785-1035  TEACCH [place]:  336-334-5773     ARC of [place]:  336-373-1076  Children's Developmental Service Agency (CDSA):  336-334-5601  CC4C (Care Coordination for Children):  336-641-7641   Transportation Medicaid Transportation: 336-641-4848 to apply  [place] Transit Authority: 336-335-6499 (reduced-fare bus ID to Medicaid/ Medicare/ Orange Card)  SCAT Paratransit services: Eligible riders only, call 336-333-6589 for application   Tutoring/Mentoring Black Child Development Institute: 336-230-2138  Big Brothers/ Big Sisters: 336-378-9100 ([place])  336-882-4167 ([place])  ACES through child's school: 336-370-2321  [employer] Achievers: contact your local Y  SHIELD Mentor Program: 336-337-2771   

## 2018-06-01 NOTE — Telephone Encounter (Signed)
OK to do 90 day supply.  

## 2018-06-01 NOTE — Progress Notes (Signed)
Susan Hogan is a 57 y.o. female is here for follow up.  History of Present Illness:   [name] acting as scribe for [name].  HPI: Patient come sin today for a follow up from seeing Dr. Rogers Blocker last week for neck pain. She states that this is not any better.   Shortness of breath: Patient states that she has been waking up out of her sleep with shortness of breath. She states that she has been waking up soak and wet. Patient states that she told Dr. Rogers Blocker this last week and was told that she would have to see [name] for this.   Health Maintenance Due  Topic Date Due  . OPHTHALMOLOGY EXAM  11/06/1968  . TETANUS/TDAP  11/06/1977  . PAP SMEAR  11/07/1979  . MAMMOGRAM  11/06/2008  . COLONOSCOPY  10/21/2015  . INFLUENZA VACCINE  04/21/2018   Depression screen [facility] 2/9 06/01/2018 05/12/2018 10/13/2016  Decreased Interest 3 1 0  Down, Depressed, Hopeless 3 1 0  PHQ - 2 Score 6 2 0  Altered sleeping 3 1 -  Tired, decreased energy 3 1 -  Change in appetite 3 1 -  Feeling bad or failure about yourself  - 1 -  Trouble concentrating 3 1 -  Moving slowly or fidgety/restless 3 1 -  Suicidal thoughts 0 1 -  PHQ-9 Score 21 9 -  Difficult doing work/chores Extremely dIfficult - -   PMHx, SurgHx, SocialHx, FamHx, Medications, and Allergies were reviewed in the Visit Navigator and updated as appropriate.   Patient Active Problem List   Diagnosis Date Noted  . Seasonal allergic rhinitis due to pollen 03/25/2018  . Adnexal cyst 03/25/2018  . Morbid obesity ([place]) 03/25/2018  . Essential hypertension 03/25/2018  . Pure hypercholesterolemia 03/25/2018  . Sleep-disordered breathing 03/25/2018  . Vitamin D deficiency 03/25/2018  . Bilateral lower extremity edema 03/25/2018  . Acute meniscal tear of left knee 11/24/2016  . Chronic hepatitis C without hepatic coma ([place]) 11/24/2016  . Fatty liver 11/24/2016  . H/O degenerative disc disease 11/24/2016  . COPD (chronic  obstructive pulmonary disease) ([place]) 10/22/2016  . Type 2 diabetes mellitus, cannot tolerate Metformin 07/25/2014  . Fibromyalgia 07/25/2014  . MDD (major depressive disorder), recurrent severe, without psychosis ([place]) 03/17/2013  . Hepatitis C 08/02/2007  . IBS (irritable bowel syndrome) 08/02/2007  . Mild persistent asthma with acute exacerbation 09/21/1962   Social History   Tobacco Use  . Smoking status: Never Smoker  . Smokeless tobacco: Never Used  Substance Use Topics  . Alcohol use: No    Alcohol/week: 0.0 standard drinks  . Drug use: No   Current Medications and Allergies:   .  albuterol (PROVENTIL HFA;VENTOLIN HFA) 108 (90 Base) MCG/ACT inhaler, Inhale 1-2 puffs into the lungs every 6 (six) hours as needed for wheezing or shortness of breath., Disp: 1 Inhaler, Rfl: 0 .  baclofen (LIORESAL) 10 MG tablet, Take 1 tablet (10 mg total) by mouth 3 (three) times daily., Disp: 30 each, Rfl: 0 .  blood glucose meter kit and supplies KIT, Dispense based on patient and insurance preference. Use up to four times daily as directed. (FOR ICD-9 250.00, 250.01)., Disp: 1 each, Rfl: 0 .  fluticasone (FLONASE) 50 MCG/ACT nasal spray, Place 1 spray into both nostrils daily as needed for allergies or rhinitis. , Disp: , Rfl:  .  fluticasone furoate-vilanterol (BREO ELLIPTA) 200-25 MCG/INH AEPB, Inhale 1 puff into the lungs daily as needed (SOB). ,  Disp: , Rfl:  .  glucose blood (ACCU-CHEK ACTIVE STRIPS) test strip, Use as instructed, Disp: 100 each, Rfl: 12 .  ondansetron (ZOFRAN ODT) 4 MG disintegrating tablet, Take 1 tablet (4 mg total) by mouth every 8 (eight) hours as needed for nausea or vomiting., Disp: 6 tablet, Rfl: 0 .  triamterene-hydrochlorothiazide (MAXZIDE-25) 37.5-25 MG tablet, Take 1 tablet by mouth daily., Disp: 90 tablet, Rfl: 3 .  VITAMIN B1-B12 IJ, Inject 1 each as directed every 30 (thirty) days., Disp: , Rfl:    Allergies  Allergen Reactions  . Anesthetic Ether [Ether]  Shortness Of Breath    IV anesthesia causes extreme pain and tightness in back and ribcage.  . Nubain [Nalbuphine Hcl] Anaphylaxis  . Penicillins Hives and Shortness Of Breath  . Advair Diskus [Fluticasone-Salmeterol] Hives  . Lactose Intolerance (Gi) Diarrhea   Review of Systems   Pertinent items are noted in the HPI. Otherwise, ROS is negative.  Vitals:   Vitals:   06/01/18 1421  BP: 128/82  Pulse: 61  Temp: 98.2 F (36.8 C)  TempSrc: Oral  SpO2: 97%  Weight: 232 lb 12.8 oz (105.6 kg)  Height: '4\' 11"'$  (1.499 m)     Body mass index is 47.02 kg/m.  Physical Exam:   Physical Exam  Constitutional: She appears well-nourished.  HENT:  Head: Normocephalic and atraumatic.  Eyes: Pupils are equal, round, and reactive to light. EOM are normal.  Neck: Normal range of motion. Neck supple.  Cardiovascular: Normal rate, regular rhythm, normal heart sounds and intact distal pulses.  Pulmonary/Chest: Effort normal.  Abdominal: Soft.  Musculoskeletal: She exhibits edema.  Skin: Skin is warm.  Psychiatric: She has a normal mood and affect. Her behavior is normal.  Nursing note and vitals reviewed.  Assessment and Plan:   Diagnoses and all orders for this visit:  Depression, major, single episode, moderate ([place]) Comments: Patient cried during much of our visit today. Lost disability and is worried about finances. No SI. Discussed treatment options. See below.  Orders: -     escitalopram (LEXAPRO) 10 MG tablet; Take 1 tablet (10 mg total) by mouth daily.  Anxiety -    escitalopram (LEXAPRO) 10 MG tablet; Take 1 tablet (10 mg total) by mouth daily.  Financial difficulties Comments: Provided information for financial and law assistance.   Sleep-disordered breathing Comments: Many symptoms could be contributed to OSA. Stressed the importance of testing and treatment.    . Reviewed expectations re: course of current medical issues. . Discussed self-management of  symptoms. . Outlined signs and symptoms indicating need for more acute intervention. . Patient verbalized understanding and all questions were answered. Marland Kitchen Health Maintenance issues including appropriate healthy diet, exercise, and smoking avoidance were discussed with patient. . See orders for this visit as documented in the electronic medical record. . Patient received an After Visit Summary.  CMA served as Education administrator during this visit. History, Physical, and Plan performed by medical provider. The above documentation has been reviewed and is accurate and complete. Briscoe Deutscher, D.O.  Briscoe Deutscher, DO [place] 06/12/2018

## 2018-06-16 ENCOUNTER — Ambulatory Visit: Payer: Self-pay | Admitting: *Deleted

## 2018-06-16 NOTE — Telephone Encounter (Signed)
FYI has app with you  today  

## 2018-06-16 NOTE — Telephone Encounter (Signed)
Sorry about that yeah she is.

## 2018-06-16 NOTE — Telephone Encounter (Signed)
See note

## 2018-06-16 NOTE — Telephone Encounter (Signed)
Pt complains of problems with her asthma for a month (shortness of breath) ; it has gotten worse; she also complains of "bone pain" starting 06/14/18; she has been having chills; the pt also thinks that she is having problems with her blood pressure because when she wakes up her head is soaked (she is unable to check her blood pressure at home); recommendations made per nurse triage protocol; the pt would like to see Dr Helane Rima but she has no availability within the pr's time constraints; pt offered and accepted appointment with Dr Orland Mustard, [facility], 06/17/18 at 1430; she verbalizes understanding; will route to office for notification of this upcoming appointment.     Reason for Disposition . [1] MODERATE longstanding difficulty breathing (e.g., speaks in phrases, SOB even at rest, pulse 100-120) AND [2] SAME as normal  Answer Assessment - Initial Assessment Questions 1. RESPIRATORY STATUS: "Describe your breathing?" (e.g., wheezing, shortness of breath, unable to speak, severe coughing)      Shortness of breath 2. ONSET: "When did this breathing problem begin?"      A month ago 3. PATTERN "Does the difficult breathing come and go, or has it been constant since it started?"      Comes and goes; mostly at night 4. SEVERITY: "How bad is your breathing?" (e.g., mild, moderate, severe)    - MILD: No SOB at rest, mild SOB with walking, speaks normally in sentences, can lay down, no retractions, pulse < 100.    - MODERATE: SOB at rest, SOB with minimal exertion and prefers to sit, cannot lie down flat, speaks in phrases, mild retractions, audible wheezing, pulse 100-120.    - SEVERE: Very SOB at rest, speaks in single words, struggling to breathe, sitting hunched forward, retractions, pulse > 120      moderate 5. RECURRENT SYMPTOM: "Have you had difficulty breathing before?" If so, ask: "When was the last time?" and "What happened that time?"      Yes history of asthma 6. CARDIAC  HISTORY: "Do you have any history of heart disease?" (e.g., heart attack, angina, bypass surgery, angioplasty)      Heart murmer 7. LUNG HISTORY: "Do you have any history of lung disease?"  (e.g., pulmonary embolus, asthma, emphysema)     asthma 8. CAUSE: "What do you think is causing the breathing problem?"      asthma 9. OTHER SYMPTOMS: "Do you have any other symptoms? (e.g., dizziness, runny nose, cough, chest pain, fever)     Runny nose, sneezing 10. PREGNANCY: "Is there any chance you are pregnant?" "When was your last menstrual period?"       no 11. TRAVEL: "Have you traveled out of the country in the last month?" (e.g., travel history, exposures)       no  Protocols used: BREATHING DIFFICULTY-A-AH

## 2018-06-16 NOTE — Telephone Encounter (Signed)
It's actually tomorrow on my schedule. Earlene Plater full?

## 2018-06-17 ENCOUNTER — Ambulatory Visit: Payer: Medicaid Other | Admitting: Family Medicine

## 2018-06-17 DIAGNOSIS — Z0289 Encounter for other administrative examinations: Secondary | ICD-10-CM

## 2018-07-04 DIAGNOSIS — J449 Chronic obstructive pulmonary disease, unspecified: Secondary | ICD-10-CM | POA: Insufficient documentation

## 2018-07-04 DIAGNOSIS — G43909 Migraine, unspecified, not intractable, without status migrainosus: Secondary | ICD-10-CM | POA: Diagnosis not present

## 2018-07-04 DIAGNOSIS — J4530 Mild persistent asthma, uncomplicated: Secondary | ICD-10-CM | POA: Insufficient documentation

## 2018-07-04 DIAGNOSIS — I10 Essential (primary) hypertension: Secondary | ICD-10-CM | POA: Insufficient documentation

## 2018-07-04 DIAGNOSIS — E119 Type 2 diabetes mellitus without complications: Secondary | ICD-10-CM | POA: Diagnosis not present

## 2018-07-04 DIAGNOSIS — Z79899 Other long term (current) drug therapy: Secondary | ICD-10-CM | POA: Diagnosis not present

## 2018-07-04 NOTE — ED Triage Notes (Signed)
Pt from home with c/o generalized aches and back pain. Pt also has had nausea x 1 week and migraine that began yesterday. Pt reports her chest feels cold but denies pain. Pt also has sinus congestion and reports "sometimes it is hard to breathe". Pt is able to speak in complete sentences without difficulty and is able to maintain oxygen at 99% RA

## 2018-07-05 ENCOUNTER — Other Ambulatory Visit: Payer: Self-pay

## 2018-07-05 ENCOUNTER — Emergency Department (HOSPITAL_COMMUNITY)
Admission: EM | Admit: 2018-07-05 | Discharge: 2018-07-05 | Disposition: A | Discharge status: Home / Self Care | Payer: Medicaid Other | Attending: Emergency Medicine | Admitting: Emergency Medicine

## 2018-07-05 ENCOUNTER — Encounter (HOSPITAL_COMMUNITY): Payer: Self-pay | Admitting: Emergency Medicine

## 2018-07-05 DIAGNOSIS — G43009 Migraine without aura, not intractable, without status migrainosus: Secondary | ICD-10-CM

## 2018-07-05 HISTORY — DX: Chronic obstructive pulmonary disease, unspecified: J44.9

## 2018-07-05 MED ORDER — SODIUM CHLORIDE 0.9 % IV BOLUS
1000.0000 mL | Freq: Once | INTRAVENOUS | Status: AC
  Administered 2018-07-05: 1000 mL via INTRAVENOUS

## 2018-07-05 MED ORDER — METOCLOPRAMIDE HCL 5 MG/ML IJ SOLN
10.0000 mg | Freq: Once | INTRAMUSCULAR | Status: AC
  Administered 2018-07-05: 10 mg via INTRAVENOUS
  Filled 2018-07-05: qty 2

## 2018-07-05 MED ORDER — DEXAMETHASONE SODIUM PHOSPHATE 10 MG/ML IJ SOLN
10.0000 mg | Freq: Once | INTRAMUSCULAR | Status: AC
  Administered 2018-07-05: 10 mg via INTRAVENOUS
  Filled 2018-07-05: qty 1

## 2018-07-05 MED ORDER — DIPHENHYDRAMINE HCL 50 MG/ML IJ SOLN
25.0000 mg | Freq: Once | INTRAMUSCULAR | Status: AC
  Administered 2018-07-05: 25 mg via INTRAVENOUS
  Filled 2018-07-05: qty 1

## 2018-07-05 NOTE — Discharge Instructions (Signed)
Glad you are feeling better. Follow-up with your primary care doctor. Return here for any new/acute changes.

## 2018-07-05 NOTE — ED Provider Notes (Signed)
[facility] DEPT Provider Note   CSN: [id number] Arrival date & time: 07/04/18  2334     History   Chief Complaint Chief Complaint  Patient presents with  . Migraine  . Back Pain    HPI Susan Hogan is a 57 y.o. female.  The history is provided by medical records and the patient.  Migraine  Associated symptoms include headaches.  Back Pain   Associated symptoms include headaches.     57 year old female with history of anemia, arthritis, asthma, COPD, depression, diabetes, fibromyalgia, hepatitis C, presenting to the ED with migraine headache.  States migraine started yesterday morning and has been ongoing since that time.  She reports headache is generalized, throbbing in nature.  She reports some nausea but denies vomiting.  She has associated photophobia and phonophobia.  She denies any falls or head trauma.  She has not had any dizziness, numbness, or focal weakness.  No difficulty walking.  No confusion, blurred vision, or slurred speech.  She is not currently on anticoagulation.  She tried taking some Excedrin Migraine at home without any improvement.  Past Medical History:  Diagnosis Date  . Anemia   . Arthritis    RA "states does not see Rheumatologist"  . Asthma   . Carpal tunnel syndrome    bilaterally  . Complication of anesthesia    difficulty breathing  . COPD (chronic obstructive pulmonary disease) ([place])   . Degenerative disk disease    back  . Depression   . Diabetes mellitus   . Fibromyalgia   . GERD (gastroesophageal reflux disease)   . Headache(784.0)    migraines  . Heart murmur   . Hepatitis    In followup treatment for hepatitis C  . Hypertension    no medication for at this time, sees Dr. Dinah Beers Pcp  . Hypotensive episode    hx of  . Irritable bowel syndrome   . Kidney stone    with hematuria  . Recurrent upper respiratory infection (URI)   . Shortness of breath   . Syncope     Patient  Active Problem List   Diagnosis Date Noted  . Seasonal allergic rhinitis due to pollen 03/25/2018  . Adnexal cyst 03/25/2018  . Morbid obesity ([place]) 03/25/2018  . Essential hypertension 03/25/2018  . Pure hypercholesterolemia 03/25/2018  . Sleep-disordered breathing 03/25/2018  . Vitamin D deficiency 03/25/2018  . Bilateral lower extremity edema 03/25/2018  . Acute meniscal tear of left knee 11/24/2016  . Chronic hepatitis C without hepatic coma ([place]) 11/24/2016  . Fatty liver 11/24/2016  . H/O degenerative disc disease 11/24/2016  . COPD (chronic obstructive pulmonary disease) ([place]) 10/22/2016  . Type 2 diabetes mellitus, cannot tolerate Metformin 07/25/2014  . Fibromyalgia 07/25/2014  . MDD (major depressive disorder), recurrent severe, without psychosis ([place]) 03/17/2013  . Hepatitis C 08/02/2007  . IBS (irritable bowel syndrome) 08/02/2007  . Mild persistent asthma with acute exacerbation 09/21/1962    Past Surgical History:  Procedure Laterality Date  . CHOLECYSTECTOMY    . DILATION AND CURETTAGE OF UTERUS    . Left knee surgery    . LIVER BIOPSY    . TONSILLECTOMY    . TUBAL LIGATION       OB History   None      Home Medications    Prior to Admission medications   Medication Sig Start Date End Date Taking? Authorizing Provider  albuterol (PROVENTIL HFA;VENTOLIN HFA) 108 (90 Base) MCG/ACT inhaler Inhale 1-2 puffs  into the lungs every 6 (six) hours as needed for wheezing or shortness of breath. 03/03/18   Couture, Cortni S, PA-C  baclofen (LIORESAL) 10 MG tablet Take 1 tablet (10 mg total) by mouth 3 (three) times daily. Patient not taking: Reported on 07/05/2018 05/26/18   Orma Flaming, MD  blood glucose meter kit and supplies KIT Dispense based on patient and insurance preference. Use up to four times daily as directed. (FOR ICD-9 250.00, 250.01). 03/25/18   Briscoe Deutscher, DO  escitalopram (LEXAPRO) 10 MG tablet TAKE 1 TABLET(10 MG) BY MOUTH DAILY Patient not  taking: Reported on 07/05/2018 06/01/18   Briscoe Deutscher, DO  glucose blood (ACCU-CHEK ACTIVE STRIPS) test strip Use as instructed 04/15/16   Nona Dell, PA-C  ondansetron (ZOFRAN ODT) 4 MG disintegrating tablet Take 1 tablet (4 mg total) by mouth every 8 (eight) hours as needed for nausea or vomiting. Patient not taking: Reported on 07/05/2018 04/11/18   Doristine Devoid, PA-C  triamterene-hydrochlorothiazide (MAXZIDE-25) 37.5-25 MG tablet Take 1 tablet by mouth daily. Patient not taking: Reported on 07/05/2018 03/25/18   Briscoe Deutscher, DO    Family History Family History  Problem Relation Age of Onset  . Heart disease Mother   . Diabetes Mother   . Asthma Mother   . Anemia Mother   . Colon cancer Maternal Uncle   . Heart disease Maternal Uncle   . Heart disease Maternal Grandmother   . Diabetes Maternal Grandmother   . Asthma Maternal Grandmother     Social History Social History   Tobacco Use  . Smoking status: Never Smoker  . Smokeless tobacco: Never Used  Substance Use Topics  . Alcohol use: No    Alcohol/week: 0.0 standard drinks  . Drug use: No     Allergies   Anesthetic ether [ether]; Nubain [nalbuphine hcl]; Penicillins; Advair diskus [fluticasone-salmeterol]; and Lactose intolerance (gi)   Review of Systems Review of Systems  Gastrointestinal: Positive for nausea.  Neurological: Positive for headaches.  All other systems reviewed and are negative.    Physical Exam Updated Vital Signs BP (!) 137/103 (BP Location: Right Arm)   Pulse 62   Temp 97.9 F (36.6 C) (Oral)   Resp 17   Ht 5' (1.524 m)   Wt 104.8 kg   LMP  (LMP Unknown)   SpO2 98%   BMI 45.11 kg/m   Physical Exam  Constitutional: She is oriented to person, place, and time. She appears well-developed and well-nourished. No distress.  Sleeping, had to be awoken for exam  HENT:  Head: Normocephalic and atraumatic.  Right Ear: External ear normal.  Left Ear: External ear  normal.  Eyes: Pupils are equal, round, and reactive to light. Conjunctivae and EOM are normal.  Neck: Normal range of motion and full passive range of motion without pain. Neck supple. No neck rigidity.  No rigidity, no meningismus  Cardiovascular: Normal rate, regular rhythm and normal heart sounds.  No murmur heard. Pulmonary/Chest: Effort normal and breath sounds normal. No respiratory distress. She has no wheezes. She has no rhonchi.  Abdominal: Soft. Bowel sounds are normal. There is no tenderness. There is no guarding.  Musculoskeletal: Normal range of motion. She exhibits no edema.  Neurological: She is alert and oriented to person, place, and time. She has normal strength. She displays no tremor. No cranial nerve deficit or sensory deficit. She displays no seizure activity.  AAOx3, answering questions and following commands appropriately; equal strength UE and LE bilaterally; CN grossly  intact; moves all extremities appropriately without ataxia; no focal neuro deficits or facial asymmetry appreciated  Skin: Skin is warm and dry. No rash noted. She is not diaphoretic.  Psychiatric: She has a normal mood and affect. Her behavior is normal. Thought content normal.  Nursing note and vitals reviewed.    ED Treatments / Results  Labs (all labs ordered are listed, but only abnormal results are displayed) Labs Reviewed - No data to display  EKG None  Radiology No results found.  Procedures Procedures (including critical care time)  Medications Ordered in ED Medications  sodium chloride 0.9 % bolus 1,000 mL (0 mLs Intravenous Stopped 07/05/18 0430)  diphenhydrAMINE (BENADRYL) injection 25 mg (25 mg Intravenous Given 07/05/18 0339)  metoCLOPramide (REGLAN) injection 10 mg (10 mg Intravenous Given 07/05/18 0340)  dexamethasone (DECADRON) injection 10 mg (10 mg Intravenous Given 07/05/18 0340)     Initial Impression / Assessment and Plan / ED Course  I have reviewed the triage  vital signs and the nursing notes.  Pertinent labs & imaging results that were available during my care of the patient were reviewed by me and considered in my medical decision making (see chart for details).  57 year old female here with migraine headache.  Began yesterday morning has been ongoing since that time.  Triage note reports back pain and sinus congestion, however patient made no mention of this to me during evaluation.  She is awake, alert, appropriately oriented.  Neurologic exam is nonfocal.  She has no signs or symptoms suggestive of meningitis.  Treated here with migraine cocktail with complete resolution of headache.  States she is feeling much better and is ready to go home.  Vitals are stable.  Appears stable for discharge.  Recommended continued symptomatic care at home as needed.  Close follow-up with PCP.  Return here for any new or worsening symptoms.  Final Clinical Impressions(s) / ED Diagnoses   Final diagnoses:  Migraine without aura and without status migrainosus, not intractable    ED Discharge Orders    None       Larene Pickett, PA-C 07/05/18 0515    Ripley Fraise, MD 07/05/18 [phone]

## 2018-08-23 ENCOUNTER — Encounter: Payer: Self-pay | Admitting: Family

## 2018-08-30 ENCOUNTER — Ambulatory Visit: Payer: Self-pay | Admitting: Family Medicine

## 2018-10-04 ENCOUNTER — Emergency Department (HOSPITAL_COMMUNITY)
Admission: EM | Admit: 2018-10-04 | Discharge: 2018-10-04 | Disposition: A | Discharge status: Home / Self Care | Payer: Medicaid Other | Attending: Emergency Medicine | Admitting: Emergency Medicine

## 2018-10-04 ENCOUNTER — Encounter (HOSPITAL_COMMUNITY): Payer: Self-pay | Admitting: Emergency Medicine

## 2018-10-04 ENCOUNTER — Other Ambulatory Visit: Payer: Self-pay

## 2018-10-04 DIAGNOSIS — R110 Nausea: Secondary | ICD-10-CM

## 2018-10-04 DIAGNOSIS — J45909 Unspecified asthma, uncomplicated: Secondary | ICD-10-CM | POA: Insufficient documentation

## 2018-10-04 DIAGNOSIS — Z79899 Other long term (current) drug therapy: Secondary | ICD-10-CM | POA: Diagnosis not present

## 2018-10-04 DIAGNOSIS — I10 Essential (primary) hypertension: Secondary | ICD-10-CM | POA: Insufficient documentation

## 2018-10-04 DIAGNOSIS — B349 Viral infection, unspecified: Secondary | ICD-10-CM | POA: Insufficient documentation

## 2018-10-04 DIAGNOSIS — R51 Headache: Secondary | ICD-10-CM | POA: Diagnosis present

## 2018-10-04 DIAGNOSIS — R519 Headache, unspecified: Secondary | ICD-10-CM

## 2018-10-04 DIAGNOSIS — E119 Type 2 diabetes mellitus without complications: Secondary | ICD-10-CM | POA: Diagnosis not present

## 2018-10-04 LAB — URINALYSIS, ROUTINE W REFLEX MICROSCOPIC
BACTERIA UA: NONE SEEN
BILIRUBIN URINE: NEGATIVE
Glucose, UA: NEGATIVE mg/dL
KETONES UR: NEGATIVE mg/dL
NITRITE: NEGATIVE
PROTEIN: NEGATIVE mg/dL
Specific Gravity, Urine: 1.003 — ABNORMAL LOW (ref 1.005–1.030)
pH: 6 (ref 5.0–8.0)

## 2018-10-04 LAB — COMPREHENSIVE METABOLIC PANEL
ALT: 141 U/L — ABNORMAL HIGH (ref 0–44)
ANION GAP: 10 (ref 5–15)
AST: 131 U/L — ABNORMAL HIGH (ref 15–41)
Albumin: 4.1 g/dL (ref 3.5–5.0)
Alkaline Phosphatase: 84 U/L (ref 38–126)
BUN: 16 mg/dL (ref 6–20)
CHLORIDE: 107 mmol/L (ref 98–111)
CO2: 24 mmol/L (ref 22–32)
Calcium: 9.4 mg/dL (ref 8.9–10.3)
Creatinine, Ser: 0.59 mg/dL (ref 0.44–1.00)
Glucose, Bld: 103 mg/dL — ABNORMAL HIGH (ref 70–99)
Potassium: 3.9 mmol/L (ref 3.5–5.1)
Sodium: 141 mmol/L (ref 135–145)
Total Bilirubin: 0.7 mg/dL (ref 0.3–1.2)
Total Protein: 7.2 g/dL (ref 6.5–8.1)

## 2018-10-04 LAB — CBC WITH DIFFERENTIAL/PLATELET
ABS IMMATURE GRANULOCYTES: 0.04 10*3/uL (ref 0.00–0.07)
BASOS PCT: 1 %
Basophils Absolute: 0.1 10*3/uL (ref 0.0–0.1)
Eosinophils Absolute: 0.2 10*3/uL (ref 0.0–0.5)
Eosinophils Relative: 2 %
HCT: 46.3 % — ABNORMAL HIGH (ref 36.0–46.0)
Hemoglobin: 15.7 g/dL — ABNORMAL HIGH (ref 12.0–15.0)
IMMATURE GRANULOCYTES: 0 %
Lymphocytes Relative: 36 %
Lymphs Abs: 3.4 10*3/uL (ref 0.7–4.0)
MCH: 33 pg (ref 26.0–34.0)
MCHC: 33.9 g/dL (ref 30.0–36.0)
MCV: 97.3 fL (ref 80.0–100.0)
MONO ABS: 0.7 10*3/uL (ref 0.1–1.0)
Monocytes Relative: 8 %
NEUTROS ABS: 5.1 10*3/uL (ref 1.7–7.7)
NEUTROS PCT: 53 %
Platelets: 194 10*3/uL (ref 150–400)
RBC: 4.76 MIL/uL (ref 3.87–5.11)
RDW: 12.1 % (ref 11.5–15.5)
WBC: 9.4 10*3/uL (ref 4.0–10.5)
nRBC: 0 % (ref 0.0–0.2)

## 2018-10-04 LAB — CBG MONITORING, ED: GLUCOSE-CAPILLARY: 130 mg/dL — AB (ref 70–99)

## 2018-10-04 LAB — LIPASE, BLOOD: LIPASE: 38 U/L (ref 11–51)

## 2018-10-04 MED ORDER — DIPHENHYDRAMINE HCL 50 MG/ML IJ SOLN
25.0000 mg | Freq: Once | INTRAMUSCULAR | Status: AC
  Administered 2018-10-04: 25 mg via INTRAVENOUS
  Filled 2018-10-04: qty 1

## 2018-10-04 MED ORDER — ONDANSETRON 4 MG PO TBDP: ORAL_TABLET | ORAL | 0 refills | Status: DC

## 2018-10-04 MED ORDER — KETOROLAC TROMETHAMINE 30 MG/ML IJ SOLN
15.0000 mg | Freq: Once | INTRAMUSCULAR | Status: AC
  Administered 2018-10-04: 15 mg via INTRAVENOUS
  Filled 2018-10-04: qty 1

## 2018-10-04 MED ORDER — PROCHLORPERAZINE EDISYLATE 10 MG/2ML IJ SOLN
10.0000 mg | Freq: Once | INTRAMUSCULAR | Status: AC
  Administered 2018-10-04: 10 mg via INTRAVENOUS
  Filled 2018-10-04: qty 2

## 2018-10-04 MED ORDER — SODIUM CHLORIDE 0.9 % IV BOLUS
1000.0000 mL | Freq: Once | INTRAVENOUS | Status: AC
  Administered 2018-10-04: 1000 mL via INTRAVENOUS

## 2018-10-04 NOTE — ED Provider Notes (Addendum)
[facility] DEPT Provider Note   CSN: [id number] Arrival date & time: 10/04/18  2831     History   Chief Complaint Chief Complaint  Patient presents with  . Hyperglycemia  . Headache    HPI Susan Hogan is a 58 y.o. female.  Susan Hogan is a 58 y.o. female with a history of asthma, hypertension, hepatitis C, COPD and arthritis, who presents to the emergency department for evaluation of headache, nausea and concern for hyperglycemia.  Patient reports that for the past week she has been feeling intermittently nauseated especially after eating but has not had any episodes of vomiting she reports some intermittent associated abdominal cramping.  No diarrhea, melena or hematochezia.  She also reports having intermittent headaches, no neck pain or stiffness.  No vision changes, numbness weakness or tingling.  Reports history of migraines and this feels similar.  Patient reports she is also had some intermittent nasal congestion and sore throat, no cough.  Multiple family members at home with similar symptoms.  She is also worried that her blood sugar may be high, she has history of diabetes reports this is diet controlled and she is not currently on any medications for this.  CBG in triage was 130.  She denies any fevers or chills, no chest pain or shortness of breath, no lightheadedness, dizziness or syncope.  She has not taken anything to treat her symptoms prior to arrival, denies any other aggravating or alleviating factors.  Patient's son is also with similar symptoms.     Past Medical History:  Diagnosis Date  . Anemia   . Arthritis    RA "states does not see Rheumatologist"  . Asthma   . Carpal tunnel syndrome    bilaterally  . Complication of anesthesia    difficulty breathing  . COPD (chronic obstructive pulmonary disease) ([place])   . Degenerative disk disease    back  . Depression   . Diabetes mellitus   . Fibromyalgia   . GERD  (gastroesophageal reflux disease)   . Headache(784.0)    migraines  . Heart murmur   . Hepatitis    In followup treatment for hepatitis C  . Hypertension    no medication for at this time, sees Dr. Dinah Beers Pcp  . Hypotensive episode    hx of  . Irritable bowel syndrome   . Kidney stone    with hematuria  . Recurrent upper respiratory infection (URI)   . Shortness of breath   . Syncope     Patient Active Problem List   Diagnosis Date Noted  . Seasonal allergic rhinitis due to pollen 03/25/2018  . Adnexal cyst 03/25/2018  . Morbid obesity ([place]) 03/25/2018  . Essential hypertension 03/25/2018  . Pure hypercholesterolemia 03/25/2018  . Sleep-disordered breathing 03/25/2018  . Vitamin D deficiency 03/25/2018  . Bilateral lower extremity edema 03/25/2018  . Acute meniscal tear of left knee 11/24/2016  . Chronic hepatitis C without hepatic coma ([place]) 11/24/2016  . Fatty liver 11/24/2016  . H/O degenerative disc disease 11/24/2016  . COPD (chronic obstructive pulmonary disease) ([place]) 10/22/2016  . Type 2 diabetes mellitus, cannot tolerate Metformin 07/25/2014  . Fibromyalgia 07/25/2014  . MDD (major depressive disorder), recurrent severe, without psychosis ([place]) 03/17/2013  . Hepatitis C 08/02/2007  . IBS (irritable bowel syndrome) 08/02/2007  . Mild persistent asthma with acute exacerbation 09/21/1962    Past Surgical History:  Procedure Laterality Date  . CHOLECYSTECTOMY    . DILATION AND CURETTAGE OF  UTERUS    . Left knee surgery    . LIVER BIOPSY    . TONSILLECTOMY    . TUBAL LIGATION       OB History   No obstetric history on file.      Home Medications    Prior to Admission medications   Medication Sig Start Date End Date Taking? Authorizing Provider  albuterol (PROVENTIL HFA;VENTOLIN HFA) 108 (90 Base) MCG/ACT inhaler Inhale 1-2 puffs into the lungs every 6 (six) hours as needed for wheezing or shortness of breath. 03/03/18  Yes Couture, Cortni S,  PA-C  aspirin-acetaminophen-caffeine (EXCEDRIN MIGRAINE) 250-250-65 MG tablet Take 1 tablet by mouth every 6 (six) hours as needed for headache.   Yes [provider]  ibuprofen (ADVIL,MOTRIN) 200 MG tablet Take 400 mg by mouth every 6 (six) hours as needed for headache or mild pain.   Yes [provider]  baclofen (LIORESAL) 10 MG tablet Take 1 tablet (10 mg total) by mouth 3 (three) times daily. Patient not taking: Reported on 07/05/2018 05/26/18   Orma Flaming, MD  blood glucose meter kit and supplies KIT Dispense based on patient and insurance preference. Use up to four times daily as directed. (FOR ICD-9 250.00, 250.01). 03/25/18   Briscoe Deutscher, DO  escitalopram (LEXAPRO) 10 MG tablet TAKE 1 TABLET(10 MG) BY MOUTH DAILY Patient not taking: Reported on 07/05/2018 06/01/18   Briscoe Deutscher, DO  glucose blood (ACCU-CHEK ACTIVE STRIPS) test strip Use as instructed 04/15/16   Nona Dell, PA-C  ondansetron (ZOFRAN ODT) 4 MG disintegrating tablet '4mg'$  ODT q4 hours prn nausea/vomit 10/04/18   Jacqlyn Larsen, PA-C  triamterene-hydrochlorothiazide (MAXZIDE-25) 37.5-25 MG tablet Take 1 tablet by mouth daily. Patient not taking: Reported on 07/05/2018 03/25/18   Briscoe Deutscher, DO    Family History Family History  Problem Relation Age of Onset  . Heart disease Mother   . Diabetes Mother   . Asthma Mother   . Anemia Mother   . Colon cancer Maternal Uncle   . Heart disease Maternal Uncle   . Heart disease Maternal Grandmother   . Diabetes Maternal Grandmother   . Asthma Maternal Grandmother     Social History Social History   Tobacco Use  . Smoking status: Never Smoker  . Smokeless tobacco: Never Used  Substance Use Topics  . Alcohol use: No    Alcohol/week: 0.0 standard drinks  . Drug use: No     Allergies   Anesthetic ether [ether]; Nubain [nalbuphine hcl]; Penicillins; Advair diskus [fluticasone-salmeterol]; and Lactose intolerance (gi)   Review of  Systems Review of Systems  Constitutional: Negative for chills and fever.  HENT: Positive for congestion, rhinorrhea and sore throat. Negative for ear discharge and ear pain.   Eyes: Negative for visual disturbance.  Respiratory: Negative for cough and shortness of breath.   Cardiovascular: Negative for chest pain.  Gastrointestinal: Positive for abdominal pain, nausea and vomiting. Negative for blood in stool, constipation and diarrhea.  Genitourinary: Negative for dysuria and frequency.  Musculoskeletal: Negative for arthralgias, myalgias, neck pain and neck stiffness.  Skin: Negative for color change and rash.  Neurological: Positive for headaches. Negative for dizziness, facial asymmetry, speech difficulty, weakness, light-headedness and numbness.     Physical Exam Updated Vital Signs BP (!) 163/84 (BP Location: Left Arm)   Pulse 60   Temp 98 F (36.7 C) (Oral)   Resp 20   Ht '4\' 11"'$  (1.499 m)   Wt 104.8 kg   LMP  (  LMP Unknown)   SpO2 98%   BMI 46.68 kg/m   Physical Exam Vitals signs and nursing note reviewed.  Constitutional:      General: She is not in acute distress.    Appearance: She is well-developed. She is obese. She is not ill-appearing, toxic-appearing or diaphoretic.  HENT:     Head: Normocephalic and atraumatic.     Mouth/Throat:     Mouth: Mucous membranes are moist.     Pharynx: Oropharynx is clear.  Eyes:     General:        Right eye: No discharge.        Left eye: No discharge.     Extraocular Movements: Extraocular movements intact.     Right eye: No nystagmus.     Left eye: No nystagmus.     Pupils: Pupils are equal, round, and reactive to light.  Neck:     Musculoskeletal: Normal range of motion and neck supple. No neck rigidity.     Meningeal: Brudzinski's sign and Kernig's sign absent.  Cardiovascular:     Rate and Rhythm: Normal rate and regular rhythm.     Heart sounds: Normal heart sounds.  Pulmonary:     Effort: Pulmonary effort is  normal. No respiratory distress.     Breath sounds: Normal breath sounds. No wheezing or rales.     Comments: Respirations equal and unlabored, patient able to speak in full sentences, lungs clear to auscultation bilaterally Abdominal:     General: Bowel sounds are normal. There is no distension.     Palpations: Abdomen is soft. There is no mass.     Tenderness: There is no abdominal tenderness. There is no guarding.     Comments: Abdomen soft, nondistended, nontender to palpation in all quadrants without guarding or peritoneal signs  Musculoskeletal:        General: No deformity.  Skin:    General: Skin is warm and dry.     Capillary Refill: Capillary refill takes less than 2 seconds.  Neurological:     Mental Status: She is alert and oriented to person, place, and time.     GCS: GCS eye subscore is 4. GCS verbal subscore is 5. GCS motor subscore is 6.     Coordination: Coordination normal.     Comments: Speech is clear, able to follow commands CN III-XII intact Normal strength in upper and lower extremities bilaterally including dorsiflexion and plantar flexion, strong and equal grip strength Sensation normal to light and sharp touch Moves extremities without ataxia, coordination intact Normal finger to nose and rapid alternating movements No pronator drift  Psychiatric:        Mood and Affect: Mood normal.        Behavior: Behavior normal.      ED Treatments / Results  Labs (all labs ordered are listed, but only abnormal results are displayed) Labs Reviewed  COMPREHENSIVE METABOLIC PANEL - Abnormal; Notable for the following components:      Result Value   Glucose, Bld 103 (*)    AST 131 (*)    ALT 141 (*)    All other components within normal limits  CBC WITH DIFFERENTIAL/PLATELET - Abnormal; Notable for the following components:   Hemoglobin 15.7 (*)    HCT 46.3 (*)    All other components within normal limits  URINALYSIS, ROUTINE W REFLEX MICROSCOPIC - Abnormal;  Notable for the following components:   Color, Urine STRAW (*)    Specific Gravity, Urine 1.003 (*)  Hgb urine dipstick MODERATE (*)    Leukocytes, UA TRACE (*)    All other components within normal limits  CBG MONITORING, ED - Abnormal; Notable for the following components:   Glucose-Capillary 130 (*)    All other components within normal limits  LIPASE, BLOOD    EKG None  Radiology No results found.  Procedures Procedures (including critical care time)  Medications Ordered in ED Medications  sodium chloride 0.9 % bolus 1,000 mL (0 mLs Intravenous Stopped 10/04/18 1143)  prochlorperazine (COMPAZINE) injection 10 mg (10 mg Intravenous Given 10/04/18 1040)  ketorolac (TORADOL) 30 MG/ML injection 15 mg (15 mg Intravenous Given 10/04/18 1040)  diphenhydrAMINE (BENADRYL) injection 25 mg (25 mg Intravenous Given 10/04/18 1039)     Initial Impression / Assessment and Plan / ED Course  I have reviewed the triage vital signs and the nursing notes.  Pertinent labs & imaging results that were available during my care of the patient were reviewed by me and considered in my medical decision making (see chart for details).  Patient presents with nausea without vomiting as well as some abdominal cramping for the past week and began having a headache a few days ago, history of migraines.  Also having some congestion and sore throat, multiple family members at home with similar symptoms.  On arrival patient only hypertensive but all other vitals normal and she is well-appearing.  Also expressed some concern for hyperglycemia but CBG is 130 on arrival and patient is nonfasting.  Patient has history of diet managed diabetes, recommended follow-up with PCP for continued management of this.  Given that patient has had persistent nausea and poor appetite with some intermittent abdominal cramping will check basic abdominal labs but abdominal exam today is reassuring, no concern for acute surgical abdomen.   Patient with typical migraine headache, this is unchanged from her usual.  No nuchal rigidity to suggest meningitis, patient afebrile.  No focal neurologic deficits.  No concern for [facility] or [facility].  Labs overall reassuring, no leukocytosis, hemoglobin slightly elevated, likely in the setting of hemoconcentration.  No acute electrolyte derangements, normal renal function, AST and ALT slightly elevated, but unchanged from patient's baseline, she does have a history of hepatitis C.  Urinalysis without signs of infection, normal lipase.   On reevaluation headache has completely resolved with treatment here in the emergency department and patient is tolerating p.o. fluids with no further nausea.  Given multiple family members with similar symptoms suspect viral etiology.  At this time feel patient is stable for discharge home, provided with Zofran prescription for nausea and encouraged fluids and advance diet slowly.  Return precautions discussed.  Patient encouraged to follow-up with her PCP.  Patient expresses understanding and agreement with plan.  Discharged home in good condition.  Final Clinical Impressions(s) / ED Diagnoses   Final diagnoses:  Acute nonintractable headache, unspecified headache type  Nausea  Viral illness    ED Discharge Orders         Ordered    ondansetron (ZOFRAN ODT) 4 MG disintegrating tablet     10/04/18 1159           Jacqlyn Larsen, [place] 10/09/18 1029    Jacqlyn Larsen, [place] 10/10/18 1642    Lacretia Leigh, MD 10/11/18 [id number]

## 2018-10-04 NOTE — ED Triage Notes (Signed)
No answer from patient when called for room x 1.  

## 2018-10-04 NOTE — Discharge Instructions (Signed)
Your symptoms are likely due to a viral illness, your lab evaluation is reassuring and your blood glucose has been stable here.  You may use Zofran as needed for nausea.  Follow-up with your regular doctor for continued management of your chronic medical conditions.  Return to the emergency department if you have worsening headache, persistent vomiting, worsened abdominal pain, fevers or any other new or concerning symptoms.

## 2018-10-04 NOTE — ED Triage Notes (Signed)
Pt arriving with complaint of headache and hyperglycemia. CBG 130 upon assessment.

## 2018-10-13 DIAGNOSIS — J0100 Acute maxillary sinusitis, unspecified: Secondary | ICD-10-CM | POA: Insufficient documentation

## 2018-10-13 DIAGNOSIS — Z9049 Acquired absence of other specified parts of digestive tract: Secondary | ICD-10-CM | POA: Diagnosis not present

## 2018-10-13 DIAGNOSIS — E119 Type 2 diabetes mellitus without complications: Secondary | ICD-10-CM | POA: Insufficient documentation

## 2018-10-13 DIAGNOSIS — J449 Chronic obstructive pulmonary disease, unspecified: Secondary | ICD-10-CM | POA: Diagnosis not present

## 2018-10-13 DIAGNOSIS — F329 Major depressive disorder, single episode, unspecified: Secondary | ICD-10-CM | POA: Diagnosis not present

## 2018-10-13 DIAGNOSIS — I10 Essential (primary) hypertension: Secondary | ICD-10-CM | POA: Insufficient documentation

## 2018-10-13 DIAGNOSIS — J45909 Unspecified asthma, uncomplicated: Secondary | ICD-10-CM | POA: Insufficient documentation

## 2018-10-13 DIAGNOSIS — F419 Anxiety disorder, unspecified: Secondary | ICD-10-CM | POA: Diagnosis present

## 2018-10-14 ENCOUNTER — Emergency Department (HOSPITAL_COMMUNITY): Payer: Medicaid Other

## 2018-10-14 ENCOUNTER — Emergency Department (HOSPITAL_COMMUNITY)
Admission: EM | Admit: 2018-10-14 | Discharge: 2018-10-14 | Disposition: A | Discharge status: Home / Self Care | Payer: Medicaid Other | Attending: Emergency Medicine | Admitting: Emergency Medicine

## 2018-10-14 ENCOUNTER — Encounter (HOSPITAL_COMMUNITY): Payer: Self-pay

## 2018-10-14 ENCOUNTER — Other Ambulatory Visit: Payer: Self-pay

## 2018-10-14 DIAGNOSIS — J0100 Acute maxillary sinusitis, unspecified: Secondary | ICD-10-CM

## 2018-10-14 DIAGNOSIS — F419 Anxiety disorder, unspecified: Secondary | ICD-10-CM

## 2018-10-14 LAB — POCT I-STAT TROPONIN I
Troponin i, poc: 0.01 ng/mL (ref 0.00–0.08)
Troponin i, poc: 0 ng/mL (ref 0.00–0.08)

## 2018-10-14 LAB — CBC
HCT: 43.9 % (ref 36.0–46.0)
Hemoglobin: 15 g/dL (ref 12.0–15.0)
MCH: 34.1 pg — AB (ref 26.0–34.0)
MCHC: 34.2 g/dL (ref 30.0–36.0)
MCV: 99.8 fL (ref 80.0–100.0)
PLATELETS: 173 10*3/uL (ref 150–400)
RBC: 4.4 MIL/uL (ref 3.87–5.11)
RDW: 12.4 % (ref 11.5–15.5)
WBC: 9.1 10*3/uL (ref 4.0–10.5)
nRBC: 0 % (ref 0.0–0.2)

## 2018-10-14 LAB — BASIC METABOLIC PANEL
Anion gap: 11 (ref 5–15)
BUN: 19 mg/dL (ref 6–20)
CO2: 21 mmol/L — AB (ref 22–32)
Calcium: 9 mg/dL (ref 8.9–10.3)
Chloride: 107 mmol/L (ref 98–111)
Creatinine, Ser: 0.64 mg/dL (ref 0.44–1.00)
GFR calc Af Amer: >60 mL/min (ref >60)
GFR calc non Af Amer: >60 mL/min (ref >60)
GLUCOSE: 245 mg/dL — AB (ref 70–99)
Potassium: 3.5 mmol/L (ref 3.5–5.1)
Sodium: 139 mmol/L (ref 135–145)

## 2018-10-14 LAB — I-STAT BETA HCG BLOOD, ED (NOT ORDERABLE): I-stat hCG, quantitative: <5 m[IU]/mL (ref <5)

## 2018-10-14 MED ORDER — SODIUM CHLORIDE 0.9% FLUSH: 3.0000 mL | Freq: Once | INTRAVENOUS | Status: DC

## 2018-10-14 MED ORDER — DOXYCYCLINE HYCLATE 100 MG PO CAPS: 100.0000 mg | ORAL_CAPSULE | Freq: Two times a day (BID) | ORAL | 0 refills | Status: DC

## 2018-10-14 MED ORDER — ACETAMINOPHEN 500 MG PO TABS
1000.0000 mg | ORAL_TABLET | Freq: Once | ORAL | Status: AC
  Administered 2018-10-14: 1000 mg via ORAL
  Filled 2018-10-14: qty 2
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
## 2018-10-14 NOTE — ED Notes (Signed)
Bed: WTR5 Expected date:  Expected time:  Means of arrival:  Comments: 

## 2018-10-14 NOTE — ED Triage Notes (Signed)
Pt reports that she has been under increased stress recently. Her mother died last week as well as other family problems. She states that she started experiencing chest tightness and pain yesterday, but thinks that it is anxiety. She also endorses back pain and difficulty getting a deep breath.

## 2018-10-27 ENCOUNTER — Emergency Department (HOSPITAL_COMMUNITY): Payer: Medicaid Other

## 2018-10-27 ENCOUNTER — Other Ambulatory Visit: Payer: Self-pay

## 2018-10-27 ENCOUNTER — Encounter (HOSPITAL_COMMUNITY): Payer: Self-pay | Admitting: Emergency Medicine

## 2018-10-27 ENCOUNTER — Emergency Department (HOSPITAL_COMMUNITY)
Admission: EM | Admit: 2018-10-27 | Discharge: 2018-10-27 | Disposition: A | Discharge status: Home / Self Care | Payer: Medicaid Other | Attending: Emergency Medicine | Admitting: Emergency Medicine

## 2018-10-27 DIAGNOSIS — R0600 Dyspnea, unspecified: Secondary | ICD-10-CM | POA: Diagnosis present

## 2018-10-27 DIAGNOSIS — I10 Essential (primary) hypertension: Secondary | ICD-10-CM | POA: Diagnosis not present

## 2018-10-27 DIAGNOSIS — Z79899 Other long term (current) drug therapy: Secondary | ICD-10-CM | POA: Insufficient documentation

## 2018-10-27 DIAGNOSIS — R69 Illness, unspecified: Secondary | ICD-10-CM

## 2018-10-27 DIAGNOSIS — Z9049 Acquired absence of other specified parts of digestive tract: Secondary | ICD-10-CM | POA: Diagnosis not present

## 2018-10-27 DIAGNOSIS — J45909 Unspecified asthma, uncomplicated: Secondary | ICD-10-CM | POA: Diagnosis not present

## 2018-10-27 DIAGNOSIS — J449 Chronic obstructive pulmonary disease, unspecified: Secondary | ICD-10-CM | POA: Insufficient documentation

## 2018-10-27 DIAGNOSIS — F329 Major depressive disorder, single episode, unspecified: Secondary | ICD-10-CM | POA: Diagnosis not present

## 2018-10-27 DIAGNOSIS — J111 Influenza due to unidentified influenza virus with other respiratory manifestations: Secondary | ICD-10-CM | POA: Diagnosis not present

## 2018-10-27 DIAGNOSIS — E119 Type 2 diabetes mellitus without complications: Secondary | ICD-10-CM | POA: Diagnosis not present

## 2018-10-27 LAB — I-STAT BETA HCG BLOOD, ED (NOT ORDERABLE): I-stat hCG, quantitative: <5 m[IU]/mL (ref <5)

## 2018-10-27 LAB — CBC
HCT: 44.3 % (ref 36.0–46.0)
Hemoglobin: 14.8 g/dL (ref 12.0–15.0)
MCH: 33.5 pg (ref 26.0–34.0)
MCHC: 33.4 g/dL (ref 30.0–36.0)
MCV: 100.2 fL — ABNORMAL HIGH (ref 80.0–100.0)
Platelets: 164 10*3/uL (ref 150–400)
RBC: 4.42 MIL/uL (ref 3.87–5.11)
RDW: 12.4 % (ref 11.5–15.5)
WBC: 8.6 10*3/uL (ref 4.0–10.5)
nRBC: 0 % (ref 0.0–0.2)

## 2018-10-27 LAB — POCT I-STAT TROPONIN I: TROPONIN I, POC: 0 ng/mL (ref 0.00–0.08)

## 2018-10-27 LAB — BASIC METABOLIC PANEL
Anion gap: 7 (ref 5–15)
BUN: 15 mg/dL (ref 6–20)
CALCIUM: 9.3 mg/dL (ref 8.9–10.3)
CO2: 26 mmol/L (ref 22–32)
Chloride: 105 mmol/L (ref 98–111)
Creatinine, Ser: 0.73 mg/dL (ref 0.44–1.00)
GFR calc Af Amer: >60 mL/min (ref >60)
GFR calc non Af Amer: >60 mL/min (ref >60)
Glucose, Bld: 197 mg/dL — ABNORMAL HIGH (ref 70–99)
Potassium: 3.4 mmol/L — ABNORMAL LOW (ref 3.5–5.1)
Sodium: 138 mmol/L (ref 135–145)

## 2018-10-27 MED ORDER — SODIUM CHLORIDE 0.9% FLUSH: 3.0000 mL | Freq: Once | INTRAVENOUS | Status: DC

## 2018-10-27 NOTE — ED Provider Notes (Signed)
[facility]-EMERGENCY DEPT Provider Note   CSN: [id number] Arrival date & time: 10/27/18  1654     History   Chief Complaint No chief complaint on file.   HPI Susan Hogan is a 58 y.o. female.  HPI Patient presents with multiple complaints. Seems as though her primary concern is dyspnea, but she also describes diffuse fatigue, soreness, without focal pain, including chest pain or abdominal pain. Onset seems to be the least few days ago, possibly few weeks ago. She notes history of multiple medical issues including COPD, does not smoke. She also notes that her mother died within the past few weeks, and she is having difficulty with grief. This illness no new medication take, no clear alleviating or exacerbating factors, she presents today due to the persistency of the mild dyspnea.  Past Medical History:  Diagnosis Date  . Anemia   . Arthritis    RA "states does not see Rheumatologist"  . Asthma   . Carpal tunnel syndrome    bilaterally  . Complication of anesthesia    difficulty breathing  . COPD (chronic obstructive pulmonary disease) (HCC)   . Degenerative disk disease    back  . Depression   . Diabetes mellitus   . Fibromyalgia   . GERD (gastroesophageal reflux disease)   . Headache(784.0)    migraines  . Heart murmur   . Hepatitis    In followup treatment for hepatitis C  . Hypertension    no medication for at this time, sees Dr. Dewight Williams Pcp  . Hypotensive episode    hx of  . Irritable bowel syndrome   . Kidney stone    with hematuria  . Recurrent upper respiratory infection (URI)   . Shortness of breath   . Syncope     Patient Active Problem List   Diagnosis Date Noted  . Seasonal allergic rhinitis due to pollen 03/25/2018  . Adnexal cyst 03/25/2018  . Morbid obesity (HCC) 03/25/2018  . Essential hypertension 03/25/2018  . Pure hypercholesterolemia 03/25/2018  . Sleep-disordered breathing 03/25/2018  . Vitamin D  deficiency 03/25/2018  . Bilateral lower extremity edema 03/25/2018  . Acute meniscal tear of left knee 11/24/2016  . Chronic hepatitis C without hepatic coma (HCC) 11/24/2016  . Fatty liver 11/24/2016  . H/O degenerative disc disease 11/24/2016  . COPD (chronic obstructive pulmonary disease) (HCC) 10/22/2016  . Type 2 diabetes mellitus, cannot tolerate Metformin 07/25/2014  . Fibromyalgia 07/25/2014  . MDD (major depressive disorder), recurrent severe, without psychosis (HCC) 03/17/2013  . Hepatitis C 08/02/2007  . IBS (irritable bowel syndrome) 08/02/2007  . Mild persistent asthma with acute exacerbation 09/21/1962    Past Surgical History:  Procedure Laterality Date  . CHOLECYSTECTOMY    . DILATION AND CURETTAGE OF UTERUS    . Left knee surgery    . LIVER BIOPSY    . TONSILLECTOMY    . TUBAL LIGATION       OB History   No obstetric history on file.      Home Medications    Prior to Admission medications   Medication Sig Start Date End Date Taking? Authorizing Provider  aspirin-acetaminophen-caffeine (EXCEDRIN MIGRAINE) 250-250-65 MG tablet Take 1 tablet by mouth every 6 (six) hours as needed for headache.   Yes [provider]  triamterene-hydrochlorothiazide (MAXZIDE-25) 37.5-25 MG tablet Take 1 tablet by mouth daily. 03/25/18  Yes Wallace, Erica, DO  albuterol (PROVENTIL HFA;VENTOLIN HFA) 108 (90 Base) MCG/ACT inhaler Inhale 1-2 puffs into the   lungs every 6 (six) hours as needed for wheezing or shortness of breath. Patient not taking: Reported on 10/14/2018 03/03/18   Couture, Cortni S, PA-C  baclofen (LIORESAL) 10 MG tablet Take 1 tablet (10 mg total) by mouth 3 (three) times daily. Patient not taking: Reported on 07/05/2018 05/26/18   Orma Flaming, MD  blood glucose meter kit and supplies KIT Dispense based on patient and insurance preference. Use up to four times daily as directed. (FOR ICD-9 250.00, 250.01). 03/25/18   Briscoe Deutscher, DO  doxycycline (VIBRAMYCIN)  100 MG capsule Take 1 capsule (100 mg total) by mouth 2 (two) times daily. Patient not taking: Reported on 10/27/2018 10/14/18   Montine Circle, PA-C  escitalopram (LEXAPRO) 10 MG tablet TAKE 1 TABLET(10 MG) BY MOUTH DAILY Patient not taking: Reported on 07/05/2018 06/01/18   Briscoe Deutscher, DO  glucose blood (ACCU-CHEK ACTIVE STRIPS) test strip Use as instructed 04/15/16   Nona Dell, PA-C  ibuprofen (ADVIL,MOTRIN) 200 MG tablet Take 400 mg by mouth every 6 (six) hours as needed for headache or mild pain.    [provider]  ondansetron (ZOFRAN ODT) 4 MG disintegrating tablet 2m ODT q4 hours prn nausea/vomit Patient not taking: Reported on 10/14/2018 10/04/18   FJacqlyn Larsen PA-C    Family History Family History  Problem Relation Age of Onset  . Heart disease Mother   . Diabetes Mother   . Asthma Mother   . Anemia Mother   . Colon cancer Maternal Uncle   . Heart disease Maternal Uncle   . Heart disease Maternal Grandmother   . Diabetes Maternal Grandmother   . Asthma Maternal Grandmother     Social History Social History   Tobacco Use  . Smoking status: Never Smoker  . Smokeless tobacco: Never Used  Substance Use Topics  . Alcohol use: No    Alcohol/week: 0.0 standard drinks  . Drug use: No     Allergies   Anesthetic ether [ether]; Nubain [nalbuphine hcl]; Penicillins; Advair diskus [fluticasone-salmeterol]; and Lactose intolerance (gi)   Review of Systems Review of Systems  Constitutional:       Per HPI, otherwise negative  HENT:       Per HPI, otherwise negative  Respiratory:       Per HPI, otherwise negative  Cardiovascular:       Per HPI, otherwise negative  Gastrointestinal: Negative for vomiting.  Endocrine:       Negative aside from HPI  Genitourinary: Positive for frequency. Negative for dysuria.  Musculoskeletal:       Per HPI, otherwise negative  Skin: Negative.   Neurological: Negative for syncope.     Physical  Exam Updated Vital Signs BP 135/70 (BP Location: Right Arm)   Pulse 72   Temp 97.7 F (36.5 C) (Oral)   Resp (!) 23   Ht 4' 11" (1.499 m)   Wt 104.3 kg   LMP  (LMP Unknown)   SpO2 98%   BMI 46.45 kg/m   Physical Exam Vitals signs and nursing note reviewed.  Constitutional:      General: She is not in acute distress.    Appearance: She is well-developed.  HENT:     Head: Normocephalic and atraumatic.  Eyes:     Conjunctiva/sclera: Conjunctivae normal.  Cardiovascular:     Rate and Rhythm: Normal rate and regular rhythm.  Pulmonary:     Effort: Pulmonary effort is normal. No respiratory distress.     Breath sounds: Normal breath sounds. No  stridor. No wheezing.  Abdominal:     General: There is no distension.  Skin:    General: Skin is warm and dry.  Neurological:     Mental Status: She is alert and oriented to person, place, and time.     Cranial Nerves: No cranial nerve deficit.      ED Treatments / Results  Labs (all labs ordered are listed, but only abnormal results are displayed) Labs Reviewed  BASIC METABOLIC PANEL - Abnormal; Notable for the following components:      Result Value   Potassium 3.4 (*)    Glucose, Bld 197 (*)    All other components within normal limits  CBC - Abnormal; Notable for the following components:   MCV 100.2 (*)    All other components within normal limits  I-STAT TROPONIN, ED  I-STAT BETA HCG BLOOD, ED ([facility], [facility], [facility] ONLY)  POCT I-STAT TROPONIN I  I-STAT BETA HCG BLOOD, ED (NOT ORDERABLE)    EKG EKG Interpretation  Date/Time:  Thursday October 27 2018 17:33:24 EST Ventricular Rate:  74 PR Interval:    QRS Duration: 98 QT Interval:  404 QTC Calculation: 449 R Axis:   55 Text Interpretation:  Sinus rhythm Borderline repolarization abnormality T wave abnormality No significant change since last tracing Abnormal ekg Confirmed by ,  ([id number]) on 10/27/2018 7:59:07 PM   Radiology Dg Chest 2 View  Result Date:  10/27/2018 CLINICAL DATA:  57-year-old female with headache, fatigue, polyuria. EXAM: CHEST - 2 VIEW COMPARISON:  Chest radiographs 10/14/2018 and earlier. FINDINGS: Stable lung volumes since 2019. Mediastinal contours remain normal. Visualized tracheal air column is within normal limits. The lungs appear stable and clear. No pneumothorax or pleural effusion. Negative visible bowel gas pattern. Stable cholecystectomy clips. No acute osseous abnormality identified. IMPRESSION: Negative.  No acute cardiopulmonary abnormality. Electronically Signed   By: H  Hall M.D.   On: 10/27/2018 18:17    Procedures Procedures (including critical care time)  Medications Ordered in ED Medications  sodium chloride flush (NS) 0.9 % injection 3 mL (has no administration in time range)     Initial Impression / Assessment and Plan / ED Course  I have reviewed the triage vital signs and the nursing notes.  Pertinent labs & imaging results that were available during my care of the patient were reviewed by me and considered in my medical decision making (see chart for details).     11:11 PM Patient in no distress, awake, alert. We discussed all findings, including no substantial lab abnormalities, no leukocytosis, no fever, no substantial abdominal pain, no hypoxia, no ongoing respiratory difficulty.  Mother suspicion for viral etiology given her reassuring exam, vitals, labs Also some suspicion for grief component given the recent death of her mother. With reassuring findings here, the patient was discharged in stable condition.  Final Clinical Impressions(s) / ED Diagnoses   Final diagnoses:  Influenza-like illness     , , MD 10/27/18 2312  

## 2018-10-27 NOTE — ED Triage Notes (Signed)
Pt BIB POV warm/pink/clammy. Pt c/o fatigue, HA and polyuria x2 days. Pt has been out of lasix for a few months with BLE. Hx CHF, DM, HTN.

## 2018-10-27 NOTE — ED Notes (Signed)
Ambulatory to the bathroom without assistance, steady gate.

## 2018-10-27 NOTE — Discharge Instructions (Signed)
As discussed, your evaluation today has been largely reassuring.  But, it is important that you monitor your condition carefully, and do not hesitate to return to the ED if you develop new, or concerning changes in your condition. ? ?Otherwise, please follow-up with your physician for appropriate ongoing care. ? ?

## 2018-10-28 ENCOUNTER — Telehealth: Payer: Self-pay | Admitting: Pharmacist

## 2018-10-28 NOTE — Telephone Encounter (Signed)
Great.  Thanks

## 2018-10-28 NOTE — Telephone Encounter (Signed)
Patient saw Tammy SoursGreg back in August for her Hepatitis C infection. She was lost to follow-up.  Called patient to see if she was still interested in treatment, and she is. She will need updated labs, so I made her a f/u appointment with me for 2/20 at 4pm to discuss treatment.

## 2018-11-02 ENCOUNTER — Encounter (HOSPITAL_COMMUNITY): Payer: Self-pay | Admitting: Emergency Medicine

## 2018-11-02 ENCOUNTER — Emergency Department (HOSPITAL_COMMUNITY)
Admission: EM | Admit: 2018-11-02 | Discharge: 2018-11-02 | Disposition: A | Discharge status: Home / Self Care | Payer: Medicaid Other | Attending: Emergency Medicine | Admitting: Emergency Medicine

## 2018-11-02 ENCOUNTER — Other Ambulatory Visit: Payer: Self-pay

## 2018-11-02 ENCOUNTER — Emergency Department (HOSPITAL_COMMUNITY): Payer: Medicaid Other

## 2018-11-02 DIAGNOSIS — Z79899 Other long term (current) drug therapy: Secondary | ICD-10-CM | POA: Insufficient documentation

## 2018-11-02 DIAGNOSIS — I10 Essential (primary) hypertension: Secondary | ICD-10-CM | POA: Insufficient documentation

## 2018-11-02 DIAGNOSIS — J449 Chronic obstructive pulmonary disease, unspecified: Secondary | ICD-10-CM | POA: Diagnosis not present

## 2018-11-02 DIAGNOSIS — R51 Headache: Secondary | ICD-10-CM | POA: Diagnosis present

## 2018-11-02 DIAGNOSIS — R519 Headache, unspecified: Secondary | ICD-10-CM

## 2018-11-02 DIAGNOSIS — E119 Type 2 diabetes mellitus without complications: Secondary | ICD-10-CM | POA: Diagnosis not present

## 2018-11-02 LAB — CBG MONITORING, ED: Glucose-Capillary: 102 mg/dL — ABNORMAL HIGH (ref 70–99)

## 2018-11-02 MED ORDER — METOCLOPRAMIDE HCL 5 MG/ML IJ SOLN
10.0000 mg | Freq: Once | INTRAMUSCULAR | Status: AC
  Administered 2018-11-02: 10 mg via INTRAVENOUS
  Filled 2018-11-02: qty 2

## 2018-11-02 MED ORDER — DIPHENHYDRAMINE HCL 50 MG/ML IJ SOLN
25.0000 mg | Freq: Once | INTRAMUSCULAR | Status: AC
  Administered 2018-11-02: 25 mg via INTRAVENOUS
  Filled 2018-11-02: qty 1

## 2018-11-02 MED ORDER — BUTALBITAL-APAP-CAFFEINE 50-325-40 MG PO TABS: 1.0000 | ORAL_TABLET | Freq: Four times a day (QID) | ORAL | 0 refills | Status: DC | PRN

## 2018-11-02 MED ORDER — ACETAMINOPHEN 500 MG PO TABS
1000.0000 mg | ORAL_TABLET | Freq: Once | ORAL | Status: AC
  Administered 2018-11-02: 1000 mg via ORAL
  Filled 2018-11-02: qty 2

## 2018-11-02 MED ORDER — KETOROLAC TROMETHAMINE 30 MG/ML IJ SOLN
30.0000 mg | Freq: Once | INTRAMUSCULAR | Status: AC
  Administered 2018-11-02: 30 mg via INTRAVENOUS
  Filled 2018-11-02: qty 1

## 2018-11-02 MED ORDER — LACTATED RINGERS IV BOLUS
1000.0000 mL | Freq: Once | INTRAVENOUS | Status: AC
  Administered 2018-11-02: 1000 mL via INTRAVENOUS

## 2018-11-02 NOTE — ED Triage Notes (Signed)
Patient with headache for the last few days.  She states that she has been taking meds OTC with no relief.  She has been eating only liquids.  Patient states she woke up in a pool of sweat and came in here.

## 2018-11-02 NOTE — ED Provider Notes (Signed)
[facility] EMERGENCY DEPARTMENT Provider Note   CSN: [id number] Arrival date & time: 11/02/18  0243     History   Chief Complaint Chief Complaint  Patient presents with  . Migraine    HPI Dorthy Bresee is a 58 y.o. female.  The history is provided by the patient and medical records. No language interpreter was used.  Migraine      58 year old female with history of migraine, COPD, diabetes, fibromyalgia's, IBS, depression presenting for evaluation of headache.  Patient report gradual onset of persistent throbbing diffuse headache ongoing for the past 6 days.  Headaches associated with nausea, sinus congestion, throat irritation, occasional sneezing.  Headache is similar to migraine but has intensified.  She endorsed having hot flash.  She has been taking Advil without relief.  She mentioned been seen in the ED 6 days ago for symptom but no specific treatment tried headache still persist.  Patient denies confusion, diplopia, neck pain, fever focal numbness or weakness, or rash.  She does voice concern of brain aneurysm stating that her mom recently passed away from a brain aneurysm last month.  Patient had 2 Tylenols earlier today without relief. Past Medical History:  Diagnosis Date  . Anemia   . Arthritis    RA "states does not see Rheumatologist"  . Asthma   . Carpal tunnel syndrome    bilaterally  . Complication of anesthesia    difficulty breathing  . COPD (chronic obstructive pulmonary disease) ([place])   . Degenerative disk disease    back  . Depression   . Diabetes mellitus   . Fibromyalgia   . GERD (gastroesophageal reflux disease)   . Headache(784.0)    migraines  . Heart murmur   . Hepatitis    In followup treatment for hepatitis C  . Hypertension    no medication for at this time, sees Dr. Dinah Beers Pcp  . Hypotensive episode    hx of  . Irritable bowel syndrome   . Kidney stone    with hematuria  . Recurrent upper respiratory  infection (URI)   . Shortness of breath   . Syncope     Patient Active Problem List   Diagnosis Date Noted  . Seasonal allergic rhinitis due to pollen 03/25/2018  . Adnexal cyst 03/25/2018  . Morbid obesity ([place]) 03/25/2018  . Essential hypertension 03/25/2018  . Pure hypercholesterolemia 03/25/2018  . Sleep-disordered breathing 03/25/2018  . Vitamin D deficiency 03/25/2018  . Bilateral lower extremity edema 03/25/2018  . Acute meniscal tear of left knee 11/24/2016  . Chronic hepatitis C without hepatic coma ([place]) 11/24/2016  . Fatty liver 11/24/2016  . H/O degenerative disc disease 11/24/2016  . COPD (chronic obstructive pulmonary disease) ([place]) 10/22/2016  . Type 2 diabetes mellitus, cannot tolerate Metformin 07/25/2014  . Fibromyalgia 07/25/2014  . MDD (major depressive disorder), recurrent severe, without psychosis ([place]) 03/17/2013  . Hepatitis C 08/02/2007  . IBS (irritable bowel syndrome) 08/02/2007  . Mild persistent asthma with acute exacerbation 09/21/1962    Past Surgical History:  Procedure Laterality Date  . CHOLECYSTECTOMY    . DILATION AND CURETTAGE OF UTERUS    . Left knee surgery    . LIVER BIOPSY    . TONSILLECTOMY    . TUBAL LIGATION       OB History   No obstetric history on file.      Home Medications    Prior to Admission medications   Medication Sig Start Date End Date Taking?  Authorizing Provider  albuterol (PROVENTIL HFA;VENTOLIN HFA) 108 (90 Base) MCG/ACT inhaler Inhale 1-2 puffs into the lungs every 6 (six) hours as needed for wheezing or shortness of breath. Patient not taking: Reported on 10/14/2018 03/03/18   Couture, Cortni S, PA-C  aspirin-acetaminophen-caffeine (EXCEDRIN MIGRAINE) [phone] MG tablet Take 1 tablet by mouth every 6 (six) hours as needed for headache.    [provider]  baclofen (LIORESAL) 10 MG tablet Take 1 tablet (10 mg total) by mouth 3 (three) times daily. Patient not taking: Reported on 07/05/2018 05/26/18    Orma Flaming, MD  blood glucose meter kit and supplies KIT Dispense based on patient and insurance preference. Use up to four times daily as directed. (FOR ICD-9 250.00, 250.01). 03/25/18   Briscoe Deutscher, DO  doxycycline (VIBRAMYCIN) 100 MG capsule Take 1 capsule (100 mg total) by mouth 2 (two) times daily. Patient not taking: Reported on 10/27/2018 10/14/18   Montine Circle, PA-C  escitalopram (LEXAPRO) 10 MG tablet TAKE 1 TABLET(10 MG) BY MOUTH DAILY Patient not taking: Reported on 07/05/2018 06/01/18   Briscoe Deutscher, DO  glucose blood (ACCU-CHEK ACTIVE STRIPS) test strip Use as instructed 04/15/16   Nona Dell, PA-C  ibuprofen (ADVIL,MOTRIN) 200 MG tablet Take 400 mg by mouth every 6 (six) hours as needed for headache or mild pain.    [provider]  ondansetron (ZOFRAN ODT) 4 MG disintegrating tablet '4mg'$  ODT q4 hours prn nausea/vomit Patient not taking: Reported on 10/14/2018 10/04/18   Jacqlyn Larsen, PA-C  triamterene-hydrochlorothiazide (MAXZIDE-25) 37.5-25 MG tablet Take 1 tablet by mouth daily. 03/25/18   Briscoe Deutscher, DO    Family History Family History  Problem Relation Age of Onset  . Heart disease Mother   . Diabetes Mother   . Asthma Mother   . Anemia Mother   . Colon cancer Maternal Uncle   . Heart disease Maternal Uncle   . Heart disease Maternal Grandmother   . Diabetes Maternal Grandmother   . Asthma Maternal Grandmother     Social History Social History   Tobacco Use  . Smoking status: Never Smoker  . Smokeless tobacco: Never Used  Substance Use Topics  . Alcohol use: No    Alcohol/week: 0.0 standard drinks  . Drug use: No     Allergies   Anesthetic ether [ether]; Nubain [nalbuphine hcl]; Penicillins; Advair diskus [fluticasone-salmeterol]; and Lactose intolerance (gi)   Review of Systems Review of Systems  All other systems reviewed and are negative.    Physical Exam Updated Vital Signs BP (!) 144/86 (BP Location: Right  Arm)   Pulse 74   Temp 98.2 F (36.8 C) (Oral)   Resp 18   LMP  (LMP Unknown)   SpO2 97%   Physical Exam Vitals signs and nursing note reviewed.  Constitutional:      General: She is not in acute distress.    Appearance: She is well-developed.  HENT:     Head: Atraumatic.     Comments: Mild tenderness to the percussion of forehead and maxillary region bilaterally    Right Ear: Tympanic membrane normal.     Left Ear: Tympanic membrane normal.     Nose: Nose normal.     Mouth/Throat:     Mouth: Mucous membranes are moist.  Eyes:     Extraocular Movements: Extraocular movements intact.     Conjunctiva/sclera: Conjunctivae normal.     Pupils: Pupils are equal, round, and reactive to light.  Neck:     Musculoskeletal:  Neck supple. No neck rigidity.  Cardiovascular:     Rate and Rhythm: Normal rate and regular rhythm.     Pulses: Normal pulses.     Heart sounds: Normal heart sounds.  Pulmonary:     Breath sounds: No wheezing.  Abdominal:     Palpations: Abdomen is soft.     Tenderness: There is no abdominal tenderness.  Skin:    Findings: No rash.  Neurological:     Mental Status: She is alert and oriented to person, place, and time.     Comments: Neurologic exam:  Speech clear, pupils equal round reactive to light, extraocular movements intact  Normal peripheral visual fields Cranial nerves III through XII normal including no facial droop Follows commands, moves all extremities x4, normal strength to bilateral upper and lower extremities at all major muscle groups including grip Sensation normal to light touch  Coordination intact, no limb ataxia, finger-nose-finger normal Rapid alternating movements normal No pronator drift Gait normal       ED Treatments / Results  Labs (all labs ordered are listed, but only abnormal results are displayed) Labs Reviewed  CBG MONITORING, ED - Abnormal; Notable for the following components:      Result Value    Glucose-Capillary 102 (*)    All other components within normal limits    EKG None  Radiology Ct Head Wo Contrast  Result Date: 11/02/2018 CLINICAL DATA:  Headache and dizziness EXAM: CT HEAD WITHOUT CONTRAST TECHNIQUE: Contiguous axial images were obtained from the base of the skull through the vertex without intravenous contrast. COMPARISON:  Brain MRI April 10, 2011. FINDINGS: Brain: The ventricles are normal in size and configuration. There is no intracranial mass, hemorrhage, extra-axial fluid collection, or midline shift. Brain parenchyma appears unremarkable. No acute infarct evident. Vascular: No hyperdense vessel. There is calcification in each carotid siphon region. Skull: Bony calvarium peers intact. Sinuses/Orbits: Visualized paranasal sinuses are clear. Visualized orbits appear symmetric bilaterally. Other: Mastoid air cells are clear. IMPRESSION: Foci of arterial vascular calcification noted. Study otherwise unremarkable. Electronically Signed   By: Lowella Grip III M.D.   On: 11/02/2018 08:07    Procedures Procedures (including critical care time)  Medications Ordered in ED Medications  acetaminophen (TYLENOL) tablet 1,000 mg (1,000 mg Oral Given 11/02/18 6734)     Initial Impression / Assessment and Plan / ED Course  I have reviewed the triage vital signs and the nursing notes.  Pertinent labs & imaging results that were available during my care of the patient were reviewed by me and considered in my medical decision making (see chart for details).     BP (!) 144/86 (BP Location: Right Arm)   Pulse 74   Temp 98.2 F (36.8 C) (Oral)   Resp 18   LMP  (LMP Unknown)   SpO2 97%    Final Clinical Impressions(s) / ED Diagnoses   Final diagnoses:  Sinus headache    ED Discharge Orders         Ordered    butalbital-acetaminophen-caffeine (FIORICET, ESGIC) 50-325-40 MG tablet  Every 6 hours PRN     11/02/18 0932         7:04 AM Patient is going through  grief with her mother recently passed away from a brain aneurysm.  She does endorse worsening progressive headache.  She has history of migraine.  She has no focal neuro deficit on exam.  No fever or nuchal rigidity concerning for meningitis.  No acute onset of headache concerning  for subarachnoid hemorrhage.  Will obtain head CT scan, will give migraine cocktail.  9:26 AM Head CT without acute changes. Pt report minimal improvement of headache with migraine cocktail.  She felt her sxs is more sinus driven.  She does feel comfortable going home with decongestant and will f/u with pcp for further care. Return precaution discussed.    Domenic Moras, PA-C 11/02/18 5400    Ripley Fraise, MD 11/03/18 [id number]

## 2018-11-02 NOTE — ED Notes (Signed)
Check CBG 102, RN Woody informed

## 2018-11-02 NOTE — ED Notes (Signed)
Patient transported to CT 

## 2018-11-10 ENCOUNTER — Ambulatory Visit: Payer: Medicaid Other | Admitting: Pharmacist

## 2018-11-23 ENCOUNTER — Ambulatory Visit: Payer: Medicaid Other | Admitting: Pharmacist

## 2018-11-23 ENCOUNTER — Telehealth: Payer: Self-pay | Admitting: Pharmacy Technician

## 2018-11-23 NOTE — Telephone Encounter (Signed)
[facility] Patient Product/process development scientist completed.    The patient in insured through [facility]  Readiness to treat signed form is in her file from a previous visit.  A Hepatitis C viral load is needed before filing with Medicaid.  We will continue to follow until approved.  Susan Hogan. Dimas Aguas CPhT Specialty Pharmacy Patient [facility] for Infectious Disease Phone: [phone] Fax:  [phone]

## 2018-11-25 ENCOUNTER — Telehealth: Payer: Self-pay | Admitting: Pharmacy Technician

## 2018-11-25 NOTE — Telephone Encounter (Signed)
I returned Ms. Ureta voicemail left last night to reschedule the office appointment that she missed yesterday.  She said that she was feeling bad and would call us later to reschedule.  Netty Starring. Dimas Aguas CPhT Specialty Pharmacy Patient [facility] for Infectious Disease Phone: [phone] Fax:  [phone]

## 2018-11-25 NOTE — Telephone Encounter (Signed)
Great.  Thanks

## 2018-12-05 ENCOUNTER — Encounter (HOSPITAL_COMMUNITY): Payer: Self-pay | Admitting: *Deleted

## 2018-12-05 ENCOUNTER — Emergency Department (HOSPITAL_COMMUNITY)
Admission: EM | Admit: 2018-12-05 | Discharge: 2018-12-05 | Disposition: A | Discharge status: Home / Self Care | Payer: Medicaid Other | Attending: Emergency Medicine | Admitting: Emergency Medicine

## 2018-12-05 ENCOUNTER — Other Ambulatory Visit: Payer: Self-pay

## 2018-12-05 DIAGNOSIS — J449 Chronic obstructive pulmonary disease, unspecified: Secondary | ICD-10-CM | POA: Diagnosis not present

## 2018-12-05 DIAGNOSIS — R51 Headache: Secondary | ICD-10-CM | POA: Diagnosis present

## 2018-12-05 DIAGNOSIS — E119 Type 2 diabetes mellitus without complications: Secondary | ICD-10-CM | POA: Diagnosis not present

## 2018-12-05 DIAGNOSIS — Z79899 Other long term (current) drug therapy: Secondary | ICD-10-CM | POA: Diagnosis not present

## 2018-12-05 DIAGNOSIS — R519 Headache, unspecified: Secondary | ICD-10-CM

## 2018-12-05 DIAGNOSIS — I10 Essential (primary) hypertension: Secondary | ICD-10-CM | POA: Diagnosis not present

## 2018-12-05 LAB — BASIC METABOLIC PANEL
Anion gap: 9 (ref 5–15)
BUN: 9 mg/dL (ref 6–20)
CO2: 24 mmol/L (ref 22–32)
Calcium: 9.4 mg/dL (ref 8.9–10.3)
Chloride: 108 mmol/L (ref 98–111)
Creatinine, Ser: 0.69 mg/dL (ref 0.44–1.00)
GFR calc Af Amer: >60 mL/min (ref >60)
GFR calc non Af Amer: >60 mL/min (ref >60)
Glucose, Bld: 167 mg/dL — ABNORMAL HIGH (ref 70–99)
POTASSIUM: 3.6 mmol/L (ref 3.5–5.1)
Sodium: 141 mmol/L (ref 135–145)

## 2018-12-05 LAB — CBC
HCT: 47.8 % — ABNORMAL HIGH (ref 36.0–46.0)
Hemoglobin: 16.4 g/dL — ABNORMAL HIGH (ref 12.0–15.0)
MCH: 32.9 pg (ref 26.0–34.0)
MCHC: 34.3 g/dL (ref 30.0–36.0)
MCV: 95.8 fL (ref 80.0–100.0)
Platelets: 182 10*3/uL (ref 150–400)
RBC: 4.99 MIL/uL (ref 3.87–5.11)
RDW: 12.2 % (ref 11.5–15.5)
WBC: 9.5 10*3/uL (ref 4.0–10.5)
nRBC: 0 % (ref 0.0–0.2)

## 2018-12-05 MED ORDER — PROCHLORPERAZINE EDISYLATE 10 MG/2ML IJ SOLN
10.0000 mg | Freq: Once | INTRAMUSCULAR | Status: AC
  Administered 2018-12-05: 10 mg via INTRAVENOUS
  Filled 2018-12-05: qty 2

## 2018-12-05 MED ORDER — DIPHENHYDRAMINE HCL 50 MG/ML IJ SOLN
25.0000 mg | Freq: Once | INTRAMUSCULAR | Status: AC
  Administered 2018-12-05: 25 mg via INTRAVENOUS
  Filled 2018-12-05: qty 1

## 2018-12-05 MED ORDER — KETOROLAC TROMETHAMINE 15 MG/ML IJ SOLN
15.0000 mg | Freq: Once | INTRAMUSCULAR | Status: AC
  Administered 2018-12-05: 15 mg via INTRAVENOUS
  Filled 2018-12-05: qty 1

## 2018-12-05 MED ORDER — SODIUM CHLORIDE 0.9 % IV BOLUS
1000.0000 mL | Freq: Once | INTRAVENOUS | Status: AC
  Administered 2018-12-05: 1000 mL via INTRAVENOUS

## 2018-12-05 NOTE — Discharge Instructions (Signed)
Please take your allergy medication daily.  Please use a nasal steroid spray such as flonase.    You may not drive for the next 12 hours.

## 2018-12-05 NOTE — ED Triage Notes (Signed)
States her allergies have been flaring up she has been taking OTC medications for 2 days and has slept, woke up this am with headache and after using advil unable to get rid of the headache.

## 2018-12-05 NOTE — ED Provider Notes (Signed)
[facility] Provider Note   CSN: [id number] Arrival date & time: 12/05/18  0353    History   Chief Complaint Chief Complaint  Patient presents with  . Headache    HPI Susan Hogan is a 58 y.o. female with a past medical history of COPD, hypertension, hepatitis C, GERD, seasonal allergies, who presents today for evaluation of headache.  She reports that for the past 2 days her allergies have been flaring up when she sleeps.  She intermittently takes Zyrtec, however has not taken any in the past day.  She has intermittently been taking Advil for her headaches which temporarily help.  She states that in the past she has had antibiotics which help her headaches.  This headache only started in the past 48 hours however.  She denies any fevers.  Her headache is between both of her eyes.  She denies any vision changes.     HPI  Past Medical History:  Diagnosis Date  . Anemia   . Arthritis    RA "states does not see Rheumatologist"  . Asthma   . Carpal tunnel syndrome    bilaterally  . Complication of anesthesia    difficulty breathing  . COPD (chronic obstructive pulmonary disease) ([place])   . Degenerative disk disease    back  . Depression   . Diabetes mellitus   . Fibromyalgia   . GERD (gastroesophageal reflux disease)   . Headache(784.0)    migraines  . Heart murmur   . Hepatitis    In followup treatment for hepatitis C  . Hypertension    no medication for at this time, sees Dr. Dinah Beers Pcp  . Hypotensive episode    hx of  . Irritable bowel syndrome   . Kidney stone    with hematuria  . Recurrent upper respiratory infection (URI)   . Shortness of breath   . Syncope     Patient Active Problem List   Diagnosis Date Noted  . Seasonal allergic rhinitis due to pollen 03/25/2018  . Adnexal cyst 03/25/2018  . Morbid obesity ([place]) 03/25/2018  . Essential hypertension 03/25/2018  . Pure hypercholesterolemia 03/25/2018   . Sleep-disordered breathing 03/25/2018  . Vitamin D deficiency 03/25/2018  . Bilateral lower extremity edema 03/25/2018  . Acute meniscal tear of left knee 11/24/2016  . Chronic hepatitis C without hepatic coma ([place]) 11/24/2016  . Fatty liver 11/24/2016  . H/O degenerative disc disease 11/24/2016  . COPD (chronic obstructive pulmonary disease) ([place]) 10/22/2016  . Type 2 diabetes mellitus, cannot tolerate Metformin 07/25/2014  . Fibromyalgia 07/25/2014  . MDD (major depressive disorder), recurrent severe, without psychosis ([place]) 03/17/2013  . Hepatitis C 08/02/2007  . IBS (irritable bowel syndrome) 08/02/2007  . Mild persistent asthma with acute exacerbation 09/21/1962    Past Surgical History:  Procedure Laterality Date  . CHOLECYSTECTOMY    . DILATION AND CURETTAGE OF UTERUS    . Left knee surgery    . LIVER BIOPSY    . TONSILLECTOMY    . TUBAL LIGATION       OB History   No obstetric history on file.      Home Medications    Prior to Admission medications   Medication Sig Start Date End Date Taking? Authorizing Provider  albuterol (PROVENTIL HFA;VENTOLIN HFA) 108 (90 Base) MCG/ACT inhaler Inhale 1-2 puffs into the lungs every 6 (six) hours as needed for wheezing or shortness of breath. Patient not taking: Reported on 10/14/2018 03/03/18  Couture, Cortni S, PA-C  aspirin-acetaminophen-caffeine (EXCEDRIN MIGRAINE) [phone] MG tablet Take 1 tablet by mouth every 6 (six) hours as needed for headache.    [provider]  baclofen (LIORESAL) 10 MG tablet Take 1 tablet (10 mg total) by mouth 3 (three) times daily. Patient not taking: Reported on 07/05/2018 05/26/18   Orma Flaming, MD  blood glucose meter kit and supplies KIT Dispense based on patient and insurance preference. Use up to four times daily as directed. (FOR ICD-9 250.00, 250.01). 03/25/18   Briscoe Deutscher, DO  butalbital-acetaminophen-caffeine (FIORICET, ESGIC) [phone] MG tablet Take 1-2 tablets by mouth  every 6 (six) hours as needed for headache. 11/02/18 11/02/19  Domenic Moras, PA-C  doxycycline (VIBRAMYCIN) 100 MG capsule Take 1 capsule (100 mg total) by mouth 2 (two) times daily. Patient not taking: Reported on 10/27/2018 10/14/18   Montine Circle, PA-C  escitalopram (LEXAPRO) 10 MG tablet TAKE 1 TABLET(10 MG) BY MOUTH DAILY Patient not taking: Reported on 07/05/2018 06/01/18   Briscoe Deutscher, DO  glucose blood (ACCU-CHEK ACTIVE STRIPS) test strip Use as instructed 04/15/16   Nona Dell, PA-C  ibuprofen (ADVIL,MOTRIN) 200 MG tablet Take 400 mg by mouth every 6 (six) hours as needed for headache or mild pain.    [provider]  ondansetron (ZOFRAN ODT) 4 MG disintegrating tablet 19m ODT q4 hours prn nausea/vomit Patient not taking: Reported on 10/14/2018 10/04/18   FJacqlyn Larsen PA-C  triamterene-hydrochlorothiazide (MAXZIDE-25) 37.5-25 MG tablet Take 1 tablet by mouth daily. 03/25/18   WBriscoe Deutscher DO    Family History Family History  Problem Relation Age of Onset  . Heart disease Mother   . Diabetes Mother   . Asthma Mother   . Anemia Mother   . Colon cancer Maternal Uncle   . Heart disease Maternal Uncle   . Heart disease Maternal Grandmother   . Diabetes Maternal Grandmother   . Asthma Maternal Grandmother     Social History Social History   Tobacco Use  . Smoking status: Never Smoker  . Smokeless tobacco: Never Used  Substance Use Topics  . Alcohol use: No    Alcohol/week: 0.0 standard drinks  . Drug use: No     Allergies   Anesthetic ether [ether]; Nubain [nalbuphine hcl]; Penicillins; Advair diskus [fluticasone-salmeterol]; and Lactose intolerance (gi)   Review of Systems Review of Systems  Constitutional: Negative for chills and fever.  HENT: Positive for congestion, ear pain, postnasal drip and sore throat. Negative for sinus pressure and sinus pain.   Eyes: Negative for photophobia and visual disturbance.  Respiratory: Negative for chest  tightness and shortness of breath.   Cardiovascular: Negative for chest pain.  Gastrointestinal: Negative for abdominal pain, diarrhea, nausea and vomiting.  Musculoskeletal: Negative for back pain and myalgias.  Neurological: Positive for headaches. Negative for seizures, speech difficulty and weakness.  Psychiatric/Behavioral: Negative for confusion.  All other systems reviewed and are negative.    Physical Exam Updated Vital Signs BP (!) 147/81   Pulse 80   Temp (!) 97.4 F (36.3 C) (Oral)   Resp 16   Ht _0  (1.499 m)   Wt 107 kg   LMP  (LMP Unknown)   SpO2 93%   BMI 47.67 kg/m   Physical Exam Vitals signs and nursing note reviewed.  Constitutional:      General: She is not in acute distress.    Appearance: She is well-developed.  HENT:     Head: Normocephalic and atraumatic.  Right Ear: Tympanic membrane, ear canal and external ear normal.     Left Ear: Tympanic membrane, ear canal and external ear normal.     Nose: Nose normal.     Right Sinus: No maxillary sinus tenderness or frontal sinus tenderness.     Left Sinus: No maxillary sinus tenderness or frontal sinus tenderness.     Mouth/Throat:     Lips: Pink.     Mouth: Mucous membranes are moist.     Pharynx: Uvula midline.     Tonsils: No tonsillar exudate or tonsillar abscesses.     Comments: Postnasal drip. Eyes:     Extraocular Movements: Extraocular movements intact.     Conjunctiva/sclera: Conjunctivae normal.     Pupils: Pupils are equal, round, and reactive to light.  Neck:     Musculoskeletal: Normal range of motion and neck supple.  Cardiovascular:     Rate and Rhythm: Normal rate and regular rhythm.     Heart sounds: Normal heart sounds. No murmur.  Pulmonary:     Effort: Pulmonary effort is normal. No respiratory distress.     Breath sounds: Normal breath sounds.  Abdominal:     Palpations: Abdomen is soft.     Tenderness: There is no abdominal tenderness.  Musculoskeletal: Normal range  of motion.  Lymphadenopathy:     Cervical: No cervical adenopathy.  Skin:    General: Skin is warm and dry.     Comments: The skin between her eyes along her upper nose is red, and she frequently grabs and rubs this area during interview.  This area is tender to palpation and palpation here both re-creates and exacerbates her reported headache  Neurological:     Mental Status: She is alert and oriented to person, place, and time.     Cranial Nerves: No cranial nerve deficit or facial asymmetry.     Sensory: No sensory deficit.  Psychiatric:        Mood and Affect: Mood normal.        Behavior: Behavior normal.      ED Treatments / Results  Labs (all labs ordered are listed, but only abnormal results are displayed) Labs Reviewed  BASIC METABOLIC PANEL - Abnormal; Notable for the following components:      Result Value   Glucose, Bld 167 (*)    All other components within normal limits  CBC - Abnormal; Notable for the following components:   Hemoglobin 16.4 (*)    HCT 47.8 (*)    All other components within normal limits    EKG None  Radiology No results found.  Procedures Procedures (including critical care time)  Medications Ordered in ED Medications  ketorolac (TORADOL) 15 MG/ML injection 15 mg (has no administration in time range)  sodium chloride 0.9 % bolus 1,000 mL (1,000 mLs Intravenous New Bag/Given 12/05/18 0547)  diphenhydrAMINE (BENADRYL) injection 25 mg (25 mg Intravenous Given 12/05/18 0544)  prochlorperazine (COMPAZINE) injection 10 mg (10 mg Intravenous Given 12/05/18 0543)     Initial Impression / Assessment and Plan / ED Course  I have reviewed the triage vital signs and the nursing notes.  Pertinent labs & imaging results that were available during my care of the patient were reviewed by me and considered in my medical decision making (see chart for details).       Patient presents today for evaluation of a headache.  She has previously been seen  multiple times this calendar year for similar.  Her headache developed today  along with allergies for the past 2 days.  She has not consistently been taking any allergy medicine.  She requested a migraine cocktail.  She also requested that labs be obtained as she states she is diabetic and has not been checking her sugar.  Migraine cocktail that has worked for her in the past was ordered.  Basic labs were sent.  I recommended that she start consistently taking an allergy medicine.  I also suspect that some of her discomfort is related to constantly rubbing her forehead and between her eyes and I recommended that she discontinue this behavior.    At shift change care was transferred to [facility] who will follow pending studies, re-evaulate and determine disposition.     Final Clinical Impressions(s) / ED Diagnoses   Final diagnoses:  Acute nonintractable headache, unspecified headache type  Sinus headache    ED Discharge Orders    None       Ollen Gross 12/05/18 0636    Ezequiel Essex, MD 12/05/18 2104

## 2018-12-05 NOTE — ED Notes (Signed)
Pt given discharge instructions and follow up information. Pt given the opportunity to ask questions. Pt verbalized understanding. Pt's IV removed.

## 2019-02-22 ENCOUNTER — Telehealth: Payer: Self-pay | Admitting: *Deleted

## 2019-02-22 NOTE — Telephone Encounter (Addendum)
Spoke with pt. She is planning to come into office on Monday 6/8 for 2:30 visit. I advised to check-in 15-30 minutes prior. Reviewed curbside check-in process with pt including COVID19 screening questions (pt answered no to ?s at this time), mask requirement, and that temperature will be taken upon arrival. Pt will bring her own mask. Pt verbalized understanding and appreciation for the call.

## 2019-02-27 ENCOUNTER — Ambulatory Visit: Payer: Medicaid Other | Admitting: Neurology

## 2019-02-28 ENCOUNTER — Other Ambulatory Visit: Payer: Self-pay

## 2019-02-28 ENCOUNTER — Encounter: Payer: Self-pay | Admitting: Neurology

## 2019-02-28 ENCOUNTER — Ambulatory Visit (INDEPENDENT_AMBULATORY_CARE_PROVIDER_SITE_OTHER): Payer: Medicaid Other | Admitting: Neurology

## 2019-02-28 VITALS — BP 151/87 | HR 77 | Temp 97.8°F | Ht 59.0 in | Wt 231.0 lb

## 2019-02-28 DIAGNOSIS — R519 Headache, unspecified: Secondary | ICD-10-CM

## 2019-02-28 DIAGNOSIS — G43711 Chronic migraine without aura, intractable, with status migrainosus: Secondary | ICD-10-CM | POA: Diagnosis not present

## 2019-02-28 DIAGNOSIS — R51 Headache: Secondary | ICD-10-CM

## 2019-02-28 DIAGNOSIS — G4719 Other hypersomnia: Secondary | ICD-10-CM

## 2019-02-28 DIAGNOSIS — G4440 Drug-induced headache, not elsewhere classified, not intractable: Secondary | ICD-10-CM | POA: Diagnosis not present

## 2019-02-28 DIAGNOSIS — R5383 Other fatigue: Secondary | ICD-10-CM | POA: Diagnosis not present

## 2019-02-28 MED ORDER — TOPIRAMATE 50 MG PO TABS: 50.0000 mg | ORAL_TABLET | Freq: Every day | ORAL | 11 refills | Status: DC

## 2019-02-28 MED ORDER — FREMANEZUMAB-VFRM 225 MG/1.5ML ~~LOC~~ SOSY: 225.0000 mg | PREFILLED_SYRINGE | SUBCUTANEOUS | 11 refills | Status: DC

## 2019-02-28 NOTE — Progress Notes (Signed)
Nerve block Pt signed consent  0.5% Bupivocaine 9 mL LOT: [id number] EXP: [id number] NDC: 70786-754-49  2% Lidocaine 9 mL LOT: [id number] EXP: 03/21/2020 NDC: 2010-0712-19
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
## 2019-02-28 NOTE — Patient Instructions (Signed)
Start Topamax 50mg  at bedtime Start Ajovy Monthly Nerve Blocks today Follow up in 8 weeks  Fremanezumab injection What is this medicine? FREMANEZUMAB (fre ma NEZ ue mab) is used to prevent migraine headaches. This medicine may be used for other purposes; ask your health care provider or pharmacist if you have questions. COMMON BRAND NAME(S): AJOVY What should I tell my health care provider before I take this medicine? They need to know if you have any of these conditions: -an unusual or allergic reaction to fremanezumab, other medicines, foods, dyes, or preservatives -pregnant or trying to get pregnant -breast-feeding How should I use this medicine? This medicine is for injection under the skin. You will be taught how to prepare and give this medicine. Use exactly as directed. Take your medicine at regular intervals. Do not take your medicine more often than directed. It is important that you put your used needles and syringes in a special sharps container. Do not put them in a trash can. If you do not have a sharps container, call your pharmacist or healthcare provider to get one. Talk to your pediatrician regarding the use of this medicine in children. Special care may be needed. Overdosage: If you think you have taken too much of this medicine contact a poison control center or emergency room at once. NOTE: This medicine is only for you. Do not share this medicine with others. What if I miss a dose? If you miss a dose, take it as soon as you can. If it is almost time for your next dose, take only that dose. Do not take double or extra doses. What may interact with this medicine? Interactions are not expected. This list may not describe all possible interactions. Give your health care provider a list of all the medicines, herbs, non-prescription drugs, or dietary supplements you use. Also tell them if you smoke, drink alcohol, or use illegal drugs. Some items may interact with your  medicine. What should I watch for while using this medicine? Tell your doctor or healthcare professional if your symptoms do not start to get better or if they get worse. What side effects may I notice from receiving this medicine? Side effects that you should report to your doctor or health care professional as soon as possible: -allergic reactions like skin rash, itching or hives, swelling of the face, lips, or tongue Side effects that usually do not require medical attention (report these to your doctor or health care professional if they continue or are bothersome): -pain, redness, or irritation at site where injected This list may not describe all possible side effects. Call your doctor for medical advice about side effects. You may report side effects to FDA at 1-800-FDA-1088. Where should I keep my medicine? Keep out of the reach of children. You will be instructed on how to store this medicine. Throw away any unused medicine after the expiration date on the label. NOTE: This sheet is a summary. It may not cover all possible information. If you have questions about this medicine, talk to your doctor, pharmacist, or health care provider.  2019 Elsevier/Gold Standard (2017-06-07 17:22:56)   Topiramate tablets What is this medicine? TOPIRAMATE (toe PYRE a mate) is used to treat seizures in adults or children with epilepsy. It is also used for the prevention of migraine headaches. This medicine may be used for other purposes; ask your health care provider or pharmacist if you have questions. COMMON BRAND NAME(S): Topamax, Topiragen What should I tell my health care  provider before I take this medicine? They need to know if you have any of these conditions: -bleeding disorders -cirrhosis of the liver or liver disease -diarrhea -glaucoma -kidney stones or kidney disease -low blood counts, like low white cell, platelet, or red cell counts -lung disease like asthma, obstructive pulmonary  disease, emphysema -metabolic acidosis -on a ketogenic diet -schedule for surgery or a procedure -suicidal thoughts, plans, or attempt; a previous suicide attempt by you or a family member -an unusual or allergic reaction to topiramate, other medicines, foods, dyes, or preservatives -pregnant or trying to get pregnant -breast-feeding How should I use this medicine? Take this medicine by mouth with a glass of water. Follow the directions on the prescription label. Do not crush or chew. You may take this medicine with meals. Take your medicine at regular intervals. Do not take it more often than directed. Talk to your pediatrician regarding the use of this medicine in children. Special care may be needed. While this drug may be prescribed for children as young as 53 years of age for selected conditions, precautions do apply. Overdosage: If you think you have taken too much of this medicine contact a poison control center or emergency room at once. NOTE: This medicine is only for you. Do not share this medicine with others. What if I miss a dose? If you miss a dose, take it as soon as you can. If your next dose is to be taken in less than 6 hours, then do not take the missed dose. Take the next dose at your regular time. Do not take double or extra doses. What may interact with this medicine? Do not take this medicine with any of the following medications: -probenecid This medicine may also interact with the following medications: -acetazolamide -alcohol -amitriptyline -aspirin and aspirin-like medicines -birth control pills -certain medicines for depression -certain medicines for seizures -certain medicines that treat or prevent blood clots like warfarin, enoxaparin, dalteparin, apixaban, dabigatran, and rivaroxaban -digoxin -hydrochlorothiazide -lithium -medicines for pain, sleep, or muscle relaxation -metformin -methazolamide -NSAIDS, medicines for pain and inflammation, like  ibuprofen or naproxen -pioglitazone -risperidone This list may not describe all possible interactions. Give your health care provider a list of all the medicines, herbs, non-prescription drugs, or dietary supplements you use. Also tell them if you smoke, drink alcohol, or use illegal drugs. Some items may interact with your medicine. What should I watch for while using this medicine? Visit your doctor or health care professional for regular checks on your progress. Do not stop taking this medicine suddenly. This increases the risk of seizures if you are using this medicine to control epilepsy. Wear a medical identification bracelet or chain to say you have epilepsy or seizures, and carry a card that lists all your medicines. This medicine can decrease sweating and increase your body temperature. Watch for signs of deceased sweating or fever, especially in children. Avoid extreme heat, hot baths, and saunas. Be careful about exercising, especially in hot weather. Contact your health care provider right away if you notice a fever or decrease in sweating. You should drink plenty of fluids while taking this medicine. If you have had kidney stones in the past, this will help to reduce your chances of forming kidney stones. If you have stomach pain, with nausea or vomiting and yellowing of your eyes or skin, call your doctor immediately. You may get drowsy, dizzy, or have blurred vision. Do not drive, use machinery, or do anything that needs mental alertness until  you know how this medicine affects you. To reduce dizziness, do not sit or stand up quickly, especially if you are an older patient. Alcohol can increase drowsiness and dizziness. Avoid alcoholic drinks. If you notice blurred vision, eye pain, or other eye problems, seek medical attention at once for an eye exam. The use of this medicine may increase the chance of suicidal thoughts or actions. Pay special attention to how you are responding while on  this medicine. Any worsening of mood, or thoughts of suicide or dying should be reported to your health care professional right away. This medicine may increase the chance of developing metabolic acidosis. If left untreated, this can cause kidney stones, bone disease, or slowed growth in children. Symptoms include breathing fast, fatigue, loss of appetite, irregular heartbeat, or loss of consciousness. Call your doctor immediately if you experience any of these side effects. Also, tell your doctor about any surgery you plan on having while taking this medicine since this may increase your risk for metabolic acidosis. Birth control pills may not work properly while you are taking this medicine. Talk to your doctor about using an extra method of birth control. Women who become pregnant while using this medicine may enroll in the [place] Pregnancy Registry by calling [phone]. This registry collects information about the safety of antiepileptic drug use during pregnancy. What side effects may I notice from receiving this medicine? Side effects that you should report to your doctor or health care professional as soon as possible: -allergic reactions like skin rash, itching or hives, swelling of the face, lips, or tongue -decreased sweating and/or rise in body temperature -depression -difficulty breathing, fast or irregular breathing patterns -difficulty speaking -difficulty walking or controlling muscle movements -hearing impairment -redness, blistering, peeling or loosening of the skin, including inside the mouth -tingling, pain or numbness in the hands or feet -unusual bleeding or bruising -unusually weak or tired -worsening of mood, thoughts or actions of suicide or dying Side effects that usually do not require medical attention (report to your doctor or health care professional if they continue or are bothersome): -altered taste -back pain, joint or muscle aches  and pains -diarrhea, or constipation -headache -loss of appetite -nausea -stomach upset, indigestion -tremors This list may not describe all possible side effects. Call your doctor for medical advice about side effects. You may report side effects to FDA at 1-800-FDA-1088. Where should I keep my medicine? Keep out of the reach of children. Store at room temperature between 15 and 30 degrees C (59 and 86 degrees F) in a tightly closed container. Protect from moisture. Throw away any unused medicine after the expiration date. NOTE: This sheet is a summary. It may not cover all possible information. If you have questions about this medicine, talk to your doctor, pharmacist, or health care provider.  2019 Elsevier/Gold Standard (2013-09-11 23:17:57)

## 2019-03-01 ENCOUNTER — Ambulatory Visit: Payer: Medicaid Other | Admitting: Neurology

## 2019-03-01 DIAGNOSIS — G43711 Chronic migraine without aura, intractable, with status migrainosus: Secondary | ICD-10-CM | POA: Insufficient documentation

## 2019-03-01 DIAGNOSIS — G4440 Drug-induced headache, not elsewhere classified, not intractable: Secondary | ICD-10-CM | POA: Insufficient documentation

## 2019-03-02 ENCOUNTER — Telehealth: Payer: Self-pay | Admitting: *Deleted

## 2019-03-02 NOTE — Telephone Encounter (Signed)
Ajovy PA completed on [facility] website. Included previous medication tried/failed and contraindicated meds. I also spoke with pt on phone. She was unaware of how long she had tried the Diovan. Lexapro was only 1 month. I also advised pt that the PA request sheet we received from [facility] has the wrong # for pt on there and I advised she call them to update. Pt aware Dr. Jaynee Eagles will be checking her mychart message sent today. I advised pt to call PCP for the nosebleed and congestion. Pt verbalized appreciation and understanding for the call.   Awaiting determination from [facility].

## 2019-03-03 ENCOUNTER — Other Ambulatory Visit: Payer: Self-pay

## 2019-03-03 ENCOUNTER — Emergency Department (HOSPITAL_COMMUNITY)
Admission: EM | Admit: 2019-03-03 | Discharge: 2019-03-03 | Disposition: A | Discharge status: Home / Self Care | Payer: Medicaid Other | Attending: Emergency Medicine | Admitting: Emergency Medicine

## 2019-03-03 DIAGNOSIS — E119 Type 2 diabetes mellitus without complications: Secondary | ICD-10-CM | POA: Diagnosis not present

## 2019-03-03 DIAGNOSIS — Z880 Allergy status to penicillin: Secondary | ICD-10-CM | POA: Insufficient documentation

## 2019-03-03 DIAGNOSIS — Z79899 Other long term (current) drug therapy: Secondary | ICD-10-CM | POA: Diagnosis not present

## 2019-03-03 DIAGNOSIS — R519 Headache, unspecified: Secondary | ICD-10-CM

## 2019-03-03 DIAGNOSIS — J111 Influenza due to unidentified influenza virus with other respiratory manifestations: Secondary | ICD-10-CM | POA: Insufficient documentation

## 2019-03-03 DIAGNOSIS — Z20828 Contact with and (suspected) exposure to other viral communicable diseases: Secondary | ICD-10-CM | POA: Diagnosis not present

## 2019-03-03 DIAGNOSIS — R51 Headache: Secondary | ICD-10-CM | POA: Insufficient documentation

## 2019-03-03 DIAGNOSIS — J449 Chronic obstructive pulmonary disease, unspecified: Secondary | ICD-10-CM | POA: Insufficient documentation

## 2019-03-03 DIAGNOSIS — J45909 Unspecified asthma, uncomplicated: Secondary | ICD-10-CM | POA: Insufficient documentation

## 2019-03-03 DIAGNOSIS — I10 Essential (primary) hypertension: Secondary | ICD-10-CM | POA: Diagnosis not present

## 2019-03-03 MED ORDER — DIPHENHYDRAMINE HCL 50 MG/ML IJ SOLN
25.0000 mg | Freq: Once | INTRAMUSCULAR | Status: AC
  Administered 2019-03-03: 25 mg via INTRAVENOUS
  Filled 2019-03-03: qty 1

## 2019-03-03 MED ORDER — SODIUM CHLORIDE 0.9 % IV BOLUS
1000.0000 mL | Freq: Once | INTRAVENOUS | Status: AC
  Administered 2019-03-03: 1000 mL via INTRAVENOUS

## 2019-03-03 MED ORDER — PROCHLORPERAZINE EDISYLATE 10 MG/2ML IJ SOLN
10.0000 mg | Freq: Once | INTRAMUSCULAR | Status: AC
  Administered 2019-03-03: 10 mg via INTRAVENOUS
  Filled 2019-03-03: qty 2

## 2019-03-03 MED ORDER — KETOROLAC TROMETHAMINE 30 MG/ML IJ SOLN
30.0000 mg | Freq: Once | INTRAMUSCULAR | Status: AC
  Administered 2019-03-03: 30 mg via INTRAVENOUS
  Filled 2019-03-03: qty 1

## 2019-03-03 NOTE — Discharge Instructions (Signed)
Please check MyChart for your COVID-19 report. It should be available in 2-3 days. If it is positive, you will have to self-quarantine for two weeks.

## 2019-03-03 NOTE — ED Provider Notes (Signed)
[facility] EMERGENCY DEPARTMENT Provider Note   CSN: [id number] Arrival date & time: 03/03/19  0121     History   Chief Complaint Chief Complaint  Patient presents with  . Headache  . Fatigue    HPI Susan Hogan is a 58 y.o. female.   The history is provided by the patient.  She has history of diabetes, hypertension, COPD, chronic headaches and comes in with worsening headache.  She states that she had seen her neurologist and received an injection yesterday, but her headaches are not improving.  Headaches move throughout her head and can be most anywhere.  She rates pain at 9/10.  She has been taking Excedrin without relief.  She denies visual change, nausea, vomiting.  Also, she had just completed 2 weeks quarantine because her daughter was exposed to COVID-19, but did test negative.  She had onset today of altered sense of smell and taste, sore throat, cough, body aches.  She denies fever, chills, sweats.  She denies nausea, vomiting, diarrhea.  Past Medical History:  Diagnosis Date  . Anemia   . Arthritis    RA "states does not see Rheumatologist"  . Asthma   . Carpal tunnel syndrome    bilaterally  . Complication of anesthesia    difficulty breathing  . COPD (chronic obstructive pulmonary disease) ([place])   . Degenerative disk disease    back  . Depression   . Diabetes mellitus   . Fibromyalgia   . GERD (gastroesophageal reflux disease)   . Headache(784.0)    migraines  . Heart murmur   . Hepatitis    In followup treatment for hepatitis C  . Hypertension    no medication for at this time, sees Dr. Dinah Beers Pcp  . Hypotensive episode    hx of  . Irritable bowel syndrome   . Kidney stone    with hematuria  . Memory loss   . Recurrent upper respiratory infection (URI)   . Shortness of breath   . Syncope     Patient Active Problem List   Diagnosis Date Noted  . Chronic migraine without aura, with intractable migraine, so stated,  with status migrainosus 03/01/2019  . Medication overuse headache 03/01/2019  . Seasonal allergic rhinitis due to pollen 03/25/2018  . Adnexal cyst 03/25/2018  . Morbid obesity ([place]) 03/25/2018  . Essential hypertension 03/25/2018  . Pure hypercholesterolemia 03/25/2018  . Sleep-disordered breathing 03/25/2018  . Vitamin D deficiency 03/25/2018  . Bilateral lower extremity edema 03/25/2018  . Acute meniscal tear of left knee 11/24/2016  . Chronic hepatitis C without hepatic coma ([place]) 11/24/2016  . Fatty liver 11/24/2016  . H/O degenerative disc disease 11/24/2016  . COPD (chronic obstructive pulmonary disease) ([place]) 10/22/2016  . Type 2 diabetes mellitus, cannot tolerate Metformin 07/25/2014  . Fibromyalgia 07/25/2014  . MDD (major depressive disorder), recurrent severe, without psychosis ([place]) 03/17/2013  . Hepatitis C 08/02/2007  . IBS (irritable bowel syndrome) 08/02/2007  . Mild persistent asthma with acute exacerbation 09/21/1962    Past Surgical History:  Procedure Laterality Date  . CHOLECYSTECTOMY  2019  . DILATION AND CURETTAGE OF UTERUS    . Left knee surgery    . LIVER BIOPSY    . TONSILLECTOMY    . TUBAL LIGATION       OB History   No obstetric history on file.      Home Medications    Prior to Admission medications   Medication Sig Start Date End  Date Taking? Authorizing Provider  albuterol (PROVENTIL HFA;VENTOLIN HFA) 108 (90 Base) MCG/ACT inhaler Inhale 1-2 puffs into the lungs every 6 (six) hours as needed for wheezing or shortness of breath. 03/03/18   Couture, Cortni S, PA-C  aspirin-acetaminophen-caffeine (EXCEDRIN MIGRAINE) [phone] MG tablet Take 2-3 tablets by mouth 2 (two) times daily as needed for headache.     [provider]  blood glucose meter kit and supplies KIT Dispense based on patient and insurance preference. Use up to four times daily as directed. (FOR ICD-9 250.00, 250.01). 03/25/18   Briscoe Deutscher, DO  cetirizine (ZYRTEC) 10  MG tablet Take 10 mg by mouth at bedtime.    [provider]  doxycycline (VIBRAMYCIN) 100 MG capsule Take 1 capsule (100 mg total) by mouth 2 (two) times daily. Patient taking differently: Take 100 mg by mouth daily.  10/14/18   Montine Circle, PA-C  escitalopram (LEXAPRO) 10 MG tablet TAKE 1 TABLET(10 MG) BY MOUTH DAILY Patient not taking: Reported on 07/05/2018 06/01/18   Briscoe Deutscher, DO  fluticasone (FLONASE) 50 MCG/ACT nasal spray Place 1 spray into both nostrils daily.    [provider]  Fremanezumab-vfrm (AJOVY) 225 MG/1.5ML SOSY Inject 225 mg into the skin every 30 (thirty) days. 02/28/19   Melvenia Beam, MD  glucose blood [facility] ACTIVE STRIPS) test strip Use as instructed 04/15/16   Nona Dell, PA-C  montelukast (SINGULAIR) 10 MG tablet Take 10 mg by mouth at bedtime.    [provider]  ondansetron (ZOFRAN ODT) 4 MG disintegrating tablet '4mg'$  ODT q4 hours prn nausea/vomit Patient not taking: Reported on 10/14/2018 10/04/18   Jacqlyn Larsen, PA-C  topiramate (TOPAMAX) 50 MG tablet Take 1 tablet (50 mg total) by mouth at bedtime. 02/28/19   Melvenia Beam, MD    Family History Family History  Problem Relation Age of Onset  . Heart disease Mother   . Diabetes Mother   . Asthma Mother   . Anemia Mother   . Cerebral aneurysm Mother        ruptured  . Kidney failure Mother   . Migraines Mother   . Parkinson's disease Mother   . Colon cancer Maternal Uncle   . Heart disease Maternal Uncle   . Heart disease Maternal Grandmother   . Diabetes Maternal Grandmother   . Asthma Maternal Grandmother   . Heart Problems Maternal Grandmother   . Heart Problems Other        through family  . Diabetes Granddaughter   . Diabetes Daughter     Social History Social History   Tobacco Use  . Smoking status: Never Smoker  . Smokeless tobacco: Never Used  Substance Use Topics  . Alcohol use: Never    Alcohol/week: 0.0 standard drinks     Frequency: Never  . Drug use: Never     Allergies   Anesthetic ether [ether], Nubain [nalbuphine hcl], Penicillins, Lisinopril, Advair diskus [fluticasone-salmeterol], and Lactose intolerance (gi)   Review of Systems Review of Systems  All other systems reviewed and are negative.    Physical Exam Updated Vital Signs BP (!) 146/85 (BP Location: Right Arm)   Pulse 91   Temp 99.2 F (37.3 C) (Oral)   Resp 18   LMP  (LMP Unknown)   SpO2 98%   Physical Exam Vitals signs and nursing note reviewed.    58 year old female, resting comfortably and in no acute distress. Vital signs are significant for elevated blood pressure. Oxygen saturation is  98%, which is normal. Head is normocephalic and atraumatic. PERRLA, EOMI. Oropharynx is clear.  There is tenderness palpation of the temporalis muscles bilaterally.  There is no tenderness palpation at the insertion of the paracervical muscles. Neck is nontender and supple without adenopathy or JVD. Back is nontender and there is no CVA tenderness. Lungs are clear without rales, wheezes, or rhonchi. Chest is nontender. Heart has regular rate and rhythm without murmur. Abdomen is soft, flat, nontender without masses or hepatosplenomegaly and peristalsis is normoactive. Extremities have no cyanosis or edema, full range of motion is present. Skin is warm and dry without rash. Neurologic: Mental status is normal, cranial nerves are intact, there are no motor or sensory deficits.  ED Treatments / Results  Labs (all labs ordered are listed, but only abnormal results are displayed) Labs Reviewed  NOVEL CORONAVIRUS, NAA (HOSPITAL ORDER, SEND-OUT TO REF LAB)  CBG MONITORING, ED    EKG EKG Interpretation  Date/Time:  Friday March 03 2019 01:31:53 EDT Ventricular Rate:  91 PR Interval:    QRS Duration: 93 QT Interval:  365 QTC Calculation: 450 R Axis:   72 Text Interpretation:  Sinus rhythm Borderline T abnormalities, anterior leads When  compared with ECG of 10/27/2018, No significant change was found Confirmed by Delora Fuel ([id number]) on 03/03/2019 1:39:59 AM   Radiology No results found.  Procedures Procedures   Medications Ordered in ED Medications  sodium chloride 0.9 % bolus 1,000 mL (has no administration in time range)  ketorolac (TORADOL) 30 MG/ML injection 30 mg (has no administration in time range)  prochlorperazine (COMPAZINE) injection 10 mg (has no administration in time range)  diphenhydrAMINE (BENADRYL) injection 25 mg (has no administration in time range)     Initial Impression / Assessment and Plan / ED Course  I have reviewed the triage vital signs and the nursing notes.  Pertinent labresults that were available during my care of the patient were reviewed by me and considered in my medical decision making (see chart for details).  Exacerbation of chronic headache.  Old records are reviewed, and she has several ED visits for headaches and usually gets good relief with a headache cocktail of ketorolac, antiemetic, normal saline.  She will be given normal saline, prochlorperazine, ketorolac.  Steroids were avoided because of diabetes.  Also, symptoms suggestive of COVID-19.  Will obtain COVID-19 swab.  She had excellent relief of headache with above-noted treatment.  She is discharged with instructions to treat her body aches symptomatically.  If COVID-19 test is positive, she will need to self quarantine for 2 weeks.  Susan Hogan was evaluated in Emergency Department on 03/03/2019 for the symptoms described in the history of present illness. She was evaluated in the context of the global COVID-19 pandemic, which necessitated consideration that the patient might be at risk for infection with the SARS-CoV-2 virus that causes COVID-19. Institutional protocols and algorithms that pertain to the evaluation of patients at risk for COVID-19 are in a state of rapid change based on information released by regulatory  bodies including the CDC and federal and state organizations. These policies and algorithms were followed during the patient's care in the ED.  Final Clinical Impressions(s) / ED Diagnoses   Final diagnoses:  Bad headache  Influenza-like illness    ED Discharge Orders    None       Delora Fuel, MD 12/09/20 [phone]

## 2019-03-03 NOTE — ED Triage Notes (Signed)
Per pt has been having headache fatigue, chills, fevers for 1 week. Opt's daughter was exposed to Covid at work and tested negative. Senses of taste is gone.

## 2019-03-04 LAB — NOVEL CORONAVIRUS, NAA (HOSP ORDER, SEND-OUT TO REF LAB; TAT 18-24 HRS): SARS-CoV-2, NAA: NOT DETECTED

## 2019-03-07 ENCOUNTER — Telehealth: Payer: Self-pay | Admitting: *Deleted

## 2019-03-07 NOTE — Telephone Encounter (Signed)
Spoke with pt and advised a 1:00 appt on Thurs 6/18 has opened. Pt accepted. Pt understands to bring her own mask and understands covid19 screening questions will be asked. She was advised to call to r/s if she gets exposed the virus before Thursday or develops any fever, cough, sob. She verbalized appreciation.

## 2019-03-07 NOTE — Telephone Encounter (Signed)
Pt called asking to schedule nerve blocks on Thursday. Pt aware at this time we do not have any openings however pt encouraged to call back tomorrow to check. She verbalized appreciation and will call back.

## 2019-03-08 NOTE — Telephone Encounter (Signed)
Ajovy denied by Medicaid d/t patient having [place] and also needs to be utilizing prophylactic intervention modalities (ex. Behavioral therapy, lifestyle modifications, physical therapy).   If appeal chosen, must complete hearing request form and mail or fax to the [employer], office of [employer].

## 2019-03-08 NOTE — Telephone Encounter (Signed)
She is coming in tomorrow, I will discuss with her and possibly give her some samples thanks

## 2019-03-09 ENCOUNTER — Other Ambulatory Visit: Payer: Self-pay

## 2019-03-09 ENCOUNTER — Ambulatory Visit (INDEPENDENT_AMBULATORY_CARE_PROVIDER_SITE_OTHER): Payer: Medicaid Other | Admitting: Neurology

## 2019-03-09 VITALS — Temp 97.3°F

## 2019-03-09 DIAGNOSIS — G43711 Chronic migraine without aura, intractable, with status migrainosus: Secondary | ICD-10-CM

## 2019-03-09 DIAGNOSIS — E669 Obesity, unspecified: Secondary | ICD-10-CM | POA: Diagnosis not present

## 2019-03-09 DIAGNOSIS — G5602 Carpal tunnel syndrome, left upper limb: Secondary | ICD-10-CM | POA: Diagnosis not present

## 2019-03-09 MED ORDER — PROCHLORPERAZINE MALEATE 10 MG PO TABS: 10.0000 mg | ORAL_TABLET | Freq: Four times a day (QID) | ORAL | 6 refills | Status: DC | PRN

## 2019-03-09 MED ORDER — TOPIRAMATE 100 MG PO TABS: 100.0000 mg | ORAL_TABLET | Freq: Every day | ORAL | 3 refills | Status: DC

## 2019-03-09 MED ORDER — RIZATRIPTAN BENZOATE 10 MG PO TBDP: 10.0000 mg | ORAL_TABLET | ORAL | 11 refills | Status: DC | PRN

## 2019-03-09 MED ORDER — AJOVY 225 MG/1.5ML ~~LOC~~ SOSY: 675.0000 mg | PREFILLED_SYRINGE | SUBCUTANEOUS | 0 refills | Status: DC

## 2019-03-09 NOTE — Patient Instructions (Signed)
When you have a migraine: Maxalt(Rizatriptan): Please take one tablet at the onset of your headache. If it does not improve the symptoms please take one additional tablet. Do not take more then 2 tablets in 24hrs. Do not take use more then 2 to 3 times in a week.  Also for Migraine: May take Compazine (Prochlorperazine) with benadryl up to 4x a day  Prochlorperazine tablets What is this medicine? PROCHLORPERAZINE (proe klor PER a zeen) helps to control severe nausea and vomiting. This medicine is also used to treat schizophrenia. It can also help patients who experience anxiety that is not due to psychological illness. This medicine may be used for other purposes; ask your health care provider or pharmacist if you have questions. COMMON BRAND NAME(S): Compazine What should I tell my health care provider before I take this medicine? They need to know if you have any of these conditions: -blood disorders or disease -dementia -liver disease or jaundice -Parkinson's disease -uncontrollable movement disorder -an unusual or allergic reaction to prochlorperazine, other medicines, foods, dyes, or preservatives -pregnant or trying to get pregnant -breast-feeding How should I use this medicine? Take this medicine by mouth with a glass of water. Follow the directions on the prescription label. Take your doses at regular intervals. Do not take your medicine more often than directed. Do not stop taking this medicine suddenly. This can cause nausea, vomiting, and dizziness. Ask your doctor or health care professional for advice. Talk to your pediatrician regarding the use of this medicine in children. Special care may be needed. While this drug may be prescribed for children as young as 2 years for selected conditions, precautions do apply. Overdosage: If you think you have taken too much of this medicine contact a poison control center or emergency room at once. NOTE: This medicine is only for you. Do not  share this medicine with others. What if I miss a dose? If you miss a dose, take it as soon as you can. If it is almost time for your next dose, take only that dose. Do not take double or extra doses. What may interact with this medicine? Do not take this medicine with any of the following medications: -amoxapine -antidepressants like citalopram, escitalopram, fluoxetine, paroxetine, and sertraline -deferoxamine -dofetilide -maprotiline -tricyclic antidepressants like amitriptyline, clomipramine, imipramine, nortiptyline and others This medicine may also interact with the following medications: -lithium -medicines for pain -phenytoin -propranolol -warfarin This list may not describe all possible interactions. Give your health care provider a list of all the medicines, herbs, non-prescription drugs, or dietary supplements you use. Also tell them if you smoke, drink alcohol, or use illegal drugs. Some items may interact with your medicine. What should I watch for while using this medicine? Visit your doctor or health care professional for regular checks on your progress. You may get drowsy or dizzy. Do not drive, use machinery, or do anything that needs mental alertness until you know how this medicine affects you. Do not stand or sit up quickly, especially if you are an older patient. This reduces the risk of dizzy or fainting spells. Alcohol may interfere with the effect of this medicine. Avoid alcoholic drinks. This medicine can reduce the response of your body to heat or cold. Dress warm in cold weather and stay hydrated in hot weather. If possible, avoid extreme temperatures like saunas, hot tubs, very hot or cold showers, or activities that can cause dehydration such as vigorous exercise. This medicine can make you more sensitive to  the sun. Keep out of the sun. If you cannot avoid being in the sun, wear protective clothing and use sunscreen. Do not use sun lamps or tanning  beds/booths. Your mouth may get dry. Chewing sugarless gum or sucking hard candy, and drinking plenty of water may help. Contact your doctor if the problem does not go away or is severe. What side effects may I notice from receiving this medicine? Side effects that you should report to your doctor or health care professional as soon as possible: -blurred vision -breast enlargement in men or women -breast milk in women who are not breast-feeding -chest pain, fast or irregular heartbeat -confusion, restlessness -dark yellow or brown urine -difficulty breathing or swallowing -dizziness or fainting spells -drooling, shaking, movement difficulty (shuffling walk) or rigidity -fever, chills, sore throat -involuntary or uncontrollable movements of the eyes, mouth, head, arms, and legs -seizures -stomach area pain -unusually weak or tired -unusual bleeding or bruising -yellowing of skin or eyes Side effects that usually do not require medical attention (report to your doctor or health care professional if they continue or are bothersome): -difficulty passing urine -difficulty sleeping -headache -sexual dysfunction -skin rash, or itching This list may not describe all possible side effects. Call your doctor for medical advice about side effects. You may report side effects to FDA at 1-800-FDA-1088. Where should I keep my medicine? Keep out of the reach of children. Store at room temperature between 15 and 30 degrees C (59 and 86 degrees F). Protect from light. Throw away any unused medicine after the expiration date. NOTE: This sheet is a summary. It may not cover all possible information. If you have questions about this medicine, talk to your doctor, pharmacist, or health care provider.  2019 [employer] (2012-01-26 16:59:39)   Rizatriptan disintegrating tablets What is this medicine? RIZATRIPTAN (rye za TRIP tan) is used to treat migraines with or without aura. An aura is a  strange feeling or visual disturbance that warns you of an attack. It is not used to prevent migraines. This medicine may be used for other purposes; ask your health care provider or pharmacist if you have questions. COMMON BRAND NAME(S): Maxalt-MLT What should I tell my health care provider before I take this medicine? They need to know if you have any of these conditions: -cigarette smoker -circulation problems in fingers and toes -diabetes -heart disease -high blood pressure -high cholesterol -history of irregular heartbeat -history of stroke -kidney disease -liver disease -stomach or intestine problems -an unusual or allergic reaction to rizatriptan, other medicines, foods, dyes, or preservatives -pregnant or trying to get pregnant -breast-feeding How should I use this medicine? Take this medicine by mouth. Follow the directions on the prescription label. Leave the tablet in the sealed blister pack until you are ready to take it. With dry hands, open the blister and gently remove the tablet. If the tablet breaks or crumbles, throw it away and take a new tablet out of the blister pack. Place the tablet in the mouth and allow it to dissolve, and then swallow. Do not cut, crush, or chew this medicine. You do not need water to take this medicine. Do not take it more often than directed. Talk to your pediatrician regarding the use of this medicine in children. While this drug may be prescribed for children as young as 6 years for selected conditions, precautions do apply. Overdosage: If you think you have taken too much of this medicine contact a poison control center or emergency  room at once. NOTE: This medicine is only for you. Do not share this medicine with others. What if I miss a dose? This does not apply. This medicine is not for regular use. What may interact with this medicine? Do not take this medicine with any of the following medicines: -certain medicines for migraine headache  like almotriptan, eletriptan, frovatriptan, naratriptan, rizatriptan, sumatriptan, zolmitriptan -ergot alkaloids like dihydroergotamine, ergonovine, ergotamine, methylergonovine -MAOIs like Carbex, Eldepryl, Marplan, Nardil, and Parnate This medicine may also interact with the following medications: -certain medicines for depression, anxiety, or psychotic disorders -propranolol This list may not describe all possible interactions. Give your health care provider a list of all the medicines, herbs, non-prescription drugs, or dietary supplements you use. Also tell them if you smoke, drink alcohol, or use illegal drugs. Some items may interact with your medicine. What should I watch for while using this medicine? Visit your healthcare professional for regular checks on your progress. Tell your healthcare professional if your symptoms do not start to get better or if they get worse. You may get drowsy or dizzy. Do not drive, use machinery, or do anything that needs mental alertness until you know how this medicine affects you. Do not stand up or sit up quickly, especially if you are an older patient. This reduces the risk of dizzy or fainting spells. Alcohol may interfere with the effect of this medicine. Your mouth may get dry. Chewing sugarless gum or sucking hard candy and drinking plenty of water may help. Contact your healthcare professional if the problem does not go away or is severe. If you take migraine medicines for 10 or more days a month, your migraines may get worse. Keep a diary of headache days and medicine use. Contact your healthcare professional if your migraine attacks occur more frequently. What side effects may I notice from receiving this medicine? Side effects that you should report to your doctor or health care professional as soon as possible: -allergic reactions like skin rash, itching or hives, swelling of the face, lips, or tongue -chest pain or chest tightness -signs and  symptoms of a dangerous change in heartbeat or heart rhythm like chest pain; dizziness; fast, irregular heartbeat; palpitations; feeling faint or lightheaded; falls; breathing problems -signs and symptoms of a stroke like changes in vision; confusion; trouble speaking or understanding; severe headaches; sudden numbness or weakness of the face, arm or leg; trouble walking; dizziness; loss of balance or coordination -signs and symptoms of serotonin syndrome like irritable; confusion; diarrhea; fast or irregular heartbeat; muscle twitching; stiff muscles; trouble walking; sweating; high fever; seizures; chills; vomiting Side effects that usually do not require medical attention (report to your doctor or health care professional if they continue or are bothersome): -diarrhea -dizziness -drowsiness -dry mouth -headache -nausea, vomiting -pain, tingling, numbness in the hands or feet -stomach pain This list may not describe all possible side effects. Call your doctor for medical advice about side effects. You may report side effects to FDA at 1-800-FDA-1088. Where should I keep my medicine? Keep out of the reach of children. Store at room temperature between 15 and 30 degrees C (59 and 86 degrees F). Protect from light and moisture. Throw away any unused medicine after the expiration date. NOTE: This sheet is a summary. It may not cover all possible information. If you have questions about this medicine, talk to your doctor, pharmacist, or health care provider.  2019 [employer] Standard (2018-03-22 14:58:08)
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
## 2019-03-09 NOTE — Progress Notes (Signed)
Nerve block w/o steroid: Pt signed consent  0.5% Bupivocaine 9 mL LOT: [id number] EXP: 09/2019 NDC: 56314-970-26  2% Lidocaine 9 mL LOT: [id number] EXP: 03/21/2020 NDC: 0277-4128-78

## 2019-03-15 ENCOUNTER — Telehealth: Payer: Self-pay | Admitting: Neurology

## 2019-03-15 NOTE — Telephone Encounter (Signed)
I called the pt to get her scheduled for her sleep consult. The pt stated that she needs a refill on Rizatriptan. Pt stated that this medication is working for her but the Prochlorperazine is not really working at this time. The pt asked about Prochlorperazine rx stating that she may add Benadryl with the medication. The pt stated no Benadryl was prescribed with the medication. I informed the pt that Benadryl is OTC therefore, she can purchase Benadryl and take the prescribed amount with the Prochlorperazine. The pt asked if a prescription can be wrote for the Benadryl. I informed her that Benadryl is OTC. Pt verbalized understanding but stated the pharmacy she uses will give it to her on a credit if it is ordered from the doctor. I informed her I would make you aware of that. Pt verbalized understanding and also stated her new pharmacy is [facility] at [place].

## 2019-03-16 ENCOUNTER — Other Ambulatory Visit: Payer: Self-pay | Admitting: *Deleted

## 2019-03-16 MED ORDER — RIZATRIPTAN BENZOATE 10 MG PO TBDP: 10.0000 mg | ORAL_TABLET | ORAL | 10 refills | Status: DC | PRN

## 2019-03-16 NOTE — Telephone Encounter (Signed)
I have attempted to call [facility] multiple times to cancel the Rizatriptan but the connection is poor and the phone call air dropped.

## 2019-03-16 NOTE — Telephone Encounter (Signed)
I changed pt's pharmacy to [facility]. I will check with Dr. Jaynee Eagles about a Benadryl order. Rizatriptan sent to [facility].

## 2019-03-16 NOTE — Telephone Encounter (Signed)
Tried to call again but received message that call could not be completed.

## 2019-03-20 ENCOUNTER — Other Ambulatory Visit: Payer: Self-pay | Admitting: Neurology

## 2019-03-20 ENCOUNTER — Encounter: Payer: Self-pay | Admitting: *Deleted

## 2019-03-20 MED ORDER — DIPHENHYDRAMINE HCL 25 MG PO CAPS: 25.0000 mg | ORAL_CAPSULE | Freq: Four times a day (QID) | ORAL | 2 refills | Status: DC | PRN

## 2019-03-20 NOTE — Telephone Encounter (Signed)
Sent pt mychart message

## 2019-03-20 NOTE — Telephone Encounter (Signed)
Done. thanks

## 2019-03-27 ENCOUNTER — Institutional Professional Consult (permissible substitution): Payer: Medicaid Other | Admitting: Neurology

## 2019-03-27 ENCOUNTER — Telehealth: Payer: Self-pay | Admitting: Neurology

## 2019-03-27 NOTE — Telephone Encounter (Signed)
Pt was no show to sleep consult today 

## 2019-03-28 ENCOUNTER — Encounter: Payer: Self-pay | Admitting: Neurology

## 2019-04-13 ENCOUNTER — Encounter: Payer: Self-pay | Admitting: Neurology

## 2019-04-14 ENCOUNTER — Other Ambulatory Visit: Payer: Self-pay | Admitting: Physician Assistant

## 2019-04-14 DIAGNOSIS — N644 Mastodynia: Secondary | ICD-10-CM

## 2019-04-20 ENCOUNTER — Institutional Professional Consult (permissible substitution): Payer: Medicaid Other | Admitting: Neurology

## 2019-04-20 ENCOUNTER — Encounter: Payer: Self-pay | Admitting: Neurology

## 2019-04-20 NOTE — Telephone Encounter (Signed)
Please dismiss patient from practice. She has 2 no shows with Dr. Brett Fairy and she canceled an EMG/NCS with me last month without 24 hour notice. Please cancel all furture appointments. Thank you

## 2019-04-20 NOTE — Telephone Encounter (Signed)
Please do not reschedule- 2 no shows for sleep consult  CD

## 2019-04-20 NOTE — Telephone Encounter (Signed)
Patient called today advising the phone staff, something has come up last minute and she is unable to make her sleep consult apt today. This makes the patient 2nd now show for sleep consult.

## 2019-04-25 ENCOUNTER — Ambulatory Visit: Payer: Medicaid Other | Admitting: Family Medicine

## 2019-05-24 ENCOUNTER — Other Ambulatory Visit: Payer: Self-pay | Admitting: Physician Assistant

## 2019-05-26 ENCOUNTER — Other Ambulatory Visit: Payer: Self-pay

## 2019-05-26 ENCOUNTER — Ambulatory Visit
Admission: RE | Admit: 2019-05-26 | Discharge: 2019-05-26 | Disposition: A | Discharge status: Home / Self Care | Payer: Medicaid Other | Source: Ambulatory Visit | Attending: Physician Assistant | Admitting: Physician Assistant

## 2019-05-26 DIAGNOSIS — N644 Mastodynia: Secondary | ICD-10-CM

## 2019-06-14 ENCOUNTER — Other Ambulatory Visit: Payer: Self-pay | Admitting: Neurology

## 2019-06-14 ENCOUNTER — Encounter: Payer: Medicaid Other | Admitting: Neurology

## 2019-06-14 NOTE — Telephone Encounter (Signed)
Pt called and spoke with Angie in billing to inform the reason she had to miss so many apt's was due to he mother being in and out of the hospital. She states her mother passed away. She advised Angie that she really loves our practice here and loves working with Dr Jaynee Eagles and was begging we allow her to come back. Angie advised that its per our policy that we can dismiss a patient if they have 2 new patient no shows in which case she did and that there is a history of multiple cancels and rescheduled. Pt states that this was all while she was having to deal with her mother medical concerns as well as her passing. Patient is asking to please allow another change.

## 2019-06-14 NOTE — Telephone Encounter (Signed)
She did no show in September 2019 with Dr Rexene Alberts and a letter was sent reminding her of the no -show policy even then.  She followed up with 2 no shows with me this Summer.  Also, 2 no shows with primary care in the last 12 month.   I am sorry, but I can't make an exception - she should not have rescheduled if she couldn't attend.

## 2019-06-14 NOTE — Telephone Encounter (Signed)
Agree with Dr. Brett Fairy, thank you

## 2019-06-21 ENCOUNTER — Other Ambulatory Visit: Payer: Self-pay | Admitting: Neurology

## 2019-07-21 ENCOUNTER — Other Ambulatory Visit: Payer: Self-pay | Admitting: Neurology

## 2019-08-13 ENCOUNTER — Emergency Department (HOSPITAL_COMMUNITY)
Admission: EM | Admit: 2019-08-13 | Discharge: 2019-08-14 | Disposition: A | Discharge status: Home / Self Care | Payer: Medicaid Other | Attending: Emergency Medicine | Admitting: Emergency Medicine

## 2019-08-13 ENCOUNTER — Encounter (HOSPITAL_COMMUNITY): Payer: Self-pay | Admitting: Emergency Medicine

## 2019-08-13 ENCOUNTER — Other Ambulatory Visit: Payer: Self-pay

## 2019-08-13 DIAGNOSIS — R197 Diarrhea, unspecified: Secondary | ICD-10-CM | POA: Diagnosis not present

## 2019-08-13 DIAGNOSIS — E119 Type 2 diabetes mellitus without complications: Secondary | ICD-10-CM | POA: Diagnosis not present

## 2019-08-13 DIAGNOSIS — R1084 Generalized abdominal pain: Secondary | ICD-10-CM | POA: Diagnosis present

## 2019-08-13 DIAGNOSIS — E860 Dehydration: Secondary | ICD-10-CM

## 2019-08-13 DIAGNOSIS — B182 Chronic viral hepatitis C: Secondary | ICD-10-CM | POA: Insufficient documentation

## 2019-08-13 DIAGNOSIS — Z20828 Contact with and (suspected) exposure to other viral communicable diseases: Secondary | ICD-10-CM | POA: Insufficient documentation

## 2019-08-13 DIAGNOSIS — J449 Chronic obstructive pulmonary disease, unspecified: Secondary | ICD-10-CM | POA: Diagnosis not present

## 2019-08-13 DIAGNOSIS — Z79899 Other long term (current) drug therapy: Secondary | ICD-10-CM | POA: Diagnosis not present

## 2019-08-13 DIAGNOSIS — R112 Nausea with vomiting, unspecified: Secondary | ICD-10-CM | POA: Diagnosis not present

## 2019-08-13 DIAGNOSIS — J180 Bronchopneumonia, unspecified organism: Secondary | ICD-10-CM | POA: Diagnosis not present

## 2019-08-13 DIAGNOSIS — N3000 Acute cystitis without hematuria: Secondary | ICD-10-CM | POA: Insufficient documentation

## 2019-08-13 DIAGNOSIS — I10 Essential (primary) hypertension: Secondary | ICD-10-CM | POA: Diagnosis not present

## 2019-08-13 DIAGNOSIS — R10816 Epigastric abdominal tenderness: Secondary | ICD-10-CM | POA: Insufficient documentation

## 2019-08-13 DIAGNOSIS — R1013 Epigastric pain: Secondary | ICD-10-CM

## 2019-08-13 DIAGNOSIS — Z20822 Contact with and (suspected) exposure to covid-19: Secondary | ICD-10-CM

## 2019-08-13 LAB — URINALYSIS, ROUTINE W REFLEX MICROSCOPIC
Bilirubin Urine: NEGATIVE
Glucose, UA: NEGATIVE mg/dL
Ketones, ur: NEGATIVE mg/dL
Nitrite: NEGATIVE
Protein, ur: >=300 mg/dL — AB
Specific Gravity, Urine: 1.007 (ref 1.005–1.030)
pH: 6 (ref 5.0–8.0)

## 2019-08-13 LAB — LIPASE, BLOOD: Lipase: 36 U/L (ref 11–51)

## 2019-08-13 LAB — COMPREHENSIVE METABOLIC PANEL
ALT: 110 U/L — ABNORMAL HIGH (ref 0–44)
AST: 121 U/L — ABNORMAL HIGH (ref 15–41)
Albumin: 3.3 g/dL — ABNORMAL LOW (ref 3.5–5.0)
Alkaline Phosphatase: 84 U/L (ref 38–126)
Anion gap: 9 (ref 5–15)
BUN: 19 mg/dL (ref 6–20)
CO2: 22 mmol/L (ref 22–32)
Calcium: 8.7 mg/dL — ABNORMAL LOW (ref 8.9–10.3)
Chloride: 106 mmol/L (ref 98–111)
Creatinine, Ser: 1.02 mg/dL — ABNORMAL HIGH (ref 0.44–1.00)
GFR calc Af Amer: >60 mL/min (ref >60)
GFR calc non Af Amer: 60 mL/min — ABNORMAL LOW (ref >60)
Glucose, Bld: 105 mg/dL — ABNORMAL HIGH (ref 70–99)
Potassium: 3.8 mmol/L (ref 3.5–5.1)
Sodium: 137 mmol/L (ref 135–145)
Total Bilirubin: 1.1 mg/dL (ref 0.3–1.2)
Total Protein: 6 g/dL — ABNORMAL LOW (ref 6.5–8.1)

## 2019-08-13 LAB — CBC
HCT: 43.3 % (ref 36.0–46.0)
Hemoglobin: 15.4 g/dL — ABNORMAL HIGH (ref 12.0–15.0)
MCH: 33 pg (ref 26.0–34.0)
MCHC: 35.6 g/dL (ref 30.0–36.0)
MCV: 92.9 fL (ref 80.0–100.0)
Platelets: 179 10*3/uL (ref 150–400)
RBC: 4.66 MIL/uL (ref 3.87–5.11)
RDW: 12.1 % (ref 11.5–15.5)
WBC: 10.2 10*3/uL (ref 4.0–10.5)
nRBC: 0 % (ref 0.0–0.2)

## 2019-08-13 MED ORDER — SODIUM CHLORIDE 0.9% FLUSH: 3.0000 mL | Freq: Once | INTRAVENOUS | Status: DC

## 2019-08-13 NOTE — ED Triage Notes (Signed)
Patient reports mid abdominal pain onset yesterday with constipation , emesis this evening , no diarrhea or fever .

## 2019-08-14 ENCOUNTER — Emergency Department (HOSPITAL_COMMUNITY): Payer: Medicaid Other

## 2019-08-14 LAB — POC SARS CORONAVIRUS 2 AG -  ED: SARS Coronavirus 2 Ag: NEGATIVE

## 2019-08-14 MED ORDER — ONDANSETRON HCL 4 MG PO TABS: 4.0000 mg | ORAL_TABLET | Freq: Three times a day (TID) | ORAL | 0 refills | Status: DC | PRN

## 2019-08-14 MED ORDER — MORPHINE SULFATE (PF) 4 MG/ML IV SOLN
4.0000 mg | Freq: Once | INTRAVENOUS | Status: AC
  Administered 2019-08-14: 4 mg via INTRAVENOUS
  Filled 2019-08-14: qty 1

## 2019-08-14 MED ORDER — LEVOFLOXACIN 750 MG PO TABS: 750.0000 mg | ORAL_TABLET | Freq: Every day | ORAL | 0 refills | Status: AC

## 2019-08-14 MED ORDER — ONDANSETRON HCL 4 MG/2ML IJ SOLN
4.0000 mg | Freq: Once | INTRAMUSCULAR | Status: AC
  Administered 2019-08-14: 4 mg via INTRAVENOUS
  Filled 2019-08-14: qty 2

## 2019-08-14 MED ORDER — IOHEXOL 300 MG/ML  SOLN
100.0000 mL | Freq: Once | INTRAMUSCULAR | Status: AC | PRN
  Administered 2019-08-14: 100 mL via INTRAVENOUS

## 2019-08-14 MED ORDER — SODIUM CHLORIDE 0.9 % IV BOLUS
1000.0000 mL | Freq: Once | INTRAVENOUS | Status: AC
  Administered 2019-08-14: 1000 mL via INTRAVENOUS

## 2019-08-14 NOTE — Discharge Instructions (Signed)
Take antibiotics as prescribed. Take entire course, even if your symptoms improve. You are being tested for Covid. Results should return in the next several days. If positive, you will receive a phone call. If negative, you will not. Either way, you may check online MyChart. Use zofran as needed for nausea or vomiting. Make sure you stay well hydrated with water. Return to the emergency room if you develop high fevers, persistent vomiting despite medication, difficulty breathing, any new, worsening, or concerning symptoms.

## 2019-08-14 NOTE — ED Notes (Signed)
Pt discharge instructions and prescriptions reviewed. Pt verbalized understanding of both discharge instructions and prescriptions. Pt discharged.

## 2019-08-14 NOTE — ED Provider Notes (Signed)
  Physical Exam  BP (!) 130/57   Pulse (!) 53   Temp 98.4 F (36.9 C) (Oral)   Resp (!) 22   Ht 4\' 11"  (1.499 m)   Wt 100.2 kg   LMP  (LMP Unknown)   SpO2 94%   BMI 44.64 kg/m   Physical Exam  Gen: appears nontoxic Pulm: no respiratory distress  ED Course/Procedures   Clinical Course as of Aug 13 649  [facility] Aug 14, 2019  0421 Hypertensive  BP(!): 175/92 [HM]    Clinical Course User Index [HM] Muthersbaugh, Jarrett Soho, PA-C    Procedures  MDM   Pt signed out to be my H Muthersbaugh, PA-C. Please see previous notes for further history.   In brief, pt presenting for evaluation of n/v, abd pain. She has had decreased appetite, taste and smell. +covid contact. Labs at baseline with elevated LFTs. pending CT for further evaluation.  Urine with trace leuks, rare bacteria, 6-10 white cells, large blood.  Not obviously positive, however patient states she has had multiple UTIs.  Considering her age and risk factors, will treat for UTI and send for culture.  CT shows probable bronchopneumonia.  Some perinephric stranding with delayed release.  With the equivocal urine, will treat for possible Pilo.  As such, will give Levaquin to treat for bronchopneumonia and Pilo.  Patient also receiving a send out Covid test for further accuracy, as her symptoms may be due to coronavirus.  Discussed findings and plan with patient.  Discussed importance of hydration.  Discussed close monitoring of symptoms and return with any worsening respiratory status.  At this time, patient appears safe for discharge.  Return precautions given.  Patient states she understands and agrees to plan.      Franchot Heidelberg, PA-C 08/14/19 1914    Wyvonnia Dusky, MD 08/14/19 1515

## 2019-08-14 NOTE — ED Provider Notes (Signed)
[facility] EMERGENCY DEPARTMENT Provider Note   CSN: [id number] Arrival date & time: 08/13/19  2036     History   Chief Complaint Chief Complaint  Patient presents with   Abdominal Pain   Emesis    HPI Susan Hogan is a 58 y.o. female with a hx of hepatitis C, diabetes, COPD, hypertension presents to the Emergency Department complaining of gradual, persistent, progressively worsening gastric abdominal pain with associated nausea and vomiting around lunchtime today.  Patient reports she has felt vaguely unwell for approximately 24 hours.  She reports that her son has been exposed to Covid positive people and she is concerned this may be the reason.  She reports some changes in taste and smell.  As she denies headache, neck pain, fever, chills, chest pain, shortness of breath, dysuria, hematuria, weakness, dizziness, syncope.  Patient reports multiple episodes of nonbloody nonbilious emesis.  She also reports associated diarrhea.  No melena or hematochezia.  Patient reports a history of cholecystectomy.  She does not have a history of diverticulitis.  Nothing seems to make her symptoms better or worse.     The history is provided by the patient and medical records. No language interpreter was used.    Past Medical History:  Diagnosis Date   Anemia    Arthritis    RA "states does not see Rheumatologist"   Asthma    Carpal tunnel syndrome    bilaterally   Complication of anesthesia    difficulty breathing   COPD (chronic obstructive pulmonary disease) (HCC)    Degenerative disk disease    back   Depression    Diabetes mellitus    Fibromyalgia    GERD (gastroesophageal reflux disease)    Headache(784.0)    migraines   Heart murmur    Hepatitis    In followup treatment for hepatitis C   Hypertension    no medication for at this time, sees Dr. Dinah Beers Pcp   Hypotensive episode    hx of   Irritable bowel syndrome    Kidney  stone    with hematuria   Memory loss    Recurrent upper respiratory infection (URI)    Shortness of breath    Syncope     Patient Active Problem List   Diagnosis Date Noted   Chronic migraine without aura, with intractable migraine, so stated, with status migrainosus 03/01/2019   Medication overuse headache 03/01/2019   Seasonal allergic rhinitis due to pollen 03/25/2018   Adnexal cyst 03/25/2018   Morbid obesity ([place]) 03/25/2018   Essential hypertension 03/25/2018   Pure hypercholesterolemia 03/25/2018   Sleep-disordered breathing 03/25/2018   Vitamin D deficiency 03/25/2018   Bilateral lower extremity edema 03/25/2018   Acute meniscal tear of left knee 11/24/2016   Chronic hepatitis C without hepatic coma ([place]) 11/24/2016   Fatty liver 11/24/2016   H/O degenerative disc disease 11/24/2016   COPD (chronic obstructive pulmonary disease) ([place]) 10/22/2016   Type 2 diabetes mellitus, cannot tolerate Metformin 07/25/2014   Fibromyalgia 07/25/2014   MDD (major depressive disorder), recurrent severe, without psychosis ([place]) 03/17/2013   Hepatitis C 08/02/2007   IBS (irritable bowel syndrome) 08/02/2007   Mild persistent asthma with acute exacerbation 09/21/1962    Past Surgical History:  Procedure Laterality Date   CHOLECYSTECTOMY  2019   DILATION AND CURETTAGE OF UTERUS     Left knee surgery     LIVER BIOPSY     TONSILLECTOMY     TUBAL LIGATION  OB History   No obstetric history on file.      Home Medications    Prior to Admission medications   Medication Sig Start Date End Date Taking? Authorizing Provider  albuterol (PROVENTIL HFA;VENTOLIN HFA) 108 (90 Base) MCG/ACT inhaler Inhale 1-2 puffs into the lungs every 6 (six) hours as needed for wheezing or shortness of breath. 03/03/18   Couture, Cortni S, PA-C  blood glucose meter kit and supplies KIT Dispense based on patient and insurance preference. Use up to four times daily as  directed. (FOR ICD-9 250.00, 250.01). 03/25/18   Briscoe Deutscher, DO  diphenhydrAMINE (BENADRYL ALLERGY) 25 mg capsule Take 1 capsule (25 mg total) by mouth every 6 (six) hours as needed. 03/20/19   Melvenia Beam, MD  escitalopram (LEXAPRO) 10 MG tablet TAKE 1 TABLET(10 MG) BY MOUTH DAILY Patient not taking: Reported on 07/05/2018 06/01/18   Briscoe Deutscher, DO  Fremanezumab-vfrm (AJOVY) 225 MG/1.5ML SOSY Inject 675 mg into the skin every 3 (three) months. Used samples today. She is due for next shots 05/23/2019 will try and get approved. 03/09/19   Melvenia Beam, MD  glucose blood (ACCU-CHEK ACTIVE STRIPS) test strip Use as instructed 04/15/16   Nona Dell, PA-C  prochlorperazine (COMPAZINE) 10 MG tablet Take 1 tablet (10 mg total) by mouth every 6 (six) hours as needed. May take with '25mg'$  of benadryl for migraines.. 03/09/19   Melvenia Beam, MD  rizatriptan (MAXALT-MLT) 10 MG disintegrating tablet Take 1 tablet (10 mg total) by mouth as needed for migraine. May repeat in 2 hours if needed. Maximum 2x in one day. 03/16/19   Melvenia Beam, MD  topiramate (TOPAMAX) 100 MG tablet Take 1 tablet (100 mg total) by mouth at bedtime. 03/09/19   Melvenia Beam, MD    Family History Family History  Problem Relation Age of Onset   Heart disease Mother    Diabetes Mother    Asthma Mother    Anemia Mother    Cerebral aneurysm Mother        ruptured   Kidney failure Mother    Migraines Mother    Parkinson's disease Mother    Colon cancer Maternal Uncle    Heart disease Maternal Uncle    Heart disease Maternal Grandmother    Diabetes Maternal Grandmother    Asthma Maternal Grandmother    Heart Problems Maternal Grandmother    Heart Problems Other        through family   Diabetes Granddaughter    Diabetes Daughter     Social History Social History   Tobacco Use   Smoking status: Never Smoker   Smokeless tobacco: Never Used  Substance Use Topics   Alcohol  use: Never    Alcohol/week: 0.0 standard drinks    Frequency: Never   Drug use: Never     Allergies   Anesthetic ether [ether], Nubain [nalbuphine hcl], Penicillins, Lisinopril, Advair diskus [fluticasone-salmeterol], and Lactose intolerance (gi)   Review of Systems Review of Systems  Constitutional: Negative for appetite change, diaphoresis, fatigue, fever and unexpected weight change.  HENT: Negative for mouth sores.   Eyes: Negative for visual disturbance.  Respiratory: Negative for cough, chest tightness, shortness of breath and wheezing.   Cardiovascular: Negative for chest pain.  Gastrointestinal: Positive for abdominal pain, diarrhea, nausea and vomiting. Negative for constipation.  Endocrine: Negative for polydipsia, polyphagia and polyuria.  Genitourinary: Negative for dysuria, frequency, hematuria and urgency.  Musculoskeletal: Negative for back pain and neck stiffness.  Skin: Negative for rash.  Allergic/Immunologic: Negative for immunocompromised state.  Neurological: Negative for syncope, light-headedness and headaches.  Hematological: Does not bruise/bleed easily.  Psychiatric/Behavioral: Negative for sleep disturbance. The patient is not nervous/anxious.      Physical Exam Updated Vital Signs BP (!) 154/62    Pulse 64    Temp 98.4 F (36.9 C) (Oral)    Resp (!) 22    Ht _0  (1.499 m)    Wt 100.2 kg    LMP  (LMP Unknown)    SpO2 96%    BMI 44.64 kg/m   Physical Exam Vitals signs and nursing note reviewed.  Constitutional:      General: She is not in acute distress.    Appearance: She is not diaphoretic.  HENT:     Head: Normocephalic.  Eyes:     General: No scleral icterus.    Conjunctiva/sclera: Conjunctivae normal.  Neck:     Musculoskeletal: Normal range of motion.  Cardiovascular:     Rate and Rhythm: Normal rate and regular rhythm.     Pulses: Normal pulses.          Radial pulses are 2+ on the right side and 2+ on the left side.  Pulmonary:       Effort: No tachypnea, accessory muscle usage, prolonged expiration, respiratory distress or retractions.     Breath sounds: No stridor.     Comments: Equal chest rise. No increased work of breathing. Abdominal:     General: There is no distension.     Palpations: Abdomen is soft.     Tenderness: There is generalized abdominal tenderness and tenderness in the epigastric area. There is no right CVA tenderness, left CVA tenderness, guarding or rebound.  Musculoskeletal:     Comments: Moves all extremities equally and without difficulty.  Skin:    General: Skin is warm and dry.     Capillary Refill: Capillary refill takes less than 2 seconds.  Neurological:     Mental Status: She is alert.     GCS: GCS eye subscore is 4. GCS verbal subscore is 5. GCS motor subscore is 6.     Comments: Speech is clear and goal oriented.  Psychiatric:        Mood and Affect: Mood normal.      ED Treatments / Results  Labs (all labs ordered are listed, but only abnormal results are displayed) Labs Reviewed  COMPREHENSIVE METABOLIC PANEL - Abnormal; Notable for the following components:      Result Value   Glucose, Bld 105 (*)    Creatinine, Ser 1.02 (*)    Calcium 8.7 (*)    Total Protein 6.0 (*)    Albumin 3.3 (*)    AST 121 (*)    ALT 110 (*)    GFR calc non Af Amer 60 (*)    All other components within normal limits  CBC - Abnormal; Notable for the following components:   Hemoglobin 15.4 (*)    All other components within normal limits  URINALYSIS, ROUTINE W REFLEX MICROSCOPIC - Abnormal; Notable for the following components:   Hgb urine dipstick LARGE (*)    Protein, ur >=300 (*)    Leukocytes,Ua TRACE (*)    Bacteria, UA RARE (*)    All other components within normal limits  LIPASE, BLOOD  I-STAT BETA HCG BLOOD, ED ([facility], [facility], [facility] ONLY)  POC SARS CORONAVIRUS 2 AG -  ED     Procedures Procedures (including critical care  time)  Medications Ordered in ED Medications  sodium  chloride flush (NS) 0.9 % injection 3 mL (3 mLs Intravenous Not Given 08/14/19 0512)  sodium chloride 0.9 % bolus 1,000 mL (0 mLs Intravenous Stopped 08/14/19 0512)  ondansetron (ZOFRAN) injection 4 mg (4 mg Intravenous Given 08/14/19 0358)  morphine 4 MG/ML injection 4 mg (4 mg Intravenous Given 08/14/19 0359)  iohexol (OMNIPAQUE) 300 MG/ML solution 100 mL (100 mLs Intravenous Contrast Given 08/14/19 0630)     Initial Impression / Assessment and Plan / ED Course  I have reviewed the triage vital signs and the nursing notes.  Pertinent labs & imaging results that were available during my care of the patient were reviewed by me and considered in my medical decision making (see chart for details).  Clinical Course as of Aug 13 636  [facility] Aug 14, 2019  0421 Hypertensive  BP(!): 175/92 [HM]    Clinical Course User Index [HM] Melinda Gwinner, Jarrett Soho, [place]       Patient presents with abdominal pain nausea, vomiting and diarrhea.  She has had positive Covid exposures.  Work-up shows worsening creatinine.  AST ALT seem to be at baseline for patient with chronic hepatitis.  Urinalysis with large amount of hemoglobin, protein and trace leukocytes.  Question possible urinary tract infection.  No leukocytosis.  We will give pain medication, test for Covid.    5:37 AM Covid test negative.  Patient continues to have mild abdominal pain.  Her vomiting has improved at this time.  Will obtain CT scan for further evaluation of intra-abdominal pathology including possible appendicitis, colitis.  6:37 AM At shift change care was transferred to [facility], PA-C who will follow pending studies, re-evaulate and determine disposition.     Final Clinical Impressions(s) / ED Diagnoses   Final diagnoses:  Nausea vomiting and diarrhea  Epigastric pain    ED Discharge Orders    None       Agapito Games 08/14/19 [place], Delice Bison, DO 08/14/19 [phone]

## 2019-08-16 LAB — URINE CULTURE: Culture: >=100000 — AB

## 2019-08-17 ENCOUNTER — Telehealth: Payer: Self-pay | Admitting: Emergency Medicine

## 2019-08-17 NOTE — Telephone Encounter (Signed)
Post ED Visit - Positive Culture Follow-up  Culture report reviewed by antimicrobial stewardship pharmacist: [place] Team []  [address], Pharm.D. []  Heide Guile, Pharm.D., BCPS AQ-ID []  Parks Neptune, Pharm.D., BCPS []  Alycia Rossetti, Pharm.D., BCPS []  [place], Pharm.D., BCPS, AAHIVP []  Legrand Como, Pharm.D., BCPS, AAHIVP []  Salome Arnt, PharmD, BCPS []  Johnnette Gourd, PharmD, BCPS [x]  Hughes Better, PharmD, BCPS []  Leeroy Cha, PharmD []  Laqueta Linden, PharmD, BCPS []  Albertina Parr, PharmD  [facility] Team []  Leodis Sias, PharmD []  Lindell Spar, PharmD []  Royetta Asal, PharmD []  Graylin Shiver, Rph []  Rema Fendt) Glennon Mac, PharmD []  Arlyn Dunning, PharmD []  Netta Cedars, PharmD []  Dia Sitter, PharmD []  Leone Haven, PharmD []  Gretta Arab, PharmD []  Theodis Shove, PharmD []  Peggyann Juba, PharmD []  Reuel Boom, PharmD   Positive urine culture Treated with Levofloxacin, organism sensitive to the same and no further patient follow-up is required at this time.  Sandi Raveling Makaylie Dedeaux 08/17/2019, 3:26 PM

## 2019-08-18 LAB — NOVEL CORONAVIRUS, NAA (HOSP ORDER, SEND-OUT TO REF LAB; TAT 18-24 HRS): SARS-CoV-2, NAA: DETECTED — AB

## 2019-08-19 ENCOUNTER — Emergency Department (HOSPITAL_COMMUNITY): Payer: Medicaid Other

## 2019-08-19 ENCOUNTER — Other Ambulatory Visit: Payer: Self-pay

## 2019-08-19 ENCOUNTER — Encounter (HOSPITAL_COMMUNITY): Payer: Self-pay

## 2019-08-19 ENCOUNTER — Emergency Department (HOSPITAL_COMMUNITY)
Admission: EM | Admit: 2019-08-19 | Discharge: 2019-08-20 | Disposition: A | Discharge status: Home / Self Care | Payer: Medicaid Other | Attending: Emergency Medicine | Admitting: Emergency Medicine

## 2019-08-19 DIAGNOSIS — R0602 Shortness of breath: Secondary | ICD-10-CM

## 2019-08-19 DIAGNOSIS — U071 COVID-19: Secondary | ICD-10-CM | POA: Insufficient documentation

## 2019-08-19 DIAGNOSIS — R1084 Generalized abdominal pain: Secondary | ICD-10-CM

## 2019-08-19 DIAGNOSIS — J449 Chronic obstructive pulmonary disease, unspecified: Secondary | ICD-10-CM | POA: Insufficient documentation

## 2019-08-19 DIAGNOSIS — R519 Headache, unspecified: Secondary | ICD-10-CM | POA: Insufficient documentation

## 2019-08-19 DIAGNOSIS — R071 Chest pain on breathing: Secondary | ICD-10-CM | POA: Insufficient documentation

## 2019-08-19 DIAGNOSIS — Z79899 Other long term (current) drug therapy: Secondary | ICD-10-CM | POA: Insufficient documentation

## 2019-08-19 DIAGNOSIS — R197 Diarrhea, unspecified: Secondary | ICD-10-CM

## 2019-08-19 DIAGNOSIS — E119 Type 2 diabetes mellitus without complications: Secondary | ICD-10-CM | POA: Diagnosis not present

## 2019-08-19 DIAGNOSIS — R112 Nausea with vomiting, unspecified: Secondary | ICD-10-CM | POA: Diagnosis not present

## 2019-08-19 DIAGNOSIS — I10 Essential (primary) hypertension: Secondary | ICD-10-CM | POA: Diagnosis not present

## 2019-08-19 DIAGNOSIS — R109 Unspecified abdominal pain: Secondary | ICD-10-CM | POA: Diagnosis present

## 2019-08-19 LAB — CBC WITH DIFFERENTIAL/PLATELET
Abs Immature Granulocytes: 0.02 10*3/uL (ref 0.00–0.07)
Basophils Absolute: 0 10*3/uL (ref 0.0–0.1)
Basophils Relative: 0 %
Eosinophils Absolute: 0.1 10*3/uL (ref 0.0–0.5)
Eosinophils Relative: 1 %
HCT: 38.4 % (ref 36.0–46.0)
Hemoglobin: 13.5 g/dL (ref 12.0–15.0)
Immature Granulocytes: 0 %
Lymphocytes Relative: 22 %
Lymphs Abs: 2.1 10*3/uL (ref 0.7–4.0)
MCH: 33.3 pg (ref 26.0–34.0)
MCHC: 35.2 g/dL (ref 30.0–36.0)
MCV: 94.6 fL (ref 80.0–100.0)
Monocytes Absolute: 0.7 10*3/uL (ref 0.1–1.0)
Monocytes Relative: 7 %
Neutro Abs: 6.7 10*3/uL (ref 1.7–7.7)
Neutrophils Relative %: 70 %
Platelets: 184 10*3/uL (ref 150–400)
RBC: 4.06 MIL/uL (ref 3.87–5.11)
RDW: 12.7 % (ref 11.5–15.5)
WBC: 9.5 10*3/uL (ref 4.0–10.5)
nRBC: 0 % (ref 0.0–0.2)

## 2019-08-19 LAB — COMPREHENSIVE METABOLIC PANEL
ALT: 69 U/L — ABNORMAL HIGH (ref 0–44)
AST: 74 U/L — ABNORMAL HIGH (ref 15–41)
Albumin: 3.2 g/dL — ABNORMAL LOW (ref 3.5–5.0)
Alkaline Phosphatase: 73 U/L (ref 38–126)
Anion gap: 10 (ref 5–15)
BUN: 12 mg/dL (ref 6–20)
CO2: 25 mmol/L (ref 22–32)
Calcium: 9.1 mg/dL (ref 8.9–10.3)
Chloride: 109 mmol/L (ref 98–111)
Creatinine, Ser: 0.8 mg/dL (ref 0.44–1.00)
GFR calc Af Amer: >60 mL/min (ref >60)
GFR calc non Af Amer: >60 mL/min (ref >60)
Glucose, Bld: 98 mg/dL (ref 70–99)
Potassium: 3.5 mmol/L (ref 3.5–5.1)
Sodium: 144 mmol/L (ref 135–145)
Total Bilirubin: 0.7 mg/dL (ref 0.3–1.2)
Total Protein: 5.9 g/dL — ABNORMAL LOW (ref 6.5–8.1)

## 2019-08-19 LAB — LACTIC ACID, PLASMA
Lactic Acid, Venous: 0.8 mmol/L (ref 0.5–1.9)
Lactic Acid, Venous: 1.1 mmol/L (ref 0.5–1.9)

## 2019-08-19 LAB — URINALYSIS, ROUTINE W REFLEX MICROSCOPIC
Bilirubin Urine: NEGATIVE
Glucose, UA: NEGATIVE mg/dL
Ketones, ur: NEGATIVE mg/dL
Leukocytes,Ua: NEGATIVE
Nitrite: NEGATIVE
Protein, ur: 100 mg/dL — AB
Specific Gravity, Urine: 1.009 (ref 1.005–1.030)
pH: 6 (ref 5.0–8.0)

## 2019-08-19 LAB — FIBRINOGEN: Fibrinogen: 324 mg/dL (ref 210–475)

## 2019-08-19 LAB — TROPONIN I (HIGH SENSITIVITY)
Troponin I (High Sensitivity): 4 ng/L (ref <18)
Troponin I (High Sensitivity): 4 ng/L (ref <18)

## 2019-08-19 LAB — TRIGLYCERIDES: Triglycerides: 102 mg/dL (ref <150)

## 2019-08-19 LAB — D-DIMER, QUANTITATIVE: D-Dimer, Quant: 1.1 ug/mL-FEU — ABNORMAL HIGH (ref 0.00–0.50)

## 2019-08-19 LAB — C-REACTIVE PROTEIN: CRP: <0.8 mg/dL (ref <1.0)

## 2019-08-19 LAB — I-STAT BETA HCG BLOOD, ED (MC, WL, AP ONLY): I-stat hCG, quantitative: <5 m[IU]/mL (ref <5)

## 2019-08-19 LAB — PROCALCITONIN: Procalcitonin: <0.1 ng/mL

## 2019-08-19 LAB — FERRITIN: Ferritin: 360 ng/mL — ABNORMAL HIGH (ref 11–307)

## 2019-08-19 LAB — LACTATE DEHYDROGENASE: LDH: 269 U/L — ABNORMAL HIGH (ref 98–192)

## 2019-08-19 LAB — LIPASE, BLOOD: Lipase: 17 U/L (ref 11–51)

## 2019-08-19 MED ORDER — HYDROMORPHONE HCL 1 MG/ML IJ SOLN
1.0000 mg | Freq: Once | INTRAMUSCULAR | Status: AC
  Administered 2019-08-19: 1 mg via INTRAVENOUS
  Filled 2019-08-19: qty 1

## 2019-08-19 MED ORDER — ONDANSETRON HCL 4 MG/2ML IJ SOLN
4.0000 mg | Freq: Once | INTRAMUSCULAR | Status: AC
  Administered 2019-08-19: 4 mg via INTRAVENOUS
  Filled 2019-08-19: qty 2

## 2019-08-19 MED ORDER — PROMETHAZINE HCL 25 MG/ML IJ SOLN
12.5000 mg | Freq: Once | INTRAMUSCULAR | Status: AC
  Administered 2019-08-19: 21:00:00 12.5 mg via INTRAVENOUS
  Filled 2019-08-19: qty 1

## 2019-08-19 MED ORDER — PROMETHAZINE HCL 25 MG/ML IJ SOLN
12.5000 mg | Freq: Once | INTRAMUSCULAR | Status: AC
  Administered 2019-08-19: 12.5 mg via INTRAVENOUS
  Filled 2019-08-19: qty 1

## 2019-08-19 MED ORDER — MORPHINE SULFATE (PF) 4 MG/ML IV SOLN
4.0000 mg | Freq: Once | INTRAVENOUS | Status: AC
  Administered 2019-08-19: 4 mg via INTRAVENOUS
  Filled 2019-08-19: qty 1

## 2019-08-19 MED ORDER — IOHEXOL 350 MG/ML SOLN
75.0000 mL | Freq: Once | INTRAVENOUS | Status: AC | PRN
  Administered 2019-08-19: 75 mL via INTRAVENOUS

## 2019-08-19 MED ORDER — ONDANSETRON 4 MG PO TBDP: 4.0000 mg | ORAL_TABLET | Freq: Three times a day (TID) | ORAL | 0 refills | Status: DC | PRN

## 2019-08-19 MED ORDER — DICYCLOMINE HCL 20 MG PO TABS: 20.0000 mg | ORAL_TABLET | Freq: Two times a day (BID) | ORAL | 0 refills | Status: DC

## 2019-08-19 MED ORDER — SODIUM CHLORIDE 0.9 % IV BOLUS
1000.0000 mL | Freq: Once | INTRAVENOUS | Status: AC
  Administered 2019-08-19: 1000 mL via INTRAVENOUS

## 2019-08-19 NOTE — ED Notes (Signed)
Pt was sitting in designated Covid area as marked by [facility] staff. Pt pulled her mask down. I told the patient to put her mask back on per policy. Pt states "I am making a phone call." Pt told to put her mask on again.

## 2019-08-19 NOTE — ED Triage Notes (Signed)
Pt arrive POV for eval of ongoing abd pain and SOB. Pt reports she received a call yesterday taht she was Covid positive. Pt reports she was tested on the 22nd, didn't feel well on thanksgiving but went to her large family gathering anyways. Reports now that she knows she is positive she feels worse and her sx are not improving

## 2019-08-19 NOTE — Discharge Instructions (Addendum)
Nausea, Vomiting, and Diarrhea  Hand washing: Wash your hands throughout the day, but especially before and after touching the face, using the restroom, sneezing, coughing, or touching surfaces that have been coughed or sneezed upon. Hydration: Symptoms will be intensified and complicated by dehydration. Dehydration can also extend the duration of symptoms. Drink plenty of fluids and get plenty of rest. You should be drinking at least half a liter of water an hour to stay hydrated. Electrolyte drinks (ex. Gatorade, Powerade, Pedialyte) are also encouraged. You should be drinking enough fluids to make your urine light yellow, almost clear. If this is not the case, you are not drinking enough water. Please note that some of the treatments indicated below will not be effective if you are not adequately hydrated. Diet: Please concentrate on hydration, however, you may introduce food slowly.  Start with a clear liquid diet, progressed to a full liquid diet, and then bland solids as you are able. Pain or fever: Ibuprofen, Naproxen, or Tylenol for pain or fever.  Nausea/vomiting: Use the ondansetron (generic for Zofran) for nausea or vomiting.  This medication may not prevent all vomiting or nausea, but can help facilitate better hydration. Things that can help with nausea/vomiting also include peppermint/menthol candies, vitamin B12, and ginger. Diarrhea: May use medications such as loperamide (Imodium) or Bismuth subsalicylate (Pepto-Bismol). Bentyl: This medication is what is known as an antispasmodic and is intended to help reduce abdominal discomfort. Follow-up: Follow-up with a primary care provider on this matter. Return: Return should you develop a fever, bloody diarrhea, increased abdominal pain, uncontrolled vomiting, or any other major concerns.  For prescription assistance, may try using prescription discount sites or apps, such as goodrx.com  Patients who have symptoms consistent with COVID-19  should self isolate until: At least 3 days (72 hours) have passed since recovery, defined as resolution of fever without the use of fever reducing medications and improvement in respiratory symptoms (e.g., cough, shortness of breath), and At least 7 days have passed since symptoms first appeared. Retesting is not required and not recommended as patients can continue to test positive for several weeks despite lack of symptoms.

## 2019-08-19 NOTE — ED Provider Notes (Signed)
[place] EMERGENCY DEPARTMENT Provider Note   CSN: [id number] Arrival date & time: 08/19/19  1722    History   Chief Complaint Chief Complaint  Patient presents with   Covid Positive   HPI Susan Hogan is a 58 y.o. female with Covid positive, COPD, hepatitis C, diabetes presents for evaluation of abdominal pain and shortness of breath.  Found that yesterday she was Covid positive.  Had symptoms over the last 6 days.  Was seen in emergency department on the 23rd for similar symptoms. Prior ED visit patient had CT scan showed patchy bilateral lung bases consistent with bronchopneumonia, nonspecific perinephric stranding.  Also diagnosed with UTI, urine culture grew greater than 100,000 E. Coli. Given abd of Levaquin at DC.  Completed full course of antibiotics.  Patient admits to gradual onset headache, shortness of breath, intermittent pleuritic chest pain, body aches and pains, abdominal cramping, malodorous urine, diarrhea as well as nausea and vomiting.  Patient admits to episodes of NBNB emesis.  Denies fever, chills, sudden onset thunderclap headache, dizziness, lightheadedness, neck pain, neck stiffness exertional chest pain, congestion, rhinorrhea, hemoptysis, lateral weakness, facial asymmetry.  Denies additional aggravating or alleviating factors.  Rates her current pain a 9/10.  History obtained from patient past and past medical records.  No interpreter is used.     HPI  Past Medical History:  Diagnosis Date   Anemia    Arthritis    RA "states does not see Rheumatologist"   Asthma    Carpal tunnel syndrome    bilaterally   Complication of anesthesia    difficulty breathing   COPD (chronic obstructive pulmonary disease) (HCC)    Degenerative disk disease    back   Depression    Diabetes mellitus    Fibromyalgia    GERD (gastroesophageal reflux disease)    Headache(784.0)    migraines   Heart murmur    Hepatitis    In followup  treatment for hepatitis C   Hypertension    no medication for at this time, sees Dr. Dinah Beers Pcp   Hypotensive episode    hx of   Irritable bowel syndrome    Kidney stone    with hematuria   Memory loss    Recurrent upper respiratory infection (URI)    Shortness of breath    Syncope     Patient Active Problem List   Diagnosis Date Noted   Chronic migraine without aura, with intractable migraine, so stated, with status migrainosus 03/01/2019   Medication overuse headache 03/01/2019   Seasonal allergic rhinitis due to pollen 03/25/2018   Adnexal cyst 03/25/2018   Morbid obesity ([place]) 03/25/2018   Essential hypertension 03/25/2018   Pure hypercholesterolemia 03/25/2018   Sleep-disordered breathing 03/25/2018   Vitamin D deficiency 03/25/2018   Bilateral lower extremity edema 03/25/2018   Acute meniscal tear of left knee 11/24/2016   Chronic hepatitis C without hepatic coma ([place]) 11/24/2016   Fatty liver 11/24/2016   H/O degenerative disc disease 11/24/2016   COPD (chronic obstructive pulmonary disease) ([place]) 10/22/2016   Type 2 diabetes mellitus, cannot tolerate Metformin 07/25/2014   Fibromyalgia 07/25/2014   MDD (major depressive disorder), recurrent severe, without psychosis ([place]) 03/17/2013   Hepatitis C 08/02/2007   IBS (irritable bowel syndrome) 08/02/2007   Mild persistent asthma with acute exacerbation 09/21/1962    Past Surgical History:  Procedure Laterality Date   CHOLECYSTECTOMY  2019   DILATION AND CURETTAGE OF UTERUS     Left knee surgery  LIVER BIOPSY     TONSILLECTOMY     TUBAL LIGATION       OB History   No obstetric history on file.      Home Medications    Prior to Admission medications   Medication Sig Start Date End Date Taking? Authorizing Provider  albuterol (PROVENTIL HFA;VENTOLIN HFA) 108 (90 Base) MCG/ACT inhaler Inhale 1-2 puffs into the lungs every 6 (six) hours as needed for wheezing or  shortness of breath. 03/03/18   Couture, Cortni S, PA-C  blood glucose meter kit and supplies KIT Dispense based on patient and insurance preference. Use up to four times daily as directed. (FOR ICD-9 250.00, 250.01). 03/25/18   Briscoe Deutscher, DO  diphenhydrAMINE (BENADRYL ALLERGY) 25 mg capsule Take 1 capsule (25 mg total) by mouth every 6 (six) hours as needed. 03/20/19   Melvenia Beam, MD  escitalopram (LEXAPRO) 10 MG tablet TAKE 1 TABLET(10 MG) BY MOUTH DAILY Patient not taking: Reported on 07/05/2018 06/01/18   Briscoe Deutscher, DO  Fremanezumab-vfrm (AJOVY) 225 MG/1.5ML SOSY Inject 675 mg into the skin every 3 (three) months. Used samples today. She is due for next shots 05/23/2019 will try and get approved. 03/09/19   Melvenia Beam, MD  glucose blood [facility] ACTIVE STRIPS) test strip Use as instructed 04/15/16   Nona Dell, PA-C  levofloxacin (LEVAQUIN) 750 MG tablet Take 1 tablet (750 mg total) by mouth daily for 5 days. 08/14/19 08/19/19  Caccavale, Sophia, PA-C  ondansetron (ZOFRAN) 4 MG tablet Take 1 tablet (4 mg total) by mouth every 8 (eight) hours as needed for nausea or vomiting. 08/14/19   Caccavale, Sophia, PA-C  prochlorperazine (COMPAZINE) 10 MG tablet Take 1 tablet (10 mg total) by mouth every 6 (six) hours as needed. May take with '25mg'$  of benadryl for migraines.. 03/09/19   Melvenia Beam, MD  rizatriptan (MAXALT-MLT) 10 MG disintegrating tablet Take 1 tablet (10 mg total) by mouth as needed for migraine. May repeat in 2 hours if needed. Maximum 2x in one day. 03/16/19   Melvenia Beam, MD  topiramate (TOPAMAX) 100 MG tablet Take 1 tablet (100 mg total) by mouth at bedtime. 03/09/19   Melvenia Beam, MD    Family History Family History  Problem Relation Age of Onset   Heart disease Mother    Diabetes Mother    Asthma Mother    Anemia Mother    Cerebral aneurysm Mother        ruptured   Kidney failure Mother    Migraines Mother    Parkinson's  disease Mother    Colon cancer Maternal Uncle    Heart disease Maternal Uncle    Heart disease Maternal Grandmother    Diabetes Maternal Grandmother    Asthma Maternal Grandmother    Heart Problems Maternal Grandmother    Heart Problems Other        through family   Diabetes Granddaughter    Diabetes Daughter     Social History Social History   Tobacco Use   Smoking status: Never Smoker   Smokeless tobacco: Never Used  Substance Use Topics   Alcohol use: Never    Alcohol/week: 0.0 standard drinks    Frequency: Never   Drug use: Never     Allergies   Anesthetic ether [ether], Nubain [nalbuphine hcl], Penicillins, Lisinopril, Advair diskus [fluticasone-salmeterol], and Lactose intolerance (gi)   Review of Systems Review of Systems  Constitutional: Positive for activity change, appetite change and fatigue. Negative  for chills and diaphoresis.  HENT: Negative.   Respiratory: Positive for cough and shortness of breath. Negative for apnea, choking, chest tightness, wheezing and stridor.   Cardiovascular: Positive for chest pain (Intermittent pleuritic CP). Negative for palpitations and leg swelling.  Gastrointestinal: Positive for abdominal pain, diarrhea, nausea and vomiting. Negative for abdominal distention, anal bleeding, blood in stool, constipation and rectal pain.  Genitourinary: Negative.   Musculoskeletal: Negative.   Skin: Negative.   Neurological: Positive for weakness (Generalized weakness) and headaches. Negative for dizziness, light-headedness and numbness.  All other systems reviewed and are negative.    Physical Exam Updated Vital Signs BP (!) 154/95 (BP Location: Right Arm)    Pulse 63    Temp 99.8 F (37.7 C)    Resp 18    Ht _0  (1.499 m)    Wt 102.5 kg    LMP  (LMP Unknown)    SpO2 99%    BMI 45.65 kg/m   Physical Exam Vitals signs and nursing note reviewed.  Constitutional:      General: She is not in acute distress.     Appearance: She is not ill-appearing, toxic-appearing or diaphoretic.  HENT:     Head: Normocephalic and atraumatic.     Jaw: There is normal jaw occlusion.     Right Ear: Tympanic membrane, ear canal and external ear normal. There is no impacted cerumen. No hemotympanum. Tympanic membrane is not injected, scarred, perforated, erythematous, retracted or bulging.     Left Ear: Tympanic membrane, ear canal and external ear normal. There is no impacted cerumen. No hemotympanum. Tympanic membrane is not injected, scarred, perforated, erythematous, retracted or bulging.     Ears:     Comments: No Mastoid tenderness.    Nose: Nose normal.     Comments: No sinus tenderness.    Mouth/Throat:     Lips: Pink.     Mouth: Mucous membranes are moist.     Pharynx: Oropharynx is clear. Uvula midline.     Comments: Posterior oropharynx clear.  Mucous membranes moist.  Tonsils without erythema or exudate.  Uvula midline without deviation.  No evidence of PTA or RPA.  No drooling, dysphasia or trismus.  Phonation normal. Neck:     Musculoskeletal: Full passive range of motion without pain and normal range of motion.     Trachea: Trachea and phonation normal.     Meningeal: Brudzinski's sign and Kernig's sign absent.     Comments: No Neck stiffness or neck rigidity.  No meningismus.  No cervical lymphadenopathy. Cardiovascular:     Pulses:          Dorsalis pedis pulses are 1+ on the right side and 1+ on the left side.       Posterior tibial pulses are 1+ on the right side and 1+ on the left side.     Comments: No murmurs rubs or gallops. Pulmonary:     Comments: Clear to auscultation bilaterally without wheeze, rhonchi or rales.  No accessory muscle usage.  Able speak in full sentences. Abdominal:     General: Bowel sounds are normal.     Palpations: Abdomen is soft.     Tenderness: There is generalized abdominal tenderness. There is no right CVA tenderness, left CVA tenderness, guarding or rebound.  Negative signs include Murphy's sign and McBurney's sign.     Hernia: No hernia is present.     Comments: Soft, nontender without rebound or guarding.  No CVA tenderness.  Musculoskeletal:  Comments: Moves all 4 extremities without difficulty.  Lower extremities without edema, erythema or warmth.  Skin:    Comments: Brisk capillary refill.  No rashes or lesions.  Neurological:     General: No focal deficit present.     Mental Status: She is alert.     Cranial Nerves: Cranial nerves are intact.     Sensory: Sensation is intact.     Motor: Motor function is intact.     Coordination: Coordination is intact.     Comments: Ambulatory in department without difficulty.  Cranial nerves II through XII grossly intact.  No facial droop.  Negative finger to nose, romberg, heel to shin.    ED Treatments / Results  Labs (all labs ordered are listed, but only abnormal results are displayed) Labs Reviewed  COMPREHENSIVE METABOLIC PANEL - Abnormal; Notable for the following components:      Result Value   Total Protein 5.9 (*)    Albumin 3.2 (*)    AST 74 (*)    ALT 69 (*)    All other components within normal limits  D-DIMER, QUANTITATIVE (NOT AT [facility]) - Abnormal; Notable for the following components:   D-Dimer, Quant 1.10 (*)    All other components within normal limits  LACTATE DEHYDROGENASE - Abnormal; Notable for the following components:   LDH 269 (*)    All other components within normal limits  FERRITIN - Abnormal; Notable for the following components:   Ferritin 360 (*)    All other components within normal limits  URINALYSIS, ROUTINE W REFLEX MICROSCOPIC - Abnormal; Notable for the following components:   APPearance HAZY (*)    Hgb urine dipstick MODERATE (*)    Protein, ur 100 (*)    Bacteria, UA RARE (*)    All other components within normal limits  CULTURE, BLOOD (ROUTINE X 2)  CULTURE, BLOOD (ROUTINE X 2)  URINE CULTURE  LACTIC ACID, PLASMA  CBC WITH  DIFFERENTIAL/PLATELET  FIBRINOGEN  C-REACTIVE PROTEIN  LIPASE, BLOOD  TRIGLYCERIDES  LACTIC ACID, PLASMA  PROCALCITONIN  I-STAT BETA HCG BLOOD, ED ([facility], [facility], [facility] ONLY)  TROPONIN I (HIGH SENSITIVITY)  TROPONIN I (HIGH SENSITIVITY)    EKG EKG Interpretation  Date/Time:  Saturday August 19 2019 19:08:52 EST Ventricular Rate:  53 PR Interval:    QRS Duration: 95 QT Interval:  436 QTC Calculation: 410 R Axis:   8 Text Interpretation: Sinus rhythm Borderline T abnormalities, inferior leads since last tracing no significant change Confirmed by Daleen Bo [phone]) on 08/19/2019 8:56:58 PM   Radiology Dg Chest Port 1 View  Result Date: 08/19/2019 CLINICAL DATA:  58 year old female with history of shortness of breath. Positive COVID-19 test. EXAM: PORTABLE CHEST 1 VIEW COMPARISON:  Chest x-ray 10/27/2018. FINDINGS: Lung volumes are normal. No consolidative airspace disease. No pleural effusions. No pneumothorax. No pulmonary nodule or mass noted. Pulmonary vasculature and the cardiomediastinal silhouette are within normal limits. IMPRESSION: No radiographic evidence of acute cardiopulmonary disease. Electronically Signed   By: Vinnie Langton M.D.   On: 08/19/2019 19:01   Dg Abd Portable 1 View  Result Date: 08/19/2019 CLINICAL DATA:  58 year old female with abdominal pain. EXAM: PORTABLE ABDOMEN - 1 VIEW COMPARISON:  CT of the abdomen pelvis dated 08/14/2019. FINDINGS: There is no bowel dilatation or evidence of obstruction. No free air or radiopaque calculi. Right upper quadrant cholecystectomy clips. The osseous structures and soft tissues are grossly unremarkable. IMPRESSION: No evidence of bowel obstruction. Electronically Signed   By: Milas Hock  Radparvar M.D.   On: 08/19/2019 20:14    Procedures Procedures (including critical care time)  Medications Ordered in ED Medications  HYDROmorphone (DILAUDID) injection 1 mg (has no administration in time range)  sodium chloride 0.9 %  bolus 1,000 mL (1,000 mLs Intravenous New Bag/Given 08/19/19 2059)  ondansetron (ZOFRAN) injection 4 mg (4 mg Intravenous Given 08/19/19 2015)  morphine 4 MG/ML injection 4 mg (4 mg Intravenous Given 08/19/19 2016)  promethazine (PHENERGAN) injection 12.5 mg (12.5 mg Intravenous Given 08/19/19 2120)     Initial Impression / Assessment and Plan / ED Course  I have reviewed the triage vital signs and the nursing notes.  Pertinent labs & imaging results that were available during my care of the patient were reviewed by me and considered in my medical decision making (see chart for details).  58 year old female known Covid positive presents for multiple complaints.  She is afebrile, nonseptic appearing.  Seen in emergency department 6 days ago for similar symptoms.  CT scan at that time showed possible bronchopneumonia and mild nonspecific perinephric stranding.  Heart and lungs clear.  She has no tachycardia, tachypnea or hypoxia in room.  Her abdomen is soft however there is generalized tenderness to palpation.  No rebound or guarding.  Nonsurgical abdomen.  Midst a generalized headache however she has a nonfocal neurologic exam without deficits.  No vision changes, dizziness, neck pain, neck stiffness, sudden onset thunderclap headache.  Also admits to intermittent pleuritic chest pain however denies any exertional chest pain, radiation to left arm, left jaw, back, diaphoresis.  Admits to persistent malodorous urine even after completing Levaquin.  Will obtain labs, EKG, IVF, medic management and reevaluate. Presentation non concerning for [facility], ICH, Meningitis, or temporal arteritis. Pt is afebrile with no focal neuro deficits, nuchal rigidity, or change in vision. Patient does not meet the SIRS or Sepsis criteria.  On repeat exam patient does not have a surgical abdomin and there are no peritoneal signs.  No indication of appendicitis, bowel obstruction, bowel perforation, cholecystitis, diverticulitis,  PID or ectopic pregnancy.     CBC without leukocytosis, Hgb 13.5 CMP with elevated LFT DDimer elevated at 1.10, Will order CTA Lipase 17 Troponin 4 Urinalysis negative for infection Pregnancy negative DG abd without bowel obstruction DG chest without infiltrates, cardiomegaly, pneumothorax, pulmonary edema. EKG without ST/T changes  Patient reassessed. Still nauseous however patient just received Zofran. Will plan to reassess after time to work. D-dimer elevated will order CTA chest to assess for PE as cause of her SOB. No DVT on exam. Patient was able to ambulate in room with oxygen at 93% on RA per Nursing. Do not feel patient needs repeat Abd CT currently/  Care transferred to [facility], PA-C who will follow up on remaining labs, CTA chest. If negative and abd pain improved, plan to PO challenge. If unable to pass PO challenge possible admit for intractable N/V/D with COVID         Susan Hogan was evaluated in Emergency Department on 08/19/2019 for the symptoms described in the history of present illness. She was evaluated in the context of the global COVID-19 pandemic, which necessitated consideration that the patient might be at risk for infection with the SARS-CoV-2 virus that causes COVID-19. Institutional protocols and algorithms that pertain to the evaluation of patients at risk for COVID-19 are in a state of rapid change based on information released by regulatory bodies including the CDC and federal and state organizations. These policies and algorithms were followed during  the patient's care in the ED. Final Clinical Impressions(s) / ED Diagnoses   Final diagnoses:  COVID-19  Generalized abdominal pain  SOB (shortness of breath)  Nausea vomiting and diarrhea    ED Discharge Orders    None       Evvie Behrmann A, PA-C 08/19/19 2127    Daleen Bo, MD 08/21/19 1108

## 2019-08-19 NOTE — ED Provider Notes (Signed)
Mayrani Earnest ConroyFlores is a 58 y.o. female, presenting to the ED with abdominal pain and shortness of breath.  Upon my evaluation of the patient, she stated her shortness of breath had resolved.  She was no longer having abdominal pain.  Her nausea significantly improved and she had not had any vomiting during her ED course.   [facility], PA-C: "Jordi Earnest ConroyFlores is a 58 y.o. female with Covid positive, COPD, hepatitis C, diabetes presents for evaluation of abdominal pain and shortness of breath.  Found that yesterday she was Covid positive.  Had symptoms over the last 6 days.  Was seen in emergency department on the 23rd for similar symptoms. Prior ED visit patient had CT scan showed patchy bilateral lung bases consistent with bronchopneumonia, nonspecific perinephric stranding.  Also diagnosed with UTI, urine culture grew greater than 100,000 E. Coli. Given abd of Levaquin at DC.  Completed full course of antibiotics.  Patient admits to gradual onset headache, shortness of breath, intermittent pleuritic chest pain, body aches and pains, abdominal cramping, malodorous urine, diarrhea as well as nausea and vomiting.  Patient admits to episodes of NBNB emesis.  Denies fever, chills, sudden onset thunderclap headache, dizziness, lightheadedness, neck pain, neck stiffness exertional chest pain, congestion, rhinorrhea, hemoptysis, lateral weakness, facial asymmetry.  Denies additional aggravating or alleviating factors.  Rates her current pain a 9/10."  Past Medical History:  Diagnosis Date  . Anemia   . Arthritis    RA "states does not see Rheumatologist"  . Asthma   . Carpal tunnel syndrome    bilaterally  . Complication of anesthesia    difficulty breathing  . COPD (chronic obstructive pulmonary disease) (HCC)   . Degenerative disk disease    back  . Depression   . Diabetes mellitus   . Fibromyalgia   . GERD (gastroesophageal reflux disease)   . Headache(784.0)    migraines  . Heart murmur    . Hepatitis    In followup treatment for hepatitis C  . Hypertension    no medication for at this time, sees Dr. Estevan Oaksewight Williams Pcp  . Hypotensive episode    hx of  . Irritable bowel syndrome   . Kidney stone    with hematuria  . Memory loss   . Recurrent upper respiratory infection (URI)   . Shortness of breath   . Syncope     Physical Exam  BP (!) 154/95 (BP Location: Right Arm)   Pulse 63   Temp 99.8 F (37.7 C)   Resp 18   Ht 4\' 11"  (1.499 m)   Wt 102.5 kg   LMP  (LMP Unknown)   SpO2 99%   BMI 45.65 kg/m   Physical Exam Vitals signs and nursing note reviewed.  Constitutional:      General: She is not in acute distress.    Appearance: She is well-developed. She is not diaphoretic.  HENT:     Head: Normocephalic and atraumatic.     Mouth/Throat:     Mouth: Mucous membranes are moist.     Pharynx: Oropharynx is clear.  Eyes:     Conjunctiva/sclera: Conjunctivae normal.  Neck:     Musculoskeletal: Neck supple.  Cardiovascular:     Rate and Rhythm: Normal rate and regular rhythm.     Pulses: Normal pulses.     Heart sounds: Normal heart sounds.  Pulmonary:     Effort: Pulmonary effort is normal. No respiratory distress.     Breath sounds: Normal breath sounds.  Comments: No increased work of breathing.  Speaks in full sentences without difficulty. Abdominal:     Palpations: Abdomen is soft.     Tenderness: There is no abdominal tenderness. There is no guarding.  Musculoskeletal:     Right lower leg: No edema.     Left lower leg: No edema.  Skin:    General: Skin is warm and dry.  Neurological:     Mental Status: She is alert.  Psychiatric:        Mood and Affect: Mood and affect normal.        Speech: Speech normal.        Behavior: Behavior normal.     ED Course/Procedures     Procedures   Abnormal Labs Reviewed  COMPREHENSIVE METABOLIC PANEL - Abnormal; Notable for the following components:      Result Value   Total Protein 5.9 (*)     Albumin 3.2 (*)    AST 74 (*)    ALT 69 (*)    All other components within normal limits  D-DIMER, QUANTITATIVE (NOT AT [facility]) - Abnormal; Notable for the following components:   D-Dimer, Quant 1.10 (*)    All other components within normal limits  LACTATE DEHYDROGENASE - Abnormal; Notable for the following components:   LDH 269 (*)    All other components within normal limits  FERRITIN - Abnormal; Notable for the following components:   Ferritin 360 (*)    All other components within normal limits  URINALYSIS, ROUTINE W REFLEX MICROSCOPIC - Abnormal; Notable for the following components:   APPearance HAZY (*)    Hgb urine dipstick MODERATE (*)    Protein, ur 100 (*)    Bacteria, UA RARE (*)    All other components within normal limits   Ct Angio Chest Pe W And/or Wo Contrast  Result Date: 08/19/2019 CLINICAL DATA:  58 year old female with shortness of breath. EXAM: CT ANGIOGRAPHY CHEST WITH CONTRAST TECHNIQUE: Multidetector CT imaging of the chest was performed using the standard protocol during bolus administration of intravenous contrast. Multiplanar CT image reconstructions and MIPs were obtained to evaluate the vascular anatomy. CONTRAST:  75mL OMNIPAQUE IOHEXOL 350 MG/ML SOLN COMPARISON:  CT dated 10/12/2016 and chest radiograph dated 08/19/2019. FINDINGS: Cardiovascular: There is mild cardiomegaly. No pericardial effusion. The thoracic aorta is unremarkable as visualized. No pulmonary artery embolus identified. Mediastinum/Nodes: There is no hilar or mediastinal adenopathy. The esophagus and the thyroid gland are grossly unremarkable. No mediastinal fluid collection. Lungs/Pleura: Diffuse vascular prominence consistent with congestive changes. Mild interstitial and interlobular septal thickening consistent with edema. Superimposed pneumonia is not excluded. Clinical correlation is recommended. No focal consolidation, pleural effusion, or pneumothorax. The central airways are patent.  Upper Abdomen: Cholecystectomy. Probable fatty infiltration of the liver. Musculoskeletal: No chest wall abnormality. No acute or significant osseous findings. Review of the MIP images confirms the above findings. IMPRESSION: 1. No CT evidence of pulmonary artery embolus. 2. Cardiomegaly with mild congestive changes. Superimposed pneumonia is not excluded. Clinical correlation is recommended. Electronically Signed   By: Elgie CollardArash  Radparvar M.D.   On: 08/19/2019 22:17   Ct Abdomen Pelvis W Contrast  Result Date: 08/14/2019 CLINICAL DATA:  58 year old female with abdominal pain, distention, nausea vomiting. Negative for COVID-19 today. EXAM: CT ABDOMEN AND PELVIS WITH CONTRAST TECHNIQUE: Multidetector CT imaging of the abdomen and pelvis was performed using the standard protocol following bolus administration of intravenous contrast. CONTRAST:  100mL OMNIPAQUE IOHEXOL 300 MG/ML  SOLN COMPARISON:  CT Abdomen  and Pelvis 04/10/2018. FINDINGS: Lower chest: Lower lung volumes. Dependent and peribronchial opacity in the lower lobes and lingula. Some of this resembles atelectasis, although there is scattered peribronchial solid and sub solid opacity, including along the right minor fissure more compatible with multifocal bronchopneumonia. No pleural effusion. No cardiomegaly or pericardial effusion. Hepatobiliary: Surgically absent gallbladder as before. Stable bile duct size. Mildly heterogeneous liver enhancement appears stable and might reflect chronic steatosis. No discrete liver lesion. Pancreas: Negative. Spleen: Negative. Adrenals/Urinary Tract: Normal adrenal glands. Bilateral renal enhancement is within normal limits. There is a paucity of contrast excretion on the delayed images, but the visible excretion appears symmetric. Minimal nonspecific perinephric stranding. No hydronephrosis. Proximal ureters are decompressed. No nephrolithiasis. Unremarkable urinary bladder. Chronic pelvic phleboliths. Stomach/Bowel:  Decompressed and negative rectosigmoid colon. Redundant sigmoid. Negative left colon. Nondilated transverse and right colon with air-fluid levels. No large bowel inflammation identified. Normal appendix (series 3, images 70 and 71). Negative terminal ileum. Nondilated small bowel. Stomach is decompressed. No free air. No free fluid. Vascular/Lymphatic: Major arterial structures are patent with no atherosclerosis identified in the abdomen or pelvis. Portal venous system appears to be patent. No lymphadenopathy. Reproductive: Mildly increased from 2019 round low-density left ovarian cystic lesion, with a small area of suspected internal fat density now on series 3, image 75. This is now about 2.8 centimeters diameter. Negative uterus and right ovary. Other: No pelvic free fluid. Musculoskeletal: No acute osseous abnormality identified. Lower lumbar facet arthropathy. IMPRESSION: 1. Patchy bilateral lung base opacity more resembles bilateral bronchopneumonia than atelectasis in some areas. No pleural effusion. 2. Mild nonspecific perinephric stranding with a paucity of contrast excretion on delayed images. Query acute renal insufficiency. 3. No other acute or inflammatory process identified in the abdomen or pelvis. Normal appendix. 4. Appearance suspicious for chronic and slowly enlarging Left Ovarian Dermoid. 2.8 cm. Recommend GYN follow-up. Electronically Signed   By: Genevie Ann M.D.   On: 08/14/2019 06:52   Dg Chest Port 1 View  Result Date: 08/19/2019 CLINICAL DATA:  58 year old female with history of shortness of breath. Positive COVID-19 test. EXAM: PORTABLE CHEST 1 VIEW COMPARISON:  Chest x-ray 10/27/2018. FINDINGS: Lung volumes are normal. No consolidative airspace disease. No pleural effusions. No pneumothorax. No pulmonary nodule or mass noted. Pulmonary vasculature and the cardiomediastinal silhouette are within normal limits. IMPRESSION: No radiographic evidence of acute cardiopulmonary disease.  Electronically Signed   By: Vinnie Langton M.D.   On: 08/19/2019 19:01   Dg Abd Portable 1 View  Result Date: 08/19/2019 CLINICAL DATA:  58 year old female with abdominal pain. EXAM: PORTABLE ABDOMEN - 1 VIEW COMPARISON:  CT of the abdomen pelvis dated 08/14/2019. FINDINGS: There is no bowel dilatation or evidence of obstruction. No free air or radiopaque calculi. Right upper quadrant cholecystectomy clips. The osseous structures and soft tissues are grossly unremarkable. IMPRESSION: No evidence of bowel obstruction. Electronically Signed   By: Anner Crete M.D.   On: 08/19/2019 20:14    EKG Interpretation  Date/Time:  Saturday August 19 2019 19:08:52 EST Ventricular Rate:  53 PR Interval:    QRS Duration: 95 QT Interval:  436 QTC Calculation: 410 R Axis:   8 Text Interpretation: Sinus rhythm Borderline T abnormalities, inferior leads since last tracing no significant change Confirmed by Daleen Bo [phone]) on 08/19/2019 8:56:58 PM      MDM   Patient care handoff report received from [facility], PA-C. Plan: CT PE study pending.  Assure she is able to tolerate PO.  Patient presents with shortness of breath, abdominal pain, and vomiting. Patient is nontoxic appearing, afebrile, not tachycardic, not tachypneic, not hypotensive, maintains excellent SPO2 on room air, and is in no apparent distress.  No evidence of PE on CT scan today.  Patient was able to tolerate PO prior to discharge. The patient was given instructions for home care as well as return precautions. Patient voices understanding of these instructions, accepts the plan, and is comfortable with discharge.  Vitals:   08/19/19 2300 08/19/19 2315 08/19/19 2330 08/19/19 2345  BP: (!) 151/58 (!) 144/75 (!) 149/54 132/74  Pulse: (!) 51 (!) 46 (!) 52 (!) 58  Resp: 17 17 17 20   Temp:      SpO2: 98% 94% 95% 96%  Weight:      Height:          , PA-C 08/20/19 0150    08/22/19, MD 08/20/19  [id number]

## 2019-08-19 NOTE — ED Notes (Signed)
Pt tolerated water without any difficulty.  Pt to call son to pick her up for discharge

## 2019-08-20 NOTE — ED Notes (Signed)
Patient verbalizes understanding of discharge instructions. Opportunity for questioning and answers were provided. Armband removed by staff, pt discharged from ED.  

## 2019-08-22 LAB — URINE CULTURE: Culture: >=100000 — AB

## 2019-08-23 ENCOUNTER — Telehealth: Payer: Self-pay | Admitting: Emergency Medicine

## 2019-08-23 NOTE — Telephone Encounter (Signed)
Post ED Visit - Positive Culture Follow-up  Culture report reviewed by antimicrobial stewardship pharmacist: [place] Team []  Elenor Quinones, Pharm.D. []  Heide Guile, Pharm.D., BCPS AQ-ID []  Parks Neptune, Pharm.D., BCPS []  Alycia Rossetti, Pharm.D., BCPS []  [place], [place].D., BCPS, AAHIVP []  Legrand Como, Pharm.D., BCPS, AAHIVP []  Salome Arnt, PharmD, BCPS []  Johnnette Gourd, PharmD, BCPS [x]  Hughes Better, PharmD, BCPS []  Leeroy Cha, PharmD []  Laqueta Lind[place], PharmD, BCPS []  Albertina Parr, PharmD  [place] Team []  Leodis Sias, PharmD []  Lindell Spar, PharmD []  Royetta Asal, PharmD []  Graylin Shiver, Rph []  Rema Fendt) Glennon Mac, PharmD []  Arlyn Dunning, PharmD []  Netta Cedars, PharmD []  Dia Sitter, PharmD []  Leone Haven, PharmD []  Gretta Arab, PharmD []  Theodis Shove, PharmD []  Peggyann Juba, PharmD []  Reuel Boom, PharmD   Positive urine culture Treated with levaquin, organism sensitive to the same and no further patient follow-up is required at this time.  Hazle Nordmann 08/23/2019, 1:22 PM

## 2019-08-24 LAB — CULTURE, BLOOD (ROUTINE X 2)
Culture: NO GROWTH
Culture: NO GROWTH
Special Requests: ADEQUATE

## 2019-08-30 ENCOUNTER — Encounter (HOSPITAL_COMMUNITY): Payer: Self-pay

## 2019-08-30 ENCOUNTER — Other Ambulatory Visit: Payer: Self-pay

## 2019-08-30 ENCOUNTER — Emergency Department (HOSPITAL_COMMUNITY)
Admission: EM | Admit: 2019-08-30 | Discharge: 2019-08-30 | Disposition: A | Discharge status: Home / Self Care | Payer: Medicaid Other | Attending: Emergency Medicine | Admitting: Emergency Medicine

## 2019-08-30 DIAGNOSIS — Z79899 Other long term (current) drug therapy: Secondary | ICD-10-CM | POA: Insufficient documentation

## 2019-08-30 DIAGNOSIS — I10 Essential (primary) hypertension: Secondary | ICD-10-CM | POA: Diagnosis present

## 2019-08-30 DIAGNOSIS — J449 Chronic obstructive pulmonary disease, unspecified: Secondary | ICD-10-CM | POA: Diagnosis not present

## 2019-08-30 DIAGNOSIS — M542 Cervicalgia: Secondary | ICD-10-CM | POA: Insufficient documentation

## 2019-08-30 DIAGNOSIS — E119 Type 2 diabetes mellitus without complications: Secondary | ICD-10-CM | POA: Insufficient documentation

## 2019-08-30 MED ORDER — HYDROCODONE-ACETAMINOPHEN 5-325 MG PO TABS
1.0000 | ORAL_TABLET | Freq: Once | ORAL | Status: AC
  Administered 2019-08-30: 1 via ORAL
  Filled 2019-08-30: qty 1

## 2019-08-30 MED ORDER — TRAMADOL HCL 50 MG PO TABS: 50.0000 mg | ORAL_TABLET | Freq: Four times a day (QID) | ORAL | 0 refills | Status: DC | PRN

## 2019-08-30 MED ORDER — LABETALOL HCL 100 MG PO TABS: 100.0000 mg | ORAL_TABLET | Freq: Two times a day (BID) | ORAL | 0 refills | Status: DC

## 2019-08-30 NOTE — ED Triage Notes (Signed)
Pt states that she went to her chiropractor today, who told her immediately to go for eval of hypertension 200/119. Pt states that she frequently has HTN when she is sick.  Pt states that she was going to chiropractor for stiff neck from sleeping wrong.  Pt dx with COVID 08/19/19

## 2019-08-30 NOTE — ED Provider Notes (Signed)
[facility] DEPT Provider Note   CSN: [id number] Arrival date & time: 08/30/19  1442     History   Chief Complaint Chief Complaint  Patient presents with   Hypertension   COVID+    Dx 11/28    HPI Susan Hogan is a 58 y.o. female.     Patient was sent here by her doctor because of high blood pressure.  She was also seen at the chiropractor yesterday because her neck hurts she states she slept on it funny.  Chiropractor checked her blood pressure and it was high and they contacted her primary care doctor who sent her to the emergency department patient has neck pain that set  The history is provided by the patient. No language interpreter was used.  Hypertension This is a recurrent problem. The current episode started 12 to 24 hours ago. The problem occurs constantly. The problem has not changed since onset.Pertinent negatives include no chest pain, no abdominal pain and no headaches. Nothing aggravates the symptoms. Nothing relieves the symptoms. She has tried nothing for the symptoms. The treatment provided no relief.    Past Medical History:  Diagnosis Date   Anemia    Arthritis    RA "states does not see Rheumatologist"   Asthma    Carpal tunnel syndrome    bilaterally   Complication of anesthesia    difficulty breathing   COPD (chronic obstructive pulmonary disease) (HCC)    Degenerative disk disease    back   Depression    Diabetes mellitus    Fibromyalgia    GERD (gastroesophageal reflux disease)    Headache(784.0)    migraines   Heart murmur    Hepatitis    In followup treatment for hepatitis C   Hypertension    no medication for at this time, sees Dr. Dinah Beers Pcp   Hypotensive episode    hx of   Irritable bowel syndrome    Kidney stone    with hematuria   Memory loss    Recurrent upper respiratory infection (URI)    Shortness of breath    Syncope     Patient Active Problem List   Diagnosis Date Noted   Chronic migraine without aura, with intractable migraine, so stated, with status migrainosus 03/01/2019   Medication overuse headache 03/01/2019   Seasonal allergic rhinitis due to pollen 03/25/2018   Adnexal cyst 03/25/2018   Morbid obesity ([place]) 03/25/2018   Essential hypertension 03/25/2018   Pure hypercholesterolemia 03/25/2018   Sleep-disordered breathing 03/25/2018   Vitamin D deficiency 03/25/2018   Bilateral lower extremity edema 03/25/2018   Acute meniscal tear of left knee 11/24/2016   Chronic hepatitis C without hepatic coma ([place]) 11/24/2016   Fatty liver 11/24/2016   H/O degenerative disc disease 11/24/2016   COPD (chronic obstructive pulmonary disease) ([place]) 10/22/2016   Type 2 diabetes mellitus, cannot tolerate Metformin 07/25/2014   Fibromyalgia 07/25/2014   MDD (major depressive disorder), recurrent severe, without psychosis ([place]) 03/17/2013   Hepatitis C 08/02/2007   IBS (irritable bowel syndrome) 08/02/2007   Mild persistent asthma with acute exacerbation 09/21/1962    Past Surgical History:  Procedure Laterality Date   CHOLECYSTECTOMY  2019   DILATION AND CURETTAGE OF UTERUS     Left knee surgery     LIVER BIOPSY     TONSILLECTOMY     TUBAL LIGATION       OB History   No obstetric history on file.  Home Medications    Prior to Admission medications   Medication Sig Start Date End Date Taking? Authorizing Provider  albuterol (PROVENTIL HFA;VENTOLIN HFA) 108 (90 Base) MCG/ACT inhaler Inhale 1-2 puffs into the lungs every 6 (six) hours as needed for wheezing or shortness of breath. 03/03/18   Couture, Cortni S, PA-C  blood glucose meter kit and supplies KIT Dispense based on patient and insurance preference. Use up to four times daily as directed. (FOR ICD-9 250.00, 250.01). 03/25/18   Briscoe Deutscher, DO  dicyclomine (BENTYL) 20 MG tablet Take 1 tablet (20 mg total) by mouth 2 (two) times daily. 08/19/19    Joy, Shawn C, PA-C  diphenhydrAMINE (BENADRYL ALLERGY) 25 mg capsule Take 1 capsule (25 mg total) by mouth every 6 (six) hours as needed. 03/20/19   Melvenia Beam, MD  escitalopram (LEXAPRO) 10 MG tablet TAKE 1 TABLET(10 MG) BY MOUTH DAILY Patient not taking: Reported on 07/05/2018 06/01/18   Briscoe Deutscher, DO  Fremanezumab-vfrm (AJOVY) 225 MG/1.5ML SOSY Inject 675 mg into the skin every 3 (three) months. Used samples today. She is due for next shots 05/23/2019 will try and get approved. 03/09/19   Melvenia Beam, MD  glucose blood (ACCU-CHEK ACTIVE STRIPS) test strip Use as instructed 04/15/16   Nona Dell, PA-C  labetalol (NORMODYNE) 100 MG tablet Take 1 tablet (100 mg total) by mouth 2 (two) times daily. 08/30/19   Milton Ferguson, MD  ondansetron (ZOFRAN ODT) 4 MG disintegrating tablet Take 1 tablet (4 mg total) by mouth every 8 (eight) hours as needed for nausea or vomiting. 08/19/19   Joy, Shawn C, PA-C  ondansetron (ZOFRAN) 4 MG tablet Take 1 tablet (4 mg total) by mouth every 8 (eight) hours as needed for nausea or vomiting. 08/14/19   Caccavale, Sophia, PA-C  prochlorperazine (COMPAZINE) 10 MG tablet Take 1 tablet (10 mg total) by mouth every 6 (six) hours as needed. May take with 78m of benadryl for migraines.. 03/09/19   AMelvenia Beam MD  rizatriptan (MAXALT-MLT) 10 MG disintegrating tablet Take 1 tablet (10 mg total) by mouth as needed for migraine. May repeat in 2 hours if needed. Maximum 2x in one day. 03/16/19   AMelvenia Beam MD  topiramate (TOPAMAX) 100 MG tablet Take 1 tablet (100 mg total) by mouth at bedtime. 03/09/19   AMelvenia Beam MD  traMADol (ULTRAM) 50 MG tablet Take 1 tablet (50 mg total) by mouth every 6 (six) hours as needed. 08/30/19   ZMilton Ferguson MD    Family History Family History  Problem Relation Age of Onset   Heart disease Mother    Diabetes Mother    Asthma Mother    Anemia Mother    Cerebral aneurysm Mother        ruptured    Kidney failure Mother    Migraines Mother    Parkinson's disease Mother    Colon cancer Maternal Uncle    Heart disease Maternal Uncle    Heart disease Maternal Grandmother    Diabetes Maternal Grandmother    Asthma Maternal Grandmother    Heart Problems Maternal Grandmother    Heart Problems Other        through family   Diabetes Granddaughter    Diabetes Daughter     Social History Social History   Tobacco Use   Smoking status: Never Smoker   Smokeless tobacco: Never Used  Substance Use Topics   Alcohol use: Never    Alcohol/week: 0.0 standard drinks  Frequency: Never   Drug use: Never     Allergies   Anesthetic ether [ether], Nubain [nalbuphine hcl], Penicillins, Lisinopril, Advair diskus [fluticasone-salmeterol], and Lactose intolerance (gi)   Review of Systems Review of Systems  Constitutional: Negative for appetite change and fatigue.  HENT: Negative for congestion, ear discharge and sinus pressure.        Neck pain  Eyes: Negative for discharge.  Respiratory: Negative for cough.   Cardiovascular: Negative for chest pain.  Gastrointestinal: Negative for abdominal pain and diarrhea.  Genitourinary: Negative for frequency and hematuria.  Musculoskeletal: Negative for back pain.  Skin: Negative for rash.  Neurological: Negative for seizures and headaches.  Psychiatric/Behavioral: Negative for hallucinations.     Physical Exam Updated Vital Signs BP (!) 164/101 (BP Location: Left Arm)    Pulse 97    Temp 99.3 F (37.4 C) (Oral)    Resp 16    Wt 101.6 kg    LMP  (LMP Unknown)    SpO2 97%    BMI 45.24 kg/m   Physical Exam Vitals signs and nursing note reviewed.  Constitutional:      Appearance: She is well-developed.  HENT:     Head: Normocephalic.     Nose: Nose normal.  Eyes:     General: No scleral icterus.    Conjunctiva/sclera: Conjunctivae normal.  Neck:     Musculoskeletal: Neck supple.     Thyroid: No thyromegaly.    Cardiovascular:     Rate and Rhythm: Normal rate and regular rhythm.     Heart sounds: No murmur. No friction rub. No gallop.   Pulmonary:     Breath sounds: No stridor. No wheezing or rales.  Chest:     Chest wall: No tenderness.  Abdominal:     General: There is no distension.     Tenderness: There is no abdominal tenderness. There is no rebound.  Musculoskeletal: Normal range of motion.     Comments: Moderate tenderness to right side of neck  Lymphadenopathy:     Cervical: No cervical adenopathy.  Skin:    Findings: No erythema or rash.  Neurological:     Mental Status: She is alert and oriented to person, place, and time.     Motor: No abnormal muscle tone.     Coordination: Coordination normal.  Psychiatric:        Behavior: Behavior normal.      ED Treatments / Results  Labs (all labs ordered are listed, but only abnormal results are displayed) Labs Reviewed - No data to display  EKG None  Radiology No results found.  Procedures Procedures (including critical care time)  Medications Ordered in ED Medications - No data to display   Initial Impression / Assessment and Plan / ED Course  I have reviewed the triage vital signs and the nursing notes.  Pertinent labs & imaging results that were available during my care of the patient were reviewed by me and considered in my medical decision making (see chart for details).        Patient with hypertension.  She will be placed on labetalol.  She is also given some Ultram for muscle spasm and neck.  She will follow-up with her PCP  Final Clinical Impressions(s) / ED Diagnoses   Final diagnoses:  Essential hypertension  Neck pain    ED Discharge Orders         Ordered    labetalol (NORMODYNE) 100 MG tablet  2 times daily  08/30/19 2053    traMADol (ULTRAM) 50 MG tablet  Every 6 hours PRN     08/30/19 2053           Milton Ferguson, MD 08/30/19 2057

## 2019-08-30 NOTE — ED Notes (Signed)
Pt waiting for ride to show up.

## 2019-08-30 NOTE — ED Notes (Signed)
Pt asking for pain medication before discharge.

## 2019-08-30 NOTE — ED Notes (Signed)
Pt left with son

## 2019-08-30 NOTE — Discharge Instructions (Addendum)
Start taking blood pressure medicine tomorrow.  Follow-up with your family doctor next week for recheck

## 2019-08-31 ENCOUNTER — Encounter (HOSPITAL_COMMUNITY): Payer: Self-pay | Admitting: Emergency Medicine

## 2019-08-31 ENCOUNTER — Emergency Department (HOSPITAL_COMMUNITY)
Admission: EM | Admit: 2019-08-31 | Discharge: 2019-08-31 | Disposition: A | Discharge status: Home / Self Care | Payer: Medicaid Other | Attending: Emergency Medicine | Admitting: Emergency Medicine

## 2019-08-31 ENCOUNTER — Other Ambulatory Visit: Payer: Self-pay

## 2019-08-31 ENCOUNTER — Emergency Department (HOSPITAL_COMMUNITY): Payer: Medicaid Other

## 2019-08-31 DIAGNOSIS — I10 Essential (primary) hypertension: Secondary | ICD-10-CM | POA: Insufficient documentation

## 2019-08-31 DIAGNOSIS — M542 Cervicalgia: Secondary | ICD-10-CM | POA: Diagnosis present

## 2019-08-31 DIAGNOSIS — Z79899 Other long term (current) drug therapy: Secondary | ICD-10-CM | POA: Insufficient documentation

## 2019-08-31 DIAGNOSIS — R519 Headache, unspecified: Secondary | ICD-10-CM | POA: Insufficient documentation

## 2019-08-31 DIAGNOSIS — R21 Rash and other nonspecific skin eruption: Secondary | ICD-10-CM | POA: Insufficient documentation

## 2019-08-31 DIAGNOSIS — R2243 Localized swelling, mass and lump, lower limb, bilateral: Secondary | ICD-10-CM | POA: Insufficient documentation

## 2019-08-31 DIAGNOSIS — J449 Chronic obstructive pulmonary disease, unspecified: Secondary | ICD-10-CM | POA: Diagnosis not present

## 2019-08-31 DIAGNOSIS — E119 Type 2 diabetes mellitus without complications: Secondary | ICD-10-CM | POA: Insufficient documentation

## 2019-08-31 LAB — CBC WITH DIFFERENTIAL/PLATELET
Abs Immature Granulocytes: 0.04 10*3/uL (ref 0.00–0.07)
Basophils Absolute: 0.1 10*3/uL (ref 0.0–0.1)
Basophils Relative: 1 %
Eosinophils Absolute: 0.1 10*3/uL (ref 0.0–0.5)
Eosinophils Relative: 1 %
HCT: 42 % (ref 36.0–46.0)
Hemoglobin: 14.3 g/dL (ref 12.0–15.0)
Immature Granulocytes: 0 %
Lymphocytes Relative: 22 %
Lymphs Abs: 2.6 10*3/uL (ref 0.7–4.0)
MCH: 33.1 pg (ref 26.0–34.0)
MCHC: 34 g/dL (ref 30.0–36.0)
MCV: 97.2 fL (ref 80.0–100.0)
Monocytes Absolute: 1.1 10*3/uL — ABNORMAL HIGH (ref 0.1–1.0)
Monocytes Relative: 9 %
Neutro Abs: 8.2 10*3/uL — ABNORMAL HIGH (ref 1.7–7.7)
Neutrophils Relative %: 67 %
Platelets: 153 10*3/uL (ref 150–400)
RBC: 4.32 MIL/uL (ref 3.87–5.11)
RDW: 13.2 % (ref 11.5–15.5)
WBC: 12.1 10*3/uL — ABNORMAL HIGH (ref 4.0–10.5)
nRBC: 0 % (ref 0.0–0.2)

## 2019-08-31 LAB — COMPREHENSIVE METABOLIC PANEL
ALT: 85 U/L — ABNORMAL HIGH (ref 0–44)
AST: 85 U/L — ABNORMAL HIGH (ref 15–41)
Albumin: 3.6 g/dL (ref 3.5–5.0)
Alkaline Phosphatase: 73 U/L (ref 38–126)
Anion gap: 11 (ref 5–15)
BUN: 9 mg/dL (ref 6–20)
CO2: 25 mmol/L (ref 22–32)
Calcium: 9.4 mg/dL (ref 8.9–10.3)
Chloride: 103 mmol/L (ref 98–111)
Creatinine, Ser: 0.8 mg/dL (ref 0.44–1.00)
GFR calc Af Amer: >60 mL/min (ref >60)
GFR calc non Af Amer: >60 mL/min (ref >60)
Glucose, Bld: 122 mg/dL — ABNORMAL HIGH (ref 70–99)
Potassium: 3.6 mmol/L (ref 3.5–5.1)
Sodium: 139 mmol/L (ref 135–145)
Total Bilirubin: 0.8 mg/dL (ref 0.3–1.2)
Total Protein: 6.5 g/dL (ref 6.5–8.1)

## 2019-08-31 LAB — CBG MONITORING, ED: Glucose-Capillary: 123 mg/dL — ABNORMAL HIGH (ref 70–99)

## 2019-08-31 LAB — CSF CELL COUNT WITH DIFFERENTIAL
RBC Count, CSF: 216 /mm3 — ABNORMAL HIGH
RBC Count, CSF: 37 /mm3 — ABNORMAL HIGH
Tube #: 1
Tube #: 4
WBC, CSF: 1 /mm3 (ref 0–5)
WBC, CSF: 1 /mm3 (ref 0–5)

## 2019-08-31 LAB — PROTEIN AND GLUCOSE, CSF
Glucose, CSF: 71 mg/dL — ABNORMAL HIGH (ref 40–70)
Total  Protein, CSF: 32 mg/dL (ref 15–45)

## 2019-08-31 MED ORDER — LORAZEPAM 2 MG/ML IJ SOLN
1.0000 mg | Freq: Once | INTRAMUSCULAR | Status: AC
  Administered 2019-08-31: 1 mg via INTRAVENOUS
  Filled 2019-08-31: qty 1

## 2019-08-31 MED ORDER — KETOROLAC TROMETHAMINE 15 MG/ML IJ SOLN
15.0000 mg | Freq: Once | INTRAMUSCULAR | Status: AC
  Administered 2019-08-31: 15 mg via INTRAVENOUS
  Filled 2019-08-31: qty 1

## 2019-08-31 MED ORDER — LIDOCAINE HCL (PF) 1 % IJ SOLN: 5.0000 mL | Freq: Once | INTRAMUSCULAR | Status: DC

## 2019-08-31 MED ORDER — DEXAMETHASONE SODIUM PHOSPHATE 10 MG/ML IJ SOLN
10.0000 mg | Freq: Once | INTRAMUSCULAR | Status: AC
  Administered 2019-08-31: 10 mg via INTRAVENOUS
  Filled 2019-08-31: qty 1

## 2019-08-31 MED ORDER — LACTATED RINGERS IV BOLUS
1000.0000 mL | Freq: Once | INTRAVENOUS | Status: AC
  Administered 2019-08-31: 1000 mL via INTRAVENOUS

## 2019-08-31 MED ORDER — MELOXICAM 15 MG PO TABS: 15.0000 mg | ORAL_TABLET | Freq: Every day | ORAL | 0 refills | Status: DC

## 2019-08-31 NOTE — ED Provider Notes (Signed)
[facility] EMERGENCY DEPARTMENT Provider Note   CSN: [id number] Arrival date & time: 08/31/19  0402     History Chief Complaint  Patient presents with   Bernerd Pho Elisabeth Most Swelling/Headache/Neck pain    Telina Mullane is a 58 y.o. female.  HPI  58 year old female with multiple complaints.  Known Covid positive.  She reports to 3 days ago she developed right-sided neck pain when she woke up.  She initially felt that she slept long.  She tried going to her chiropractor but was referred to the emergency room as he was too hypertensive in the office.  She was evaluated and was felt that her pain was musculoskeletal.  She was discharged with tramadol.  She states that she has had persistent pain.  She wanted to be reevaluated.  She is awaiting she developed swelling and a rash in her bilateral feet/ankles.  Denies any fevers.  No acute respiratory complaints.  No acute visual changes.  Mild headache.  No photophobia.  Past Medical History:  Diagnosis Date   Anemia    Arthritis    RA "states does not see Rheumatologist"   Asthma    Carpal tunnel syndrome    bilaterally   Complication of anesthesia    difficulty breathing   COPD (chronic obstructive pulmonary disease) (HCC)    Degenerative disk disease    back   Depression    Diabetes mellitus    Fibromyalgia    GERD (gastroesophageal reflux disease)    Headache(784.0)    migraines   Heart murmur    Hepatitis    In followup treatment for hepatitis C   Hypertension    no medication for at this time, sees Dr. Dinah Beers Pcp   Hypotensive episode    hx of   Irritable bowel syndrome    Kidney stone    with hematuria   Memory loss    Recurrent upper respiratory infection (URI)    Shortness of breath    Syncope    Patient Active Problem List   Diagnosis Date Noted   Chronic migraine without aura, with intractable migraine, so stated, with status migrainosus 03/01/2019    Medication overuse headache 03/01/2019   Seasonal allergic rhinitis due to pollen 03/25/2018   Adnexal cyst 03/25/2018   Morbid obesity ([place]) 03/25/2018   Essential hypertension 03/25/2018   Pure hypercholesterolemia 03/25/2018   Sleep-disordered breathing 03/25/2018   Vitamin D deficiency 03/25/2018   Bilateral lower extremity edema 03/25/2018   Acute meniscal tear of left knee 11/24/2016   Chronic hepatitis C without hepatic coma ([place]) 11/24/2016   Fatty liver 11/24/2016   H/O degenerative disc disease 11/24/2016   COPD (chronic obstructive pulmonary disease) ([place]) 10/22/2016   Type 2 diabetes mellitus, cannot tolerate Metformin 07/25/2014   Fibromyalgia 07/25/2014   MDD (major depressive disorder), recurrent severe, without psychosis ([place]) 03/17/2013   Hepatitis C 08/02/2007   IBS (irritable bowel syndrome) 08/02/2007   Mild persistent asthma with acute exacerbation 09/21/1962   Past Surgical History:  Procedure Laterality Date   CHOLECYSTECTOMY  2019   DILATION AND CURETTAGE OF UTERUS     Left knee surgery     LIVER BIOPSY     TONSILLECTOMY     TUBAL LIGATION      OB History   No obstetric history on file.    Family History  Problem Relation Age of Onset   Heart disease Mother    Diabetes Mother    Asthma Mother    Anemia  Mother    Cerebral aneurysm Mother        ruptured   Kidney failure Mother    Migraines Mother    Parkinson's disease Mother    Colon cancer Maternal Uncle    Heart disease Maternal Uncle    Heart disease Maternal Grandmother    Diabetes Maternal Grandmother    Asthma Maternal Grandmother    Heart Problems Maternal Grandmother    Heart Problems Other        through family   Diabetes Granddaughter    Diabetes Daughter    Social History   Tobacco Use   Smoking status: Never Smoker   Smokeless tobacco: Never Used  Substance Use Topics   Alcohol use: Never    Alcohol/week: 0.0 standard drinks    Drug use: Never   Home Medications Prior to Admission medications   Medication Sig Start Date End Date Taking? Authorizing Provider  albuterol (PROVENTIL HFA;VENTOLIN HFA) 108 (90 Base) MCG/ACT inhaler Inhale 1-2 puffs into the lungs every 6 (six) hours as needed for wheezing or shortness of breath. 03/03/18   Couture, Cortni S, PA-C  blood glucose meter kit and supplies KIT Dispense based on patient and insurance preference. Use up to four times daily as directed. (FOR ICD-9 250.00, 250.01). 03/25/18   Briscoe Deutscher, DO  dicyclomine (BENTYL) 20 MG tablet Take 1 tablet (20 mg total) by mouth 2 (two) times daily. 08/19/19   Joy, Shawn C, PA-C  diphenhydrAMINE (BENADRYL ALLERGY) 25 mg capsule Take 1 capsule (25 mg total) by mouth every 6 (six) hours as needed. 03/20/19   Melvenia Beam, MD  escitalopram (LEXAPRO) 10 MG tablet TAKE 1 TABLET(10 MG) BY MOUTH DAILY Patient not taking: Reported on 07/05/2018 06/01/18   Briscoe Deutscher, DO  Fremanezumab-vfrm (AJOVY) 225 MG/1.5ML SOSY Inject 675 mg into the skin every 3 (three) months. Used samples today. She is due for next shots 05/23/2019 will try and get approved. 03/09/19   Melvenia Beam, MD  glucose blood (ACCU-CHEK ACTIVE STRIPS) test strip Use as instructed 04/15/16   Nona Dell, PA-C  labetalol (NORMODYNE) 100 MG tablet Take 1 tablet (100 mg total) by mouth 2 (two) times daily. 08/30/19   Milton Ferguson, MD  ondansetron (ZOFRAN ODT) 4 MG disintegrating tablet Take 1 tablet (4 mg total) by mouth every 8 (eight) hours as needed for nausea or vomiting. 08/19/19   Joy, Shawn C, PA-C  ondansetron (ZOFRAN) 4 MG tablet Take 1 tablet (4 mg total) by mouth every 8 (eight) hours as needed for nausea or vomiting. 08/14/19   Caccavale, Sophia, PA-C  prochlorperazine (COMPAZINE) 10 MG tablet Take 1 tablet (10 mg total) by mouth every 6 (six) hours as needed. May take with 50m of benadryl for migraines.. 03/09/19   AMelvenia Beam MD  rizatriptan  (MAXALT-MLT) 10 MG disintegrating tablet Take 1 tablet (10 mg total) by mouth as needed for migraine. May repeat in 2 hours if needed. Maximum 2x in one day. 03/16/19   AMelvenia Beam MD  topiramate (TOPAMAX) 100 MG tablet Take 1 tablet (100 mg total) by mouth at bedtime. 03/09/19   AMelvenia Beam MD  traMADol (ULTRAM) 50 MG tablet Take 1 tablet (50 mg total) by mouth every 6 (six) hours as needed. 08/30/19   ZMilton Ferguson MD    Allergies    Anesthetic ether [ether], Nubain [nalbuphine hcl], Penicillins, Lisinopril, Advair diskus [fluticasone-salmeterol], and Lactose intolerance (gi)  Review of Systems   Review of Systems  All systems reviewed and negative, other than as noted in HPI.  Physical Exam Updated Vital Signs BP 138/70    Pulse 77    Temp 98.8 F (37.1 C) (Oral)    Resp (!) 29    LMP  (LMP Unknown)    SpO2 97%   Physical Exam Vitals and nursing note reviewed.  Constitutional:      General: She is not in acute distress.    Appearance: She is well-developed.  HENT:     Head: Normocephalic and atraumatic.  Eyes:     General:        Right eye: No discharge.        Left eye: No discharge.     Conjunctiva/sclera: Conjunctivae normal.  Neck:     Comments: Mild TTP R lateral neck. No nuchal rigidity.  Cardiovascular:     Rate and Rhythm: Normal rate and regular rhythm.     Heart sounds: Normal heart sounds. No murmur. No friction rub. No gallop.   Pulmonary:     Effort: Pulmonary effort is normal. No respiratory distress.     Breath sounds: Normal breath sounds.  Abdominal:     General: There is no distension.     Palpations: Abdomen is soft.     Tenderness: There is no abdominal tenderness.  Musculoskeletal:        General: No tenderness.     Cervical back: Neck supple. No rigidity.  Lymphadenopathy:     Cervical: No cervical adenopathy.  Skin:    General: Skin is warm and dry.     Findings: Rash present.     Comments: Hard to appreciate from the pictures,  but petechial rash to b/l feet/LE with some scattered purpura. None noted on soles of feet. Mild swelling.   Neurological:     Mental Status: She is alert.  Psychiatric:        Behavior: Behavior normal.        Thought Content: Thought content normal.          ED Results / Procedures / Treatments   Labs (all labs ordered are listed, but only abnormal results are displayed) Labs Reviewed  CBC WITH DIFFERENTIAL/PLATELET - Abnormal; Notable for the following components:      Result Value   WBC 12.1 (*)    Neutro Abs 8.2 (*)    Monocytes Absolute 1.1 (*)    All other components within normal limits  COMPREHENSIVE METABOLIC PANEL - Abnormal; Notable for the following components:   Glucose, Bld 122 (*)    AST 85 (*)    ALT 85 (*)    All other components within normal limits  CSF CELL COUNT WITH DIFFERENTIAL - Abnormal; Notable for the following components:   RBC Count, CSF 216 (*)    All other components within normal limits  CSF CELL COUNT WITH DIFFERENTIAL - Abnormal; Notable for the following components:   RBC Count, CSF 37 (*)    All other components within normal limits  PROTEIN AND GLUCOSE, CSF - Abnormal; Notable for the following components:   Glucose, CSF 71 (*)    All other components within normal limits  CBG MONITORING, ED - Abnormal; Notable for the following components:   Glucose-Capillary 123 (*)    All other components within normal limits  CSF CULTURE  CULTURE, BLOOD (ROUTINE X 2)  CULTURE, BLOOD (ROUTINE X 2)    EKG None  Radiology No results found.  Procedures Procedures (including critical care time)  Medications  Ordered in ED Medications  lactated ringers bolus 1,000 mL (1,000 mLs Intravenous New Bag/Given 08/31/19 0822)  dexamethasone (DECADRON) injection 10 mg (10 mg Intravenous Given 08/31/19 0817)  ketorolac (TORADOL) 15 MG/ML injection 15 mg (15 mg Intravenous Given 08/31/19 0814)  LORazepam (ATIVAN) injection 1 mg (1 mg Intravenous  Given 08/31/19 0902)    ED Course  I have reviewed the triage vital signs and the nursing notes.  Pertinent labs & imaging results that were available during my care of the patient were reviewed by me and considered in my medical decision making (see chart for details).    MDM Rules/Calculators/A&P  58yF with multiple complaints. Neck pain, fever and now developed petechial rash. Probably not meningitis. She doesn't have nuchal rigidity. This neck pain is a new complaint although the pain is more in the R lateral neck. I do not have a clear explanation for her symptoms and now this new petechial/purpural rash though.   9:13 AM Set up to do LP. I could not confidently identify spinous processes though. Didn't attempt. Pt has BMI of 45. Will consult radiology to have done under fluoro. Radiology can do LP but they are doing all procedures on COVID patients at the end of their work day.   Christne Larsen was evaluated in Emergency Department on 09/01/2019 for the symptoms described in the history of present illness. She was evaluated in the context of the global COVID-19 pandemic, which necessitated consideration that the patient might be at risk for infection with the SARS-CoV-2 virus that causes COVID-19. Institutional protocols and algorithms that pertain to the evaluation of patients at risk for COVID-19 are in a state of rapid change based on information released by regulatory bodies including the CDC and federal and state organizations. These policies and algorithms were followed during the patient's care in the ED.    Final Clinical Impression(s) / ED Diagnoses Final diagnoses:  Nonintractable headache, unspecified chronicity pattern, unspecified headache type  Neck pain  Rash    Rx / DC Orders ED Discharge Orders    None       Virgel Manifold, MD 09/01/19 1112

## 2019-08-31 NOTE — ED Notes (Signed)
Pt transported to radiology for LP. 

## 2019-08-31 NOTE — ED Provider Notes (Signed)
Patient signed out to me at 3 PM.  Has Covid.  Has mostly musculoskeletal neck pain.  However get an LP by IR.  Will rule out meningitis although patient appears well.  LP negative for infection.  Patient given reassurance and discharged from the ED in good condition.   Lennice Sites, DO 08/31/19 1655

## 2019-08-31 NOTE — ED Triage Notes (Signed)
Patient reports positive COVID19 test last Nov.28,2020 , presents with multiple complaints: headache , feet swelling and right lateral neck pain onset this week , denies fever or chills, seen at [facility] ER yesterday , respirations unlabored.

## 2019-08-31 NOTE — Procedures (Signed)
Fluoroscopic Lumbar Puncture Procedure Note  Pre-operative Diagnosis: Headache, neck pain  Post-operative Diagnosis: Headache, neck pain  Indications: Diagnostic  Procedure Details   Consent: Informed consent was obtained. Risks of the procedure were discussed including: infection, bleeding, pain and headache.  The patient was positioned under sterile conditions. Betadine solution and sterile drapes were utilized. A spinal needle was inserted at the L2 - L3 interspace.  Spinal fluid was obtained and sent to the laboratory.  Findings 11.5 mL of clear spinal fluid was obtained.  Complications:  None; patient tolerated the procedure well.        Condition: stable  Blood Loss: <41mL  Refer to dictation in PACS

## 2019-09-01 ENCOUNTER — Other Ambulatory Visit: Payer: Self-pay

## 2019-09-01 DIAGNOSIS — Z20822 Contact with and (suspected) exposure to covid-19: Secondary | ICD-10-CM

## 2019-09-03 ENCOUNTER — Telehealth: Payer: Self-pay

## 2019-09-03 LAB — CSF CULTURE W GRAM STAIN
Culture: NO GROWTH
Special Requests: NORMAL

## 2019-09-03 LAB — NOVEL CORONAVIRUS, NAA: SARS-CoV-2, NAA: NOT DETECTED

## 2019-09-03 NOTE — Telephone Encounter (Signed)
Pt called for covid results- pt advised results are not back yet.

## 2019-09-05 LAB — CULTURE, BLOOD (ROUTINE X 2)
Culture: NO GROWTH
Culture: NO GROWTH
Special Requests: ADEQUATE

## 2019-09-07 ENCOUNTER — Encounter (HOSPITAL_COMMUNITY): Payer: Self-pay

## 2019-09-07 ENCOUNTER — Emergency Department (HOSPITAL_COMMUNITY)
Admission: EM | Admit: 2019-09-07 | Discharge: 2019-09-07 | Disposition: A | Discharge status: Home / Self Care | Payer: Medicaid Other | Attending: Emergency Medicine | Admitting: Emergency Medicine

## 2019-09-07 ENCOUNTER — Other Ambulatory Visit: Payer: Self-pay

## 2019-09-07 DIAGNOSIS — G44229 Chronic tension-type headache, not intractable: Secondary | ICD-10-CM | POA: Insufficient documentation

## 2019-09-07 DIAGNOSIS — Z79899 Other long term (current) drug therapy: Secondary | ICD-10-CM | POA: Diagnosis not present

## 2019-09-07 DIAGNOSIS — E119 Type 2 diabetes mellitus without complications: Secondary | ICD-10-CM | POA: Diagnosis not present

## 2019-09-07 DIAGNOSIS — J45909 Unspecified asthma, uncomplicated: Secondary | ICD-10-CM | POA: Diagnosis not present

## 2019-09-07 DIAGNOSIS — J449 Chronic obstructive pulmonary disease, unspecified: Secondary | ICD-10-CM | POA: Insufficient documentation

## 2019-09-07 DIAGNOSIS — I10 Essential (primary) hypertension: Secondary | ICD-10-CM | POA: Insufficient documentation

## 2019-09-07 DIAGNOSIS — R519 Headache, unspecified: Secondary | ICD-10-CM | POA: Diagnosis present

## 2019-09-07 NOTE — ED Triage Notes (Signed)
Pt arrives POV for eval of int HA and dizziness since having lumbar surgery on 12/17. Pt reports she has been trying home compazine and rizatriptan as prescribed by PCP w/ no relief. Neuro intact aside from dizziness.

## 2019-09-07 NOTE — ED Notes (Signed)
Headache after a  Lp[ dec 12th

## 2019-09-07 NOTE — ED Provider Notes (Signed)
[facility]PART[facility] Provider Note   CSN: [id number] Arrival date & time: 09/07/19  0025     History Chief Complaint  Patient presents with  . Headache    Susan Hogan is a 58 y.o. female with past medical history of COPD, Fibromyalgia, hypertension, diagnosed with COVID-19 on 08/14/2019 who presents today for evaluation of headache.  She has chronic migraines.  She was seen in the emergency room on 08/30/2019 and on 08/31/19/2020 for neck pain and headache.  At that time she underwent LP without evidence of infection.    She had originally presented to a chiropractor for this, however she tells me she did not undergo any chiropractic adjustments. She reports that her headache has been present since she was originally diagnosed with coronavirus.  She has been trying her previously prescribed Compazine without significant relief.  She states that her headache did not significantly change after the LP.    HPI     Past Medical History:  Diagnosis Date  . Anemia   . Arthritis    RA "states does not see Rheumatologist"  . Asthma   . Carpal tunnel syndrome    bilaterally  . Complication of anesthesia    difficulty breathing  . COPD (chronic obstructive pulmonary disease) ([place])   . Degenerative disk disease    back  . Depression   . Diabetes mellitus   . Fibromyalgia   . GERD (gastroesophageal reflux disease)   . Headache(784.0)    migraines  . Heart murmur   . Hepatitis    In followup treatment for hepatitis C  . Hypertension    no medication for at this time, sees Dr. Dinah Beers Pcp  . Hypotensive episode    hx of  . Irritable bowel syndrome   . Kidney stone    with hematuria  . Memory loss   . Recurrent upper respiratory infection (URI)   . Shortness of breath   . Syncope     Patient Active Problem List   Diagnosis Date Noted  . Chronic migraine without aura, with intractable migraine, so stated, with status migrainosus  03/01/2019  . Medication overuse headache 03/01/2019  . Seasonal allergic rhinitis due to pollen 03/25/2018  . Adnexal cyst 03/25/2018  . Morbid obesity ([place]) 03/25/2018  . Essential hypertension 03/25/2018  . Pure hypercholesterolemia 03/25/2018  . Sleep-disordered breathing 03/25/2018  . Vitamin D deficiency 03/25/2018  . Bilateral lower extremity edema 03/25/2018  . Acute meniscal tear of left knee 11/24/2016  . Chronic hepatitis C without hepatic coma ([place]) 11/24/2016  . Fatty liver 11/24/2016  . H/O degenerative disc disease 11/24/2016  . COPD (chronic obstructive pulmonary disease) ([place]) 10/22/2016  . Type 2 diabetes mellitus, cannot tolerate Metformin 07/25/2014  . Fibromyalgia 07/25/2014  . MDD (major depressive disorder), recurrent severe, without psychosis ([place]) 03/17/2013  . Hepatitis C 08/02/2007  . IBS (irritable bowel syndrome) 08/02/2007  . Mild persistent asthma with acute exacerbation 09/21/1962    Past Surgical History:  Procedure Laterality Date  . CHOLECYSTECTOMY  2019  . DILATION AND CURETTAGE OF UTERUS    . Left knee surgery    . LIVER BIOPSY    . TONSILLECTOMY    . TUBAL LIGATION       OB History   No obstetric history on file.     Family History  Problem Relation Age of Onset  . Heart disease Mother   . Diabetes Mother   . Asthma Mother   . Anemia  Mother   . Cerebral aneurysm Mother        ruptured  . Kidney failure Mother   . Migraines Mother   . Parkinson's disease Mother   . Colon cancer Maternal Uncle   . Heart disease Maternal Uncle   . Heart disease Maternal Grandmother   . Diabetes Maternal Grandmother   . Asthma Maternal Grandmother   . Heart Problems Maternal Grandmother   . Heart Problems Other        through family  . Diabetes Granddaughter   . Diabetes Daughter     Social History   Tobacco Use  . Smoking status: Never Smoker  . Smokeless tobacco: Never Used  Substance Use Topics  . Alcohol use: Never     Alcohol/week: 0.0 standard drinks  . Drug use: Never    Home Medications Prior to Admission medications   Medication Sig Start Date End Date Taking? Authorizing Provider  albuterol (PROVENTIL HFA;VENTOLIN HFA) 108 (90 Base) MCG/ACT inhaler Inhale 1-2 puffs into the lungs every 6 (six) hours as needed for wheezing or shortness of breath. 03/03/18   Couture, Cortni S, PA-C  blood glucose meter kit and supplies KIT Dispense based on patient and insurance preference. Use up to four times daily as directed. (FOR ICD-9 250.00, 250.01). 03/25/18   Briscoe Deutscher, DO  dicyclomine (BENTYL) 20 MG tablet Take 1 tablet (20 mg total) by mouth 2 (two) times daily. 08/19/19   Joy, Shawn C, PA-C  diphenhydrAMINE (BENADRYL ALLERGY) 25 mg capsule Take 1 capsule (25 mg total) by mouth every 6 (six) hours as needed. 03/20/19   Melvenia Beam, MD  escitalopram (LEXAPRO) 10 MG tablet TAKE 1 TABLET(10 MG) BY MOUTH DAILY Patient not taking: Reported on 07/05/2018 06/01/18   Briscoe Deutscher, DO  Fremanezumab-vfrm (AJOVY) 225 MG/1.5ML SOSY Inject 675 mg into the skin every 3 (three) months. Used samples today. She is due for next shots 05/23/2019 will try and get approved. 03/09/19   Melvenia Beam, MD  glucose blood (ACCU-CHEK ACTIVE STRIPS) test strip Use as instructed 04/15/16   Nona Dell, PA-C  labetalol (NORMODYNE) 100 MG tablet Take 1 tablet (100 mg total) by mouth 2 (two) times daily. 08/30/19   Milton Ferguson, MD  meloxicam (MOBIC) 15 MG tablet Take 1 tablet (15 mg total) by mouth daily. 08/31/19   Virgel Manifold, MD  ondansetron (ZOFRAN ODT) 4 MG disintegrating tablet Take 1 tablet (4 mg total) by mouth every 8 (eight) hours as needed for nausea or vomiting. 08/19/19   Joy, Shawn C, PA-C  ondansetron (ZOFRAN) 4 MG tablet Take 1 tablet (4 mg total) by mouth every 8 (eight) hours as needed for nausea or vomiting. 08/14/19   Caccavale, Sophia, PA-C  prochlorperazine (COMPAZINE) 10 MG tablet Take 1 tablet (10  mg total) by mouth every 6 (six) hours as needed. May take with 12m of benadryl for migraines.. 03/09/19   AMelvenia Beam MD  rizatriptan (MAXALT-MLT) 10 MG disintegrating tablet Take 1 tablet (10 mg total) by mouth as needed for migraine. May repeat in 2 hours if needed. Maximum 2x in one day. 03/16/19   AMelvenia Beam MD  topiramate (TOPAMAX) 100 MG tablet Take 1 tablet (100 mg total) by mouth at bedtime. 03/09/19   AMelvenia Beam MD  traMADol (ULTRAM) 50 MG tablet Take 1 tablet (50 mg total) by mouth every 6 (six) hours as needed. 08/30/19   ZMilton Ferguson MD    Allergies    Anesthetic  ether [ether], Nubain [nalbuphine hcl], Penicillins, Lisinopril, Advair diskus [fluticasone-salmeterol], and Lactose intolerance (gi)  Review of Systems   Review of Systems  Constitutional: Negative for chills and fever.  Respiratory: Negative for chest tightness and shortness of breath.   Cardiovascular: Negative for chest pain.  Gastrointestinal: Negative for abdominal pain, diarrhea, nausea and vomiting.  Genitourinary: Negative for dysuria.  Musculoskeletal: Negative for back pain and neck pain.  Skin: Negative for color change and rash.  Neurological: Positive for dizziness and headaches.  Psychiatric/Behavioral: Negative for confusion.  All other systems reviewed and are negative.   Physical Exam Updated Vital Signs BP (!) 146/79 (BP Location: Right Arm)   Pulse 75   Temp 98.5 F (36.9 C) (Oral)   Resp 20   Ht 4' 11" (1.499 m)   Wt 101.6 kg   LMP  (LMP Unknown)   SpO2 99%   BMI 45.24 kg/m   Physical Exam Vitals and nursing note reviewed.  Constitutional:      General: She is not in acute distress.    Appearance: She is well-developed. She is not diaphoretic.  HENT:     Head: Normocephalic and atraumatic.     Comments: Palpation of vertex/head in general recreates and exacerbates her reported headache. Eyes:     General: No scleral icterus.       Right eye: No discharge.         Left eye: No discharge.     Conjunctiva/sclera: Conjunctivae normal.  Cardiovascular:     Rate and Rhythm: Normal rate and regular rhythm.  Pulmonary:     Effort: Pulmonary effort is normal. No respiratory distress.     Breath sounds: No stridor.  Abdominal:     General: There is no distension.  Musculoskeletal:        General: No swelling, tenderness or deformity. Normal range of motion.     Cervical back: Normal range of motion.  Skin:    General: Skin is warm and dry.  Neurological:     Mental Status: She is alert and oriented to person, place, and time.     GCS: GCS eye subscore is 4. GCS verbal subscore is 5. GCS motor subscore is 6.     Cranial Nerves: No cranial nerve deficit.     Sensory: No sensory deficit.     Motor: No weakness or abnormal muscle tone.     Gait: Gait normal.     Comments: Mental Status:  Alert, oriented, thought content appropriate, able to give a coherent history. Speech fluent without evidence of aphasia. Able to follow 2 step commands without difficulty.  Cranial Nerves:  II:  Peripheral visual fields grossly normal, pupils equal, round, reactive to light III,IV, VI: ptosis not present, extra-ocular motions intact bilaterally  V,VII: smile symmetric, facial light touch sensation equal VIII: hearing grossly normal to voice  X: uvula elevates symmetrically  XI: bilateral shoulder shrug symmetric and strong XII: midline tongue extension without fassiculations Motor:  Normal tone. 5/5 in upper and lower extremities bilaterally including strong and equal grip strength and dorsiflexion/plantar flexion Cerebellar: normal finger-to-nose with bilateral upper extremities Gait: normal gait and balance CV: distal pulses palpable throughout    Psychiatric:        Mood and Affect: Mood normal. Mood is not anxious.        Behavior: Behavior normal.     ED Results / Procedures / Treatments   Labs (all labs ordered are listed, but only abnormal results  are displayed)  Labs Reviewed - No data to display  EKG None  Radiology No results found.  Procedures Procedures (including critical care time)  Medications Ordered in ED Medications - No data to display  ED Course  I have reviewed the triage vital signs and the nursing notes.  Pertinent labs & imaging results that were available during my care of the patient were reviewed by me and considered in my medical decision making (see chart for details).    MDM Rules/Calculators/A&P                      Patient presents today for evaluation of headache.  She recently underwent LP for headache and neck with out evidence of infection.  Her headache initially started when she was diagnosed with coronavirus approximately 3 weeks ago and has not significantly changed since, including not after LP.  On exam she is generally well-appearing.  Her headache is both recreated and exacerbated with palpation over the head primarily along the vertex.  She denies any trauma.  She is neurologically intact on exam.  Given that her headache has been constant and unchanged over the 3 weeks since she had covid I suspect this is residual musculoskeletal spasm/covid related headache.  She had not gotten any chiropractic adjustments prior to the onset of her symptoms.    I recommended she follow up with her PCP and neurologist.    Given her headache has been present for 3 weeks, with her history of migraines and LP while having this headache I do not suspect an emergent cause for her symptoms.  Do not suspect CSF leak given her HA has not changed significantly since LP.   Return precautions were discussed with patient who states their understanding.  At the time of discharge patient denied any unaddressed complaints or concerns.  Patient is agreeable for discharge home.     Final Clinical Impression(s) / ED Diagnoses Final diagnoses:  Chronic tension-type headache, not intractable    Rx / DC Orders ED  Discharge Orders    None       Ollen Gross 09/07/19 1884    Palumbo, April, MD 09/07/19 0630

## 2019-09-24 ENCOUNTER — Encounter (HOSPITAL_COMMUNITY): Payer: Self-pay | Admitting: Emergency Medicine

## 2019-09-24 ENCOUNTER — Emergency Department (HOSPITAL_COMMUNITY)
Admission: EM | Admit: 2019-09-24 | Discharge: 2019-09-24 | Disposition: A | Discharge status: Home / Self Care | Payer: Medicaid Other | Attending: Emergency Medicine | Admitting: Emergency Medicine

## 2019-09-24 ENCOUNTER — Other Ambulatory Visit: Payer: Self-pay

## 2019-09-24 DIAGNOSIS — I10 Essential (primary) hypertension: Secondary | ICD-10-CM | POA: Diagnosis not present

## 2019-09-24 DIAGNOSIS — E119 Type 2 diabetes mellitus without complications: Secondary | ICD-10-CM | POA: Diagnosis not present

## 2019-09-24 DIAGNOSIS — J4530 Mild persistent asthma, uncomplicated: Secondary | ICD-10-CM | POA: Diagnosis not present

## 2019-09-24 DIAGNOSIS — Z79899 Other long term (current) drug therapy: Secondary | ICD-10-CM | POA: Diagnosis not present

## 2019-09-24 DIAGNOSIS — J449 Chronic obstructive pulmonary disease, unspecified: Secondary | ICD-10-CM | POA: Diagnosis not present

## 2019-09-24 DIAGNOSIS — H9203 Otalgia, bilateral: Secondary | ICD-10-CM

## 2019-09-24 LAB — CBG MONITORING, ED: Glucose-Capillary: 131 mg/dL — ABNORMAL HIGH (ref 70–99)

## 2019-09-24 MED ORDER — CETIRIZINE HCL 10 MG PO TABS: 10.0000 mg | ORAL_TABLET | Freq: Every day | ORAL | 0 refills | Status: DC

## 2019-09-24 MED ORDER — FLUTICASONE PROPIONATE 50 MCG/ACT NA SUSP: 1.0000 | Freq: Every day | NASAL | 0 refills | Status: DC

## 2019-09-24 MED ORDER — DIPHENHYDRAMINE HCL 25 MG PO TABS: 25.0000 mg | ORAL_TABLET | Freq: Four times a day (QID) | ORAL | 0 refills | Status: DC | PRN

## 2019-09-24 NOTE — ED Triage Notes (Signed)
Pt to triage via [facility] for bilateral ear pain x 2 weeks.  States she feels cold and shaky.

## 2019-09-24 NOTE — Discharge Instructions (Addendum)
You were seen in the emergency department today for ear pain.  Your exam was overall reassuring.  There may be a degree of discomfort in your ears due to some nasal congestion/allergies.  We would like you to continue to use Flonase 1 spray per nostril daily as needed as well as newly prescribed Benadryl every 6 hours as needed for pain & to help with sleep. Do not drive or operate heavy machinery when taking benadryl as it can make you sleepy.   We have prescribed you new medication(s) today. Discuss the medications prescribed today with your pharmacist as they can have adverse effects and interactions with your other medicines including over the counter and prescribed medications. Seek medical evaluation if you start to experience new or abnormal symptoms after taking one of these medicines, seek care immediately if you start to experience difficulty breathing, feeling of your throat closing, facial swelling, or rash as these could be indications of a more serious allergic reaction  We would like you to follow-up with your initially scheduled ear nose and throat doctor, we have also given you an alternative ear nose and throat doctor locally.  You may also follow-up with your primary care provider.  Please return to the emergency department for new or worsening symptoms or any other concerns.

## 2019-09-24 NOTE — ED Provider Notes (Addendum)
[facility] EMERGENCY DEPARTMENT Provider Note   CSN: [id number] Arrival date & time: 09/24/19  0859     History Chief Complaint  Patient presents with  . Otalgia    Susan Hogan is a 59 y.o. female with a hx of COPD, anemia, DM, GERD, fibromyalgia, hypertension, chronic hepatitis C, & hypercholesterolemia who presents to the ED with complaints of ear pain for the past 1 month. Patient states she has pain in the bilateral ears that feels like pressure with popping at times. Discomfort is constant, no alleviating/aggravating factors. She has had some chills with shakiness when she is cold as well as some throat irritation as well. She has seen her PCP for this problem as well as a headache at that time and believes they gave her steroids without much relief. She also had seen UC who prescribed her some kind of ear drop, but she was unable to get this filled due to needing pre-approval. She has tried nasacort without relief. She made an appointment with an ENT but missed this. Denies fever, congestion, ear drainage, cough, dyspnea, or abdominal pain. Denies current headache.   HPI     Past Medical History:  Diagnosis Date  . Anemia   . Arthritis    RA "states does not see Rheumatologist"  . Asthma   . Carpal tunnel syndrome    bilaterally  . Complication of anesthesia    difficulty breathing  . COPD (chronic obstructive pulmonary disease) ([place])   . Degenerative disk disease    back  . Depression   . Diabetes mellitus   . Fibromyalgia   . GERD (gastroesophageal reflux disease)   . Headache(784.0)    migraines  . Heart murmur   . Hepatitis    In followup treatment for hepatitis C  . Hypertension    no medication for at this time, sees Dr. Dinah Beers Pcp  . Hypotensive episode    hx of  . Irritable bowel syndrome   . Kidney stone    with hematuria  . Memory loss   . Recurrent upper respiratory infection (URI)   . Shortness of breath   . Syncope      Patient Active Problem List   Diagnosis Date Noted  . Chronic migraine without aura, with intractable migraine, so stated, with status migrainosus 03/01/2019  . Medication overuse headache 03/01/2019  . Seasonal allergic rhinitis due to pollen 03/25/2018  . Adnexal cyst 03/25/2018  . Morbid obesity ([place]) 03/25/2018  . Essential hypertension 03/25/2018  . Pure hypercholesterolemia 03/25/2018  . Sleep-disordered breathing 03/25/2018  . Vitamin D deficiency 03/25/2018  . Bilateral lower extremity edema 03/25/2018  . Acute meniscal tear of left knee 11/24/2016  . Chronic hepatitis C without hepatic coma ([place]) 11/24/2016  . Fatty liver 11/24/2016  . H/O degenerative disc disease 11/24/2016  . COPD (chronic obstructive pulmonary disease) ([place]) 10/22/2016  . Type 2 diabetes mellitus, cannot tolerate Metformin 07/25/2014  . Fibromyalgia 07/25/2014  . MDD (major depressive disorder), recurrent severe, without psychosis ([place]) 03/17/2013  . Hepatitis C 08/02/2007  . IBS (irritable bowel syndrome) 08/02/2007  . Mild persistent asthma with acute exacerbation 09/21/1962    Past Surgical History:  Procedure Laterality Date  . CHOLECYSTECTOMY  2019  . DILATION AND CURETTAGE OF UTERUS    . Left knee surgery    . LIVER BIOPSY    . TONSILLECTOMY    . TUBAL LIGATION       OB History   No obstetric history  on file.     Family History  Problem Relation Age of Onset  . Heart disease Mother   . Diabetes Mother   . Asthma Mother   . Anemia Mother   . Cerebral aneurysm Mother        ruptured  . Kidney failure Mother   . Migraines Mother   . Parkinson's disease Mother   . Colon cancer Maternal Uncle   . Heart disease Maternal Uncle   . Heart disease Maternal Grandmother   . Diabetes Maternal Grandmother   . Asthma Maternal Grandmother   . Heart Problems Maternal Grandmother   . Heart Problems Other        through family  . Diabetes Granddaughter   . Diabetes Daughter      Social History   Tobacco Use  . Smoking status: Never Smoker  . Smokeless tobacco: Never Used  Substance Use Topics  . Alcohol use: Never    Alcohol/week: 0.0 standard drinks  . Drug use: Never    Home Medications Prior to Admission medications   Medication Sig Start Date End Date Taking? Authorizing Provider  albuterol (PROVENTIL HFA;VENTOLIN HFA) 108 (90 Base) MCG/ACT inhaler Inhale 1-2 puffs into the lungs every 6 (six) hours as needed for wheezing or shortness of breath. 03/03/18   Couture, Cortni S, PA-C  blood glucose meter kit and supplies KIT Dispense based on patient and insurance preference. Use up to four times daily as directed. (FOR ICD-9 250.00, 250.01). 03/25/18   Briscoe Deutscher, DO  dicyclomine (BENTYL) 20 MG tablet Take 1 tablet (20 mg total) by mouth 2 (two) times daily. 08/19/19   Joy, Shawn C, PA-C  diphenhydrAMINE (BENADRYL ALLERGY) 25 mg capsule Take 1 capsule (25 mg total) by mouth every 6 (six) hours as needed. 03/20/19   Melvenia Beam, MD  escitalopram (LEXAPRO) 10 MG tablet TAKE 1 TABLET(10 MG) BY MOUTH DAILY Patient not taking: Reported on 07/05/2018 06/01/18   Briscoe Deutscher, DO  Fremanezumab-vfrm (AJOVY) 225 MG/1.5ML SOSY Inject 675 mg into the skin every 3 (three) months. Used samples today. She is due for next shots 05/23/2019 will try and get approved. 03/09/19   Melvenia Beam, MD  glucose blood (ACCU-CHEK ACTIVE STRIPS) test strip Use as instructed 04/15/16   Nona Dell, PA-C  labetalol (NORMODYNE) 100 MG tablet Take 1 tablet (100 mg total) by mouth 2 (two) times daily. 08/30/19   Milton Ferguson, MD  meloxicam (MOBIC) 15 MG tablet Take 1 tablet (15 mg total) by mouth daily. 08/31/19   Virgel Manifold, MD  ondansetron (ZOFRAN ODT) 4 MG disintegrating tablet Take 1 tablet (4 mg total) by mouth every 8 (eight) hours as needed for nausea or vomiting. 08/19/19   Joy, Shawn C, PA-C  ondansetron (ZOFRAN) 4 MG tablet Take 1 tablet (4 mg total) by mouth  every 8 (eight) hours as needed for nausea or vomiting. 08/14/19   Caccavale, Sophia, PA-C  prochlorperazine (COMPAZINE) 10 MG tablet Take 1 tablet (10 mg total) by mouth every 6 (six) hours as needed. May take with '25mg'$  of benadryl for migraines.. 03/09/19   Melvenia Beam, MD  rizatriptan (MAXALT-MLT) 10 MG disintegrating tablet Take 1 tablet (10 mg total) by mouth as needed for migraine. May repeat in 2 hours if needed. Maximum 2x in one day. 03/16/19   Melvenia Beam, MD  topiramate (TOPAMAX) 100 MG tablet Take 1 tablet (100 mg total) by mouth at bedtime. 03/09/19   Melvenia Beam, MD  traMADol (ULTRAM) 50 MG tablet Take 1 tablet (50 mg total) by mouth every 6 (six) hours as needed. 08/30/19   Milton Ferguson, MD    Allergies    Anesthetic ether [ether], Nubain [nalbuphine hcl], Penicillins, Lisinopril, Advair diskus [fluticasone-salmeterol], and Lactose intolerance (gi)  Review of Systems   Review of Systems  Constitutional: Positive for chills. Negative for fever.  HENT: Positive for ear pain and sore throat. Negative for congestion, ear discharge, facial swelling, trouble swallowing and voice change.   Respiratory: Negative for cough and shortness of breath.   Gastrointestinal: Negative for abdominal pain and vomiting.  Neurological: Negative for dizziness, syncope, facial asymmetry, speech difficulty, weakness, numbness and headaches.    Physical Exam Updated Vital Signs BP 139/84 (BP Location: Right Arm)   Pulse 86   Temp 98.4 F (36.9 C) (Oral)   Resp 16   LMP  (LMP Unknown)   SpO2 100%   Physical Exam Vitals and nursing note reviewed.  Constitutional:      General: She is not in acute distress.    Appearance: She is well-developed.  HENT:     Head: Normocephalic and atraumatic.     Right Ear: Ear canal and external ear normal. No drainage or swelling. Tympanic membrane is not perforated, erythematous, retracted or bulging.     Left Ear: Ear canal and external ear  normal. No drainage or swelling. Tympanic membrane is not perforated, erythematous, retracted or bulging.     Ears:     Comments: TMs appear a bit full, but no other concerning abnormalities on ear exam.  No mastoid erythema/swelling/tenderness.     Nose: Mucosal edema present.     Right Sinus: No maxillary sinus tenderness or frontal sinus tenderness.     Left Sinus: No maxillary sinus tenderness or frontal sinus tenderness.     Mouth/Throat:     Pharynx: Uvula midline. No oropharyngeal exudate or posterior oropharyngeal erythema.     Comments: Posterior oropharynx is symmetric appearing. Patient tolerating own secretions without difficulty. No trismus. No drooling. No hot potato voice. No swelling beneath the tongue, submandibular compartment is soft.  Eyes:     General:        Right eye: No discharge.        Left eye: No discharge.     Conjunctiva/sclera: Conjunctivae normal.     Pupils: Pupils are equal, round, and reactive to light.  Cardiovascular:     Rate and Rhythm: Normal rate and regular rhythm.     Heart sounds: No murmur.  Pulmonary:     Effort: Pulmonary effort is normal. No respiratory distress.     Breath sounds: Normal breath sounds. No wheezing, rhonchi or rales.  Abdominal:     General: There is no distension.     Palpations: Abdomen is soft.     Tenderness: There is no abdominal tenderness.  Musculoskeletal:     Cervical back: Normal range of motion and neck supple. No edema or rigidity.  Lymphadenopathy:     Cervical: No cervical adenopathy.  Skin:    General: Skin is warm and dry.     Findings: No rash.  Neurological:     General: No focal deficit present.     Mental Status: She is alert.     Comments: Alert. Clear speech.  CN III through XII grossly intact. No tremors, rigors, or asterixis on exam. Ambulatory.   Psychiatric:        Behavior: Behavior normal.     ED  Results / Procedures / Treatments   Labs (all labs ordered are listed, but only  abnormal results are displayed) Labs Reviewed  CBG MONITORING, ED - Abnormal; Notable for the following components:      Result Value   Glucose-Capillary 131 (*)    All other components within normal limits    EKG None  Radiology No results found.  Procedures Procedures (including critical care time)  Medications Ordered in ED Medications - No data to display  ED Course  I have reviewed the triage vital signs and the nursing notes.  Pertinent labs & imaging results that were available during my care of the patient were reviewed by me and considered in my medical decision making (see chart for details).    MDM Rules/Calculators/A&P                      Patient presents to the emergency department with complaints of bilateral ear pain for the past 1 month.  Patient is nontoxic-appearing, resting comfortably, vitals WNL.  CBG on arrival per triage is 131.  On exam she does not have findings consistent with acute otitis media, acute otitis externa, or mastoiditis.  Centor 0, doubt strep.  Afebrile, no sinus tenderness, do not suspect acute bacterial sinusitis. No meningismus.  Lungs clear to auscultation bilaterally, no respiratory complaints, do not suspect pneumonia.  She reports some chills/shaking, no tremulous activity, no asterixis, no rigors, afebrile.  Overall unclear definitive etiology to her symptoms, given some degree of mucosal edema on nasal exam initial plan for flonase/zyrtec but then patient states she believes she has already tried these, therefore will trial Benadryl as she would like something that also helps her sleep at night, encourage patient to reschedule her ENT follow-up appointment. I discussed results, treatment plan, need for follow-up, and return precautions with the patient. Provided opportunity for questions, patient confirmed understanding and is in agreement with plan. Findings and plan of care discussed with supervising physician Dr. Ronnald Nian who is in  agreement.    Final Clinical Impression(s) / ED Diagnoses Final diagnoses:  Otalgia of both ears    Rx / DC Orders ED Discharge Orders         Ordered    fluticasone (FLONASE) 50 MCG/ACT nasal spray  Daily,   Status:  Discontinued     09/24/19 1227    cetirizine (ZYRTEC ALLERGY) 10 MG tablet  Daily,   Status:  Discontinued     09/24/19 1227    diphenhydrAMINE (BENADRYL) 25 MG tablet  Every 6 hours PRN     09/24/19 1302           Clydette Privitera, [place] R, PA-C 09/24/19 1232    Dimetri Armitage, Glynda Jaeger, PA-C 09/24/19 1303    Lennice Sites, DO 09/24/19 1326

## 2019-10-01 ENCOUNTER — Other Ambulatory Visit: Payer: Self-pay

## 2019-10-01 ENCOUNTER — Encounter (HOSPITAL_COMMUNITY): Payer: Self-pay | Admitting: Emergency Medicine

## 2019-10-01 ENCOUNTER — Emergency Department (HOSPITAL_COMMUNITY)
Admission: EM | Admit: 2019-10-01 | Discharge: 2019-10-01 | Disposition: A | Discharge status: Home / Self Care | Payer: Medicaid Other | Attending: Emergency Medicine | Admitting: Emergency Medicine

## 2019-10-01 DIAGNOSIS — R197 Diarrhea, unspecified: Secondary | ICD-10-CM

## 2019-10-01 DIAGNOSIS — J45909 Unspecified asthma, uncomplicated: Secondary | ICD-10-CM | POA: Insufficient documentation

## 2019-10-01 DIAGNOSIS — Z79899 Other long term (current) drug therapy: Secondary | ICD-10-CM | POA: Diagnosis not present

## 2019-10-01 DIAGNOSIS — J449 Chronic obstructive pulmonary disease, unspecified: Secondary | ICD-10-CM | POA: Insufficient documentation

## 2019-10-01 DIAGNOSIS — E119 Type 2 diabetes mellitus without complications: Secondary | ICD-10-CM | POA: Insufficient documentation

## 2019-10-01 DIAGNOSIS — I10 Essential (primary) hypertension: Secondary | ICD-10-CM | POA: Insufficient documentation

## 2019-10-01 DIAGNOSIS — Z20822 Contact with and (suspected) exposure to covid-19: Secondary | ICD-10-CM | POA: Diagnosis not present

## 2019-10-01 DIAGNOSIS — Z791 Long term (current) use of non-steroidal anti-inflammatories (NSAID): Secondary | ICD-10-CM | POA: Insufficient documentation

## 2019-10-01 DIAGNOSIS — R0602 Shortness of breath: Secondary | ICD-10-CM | POA: Diagnosis present

## 2019-10-01 DIAGNOSIS — R6883 Chills (without fever): Secondary | ICD-10-CM | POA: Insufficient documentation

## 2019-10-01 LAB — D-DIMER, QUANTITATIVE: D-Dimer, Quant: <0.27 ug/mL-FEU (ref 0.00–0.50)

## 2019-10-01 LAB — CBC
HCT: 41.1 % (ref 36.0–46.0)
Hemoglobin: 14.2 g/dL (ref 12.0–15.0)
MCH: 33.6 pg (ref 26.0–34.0)
MCHC: 34.5 g/dL (ref 30.0–36.0)
MCV: 97.4 fL (ref 80.0–100.0)
Platelets: 193 10*3/uL (ref 150–400)
RBC: 4.22 MIL/uL (ref 3.87–5.11)
RDW: 13.2 % (ref 11.5–15.5)
WBC: 10.3 10*3/uL (ref 4.0–10.5)
nRBC: 0 % (ref 0.0–0.2)

## 2019-10-01 LAB — POC SARS CORONAVIRUS 2 AG -  ED: SARS Coronavirus 2 Ag: NEGATIVE

## 2019-10-01 LAB — BASIC METABOLIC PANEL
Anion gap: 9 (ref 5–15)
BUN: 19 mg/dL (ref 6–20)
CO2: 28 mmol/L (ref 22–32)
Calcium: 9 mg/dL (ref 8.9–10.3)
Chloride: 105 mmol/L (ref 98–111)
Creatinine, Ser: 0.82 mg/dL (ref 0.44–1.00)
GFR calc Af Amer: >60 mL/min (ref >60)
GFR calc non Af Amer: >60 mL/min (ref >60)
Glucose, Bld: 135 mg/dL — ABNORMAL HIGH (ref 70–99)
Potassium: 3.3 mmol/L — ABNORMAL LOW (ref 3.5–5.1)
Sodium: 142 mmol/L (ref 135–145)

## 2019-10-01 LAB — TROPONIN I (HIGH SENSITIVITY): Troponin I (High Sensitivity): 5 ng/L (ref <18)

## 2019-10-01 MED ORDER — LORAZEPAM 2 MG/ML IJ SOLN
1.0000 mg | Freq: Once | INTRAMUSCULAR | Status: AC
  Administered 2019-10-01: 1 mg via INTRAVENOUS
  Filled 2019-10-01: qty 1

## 2019-10-01 MED ORDER — SODIUM CHLORIDE 0.9 % IV BOLUS
1000.0000 mL | Freq: Once | INTRAVENOUS | Status: AC
  Administered 2019-10-01: 1000 mL via INTRAVENOUS

## 2019-10-01 MED ORDER — POTASSIUM CHLORIDE CRYS ER 20 MEQ PO TBCR: 20.0000 meq | EXTENDED_RELEASE_TABLET | Freq: Two times a day (BID) | ORAL | 0 refills | Status: DC

## 2019-10-01 MED ORDER — SODIUM CHLORIDE 0.9% FLUSH
3.0000 mL | Freq: Once | INTRAVENOUS | Status: AC
  Administered 2019-10-01: 3 mL via INTRAVENOUS

## 2019-10-01 NOTE — ED Notes (Signed)
Pt maintained 94-98% while ambulating, no resp distress noted.

## 2019-10-01 NOTE — Discharge Instructions (Signed)
Take potassium for the next couple of days Drink plenty of fluids to stay hydrated Please follow up with your doctor

## 2019-10-01 NOTE — ED Notes (Signed)
ED Provider at bedside. 

## 2019-10-01 NOTE — ED Triage Notes (Signed)
C/o body aches, chills, SOB, diarrhea, and fatigue x 1 month.  Reports substernal chest tightness x 2 days and sore throat yesterday.

## 2019-10-01 NOTE — ED Provider Notes (Signed)
[facility] EMERGENCY DEPARTMENT Provider Note   CSN: [id number] Arrival date & time: 10/01/19  8280     History Chief Complaint  Patient presents with  . Covid testing  . Chest Pain  . Shortness of Breath  . Diarrhea    Susan Hogan is a 59 y.o. female with history of Hep C, HTN, asthma/COPD, chronic migraines who presents with multiple complaints. She states that early this morning around 3AM she woke up with acute onset of chills, body aches, low back pain. She went to the bathroom and has had diarrhea and feels nauseous. She then started to have shortness of breath with exertion as well as central chest tightness. She denies fever, URI symptoms, chest pain, cough, wheezing, vomiting. She has been having urinary frequency for the past couple days and her blood sugar and blood pressure have been "going crazy" and have been elevated. She went to [facility] ED this morning because her daughter was going there for an unrelated problem. She had labs which showed mild hypokalemia (3.6), mild hyperglycemia (130), mild elevation of LFTs: ALT (82) AST (63) which is chronic from her Hep C. UA appeared over all clear. CXR was clear. She came to the ED here because she was told "nothing was wrong" and she feels like something is very wrong with her. She tested positive for COVID on 11/28.   HPI     Past Medical History:  Diagnosis Date  . Anemia   . Arthritis    RA "states does not see Rheumatologist"  . Asthma   . Carpal tunnel syndrome    bilaterally  . Complication of anesthesia    difficulty breathing  . COPD (chronic obstructive pulmonary disease) ([place])   . Degenerative disk disease    back  . Depression   . Diabetes mellitus   . Fibromyalgia   . GERD (gastroesophageal reflux disease)   . Headache(784.0)    migraines  . Heart murmur   . Hepatitis    In followup treatment for hepatitis C  . Hypertension    no medication for at this time, sees Dr. Dinah Beers Pcp  . Hypotensive episode    hx of  . Irritable bowel syndrome   . Kidney stone    with hematuria  . Memory loss   . Recurrent upper respiratory infection (URI)   . Shortness of breath   . Syncope     Patient Active Problem List   Diagnosis Date Noted  . Chronic migraine without aura, with intractable migraine, so stated, with status migrainosus 03/01/2019  . Medication overuse headache 03/01/2019  . Seasonal allergic rhinitis due to pollen 03/25/2018  . Adnexal cyst 03/25/2018  . Morbid obesity ([place]) 03/25/2018  . Essential hypertension 03/25/2018  . Pure hypercholesterolemia 03/25/2018  . Sleep-disordered breathing 03/25/2018  . Vitamin D deficiency 03/25/2018  . Bilateral lower extremity edema 03/25/2018  . Acute meniscal tear of left knee 11/24/2016  . Chronic hepatitis C without hepatic coma ([place]) 11/24/2016  . Fatty liver 11/24/2016  . H/O degenerative disc disease 11/24/2016  . COPD (chronic obstructive pulmonary disease) ([place]) 10/22/2016  . Type 2 diabetes mellitus, cannot tolerate Metformin 07/25/2014  . Fibromyalgia 07/25/2014  . MDD (major depressive disorder), recurrent severe, without psychosis ([place]) 03/17/2013  . Hepatitis C 08/02/2007  . IBS (irritable bowel syndrome) 08/02/2007  . Mild persistent asthma with acute exacerbation 09/21/1962    Past Surgical History:  Procedure Laterality Date  . CHOLECYSTECTOMY  2019  .  DILATION AND CURETTAGE OF UTERUS    . Left knee surgery    . LIVER BIOPSY    . TONSILLECTOMY    . TUBAL LIGATION       OB History   No obstetric history on file.     Family History  Problem Relation Age of Onset  . Heart disease Mother   . Diabetes Mother   . Asthma Mother   . Anemia Mother   . Cerebral aneurysm Mother        ruptured  . Kidney failure Mother   . Migraines Mother   . Parkinson's disease Mother   . Colon cancer Maternal Uncle   . Heart disease Maternal Uncle   . Heart disease Maternal Grandmother     . Diabetes Maternal Grandmother   . Asthma Maternal Grandmother   . Heart Problems Maternal Grandmother   . Heart Problems Other        through family  . Diabetes Granddaughter   . Diabetes Daughter     Social History   Tobacco Use  . Smoking status: Never Smoker  . Smokeless tobacco: Never Used  Substance Use Topics  . Alcohol use: Never    Alcohol/week: 0.0 standard drinks  . Drug use: Never    Home Medications Prior to Admission medications   Medication Sig Start Date End Date Taking? Authorizing Provider  albuterol (PROVENTIL HFA;VENTOLIN HFA) 108 (90 Base) MCG/ACT inhaler Inhale 1-2 puffs into the lungs every 6 (six) hours as needed for wheezing or shortness of breath. 03/03/18   Couture, Cortni S, PA-C  blood glucose meter kit and supplies KIT Dispense based on patient and insurance preference. Use up to four times daily as directed. (FOR ICD-9 250.00, 250.01). 03/25/18   Briscoe Deutscher, DO  dicyclomine (BENTYL) 20 MG tablet Take 1 tablet (20 mg total) by mouth 2 (two) times daily. 08/19/19   Joy, Shawn C, PA-C  diphenhydrAMINE (BENADRYL) 25 MG tablet Take 1 tablet (25 mg total) by mouth every 6 (six) hours as needed. 09/24/19   Petrucelli, Samantha R, PA-C  Fremanezumab-vfrm (AJOVY) 225 MG/1.5ML SOSY Inject 675 mg into the skin every 3 (three) months. Used samples today. She is due for next shots 05/23/2019 will try and get approved. 03/09/19   Melvenia Beam, MD  glucose blood (ACCU-CHEK ACTIVE STRIPS) test strip Use as instructed 04/15/16   Nona Dell, PA-C  labetalol (NORMODYNE) 100 MG tablet Take 1 tablet (100 mg total) by mouth 2 (two) times daily. 08/30/19   Milton Ferguson, MD  meloxicam (MOBIC) 15 MG tablet Take 1 tablet (15 mg total) by mouth daily. 08/31/19   Virgel Manifold, MD  ondansetron (ZOFRAN ODT) 4 MG disintegrating tablet Take 1 tablet (4 mg total) by mouth every 8 (eight) hours as needed for nausea or vomiting. 08/19/19   Joy, Shawn C, PA-C   ondansetron (ZOFRAN) 4 MG tablet Take 1 tablet (4 mg total) by mouth every 8 (eight) hours as needed for nausea or vomiting. 08/14/19   Caccavale, Sophia, PA-C  prochlorperazine (COMPAZINE) 10 MG tablet Take 1 tablet (10 mg total) by mouth every 6 (six) hours as needed. May take with '25mg'$  of benadryl for migraines.. 03/09/19   Melvenia Beam, MD  rizatriptan (MAXALT-MLT) 10 MG disintegrating tablet Take 1 tablet (10 mg total) by mouth as needed for migraine. May repeat in 2 hours if needed. Maximum 2x in one day. 03/16/19   Melvenia Beam, MD  topiramate (TOPAMAX) 100 MG tablet Take  1 tablet (100 mg total) by mouth at bedtime. 03/09/19   Melvenia Beam, MD  traMADol (ULTRAM) 50 MG tablet Take 1 tablet (50 mg total) by mouth every 6 (six) hours as needed. 08/30/19   Milton Ferguson, MD  cetirizine (ZYRTEC ALLERGY) 10 MG tablet Take 1 tablet (10 mg total) by mouth daily. 09/24/19 09/24/19  Petrucelli, Samantha R, PA-C  escitalopram (LEXAPRO) 10 MG tablet TAKE 1 TABLET(10 MG) BY MOUTH DAILY Patient not taking: Reported on 07/05/2018 06/01/18 09/24/19  Briscoe Deutscher, DO  fluticasone (FLONASE) 50 MCG/ACT nasal spray Place 1 spray into both nostrils daily. 09/24/19 09/24/19  Petrucelli, Glynda Jaeger, PA-C    Allergies    Anesthetic ether [ether], Nubain [nalbuphine hcl], Penicillins, Lisinopril, Advair diskus [fluticasone-salmeterol], and Lactose intolerance (gi)  Review of Systems   Review of Systems  Constitutional: Positive for chills. Negative for fever.  Respiratory: Positive for chest tightness and shortness of breath. Negative for cough and wheezing.   Cardiovascular: Negative for chest pain.  Gastrointestinal: Positive for diarrhea and nausea. Negative for abdominal pain and vomiting.  Genitourinary: Positive for frequency. Negative for dysuria.  Neurological: Positive for tremors.  All other systems reviewed and are negative.   Physical Exam Updated Vital Signs BP (!) 165/85 (BP Location: Right  Arm)   Pulse 80   Temp 98.7 F (37.1 C) (Oral)   Resp 16   LMP  (LMP Unknown)   SpO2 100%   Physical Exam Vitals and nursing note reviewed.  Constitutional:      General: She is not in acute distress.    Appearance: Normal appearance. She is well-developed. She is obese. She is not ill-appearing.  HENT:     Head: Normocephalic and atraumatic.  Eyes:     General: No scleral icterus.       Right eye: No discharge.        Left eye: No discharge.     Conjunctiva/sclera: Conjunctivae normal.     Pupils: Pupils are equal, round, and reactive to light.  Cardiovascular:     Rate and Rhythm: Normal rate and regular rhythm.  Pulmonary:     Effort: Pulmonary effort is normal. No respiratory distress.     Breath sounds: Normal breath sounds.  Abdominal:     General: There is no distension.     Palpations: Abdomen is soft.     Tenderness: There is abdominal tenderness (mild epigastric tenderness).  Musculoskeletal:     Cervical back: Normal range of motion.  Skin:    General: Skin is warm and dry.  Neurological:     Mental Status: She is alert and oriented to person, place, and time.  Psychiatric:        Mood and Affect: Mood is anxious.        Behavior: Behavior normal.     ED Results / Procedures / Treatments   Labs (all labs ordered are listed, but only abnormal results are displayed) Labs Reviewed  BASIC METABOLIC PANEL - Abnormal; Notable for the following components:      Result Value   Potassium 3.3 (*)    Glucose, Bld 135 (*)    All other components within normal limits  CBC  D-DIMER, QUANTITATIVE (NOT AT [facility])  POC SARS CORONAVIRUS 2 AG -  ED  TROPONIN I (HIGH SENSITIVITY)    EKG EKG Interpretation  Date/Time:  Sunday October 01 2019 07:24:58 EST Ventricular Rate:  75 PR Interval:  142 QRS Duration: 88 QT Interval:  378 QTC Calculation:  422 R Axis:   29 Text Interpretation: Normal sinus rhythm Cannot rule out Anterior infarct , age undetermined Abnormal  ECG When compared to prior, no signifiacnt cahnges seen. No STEMI Confirmed by Antony Blackbird [id number]) on 10/01/2019 7:59:26 AM   Radiology No results found.  Procedures Procedures (including critical care time)  Medications Ordered in ED Medications  sodium chloride flush (NS) 0.9 % injection 3 mL (3 mLs Intravenous Given 10/01/19 0834)  sodium chloride 0.9 % bolus 1,000 mL (0 mLs Intravenous Stopped 10/01/19 0927)  LORazepam (ATIVAN) injection 1 mg (1 mg Intravenous Given 10/01/19 0835)    ED Course  I have reviewed the triage vital signs and the nursing notes.  Pertinent labs & imaging results that were available during my care of the patient were reviewed by me and considered in my medical decision making (see chart for details).  59 year old female presents with multiple complaints. She is mildly hypertensive here but otherwise vitals are normal. Her exam is unremarkable. She had an evaluation at [facility] just hours prior to arrival. EKG is SR. CXR at outside facility was negative. Labs here are overall reassuring. She has mild hypokalemia (3.3). D-dimer and trop are normal. Discussed with pt. With 2 reassuring work ups today I think she can go home. Amblatory sats here are normal. She states she has an appt with her PCP tomorrow. Will give her rx for potassium and d/c.  MDM Rules/Calculators/A&P                      Final Clinical Impression(s) / ED Diagnoses Final diagnoses:  Shortness of breath  Diarrhea, unspecified type  Chills    Rx / DC Orders ED Discharge Orders    None       Recardo Evangelist, PA-C 10/01/19 1020    Tegeler, Gwenyth Allegra, MD 10/01/19 1234

## 2019-10-03 ENCOUNTER — Telehealth: Payer: Self-pay | Admitting: Cardiovascular Disease

## 2019-10-03 NOTE — Telephone Encounter (Signed)
Pt is going to ED Per pt was told by another MD to do so ./cy

## 2019-10-03 NOTE — Telephone Encounter (Signed)
Pt c/o Shortness Of Breath: STAT if SOB developed within the last 24 hours or pt is noticeably SOB on the phone  1. Are you currently SOB (can you hear that pt is SOB on the phone)? no  2. How long have you been experiencing SOB? Started 1/10  3. Are you SOB when sitting or when up moving around? both  4. Are you currently experiencing any other symptoms?Patient is very cold and has been having hot flashes. Her BP has also been high and her blood sugar has been high and low. She has an appointment with Dr.O'Neal 1/19, but would like to see if she can get a sooner appointment. She says she has not had SOB before and is very concerned.

## 2019-10-08 NOTE — Progress Notes (Signed)
Cardiology Office Note:   Date:  10/10/2019  NAME:  Susan Hogan    MRN: [id number] DOB:  1960/09/26   PCP:  Nicholes Rough, PA-C  Cardiologist:  No primary care provider on file.  Electrophysiologist:  None   Referring MD: Nicholes Rough, PA-C   Chief Complaint  Patient presents with  . Shortness of Breath   History of Present Illness:   Susan Hogan is a 59 y.o. female with a hx of asthma who is being seen today for the evaluation of shortness of breath at the request of Nicholes Rough, [place].  She was diagnosed with coronavirus in November 2020.  Since that time she goes to the emergency room for what I can tell every other week for a multitude of complaints including shortness of breath, ear pain, arthritic symptoms.  Her work-up is always negative and she is discharged.  She was evaluated in the emergency room today at [facility].  Her complaints were shortness of breath, elevated blood pressure and palpitations.  Her EKG was normal sinus rhythm.  A BNP was checked and this value was 22.4.  A chest x-ray was obtained today as well that showed no acute abnormality.  Her blood pressure today is 148/87.  She reports this is the best has been in a while.  She reports over the past 1 to 2 months has had episodic episodes of palpitations and shortness of breath.  She reports that any heavy activity will get her out of breath.  She also reports that her blood pressure has been quite elevated over the recent few weeks.  She reports she is on losartan 50 mg daily but this is not controlling her blood pressure.  She also reports an overall sense of fatigue.  She has had ear pain that has been evaluated the emergency room.  It appears that there is been no real identifiable cause of her symptoms.  She reports she just feels something is wrong and she does not feel well.  She reports walking in our office and was quite short of breath.  She had an echocardiogram in 2016 as well as nuclear medicine stress  test in 2016 that were normal.  No recent thyroid studies that I can tell.  Her A1c from 2019 was 5.7.  She reports significant fluctuations in her blood sugar as well.  She takes no medications for diabetes.  Cardiovascular disease risk factors include morbid obesity, BMI 42.  She is a never smoker.  She does not consume alcohol in excess.  No strong family history of cardiovascular disease.   Past Medical History: Past Medical History:  Diagnosis Date  . Anemia   . Arthritis    RA "states does not see Rheumatologist"  . Asthma   . Carpal tunnel syndrome    bilaterally  . Complication of anesthesia    difficulty breathing  . COPD (chronic obstructive pulmonary disease) ([place])   . COVID-19   . Degenerative disk disease    back  . Depression   . Diabetes mellitus   . Fibromyalgia   . GERD (gastroesophageal reflux disease)   . Headache(784.0)    migraines  . Heart murmur   . Hepatitis    In followup treatment for hepatitis C  . Hypertension    no medication for at this time, sees Dr. Dinah Beers Pcp  . Hypotensive episode    hx of  . Irritable bowel syndrome   . Kidney stone    with hematuria  .  Memory loss   . Recurrent upper respiratory infection (URI)   . Shortness of breath   . Syncope     Past Surgical History: Past Surgical History:  Procedure Laterality Date  . CHOLECYSTECTOMY  2019  . DILATION AND CURETTAGE OF UTERUS    . Left knee surgery    . LIVER BIOPSY    . TONSILLECTOMY    . TUBAL LIGATION      Current Medications: Current Meds  Medication Sig  . albuterol (PROVENTIL HFA;VENTOLIN HFA) 108 (90 Base) MCG/ACT inhaler Inhale 1-2 puffs into the lungs every 6 (six) hours as needed for wheezing or shortness of breath.  . blood glucose meter kit and supplies KIT Dispense based on patient and insurance preference. Use up to four times daily as directed. (FOR ICD-9 250.00, 250.01).  . fluticasone (FLONASE) 50 MCG/ACT nasal spray Place 2 sprays into both  nostrils daily.  Marland Kitchen glucose blood (ACCU-CHEK ACTIVE STRIPS) test strip Use as instructed  . HYDROcodone-acetaminophen (NORCO/VICODIN) 5-325 MG tablet Take 1-2 tablets by mouth every 6 (six) hours as needed.  . predniSONE (DELTASONE) 10 MG tablet Take 10 mg by mouth 2 (two) times daily.  . rizatriptan (MAXALT-MLT) 10 MG disintegrating tablet Take 1 tablet (10 mg total) by mouth as needed for migraine. May repeat in 2 hours if needed. Maximum 2x in one day.  . [DISCONTINUED] cetirizine (ZYRTEC ALLERGY) 10 MG tablet Take 1 tablet (10 mg total) by mouth daily.     Allergies:    Anesthetic ether [ether], Nubain [nalbuphine hcl], Penicillins, Lisinopril, Advair diskus [fluticasone-salmeterol], and Lactose intolerance (gi)   Social History: Social History   Socioeconomic History  . Marital status: Single    Spouse name: Not on file  . Number of children: 7  . Years of education: Not on file  . Highest education level: Not on file  Occupational History  . Occupation: Disabled  Tobacco Use  . Smoking status: Never Smoker  . Smokeless tobacco: Never Used  Substance and Sexual Activity  . Alcohol use: Never    Alcohol/week: 0.0 standard drinks  . Drug use: Never  . Sexual activity: Not on file  Other Topics Concern  . Not on file  Social History Narrative   Lives at home. Has two sons and her grandchildren with her.    Right handed   Caffeine: very rare   Social Determinants of Health   Financial Resource Strain:   . Difficulty of Paying Living Expenses: Not on file  Food Insecurity:   . Worried About Charity fundraiser in the Last Year: Not on file  . Ran Out of Food in the Last Year: Not on file  Transportation Needs:   . Lack of Transportation (Medical): Not on file  . Lack of Transportation (Non-Medical): Not on file  Physical Activity:   . Days of Exercise per Week: Not on file  . Minutes of Exercise per Session: Not on file  Stress:   . Feeling of Stress : Not on file    Social Connections:   . Frequency of Communication with Friends and Family: Not on file  . Frequency of Social Gatherings with Friends and Family: Not on file  . Attends Religious Services: Not on file  . Active Member of Clubs or Organizations: Not on file  . Attends Archivist Meetings: Not on file  . Marital Status: Not on file     Family History: The patient's family history includes Anemia in her mother;  Asthma in her maternal grandmother and mother; Cerebral aneurysm in her mother; Colon cancer in her maternal uncle; Diabetes in her daughter, granddaughter, maternal grandmother, and mother; Heart Problems in her maternal grandmother and another family member; Heart disease in her maternal grandmother, maternal uncle, and mother; Kidney failure in her mother; Migraines in her mother; Parkinson's disease in her mother.  ROS:   All other ROS reviewed and negative. Pertinent positives noted in the HPI.     EKGs/Labs/Other Studies Reviewed:   The following studies were personally reviewed by me today:  EKG:  EKG was reviewed from the emergency room visit from today.  The ekg ordered today demonstrates normal sinus rhythm, heart rate 79, no acute ST-T changes, no evidence of prior infarction, and was personally reviewed by me.   CT PE Study 08/19/2019 IMPRESSION: 1. No CT evidence of pulmonary artery embolus. 2. Cardiomegaly with mild congestive changes. Superimposed pneumonia is not excluded. Clinical correlation is recommended.  Hypertension can you prescribe her amlodipine 10 mg daily  Recent Labs: 10/10/2019: ALT 101; B Natriuretic Peptide 22.4; BUN 11; Creatinine, Ser 0.70; Hemoglobin 14.1; Platelets 172; Potassium 3.4; Sodium 142   Recent Lipid Panel    Component Value Date/Time   CHOL 127 03/25/2018 1537   TRIG 102 08/19/2019 1937   HDL 50 (L) 03/25/2018 1537   CHOLHDL 2.5 03/25/2018 1537   VLDL 25.4 10/22/2016 1451   LDLCALC 60 03/25/2018 1537    Physical  Exam:   VS:  BP (!) 148/87   Pulse 71   Ht 5' (1.524 m)   Wt 215 lb (97.5 kg)   LMP  (LMP Unknown)   SpO2 96%   BMI 41.99 kg/m    Wt Readings from Last 3 Encounters:  10/10/19 215 lb (97.5 kg)  10/10/19 215 lb (97.5 kg)  09/07/19 223 lb 15.8 oz (101.6 kg)    General: Well nourished, well developed, in no acute distress Heart: Atraumatic, normal size  Eyes: PEERLA, EOMI  Neck: Supple, no JVD Endocrine: No thryomegaly Cardiac: Normal S1, S2; RRR; no murmurs, rubs, or gallops Lungs: Clear to auscultation bilaterally, no wheezing, rhonchi or rales  Abd: Soft, nontender, no hepatomegaly  Ext: No edema, pulses 2+ Musculoskeletal: No deformities, BUE and BLE strength normal and equal Skin: Warm and dry, no rashes   Neuro: Alert and oriented to person, place, time, and situation, CNII-XII grossly intact, no focal deficits  Psych: Normal mood and affect   ASSESSMENT:   Surina Fergeson is a 59 y.o. female who presents for the following: 1. SOB (shortness of breath) on exertion   2. Chest pain of uncertain etiology   3. Palpitations   4. Obesity, morbid, BMI 40.0-49.9 ([place])   5. Essential hypertension     PLAN:   1. SOB (shortness of breath) on exertion 2. Chest pain of uncertain etiology 3. Palpitations -She presents with several months of shortness of breath with exertion as well as intermittent chest pain.  Her chest pain is atypical and her shortness of breath could be related to recent coronavirus pneumonia.  She is still getting over all of this.  Recent BN P from the emergency room today was normal.  I think we should repeat an echocardiogram as well obtain a TSH.  I also discussed with her that a [place] nuclear medicine stress test would likely be beneficial.  We need to exclude any obstructive CAD to explain her symptoms.  If the above work-up is negative her symptoms are likely just  related to post Covid pneumonia.  I will then refer her to pulmonology if the above work-up is  negative.  4. Obesity, morbid, BMI 40.0-49.9 (H[facility]) -BMI 42.  Counseled importance of diet and exercise.  We will exclude any cardiac etiology for her symptoms and then she will resume exercising.  I will also go ahead and repeat an A1c.  She reports her diabetes has been out of control.  Per my review of her A1c in 2019 it was 5.7.  5. Essential hypertension -Blood pressure not at goal here today.  We will continue her losartan 50 mg daily.  I have added amlodipine 10 mg daily.  Her symptoms could be related to poorly controlled blood pressure.  We will work to get her blood pressure under better control.   Disposition: Return in about 1 month (around 11/10/2019).  Medication Adjustments/Labs and Tests Ordered: Current medicines are reviewed at length with the patient today.  Concerns regarding medicines are outlined above.  Orders Placed This Encounter  Procedures  . Hemoglobin A1c  . TSH  . MYOCARDIAL PERFUSION IMAGING  . ECHOCARDIOGRAM COMPLETE   Meds ordered this encounter  Medications  . amLODipine (NORVASC) 10 MG tablet    Sig: Take 1 tablet (10 mg total) by mouth daily.    Dispense:  90 tablet    Refill:  3    Patient Instructions  Medication Instructions:  Start Amlodipine 10 mg daily.  *If you need a refill on your cardiac medications before your next appointment, please call your pharmacy*  Lab Work: TSH, A1C today If you have labs (blood work) drawn today and your tests are completely normal, you will receive your results only by: Marland Kitchen MyChart Message (if you have MyChart) OR . A paper copy in the mail If you have any lab test that is abnormal or we need to change your treatment, we will call you to review the results.  Testing/Procedures: Echocardiogram - Your physician has requested that you have an echocardiogram. Echocardiography is a painless test that uses sound waves to create images of your heart. It provides your doctor with information about the size and  shape of your heart and how well your heart's chambers and valves are working. This procedure takes approximately one hour. There are no restrictions for this procedure. This will be performed at our [facility] location - [address].  Your physician has requested that you have a lexiscan myoview. A cardiac stress test is a cardiological test that measures the heart's ability to respond to external stress in a controlled clinical environment. The stress response is induced by intravenous pharmacological stimulation.     Follow-Up: At [facility], you and your health needs are our priority.  As part of our continuing mission to provide you with exceptional heart care, we have created designated Provider Care Teams.  These Care Teams include your primary Cardiologist (physician) and Advanced Practice Providers (APPs -  Physician Assistants and Nurse Practitioners) who all work together to provide you with the care you need, when you need it.  Your next appointment:   1 month(s)  The format for your next appointment:   In Person  Provider:   Eleonore Chiquito, MD       Signed, Addison Naegeli. Audie Box, [place]  [address] [place], [place] [address] [phone]  10/10/2019 5:09 PM

## 2019-10-10 ENCOUNTER — Encounter: Payer: Self-pay | Admitting: Cardiovascular Disease

## 2019-10-10 ENCOUNTER — Encounter (HOSPITAL_COMMUNITY): Payer: Self-pay | Admitting: Emergency Medicine

## 2019-10-10 ENCOUNTER — Emergency Department (HOSPITAL_COMMUNITY)
Admission: EM | Admit: 2019-10-10 | Discharge: 2019-10-10 | Disposition: A | Discharge status: Home / Self Care | Payer: Medicaid Other | Attending: Emergency Medicine | Admitting: Emergency Medicine

## 2019-10-10 ENCOUNTER — Ambulatory Visit (INDEPENDENT_AMBULATORY_CARE_PROVIDER_SITE_OTHER): Payer: Medicaid Other | Admitting: Cardiovascular Disease

## 2019-10-10 ENCOUNTER — Emergency Department (HOSPITAL_COMMUNITY): Payer: Medicaid Other

## 2019-10-10 ENCOUNTER — Other Ambulatory Visit: Payer: Self-pay

## 2019-10-10 VITALS — BP 148/87 | HR 71 | Ht 60.0 in | Wt 215.0 lb

## 2019-10-10 DIAGNOSIS — J449 Chronic obstructive pulmonary disease, unspecified: Secondary | ICD-10-CM | POA: Diagnosis not present

## 2019-10-10 DIAGNOSIS — R531 Weakness: Secondary | ICD-10-CM | POA: Diagnosis not present

## 2019-10-10 DIAGNOSIS — R079 Chest pain, unspecified: Secondary | ICD-10-CM | POA: Diagnosis not present

## 2019-10-10 DIAGNOSIS — I10 Essential (primary) hypertension: Secondary | ICD-10-CM | POA: Diagnosis not present

## 2019-10-10 DIAGNOSIS — I160 Hypertensive urgency: Secondary | ICD-10-CM | POA: Insufficient documentation

## 2019-10-10 DIAGNOSIS — R002 Palpitations: Secondary | ICD-10-CM

## 2019-10-10 DIAGNOSIS — R55 Syncope and collapse: Secondary | ICD-10-CM | POA: Diagnosis present

## 2019-10-10 DIAGNOSIS — Z79899 Other long term (current) drug therapy: Secondary | ICD-10-CM | POA: Insufficient documentation

## 2019-10-10 DIAGNOSIS — R0602 Shortness of breath: Secondary | ICD-10-CM | POA: Diagnosis not present

## 2019-10-10 DIAGNOSIS — Z8616 Personal history of COVID-19: Secondary | ICD-10-CM | POA: Insufficient documentation

## 2019-10-10 HISTORY — DX: COVID-19: U07.1

## 2019-10-10 LAB — CBC WITH DIFFERENTIAL/PLATELET
Abs Immature Granulocytes: 0.02 10*3/uL (ref 0.00–0.07)
Basophils Absolute: 0 10*3/uL (ref 0.0–0.1)
Basophils Relative: 0 %
Eosinophils Absolute: 0.1 10*3/uL (ref 0.0–0.5)
Eosinophils Relative: 1 %
HCT: 41.3 % (ref 36.0–46.0)
Hemoglobin: 14.1 g/dL (ref 12.0–15.0)
Immature Granulocytes: 0 %
Lymphocytes Relative: 27 %
Lymphs Abs: 2.1 10*3/uL (ref 0.7–4.0)
MCH: 33.3 pg (ref 26.0–34.0)
MCHC: 34.1 g/dL (ref 30.0–36.0)
MCV: 97.4 fL (ref 80.0–100.0)
Monocytes Absolute: 0.6 10*3/uL (ref 0.1–1.0)
Monocytes Relative: 8 %
Neutro Abs: 4.9 10*3/uL (ref 1.7–7.7)
Neutrophils Relative %: 64 %
Platelets: 172 10*3/uL (ref 150–400)
RBC: 4.24 MIL/uL (ref 3.87–5.11)
RDW: 13.4 % (ref 11.5–15.5)
WBC: 7.7 10*3/uL (ref 4.0–10.5)
nRBC: 0 % (ref 0.0–0.2)

## 2019-10-10 LAB — URINALYSIS, ROUTINE W REFLEX MICROSCOPIC
Bacteria, UA: NONE SEEN
Bilirubin Urine: NEGATIVE
Glucose, UA: NEGATIVE mg/dL
Ketones, ur: NEGATIVE mg/dL
Nitrite: NEGATIVE
Protein, ur: NEGATIVE mg/dL
Specific Gravity, Urine: 1.005 (ref 1.005–1.030)
pH: 6 (ref 5.0–8.0)

## 2019-10-10 LAB — COMPREHENSIVE METABOLIC PANEL
ALT: 101 U/L — ABNORMAL HIGH (ref 0–44)
AST: 92 U/L — ABNORMAL HIGH (ref 15–41)
Albumin: 3.6 g/dL (ref 3.5–5.0)
Alkaline Phosphatase: 68 U/L (ref 38–126)
Anion gap: 10 (ref 5–15)
BUN: 11 mg/dL (ref 6–20)
CO2: 24 mmol/L (ref 22–32)
Calcium: 9.2 mg/dL (ref 8.9–10.3)
Chloride: 108 mmol/L (ref 98–111)
Creatinine, Ser: 0.7 mg/dL (ref 0.44–1.00)
GFR calc Af Amer: >60 mL/min (ref >60)
GFR calc non Af Amer: >60 mL/min (ref >60)
Glucose, Bld: 135 mg/dL — ABNORMAL HIGH (ref 70–99)
Potassium: 3.4 mmol/L — ABNORMAL LOW (ref 3.5–5.1)
Sodium: 142 mmol/L (ref 135–145)
Total Bilirubin: 0.7 mg/dL (ref 0.3–1.2)
Total Protein: 6.2 g/dL — ABNORMAL LOW (ref 6.5–8.1)

## 2019-10-10 LAB — BRAIN NATRIURETIC PEPTIDE: B Natriuretic Peptide: 22.4 pg/mL (ref 0.0–100.0)

## 2019-10-10 MED ORDER — KETOROLAC TROMETHAMINE 30 MG/ML IJ SOLN
15.0000 mg | Freq: Once | INTRAMUSCULAR | Status: AC
  Administered 2019-10-10: 11:00:00 15 mg via INTRAVENOUS
  Filled 2019-10-10: qty 1

## 2019-10-10 MED ORDER — AMLODIPINE BESYLATE 10 MG PO TABS: 10.0000 mg | ORAL_TABLET | Freq: Every day | ORAL | 3 refills | Status: DC

## 2019-10-10 MED ORDER — SODIUM CHLORIDE 0.9 % IV BOLUS
500.0000 mL | Freq: Once | INTRAVENOUS | Status: AC
  Administered 2019-10-10: 500 mL via INTRAVENOUS

## 2019-10-10 NOTE — ED Triage Notes (Signed)
To ED via [facility] from ENT office with c/o shakiness, weakness, "felt like I was going to pass out" was at ENT office for ear pain. On arrival- alert/oriented x 4, states has been urinating more than normal, had covid in November- has decreased appetite since-- has been changing BP meds recently-  Also c/o rash/itchy on right arm-- redness and dry skin noted, no hives

## 2019-10-10 NOTE — Discharge Instructions (Addendum)
As discussed, your evaluation today has been largely reassuring.  But, it is important that you monitor your condition carefully, and do not hesitate to return to the ED if you develop new, or concerning changes in your condition. ? ?Otherwise, please follow-up with your physician for appropriate ongoing care. ? ?

## 2019-10-10 NOTE — Progress Notes (Signed)
Pt verbalized understanding of DC instructions and follow up care. IV removed.

## 2019-10-10 NOTE — Patient Instructions (Addendum)
Medication Instructions:  Start Amlodipine 10 mg daily.  *If you need a refill on your cardiac medications before your next appointment, please call your pharmacy*  Lab Work: TSH, A1C today If you have labs (blood work) drawn today and your tests are completely normal, you will receive your results only by: Marland Kitchen MyChart Message (if you have MyChart) OR . A paper copy in the mail If you have any lab test that is abnormal or we need to change your treatment, we will call you to review the results.  Testing/Procedures: Echocardiogram - Your physician has requested that you have an echocardiogram. Echocardiography is a painless test that uses sound waves to create images of your heart. It provides your doctor with information about the size and shape of your heart and how well your heart's chambers and valves are working. This procedure takes approximately one hour. There are no restrictions for this procedure. This will be performed at our [facility] location - [address].  Your physician has requested that you have a lexiscan myoview. A cardiac stress test is a cardiological test that measures the heart's ability to respond to external stress in a controlled clinical environment. The stress response is induced by intravenous pharmacological stimulation.     Follow-Up: At [facility], you and your health needs are our priority.  As part of our continuing mission to provide you with exceptional heart care, we have created designated Provider Care Teams.  These Care Teams include your primary Cardiologist (physician) and Advanced Practice Providers (APPs -  Physician Assistants and Nurse Practitioners) who all work together to provide you with the care you need, when you need it.  Your next appointment:   1 month(s)  The format for your next appointment:   In Person  Provider:   Lennie Odor, MD

## 2019-10-10 NOTE — ED Provider Notes (Signed)
[facility] Provider Note   CSN: [id number] Arrival date & time: 10/10/19  1011     History Chief Complaint  Patient presents with  . Fatigue  . Hypertension    Susan Hogan is a 59 y.o. female.   Hypertension      Patient presents from ENT office with concern for near syncope, diarrhea, generalized discomfort, hyperglyce.now mia and hypertension. She acknowledges multiple medical problems, and was Covid positive within the past months. She notes that over the past month she has had ongoing bilateral ear discomfort, prompting today's ENT evaluation. That evaluation was generally unremarkable, but while there she was found to be hypertensive, with hyperglycemia and sent here for evaluation, with notation of the aforementioned complaints. Patient notes that she has generalized discomfort, and new bilateral flank pain.  Onset is unclear, but it seems to been present for some time, possibly weeks. No fever, though she feels warm subjectively. She notes that she has been taking her medication as directed, including losartan.  She had recent cessation of one of her antihypertensives.  Past Medical History:  Diagnosis Date  . Anemia   . Arthritis    RA "states does not see Rheumatologist"  . Asthma   . Carpal tunnel syndrome    bilaterally  . Complication of anesthesia    difficulty breathing  . COPD (chronic obstructive pulmonary disease) ([employer])   . COVID-19   . Degenerative disk disease    back  . Depression   . Diabetes mellitus   . Fibromyalgia   . GERD (gastroesophageal reflux disease)   . Headache(784.0)    migraines  . Heart murmur   . Hepatitis    In followup treatment for hepatitis C  . Hypertension    no medication for at this time, sees Dr. Dinah Beers Pcp  . Hypotensive episode    hx of  . Irritable bowel syndrome   . Kidney stone    with hematuria  . Memory loss   . Recurrent upper respiratory infection  (URI)   . Shortness of breath   . Syncope     Patient Active Problem List   Diagnosis Date Noted  . Chronic migraine without aura, with intractable migraine, so stated, with status migrainosus 03/01/2019  . Medication overuse headache 03/01/2019  . Seasonal allergic rhinitis due to pollen 03/25/2018  . Adnexal cyst 03/25/2018  . Morbid obesity ([place]) 03/25/2018  . Essential hypertension 03/25/2018  . Pure hypercholesterolemia 03/25/2018  . Sleep-disordered breathing 03/25/2018  . Vitamin D deficiency 03/25/2018  . Bilateral lower extremity edema 03/25/2018  . Acute meniscal tear of left knee 11/24/2016  . Chronic hepatitis C without hepatic coma ([place]) 11/24/2016  . Fatty liver 11/24/2016  . H/O degenerative disc disease 11/24/2016  . COPD (chronic obstructive pulmonary disease) ([place]) 10/22/2016  . Type 2 diabetes mellitus, cannot tolerate Metformin 07/25/2014  . Fibromyalgia 07/25/2014  . MDD (major depressive disorder), recurrent severe, without psychosis ([place]) 03/17/2013  . Hepatitis C 08/02/2007  . IBS (irritable bowel syndrome) 08/02/2007  . Mild persistent asthma with acute exacerbation 09/21/1962    Past Surgical History:  Procedure Laterality Date  . CHOLECYSTECTOMY  2019  . DILATION AND CURETTAGE OF UTERUS    . Left knee surgery    . LIVER BIOPSY    . TONSILLECTOMY    . TUBAL LIGATION       OB History   No obstetric history on file.     Family History  Problem  Relation Age of Onset  . Heart disease Mother   . Diabetes Mother   . Asthma Mother   . Anemia Mother   . Cerebral aneurysm Mother        ruptured  . Kidney failure Mother   . Migraines Mother   . Parkinson's disease Mother   . Colon cancer Maternal Uncle   . Heart disease Maternal Uncle   . Heart disease Maternal Grandmother   . Diabetes Maternal Grandmother   . Asthma Maternal Grandmother   . Heart Problems Maternal Grandmother   . Heart Problems Other        through family  . Diabetes  Granddaughter   . Diabetes Daughter     Social History   Tobacco Use  . Smoking status: Never Smoker  . Smokeless tobacco: Never Used  Substance Use Topics  . Alcohol use: Never    Alcohol/week: 0.0 standard drinks  . Drug use: Never    Home Medications Prior to Admission medications   Medication Sig Start Date End Date Taking? Authorizing Provider  albuterol (PROVENTIL HFA;VENTOLIN HFA) 108 (90 Base) MCG/ACT inhaler Inhale 1-2 puffs into the lungs every 6 (six) hours as needed for wheezing or shortness of breath. 03/03/18   Couture, Cortni S, PA-C  blood glucose meter kit and supplies KIT Dispense based on patient and insurance preference. Use up to four times daily as directed. (FOR ICD-9 250.00, 250.01). 03/25/18   Briscoe Deutscher, DO  fluticasone (FLONASE) 50 MCG/ACT nasal spray Place 2 sprays into both nostrils daily.    [provider]  glucose blood (ACCU-CHEK ACTIVE STRIPS) test strip Use as instructed 04/15/16   Nona Dell, PA-C  levofloxacin (LEVAQUIN) 750 MG tablet Take 750 mg by mouth daily. 09/25/19   [provider]  lisinopril-hydrochlorothiazide (ZESTORETIC) 10-12.5 MG tablet Take 1 tablet by mouth daily. 09/27/19   [provider]  potassium chloride SA (KLOR-CON) 20 MEQ tablet Take 1 tablet (20 mEq total) by mouth 2 (two) times daily. 10/01/19   Recardo Evangelist, PA-C  predniSONE (DELTASONE) 10 MG tablet Take 10 mg by mouth 2 (two) times daily. 09/25/19   [provider]  rizatriptan (MAXALT-MLT) 10 MG disintegrating tablet Take 1 tablet (10 mg total) by mouth as needed for migraine. May repeat in 2 hours if needed. Maximum 2x in one day. 03/16/19   Melvenia Beam, MD  triamcinolone (NASACORT) 55 MCG/ACT AERO nasal inhaler Place 2 sprays into the nose daily. 09/13/19 09/12/20  [provider]  cetirizine (ZYRTEC ALLERGY) 10 MG tablet Take 1 tablet (10 mg total) by mouth daily. 09/24/19 09/24/19  Petrucelli, Glynda Jaeger, PA-C    dicyclomine (BENTYL) 20 MG tablet Take 1 tablet (20 mg total) by mouth 2 (two) times daily. Patient not taking: Reported on 10/01/2019 08/19/19 10/01/19  Lorayne Bender, PA-C  diphenhydrAMINE (BENADRYL) 25 MG tablet Take 1 tablet (25 mg total) by mouth every 6 (six) hours as needed. Patient not taking: Reported on 10/01/2019 09/24/19 10/01/19  Petrucelli, Aldona Bar R, PA-C  escitalopram (LEXAPRO) 10 MG tablet TAKE 1 TABLET(10 MG) BY MOUTH DAILY Patient not taking: Reported on 07/05/2018 06/01/18 09/24/19  Briscoe Deutscher, DO  labetalol (NORMODYNE) 100 MG tablet Take 1 tablet (100 mg total) by mouth 2 (two) times daily. Patient not taking: Reported on 10/01/2019 08/30/19 10/01/19  Milton Ferguson, MD  prochlorperazine (COMPAZINE) 10 MG tablet Take 1 tablet (10 mg total) by mouth every 6 (six) hours as needed. May take with 78m of  benadryl for migraines.. Patient not taking: Reported on 10/01/2019 03/09/19 10/01/19  Melvenia Beam, MD  topiramate (TOPAMAX) 100 MG tablet Take 1 tablet (100 mg total) by mouth at bedtime. Patient not taking: Reported on 10/01/2019 03/09/19 10/01/19  Melvenia Beam, MD    Allergies    Anesthetic ether [ether], Nubain [nalbuphine hcl], Penicillins, Lisinopril, Advair diskus [fluticasone-salmeterol], and Lactose intolerance (gi)  Review of Systems   Review of Systems  Constitutional:       Per HPI, otherwise negative  HENT:       Per HPI, otherwise negative  Respiratory:       Per HPI, otherwise negative  Cardiovascular:       Per HPI, otherwise negative  Gastrointestinal: Positive for diarrhea. Negative for vomiting.  Endocrine:       Negative aside from HPI  Genitourinary:       Neg aside from HPI   Musculoskeletal:       Per HPI, otherwise negative  Skin: Negative.   Neurological: Positive for weakness and light-headedness. Negative for syncope.    Physical Exam Updated Vital Signs BP (!) 144/84 (BP Location: Right Arm)   Pulse 70   Temp 98.6 F (37 C) (Oral)    Resp 14   Ht 5' (1.524 m)   Wt 97.5 kg   LMP  (LMP Unknown)   SpO2 100%   BMI 41.99 kg/m   Physical Exam Vitals and nursing note reviewed.  Constitutional:      General: She is not in acute distress.    Appearance: She is well-developed.  HENT:     Head: Normocephalic and atraumatic.  Eyes:     Conjunctiva/sclera: Conjunctivae normal.  Cardiovascular:     Rate and Rhythm: Normal rate and regular rhythm.  Pulmonary:     Effort: Pulmonary effort is normal. No respiratory distress.     Breath sounds: Normal breath sounds. No stridor.  Abdominal:     General: There is no distension.  Skin:    General: Skin is warm and dry.     Findings: No rash.  Neurological:     Mental Status: She is alert and oriented to person, place, and time.     Cranial Nerves: No cranial nerve deficit.     ED Results / Procedures / Treatments   Labs (all labs ordered are listed, but only abnormal results are displayed) Labs Reviewed  COMPREHENSIVE METABOLIC PANEL - Abnormal; Notable for the following components:      Result Value   Potassium 3.4 (*)    Glucose, Bld 135 (*)    Total Protein 6.2 (*)    AST 92 (*)    ALT 101 (*)    All other components within normal limits  URINALYSIS, ROUTINE W REFLEX MICROSCOPIC - Abnormal; Notable for the following components:   Color, Urine STRAW (*)    Hgb urine dipstick MODERATE (*)    Leukocytes,Ua MODERATE (*)    All other components within normal limits  BRAIN NATRIURETIC PEPTIDE  CBC WITH DIFFERENTIAL/PLATELET    Cardiac rhythm on initial evaluation rate 70, sinus, unremarkable  EKG EKG Interpretation  Date/Time:  Tuesday October 10 2019 10:22:44 EST Ventricular Rate:  79 PR Interval:    QRS Duration: 93 QT Interval:  396 QTC Calculation: 454 R Axis:   66 Text Interpretation: Sinus rhythm Artifact Otherwise within normal limits Confirmed by Carmin Muskrat [phone]) on 10/10/2019 10:48:47 AM   Radiology No acute findings, images reviewed  by myself and radiology.  Procedures Procedures (including critical care time)  Medications Ordered in ED Medications  sodium chloride 0.9 % bolus 500 mL (0 mLs Intravenous Stopped 10/10/19 1316)  ketorolac (TORADOL) 30 MG/ML injection 15 mg (15 mg Intravenous Given 10/10/19 1105)    ED Course  I have reviewed the triage vital signs and the nursing notes.  Pertinent labs & imaging results that were available during my care of the patient were reviewed by me and considered in my medical decision making (see chart for details).    MDM Rules/Calculators/A&P                      2:37 PM On repeat exam patient is awake, alert, in no distress. We discussed all findings, she is in no distress, speaking clearly, vital signs notable for blood pressure 140/75, unremarkable. With her Covid infection earlier this season, there are some suspicion for the patient having a prolonged recovery given her reassuring labs, absence of focal neurologic deficiencies, distress, evidence for endorgan effects due to hypertensive urgency. A lengthy conversation about the need for close outpatient follow-up, particularly with consideration of her blood pressure medication regimen.  The patient amenable to follow-up and was discharged in stable condition.   Final Clinical Impression(s) / ED Diagnoses Final diagnoses:  Weakness  Hypertensive urgency     Carmin Muskrat, MD 10/10/19 1438

## 2019-10-11 LAB — TSH: TSH: 1.77 u[IU]/mL (ref 0.450–4.500)

## 2019-10-11 LAB — HEMOGLOBIN A1C
Est. average glucose Bld gHb Est-mCnc: 111 mg/dL
Hgb A1c MFr Bld: 5.5 % (ref 4.8–5.6)

## 2019-10-13 ENCOUNTER — Telehealth (HOSPITAL_COMMUNITY): Payer: Self-pay

## 2019-10-13 NOTE — Telephone Encounter (Signed)
Encounter complete. 

## 2019-10-14 ENCOUNTER — Other Ambulatory Visit: Payer: Self-pay

## 2019-10-14 ENCOUNTER — Emergency Department (HOSPITAL_COMMUNITY)
Admission: EM | Admit: 2019-10-14 | Discharge: 2019-10-14 | Disposition: A | Discharge status: Home / Self Care | Payer: Medicaid Other | Attending: Emergency Medicine | Admitting: Emergency Medicine

## 2019-10-14 ENCOUNTER — Encounter (HOSPITAL_COMMUNITY): Payer: Self-pay | Admitting: Emergency Medicine

## 2019-10-14 DIAGNOSIS — E119 Type 2 diabetes mellitus without complications: Secondary | ICD-10-CM | POA: Diagnosis not present

## 2019-10-14 DIAGNOSIS — R531 Weakness: Secondary | ICD-10-CM | POA: Insufficient documentation

## 2019-10-14 DIAGNOSIS — Z8616 Personal history of COVID-19: Secondary | ICD-10-CM | POA: Insufficient documentation

## 2019-10-14 DIAGNOSIS — M797 Fibromyalgia: Secondary | ICD-10-CM | POA: Diagnosis not present

## 2019-10-14 DIAGNOSIS — I10 Essential (primary) hypertension: Secondary | ICD-10-CM | POA: Insufficient documentation

## 2019-10-14 DIAGNOSIS — M546 Pain in thoracic spine: Secondary | ICD-10-CM | POA: Insufficient documentation

## 2019-10-14 DIAGNOSIS — Z7984 Long term (current) use of oral hypoglycemic drugs: Secondary | ICD-10-CM | POA: Insufficient documentation

## 2019-10-14 LAB — URINALYSIS, ROUTINE W REFLEX MICROSCOPIC
Bacteria, UA: NONE SEEN
Bilirubin Urine: NEGATIVE
Glucose, UA: NEGATIVE mg/dL
Ketones, ur: NEGATIVE mg/dL
Nitrite: NEGATIVE
Protein, ur: NEGATIVE mg/dL
Specific Gravity, Urine: 1.01 (ref 1.005–1.030)
pH: 6 (ref 5.0–8.0)

## 2019-10-14 LAB — CBC WITH DIFFERENTIAL/PLATELET
Abs Immature Granulocytes: 0.02 10*3/uL (ref 0.00–0.07)
Basophils Absolute: 0.1 10*3/uL (ref 0.0–0.1)
Basophils Relative: 1 %
Eosinophils Absolute: 0.1 10*3/uL (ref 0.0–0.5)
Eosinophils Relative: 1 %
HCT: 42.5 % (ref 36.0–46.0)
Hemoglobin: 14.1 g/dL (ref 12.0–15.0)
Immature Granulocytes: 0 %
Lymphocytes Relative: 31 %
Lymphs Abs: 2.8 10*3/uL (ref 0.7–4.0)
MCH: 33.3 pg (ref 26.0–34.0)
MCHC: 33.2 g/dL (ref 30.0–36.0)
MCV: 100.2 fL — ABNORMAL HIGH (ref 80.0–100.0)
Monocytes Absolute: 0.6 10*3/uL (ref 0.1–1.0)
Monocytes Relative: 7 %
Neutro Abs: 5.4 10*3/uL (ref 1.7–7.7)
Neutrophils Relative %: 60 %
Platelets: 177 10*3/uL (ref 150–400)
RBC: 4.24 MIL/uL (ref 3.87–5.11)
RDW: 13.2 % (ref 11.5–15.5)
WBC: 9 10*3/uL (ref 4.0–10.5)
nRBC: 0 % (ref 0.0–0.2)

## 2019-10-14 LAB — BASIC METABOLIC PANEL
Anion gap: 12 (ref 5–15)
BUN: 14 mg/dL (ref 6–20)
CO2: 24 mmol/L (ref 22–32)
Calcium: 9.4 mg/dL (ref 8.9–10.3)
Chloride: 105 mmol/L (ref 98–111)
Creatinine, Ser: 0.72 mg/dL (ref 0.44–1.00)
GFR calc Af Amer: >60 mL/min (ref >60)
GFR calc non Af Amer: >60 mL/min (ref >60)
Glucose, Bld: 140 mg/dL — ABNORMAL HIGH (ref 70–99)
Potassium: 3.4 mmol/L — ABNORMAL LOW (ref 3.5–5.1)
Sodium: 141 mmol/L (ref 135–145)

## 2019-10-14 MED ORDER — ACETAMINOPHEN 500 MG PO TABS
1000.0000 mg | ORAL_TABLET | Freq: Once | ORAL | Status: AC
  Administered 2019-10-14: 1000 mg via ORAL
  Filled 2019-10-14: qty 2

## 2019-10-14 MED ORDER — METHOCARBAMOL 500 MG PO TABS: 500.0000 mg | ORAL_TABLET | Freq: Three times a day (TID) | ORAL | 0 refills | Status: DC | PRN

## 2019-10-14 MED ORDER — METHOCARBAMOL 1000 MG/10ML IJ SOLN
1000.0000 mg | Freq: Once | INTRAMUSCULAR | Status: AC
  Administered 2019-10-14: 05:00:00 1000 mg via INTRAMUSCULAR
  Filled 2019-10-14 (×2): qty 10

## 2019-10-14 NOTE — ED Triage Notes (Signed)
Patient arrived with EMS from home reports elevated blood pressure at home this evening BP= 200/108, patient added mid/low back pain with fatigue and lightheaded today .

## 2019-10-14 NOTE — ED Provider Notes (Signed)
Emergency Department Provider Note   I have reviewed the triage vital signs and the nursing notes.   HISTORY  Chief Complaint Hypertension   HPI Susan Hogan is a 59 y.o. female multiple medical problems documented below who presents to the emergency department today with hypertension.  Patient states that she felt weak and had back pain and pain (which is chronic) so she checked her blood pressure and it was 190s systolic so she got up and walked around for about 20 minutes and checked it again it was over 200 so she called EMS.  By time EMS got there her blood pressure was in the 180s she presents here for further evaluation.  No headache, blurry vision, chest pain, shortness of breath or new back pain.  No other associated symptoms.  She does state that she has some increased urinary frequency over the last week.  No dysuria or malodorous urine.  No nausea or vomiting.  No fevers.  No diarrhea or constipation.  No rashes.  No trauma.  States compliance with her medications amlodipine and losartan.   No other associated or modifying symptoms.    Past Medical History:  Diagnosis Date  . Anemia   . Arthritis    RA "states does not see Rheumatologist"  . Asthma   . Carpal tunnel syndrome    bilaterally  . Complication of anesthesia    difficulty breathing  . COPD (chronic obstructive pulmonary disease) ([facility])   . COVID-19   . Degenerative disk disease    back  . Depression   . Diabetes mellitus   . Fibromyalgia   . GERD (gastroesophageal reflux disease)   . Headache(784.0)    migraines  . Heart murmur   . Hepatitis    In followup treatment for hepatitis C  . Hypertension    no medication for at this time, sees Dr. Estevan Oaks Pcp  . Hypotensive episode    hx of  . Irritable bowel syndrome   . Kidney stone    with hematuria  . Memory loss   . Recurrent upper respiratory infection (URI)   . Shortness of breath   . Syncope     Patient Active Problem List   Diagnosis Date Noted  . Chronic migraine without aura, with intractable migraine, so stated, with status migrainosus 03/01/2019  . Medication overuse headache 03/01/2019  . Seasonal allergic rhinitis due to pollen 03/25/2018  . Adnexal cyst 03/25/2018  . Morbid obesity ([facility]) 03/25/2018  . Essential hypertension 03/25/2018  . Pure hypercholesterolemia 03/25/2018  . Sleep-disordered breathing 03/25/2018  . Vitamin D deficiency 03/25/2018  . Bilateral lower extremity edema 03/25/2018  . Acute meniscal tear of left knee 11/24/2016  . Chronic hepatitis C without hepatic coma ([facility]) 11/24/2016  . Fatty liver 11/24/2016  . H/O degenerative disc disease 11/24/2016  . COPD (chronic obstructive pulmonary disease) ([facility]) 10/22/2016  . Type 2 diabetes mellitus, cannot tolerate Metformin 07/25/2014  . Fibromyalgia 07/25/2014  . MDD (major depressive disorder), recurrent severe, without psychosis ([facility]) 03/17/2013  . Hepatitis C 08/02/2007  . IBS (irritable bowel syndrome) 08/02/2007  . Mild persistent asthma with acute exacerbation 09/21/1962    Past Surgical History:  Procedure Laterality Date  . CHOLECYSTECTOMY  2019  . DILATION AND CURETTAGE OF UTERUS    . Left knee surgery    . LIVER BIOPSY    . TONSILLECTOMY    . TUBAL LIGATION      Current Outpatient Rx  . Order #: [id number] Class: Print  .  Order #: [id number] Class: Normal  . Order #: [id number] Class: Historical Med  . Order #: [id number] Class: Historical Med  . Order #: [id number] Class: Print  . Order #: [id number] Class: Print  . Order #: [id number] Class: Normal    Allergies Anesthetic ether [ether], Nubain [nalbuphine hcl], Penicillins, Lisinopril, Advair diskus [fluticasone-salmeterol], and Lactose intolerance (gi)  Family History  Problem Relation Age of Onset  . Heart disease Mother   . Diabetes Mother   . Asthma Mother   . Anemia Mother   . Cerebral aneurysm Mother        ruptured  . Kidney failure Mother   . Migraines  Mother   . Parkinson's disease Mother   . Colon cancer Maternal Uncle   . Heart disease Maternal Uncle   . Heart disease Maternal Grandmother   . Diabetes Maternal Grandmother   . Asthma Maternal Grandmother   . Heart Problems Maternal Grandmother   . Heart Problems Other        through family  . Diabetes Granddaughter   . Diabetes Daughter     Social History Social History   Tobacco Use  . Smoking status: Never Smoker  . Smokeless tobacco: Never Used  Substance Use Topics  . Alcohol use: Never    Alcohol/week: 0.0 standard drinks  . Drug use: Never    Review of Systems  All other systems negative except as documented in the HPI. All pertinent positives and negatives as reviewed in the HPI. ____________________________________________   PHYSICAL EXAM:  VITAL SIGNS: ED Triage Vitals  Enc Vitals Group     BP 10/14/19 0214 (!) 153/87     Pulse Rate 10/14/19 0214 73     Resp 10/14/19 0214 20     Temp 10/14/19 0214 98.2 F (36.8 C)     Temp Source 10/14/19 0214 Oral     SpO2 10/14/19 0214 99 %    Constitutional: Alert and oriented. Well appearing and in no acute distress. Eyes: Conjunctivae are normal. PERRL. EOMI. Head: Atraumatic. Nose: No congestion/rhinnorhea. Mouth/Throat: Mucous membranes are moist.  Oropharynx non-erythematous. Neck: No stridor.  No meningeal signs.   Cardiovascular: Normal rate, regular rhythm. Good peripheral circulation. Grossly normal heart sounds.   Respiratory: Normal respiratory effort.  No retractions. Lungs CTAB. Gastrointestinal: Soft and nontender. No distention.  Musculoskeletal: ttp to left thoracic paraspinal area, worse with ROM. Neurologic:  Normal speech and language. No gross focal neurologic deficits are appreciated.  Skin:  Skin is warm, dry and intact. No rash noted.  ____________________________________________   LABS (all labs ordered are listed, but only abnormal results are displayed)  Labs Reviewed  CBC WITH  DIFFERENTIAL/PLATELET - Abnormal; Notable for the following components:      Result Value   MCV 100.2 (*)    All other components within normal limits  BASIC METABOLIC PANEL - Abnormal; Notable for the following components:   Potassium 3.4 (*)    Glucose, Bld 140 (*)    All other components within normal limits  URINALYSIS, ROUTINE W REFLEX MICROSCOPIC - Abnormal; Notable for the following components:   Hgb urine dipstick MODERATE (*)    Leukocytes,Ua SMALL (*)    All other components within normal limits  URINE CULTURE   ____________________________________________   INITIAL IMPRESSION / ASSESSMENT AND PLAN / ED COURSE  Low suspicion for hypertensive crisis at this time.  Her blood pressure is 126/87 without intervention.  We will check a urine to make sure she does have UTI/pyelonephritis.  Supportive care otherwise as the  pain in her back seems to be muscular.   Pertinent labs & imaging results that were available during my care of the patient were reviewed by me and considered in my medical decision making (see chart for details).  A medical screening exam was performed and I feel the patient has had an appropriate workup for their chief complaint at this time and likelihood of emergent condition existing is low. They have been counseled on decision, discharge, follow up and which symptoms necessitate immediate return to the emergency department. They or their family verbally stated understanding and agreement with plan and discharged in stable condition.   ____________________________________________  FINAL CLINICAL IMPRESSION(S) / ED DIAGNOSES  Final diagnoses:  Hypertension, unspecified type  Acute left-sided thoracic back pain    MEDICATIONS GIVEN DURING THIS VISIT:  Medications  acetaminophen (TYLENOL) tablet 1,000 mg (1,000 mg Oral Given 10/14/19 0445)  methocarbamol (ROBAXIN) injection 1,000 mg (1,000 mg Intramuscular Given 10/14/19 0445)     NEW OUTPATIENT  MEDICATIONS STARTED DURING THIS VISIT:  Discharge Medication List as of 10/14/2019  5:47 AM    START taking these medications   Details  methocarbamol (ROBAXIN) 500 MG tablet Take 1 tablet (500 mg total) by mouth every 8 (eight) hours as needed for muscle spasms (back pain)., Starting Sat 10/14/2019, Normal        Note:  This note was prepared with assistance of Dragon voice recognition software. Occasional wrong-word or sound-a-like substitutions may have occurred due to the inherent limitations of voice recognition software.   Bich Mchaney, Corene Cornea, MD 10/14/19 [phone]

## 2019-10-15 ENCOUNTER — Encounter (HOSPITAL_COMMUNITY): Payer: Self-pay | Admitting: Emergency Medicine

## 2019-10-15 ENCOUNTER — Emergency Department (HOSPITAL_COMMUNITY)
Admission: EM | Admit: 2019-10-15 | Discharge: 2019-10-15 | Disposition: A | Discharge status: Home / Self Care | Payer: Medicaid Other | Attending: Emergency Medicine | Admitting: Emergency Medicine

## 2019-10-15 ENCOUNTER — Other Ambulatory Visit: Payer: Self-pay

## 2019-10-15 DIAGNOSIS — I10 Essential (primary) hypertension: Secondary | ICD-10-CM | POA: Diagnosis present

## 2019-10-15 LAB — BASIC METABOLIC PANEL
Anion gap: 9 (ref 5–15)
BUN: 12 mg/dL (ref 6–20)
CO2: 26 mmol/L (ref 22–32)
Calcium: 9.4 mg/dL (ref 8.9–10.3)
Chloride: 107 mmol/L (ref 98–111)
Creatinine, Ser: 0.66 mg/dL (ref 0.44–1.00)
GFR calc Af Amer: >60 mL/min (ref >60)
GFR calc non Af Amer: >60 mL/min (ref >60)
Glucose, Bld: 117 mg/dL — ABNORMAL HIGH (ref 70–99)
Potassium: 5.3 mmol/L — ABNORMAL HIGH (ref 3.5–5.1)
Sodium: 142 mmol/L (ref 135–145)

## 2019-10-15 LAB — URINALYSIS, ROUTINE W REFLEX MICROSCOPIC
Bilirubin Urine: NEGATIVE
Glucose, UA: NEGATIVE mg/dL
Ketones, ur: NEGATIVE mg/dL
Leukocytes,Ua: NEGATIVE
Nitrite: NEGATIVE
Protein, ur: NEGATIVE mg/dL
Specific Gravity, Urine: 1.004 — ABNORMAL LOW (ref 1.005–1.030)
pH: 5 (ref 5.0–8.0)

## 2019-10-15 LAB — URINE CULTURE: Culture: <10000 — AB

## 2019-10-15 LAB — CBG MONITORING, ED: Glucose-Capillary: 120 mg/dL — ABNORMAL HIGH (ref 70–99)

## 2019-10-15 LAB — CBC
HCT: 41.6 % (ref 36.0–46.0)
Hemoglobin: 14.1 g/dL (ref 12.0–15.0)
MCH: 33.4 pg (ref 26.0–34.0)
MCHC: 33.9 g/dL (ref 30.0–36.0)
MCV: 98.6 fL (ref 80.0–100.0)
Platelets: 147 10*3/uL — ABNORMAL LOW (ref 150–400)
RBC: 4.22 MIL/uL (ref 3.87–5.11)
RDW: 13.3 % (ref 11.5–15.5)
WBC: 8.3 10*3/uL (ref 4.0–10.5)
nRBC: 0 % (ref 0.0–0.2)

## 2019-10-15 LAB — MAGNESIUM: Magnesium: 2 mg/dL (ref 1.7–2.4)

## 2019-10-15 MED ORDER — ACETAMINOPHEN 325 MG PO TABS
650.0000 mg | ORAL_TABLET | Freq: Once | ORAL | Status: AC
  Administered 2019-10-15: 20:00:00 650 mg via ORAL
  Filled 2019-10-15: qty 2

## 2019-10-15 MED ORDER — METHOCARBAMOL 500 MG PO TABS
500.0000 mg | ORAL_TABLET | Freq: Once | ORAL | Status: AC
  Administered 2019-10-15: 500 mg via ORAL
  Filled 2019-10-15: qty 1

## 2019-10-15 NOTE — ED Notes (Signed)
Medications given and charted per MAR. Tolerated well.  

## 2019-10-15 NOTE — ED Notes (Signed)
Urine collected, labeled, and sent to lab

## 2019-10-15 NOTE — ED Triage Notes (Signed)
Pt states she was seen at [facility] and given clonidine for HTN. Endorses exhaustion and SOB when she feels her BP elevate.

## 2019-10-15 NOTE — Discharge Instructions (Addendum)
It is very important that there is only 1 person managing your blood pressure. Return to the emergency room with any new, worsening, concerning symptoms.  Your primary care doctor most recently recommended you take metoprolol, losartan, and amlodipine for blood pressure. Take your potassium as prescribed.

## 2019-10-15 NOTE — ED Notes (Addendum)
Pt states that her BP has been over 200 systolic at times, was prescribed a new medication at [facility] but experienced an episode of dizziness and sob today while out shopping. Endores HA and ear pain.   Pt was also diagnosed with a UTI yesterday "but they didn't give me anything for it"  BP is 147/85 in exam room

## 2019-10-15 NOTE — ED Provider Notes (Signed)
[facility] EMERGENCY DEPARTMENT Provider Note   CSN: [id number] Arrival date & time: 10/15/19  1633     History Chief Complaint  Patient presents with   Hypertension    Susan Hogan is a 59 y.o. female presenting for concerns of her blood pressure.  Patient states that the past several months, she has had a difficult time controlling her blood pressure.  Patient states it has been fluctuating between normal and readings with a systolic in the 741U.  Patient states there has been a lot of medication changes recently with her blood pressure medicines.  Most recently she was told to take lisinopril and metoprolol, both of which she took this morning.  Previously, they had switched her lisinopril for losartan, but that was changed today.  Additionally, patient had been told to take amlodipine per her cardiologist last week, patient states she had 1 dose before being told to discontinue it today.  She does not know why.  Patient states she was given a dose of clonidine at [facility] yesterday, which is where she was also evaluated today, but did not receive any antihypertensives.  This is where most recent blood pressure medication changes have occurred. Patient states she was told that she has a UTI, but was not given any antibiotics.  She reports urinary frequency, but no dysuria or hematuria.  She states earlier today she was on the streets when she closed her eyes mid conversation and her daughter became concerned.  Patient states that after doing this she checked her blood pressure and it was 384 systolic.  She has not taken anything for her blood pressure since this.  Patient states she is having a mild headache, this is not new for her.  She also reports midthoracic back pain, for she is seen in the ER yesterday.  She was given Robaxin, but has not been able to afford the prescription.  She denies fevers, chills, vision changes, slurred speech, numbness, tingling,  chest pain, shortness of breath, cough, nausea, vomiting, domino pain, loss of bowel bladder control.    HPI     Past Medical History:  Diagnosis Date   Anemia    Arthritis    RA "states does not see Rheumatologist"   Asthma    Carpal tunnel syndrome    bilaterally   Complication of anesthesia    difficulty breathing   COPD (chronic obstructive pulmonary disease) (HCC)    COVID-19    Degenerative disk disease    back   Depression    Diabetes mellitus    Fibromyalgia    GERD (gastroesophageal reflux disease)    Headache(784.0)    migraines   Heart murmur    Hepatitis    In followup treatment for hepatitis C   Hypertension    no medication for at this time, sees Dr. Dinah Beers Pcp   Hypotensive episode    hx of   Irritable bowel syndrome    Kidney stone    with hematuria   Memory loss    Recurrent upper respiratory infection (URI)    Shortness of breath    Syncope     Patient Active Problem List   Diagnosis Date Noted   Chronic migraine without aura, with intractable migraine, so stated, with status migrainosus 03/01/2019   Medication overuse headache 03/01/2019   Seasonal allergic rhinitis due to pollen 03/25/2018   Adnexal cyst 03/25/2018   Morbid obesity ([place]) 03/25/2018   Essential hypertension 03/25/2018   Pure  hypercholesterolemia 03/25/2018   Sleep-disordered breathing 03/25/2018   Vitamin D deficiency 03/25/2018   Bilateral lower extremity edema 03/25/2018   Acute meniscal tear of left knee 11/24/2016   Chronic hepatitis C without hepatic coma ([place]) 11/24/2016   Fatty liver 11/24/2016   H/O degenerative disc disease 11/24/2016   COPD (chronic obstructive pulmonary disease) ([place]) 10/22/2016   Type 2 diabetes mellitus, cannot tolerate Metformin 07/25/2014   Fibromyalgia 07/25/2014   MDD (major depressive disorder), recurrent severe, without psychosis ([place]) 03/17/2013   Hepatitis C 08/02/2007   IBS  (irritable bowel syndrome) 08/02/2007   Mild persistent asthma with acute exacerbation 09/21/1962    Past Surgical History:  Procedure Laterality Date   CHOLECYSTECTOMY  2019   DILATION AND CURETTAGE OF UTERUS     Left knee surgery     LIVER BIOPSY     TONSILLECTOMY     TUBAL LIGATION       OB History   No obstetric history on file.     Family History  Problem Relation Age of Onset   Heart disease Mother    Diabetes Mother    Asthma Mother    Anemia Mother    Cerebral aneurysm Mother        ruptured   Kidney failure Mother    Migraines Mother    Parkinson's disease Mother    Colon cancer Maternal Uncle    Heart disease Maternal Uncle    Heart disease Maternal Grandmother    Diabetes Maternal Grandmother    Asthma Maternal Grandmother    Heart Problems Maternal Grandmother    Heart Problems Other        through family   Diabetes Granddaughter    Diabetes Daughter     Social History   Tobacco Use   Smoking status: Never Smoker   Smokeless tobacco: Never Used  Substance Use Topics   Alcohol use: Never    Alcohol/week: 0.0 standard drinks   Drug use: Never    Home Medications Prior to Admission medications   Medication Sig Start Date End Date Taking? Authorizing Provider  albuterol (PROVENTIL HFA;VENTOLIN HFA) 108 (90 Base) MCG/ACT inhaler Inhale 1-2 puffs into the lungs every 6 (six) hours as needed for wheezing or shortness of breath. 03/03/18   Couture, Cortni S, PA-C  amLODipine (NORVASC) 10 MG tablet Take 1 tablet (10 mg total) by mouth daily. 10/10/19 01/08/20  O'Neal, Cassie Freer, MD  blood glucose meter kit and supplies KIT Dispense based on patient and insurance preference. Use up to four times daily as directed. (FOR ICD-9 250.00, 250.01). 03/25/18   Briscoe Deutscher, DO  glucose blood (ACCU-CHEK ACTIVE STRIPS) test strip Use as instructed 04/15/16   Nona Dell, PA-C  losartan (COZAAR) 50 MG tablet Take 50 mg by  mouth daily. 10/11/19   [provider]  methocarbamol (ROBAXIN) 500 MG tablet Take 1 tablet (500 mg total) by mouth every 8 (eight) hours as needed for muscle spasms (back pain). 10/14/19   Mesner, Corene Cornea, MD  potassium chloride SA (KLOR-CON) 20 MEQ tablet Take 20 mEq by mouth daily. 10/11/19   [provider]  cetirizine (ZYRTEC ALLERGY) 10 MG tablet Take 1 tablet (10 mg total) by mouth daily. 09/24/19 10/10/19  Petrucelli, Glynda Jaeger, PA-C  dicyclomine (BENTYL) 20 MG tablet Take 1 tablet (20 mg total) by mouth 2 (two) times daily. Patient not taking: Reported on 10/01/2019 08/19/19 10/01/19  Lorayne Bender, PA-C  diphenhydrAMINE (BENADRYL) 25 MG tablet Take 1 tablet (25  mg total) by mouth every 6 (six) hours as needed. Patient not taking: Reported on 10/01/2019 09/24/19 10/01/19  Petrucelli, Aldona Bar R, PA-C  escitalopram (LEXAPRO) 10 MG tablet TAKE 1 TABLET(10 MG) BY MOUTH DAILY Patient not taking: Reported on 07/05/2018 06/01/18 09/24/19  Briscoe Deutscher, DO  labetalol (NORMODYNE) 100 MG tablet Take 1 tablet (100 mg total) by mouth 2 (two) times daily. Patient not taking: Reported on 10/01/2019 08/30/19 10/01/19  Milton Ferguson, MD  prochlorperazine (COMPAZINE) 10 MG tablet Take 1 tablet (10 mg total) by mouth every 6 (six) hours as needed. May take with '25mg'$  of benadryl for migraines.. Patient not taking: Reported on 10/01/2019 03/09/19 10/01/19  Melvenia Beam, MD  rizatriptan (MAXALT-MLT) 10 MG disintegrating tablet Take 1 tablet (10 mg total) by mouth as needed for migraine. May repeat in 2 hours if needed. Maximum 2x in one day. Patient not taking: Reported on 10/14/2019 03/16/19 10/14/19  Melvenia Beam, MD  topiramate (TOPAMAX) 100 MG tablet Take 1 tablet (100 mg total) by mouth at bedtime. Patient not taking: Reported on 10/01/2019 03/09/19 10/01/19  Melvenia Beam, MD    Allergies    Anesthetic ether [ether], Nubain [nalbuphine hcl], Penicillins, Lisinopril, Advair diskus  [fluticasone-salmeterol], and Lactose intolerance (gi)  Review of Systems   Review of Systems  Musculoskeletal: Positive for back pain.  Neurological: Positive for headaches.  All other systems reviewed and are negative.   Physical Exam Updated Vital Signs BP 138/74    Pulse 62    Temp 98.1 F (36.7 C) (Oral)    Resp 19    Ht 5' (1.524 m)    Wt 97.5 kg    LMP  (LMP Unknown)    SpO2 90%    BMI 41.99 kg/m   Physical Exam Vitals and nursing note reviewed.  Constitutional:      General: She is not in acute distress.    Appearance: She is well-developed.     Comments: Appears anxious and stressed, but nontoxic  HENT:     Head: Normocephalic and atraumatic.  Eyes:     Extraocular Movements: Extraocular movements intact.     Conjunctiva/sclera: Conjunctivae normal.     Pupils: Pupils are equal, round, and reactive to light.     Comments: EOMI and PERRLA.  No nystagmus.  Cardiovascular:     Rate and Rhythm: Normal rate and regular rhythm.     Pulses: Normal pulses.  Pulmonary:     Effort: Pulmonary effort is normal. No respiratory distress.     Breath sounds: Normal breath sounds. No wheezing.     Comments: Clear lung sounds in all fields Abdominal:     General: There is no distension.     Palpations: Abdomen is soft. There is no mass.     Tenderness: There is no abdominal tenderness. There is no guarding or rebound.  Musculoskeletal:        General: Normal range of motion.     Cervical back: Normal range of motion and neck supple.     Right lower leg: No edema.     Left lower leg: No edema.  Skin:    General: Skin is warm and dry.     Capillary Refill: Capillary refill takes less than 2 seconds.  Neurological:     Mental Status: She is alert and oriented to person, place, and time.     GCS: GCS eye subscore is 4. GCS verbal subscore is 5. GCS motor subscore is 6.  Cranial Nerves: Cranial nerves are intact.     Sensory: Sensation is intact.     Motor: Motor function is  intact.     Gait: Gait is intact.  Psychiatric:        Mood and Affect: Mood is anxious. Affect is tearful.     ED Results / Procedures / Treatments   Labs (all labs ordered are listed, but only abnormal results are displayed) Labs Reviewed  CBC - Abnormal; Notable for the following components:      Result Value   Platelets 147 (*)    All other components within normal limits  BASIC METABOLIC PANEL - Abnormal; Notable for the following components:   Potassium 5.3 (*)    Glucose, Bld 117 (*)    All other components within normal limits  URINALYSIS, ROUTINE W REFLEX MICROSCOPIC - Abnormal; Notable for the following components:   Color, Urine STRAW (*)    Specific Gravity, Urine 1.004 (*)    Hgb urine dipstick SMALL (*)    Bacteria, UA RARE (*)    All other components within normal limits  CBG MONITORING, ED - Abnormal; Notable for the following components:   Glucose-Capillary 120 (*)    All other components within normal limits  MAGNESIUM    EKG None  Radiology No results found.  Procedures Procedures (including critical care time)  Medications Ordered in ED Medications  acetaminophen (TYLENOL) tablet 650 mg (has no administration in time range)  methocarbamol (ROBAXIN) tablet 500 mg (500 mg Oral Given 10/15/19 1844)    ED Course  I have reviewed the triage vital signs and the nursing notes.  Pertinent labs & imaging results that were available during my care of the patient were reviewed by me and considered in my medical decision making (see chart for details).    MDM Rules/Calculators/A&P                      Patient presenting for evaluation of hypertension.  Physical exam shows patient appears nontoxic.  She is not hypertensive in the ED.  This has resolved without medication.  She has been seen frequently at her primary care, urgent care, ER, and cardiologist for the same.  She has had frequent medication changes, which is complicating the picture.  While she  currently has a headache, she is no longer hypertensive I do not believe her headache is caused from hypertension or sign of hypertensive emergency.  Patient states she has had electrolyte abnormalities, as such we will obtain BMP and mag.  Due to this vague history of possible UTI and urinary frequency, will obtain urine.  Patient will likely be able to be discharged with reassurance.  I discussed that is typical for blood pressure to fluctuate, and the importance of checking her blood pressure when she is feeling well, not ill.  Discussed importance of having 1 person manage her blood pressure regimen.  Labs overall reassuring.  Potassium is hemolyzed, thus not an accurate.  However patient has had labs drawn multiple times over the past 2 weeks, potassium has steadily been between 3.3 and 3.4.  As such, will not wait for redraw.  Urine without obvious infection.    Patient's blood pressures remained stable in the ER.  On my evaluation, blood pressure is 138/74.  Discussed findings with patient.  Discussed continued monitoring, taking her blood pressure medication as prescribed, and following up with primary care.  At this time, patient appears safe for discharge.  Return  precautions given.  Patient states she understands and agrees to plan.   Final Clinical Impression(s) / ED Diagnoses Final diagnoses:  Hypertension, unspecified type    Rx / DC Orders ED Discharge Orders    None       Erick Alley 10/15/19 2014    Davonna Belling, MD 10/15/19 2045

## 2019-10-18 ENCOUNTER — Ambulatory Visit (HOSPITAL_COMMUNITY)
Admission: RE | Admit: 2019-10-18 | Discharge: 2019-10-18 | Disposition: A | Discharge status: Home / Self Care | Payer: Medicaid Other | Source: Ambulatory Visit | Attending: Cardiology | Admitting: Cardiology

## 2019-10-18 ENCOUNTER — Other Ambulatory Visit: Payer: Self-pay

## 2019-10-18 DIAGNOSIS — R079 Chest pain, unspecified: Secondary | ICD-10-CM | POA: Diagnosis present

## 2019-10-18 LAB — MYOCARDIAL PERFUSION IMAGING
LV dias vol: 60 mL (ref 46–106)
LV sys vol: 18 mL
Peak HR: 88 {beats}/min
Rest HR: 64 {beats}/min
SDS: 3
SRS: 3
SSS: 6
TID: 0.97

## 2019-10-18 MED ORDER — TECHNETIUM TC 99M TETROFOSMIN IV KIT
30.7000 | PACK | Freq: Once | INTRAVENOUS | Status: AC | PRN
  Administered 2019-10-18: 30.7 via INTRAVENOUS
  Filled 2019-10-18: qty 31

## 2019-10-18 MED ORDER — REGADENOSON 0.4 MG/5ML IV SOLN
0.4000 mg | Freq: Once | INTRAVENOUS | Status: AC
  Administered 2019-10-18: 0.4 mg via INTRAVENOUS

## 2019-10-18 MED ORDER — TECHNETIUM TC 99M TETROFOSMIN IV KIT
9.8000 | PACK | Freq: Once | INTRAVENOUS | Status: AC | PRN
  Administered 2019-10-18: 9.8 via INTRAVENOUS
  Filled 2019-10-18: qty 10

## 2019-10-18 MED ORDER — AMINOPHYLLINE 25 MG/ML IV SOLN
75.0000 mg | Freq: Once | INTRAVENOUS | Status: AC
  Administered 2019-10-18: 75 mg via INTRAVENOUS

## 2019-10-19 ENCOUNTER — Ambulatory Visit (HOSPITAL_COMMUNITY): Payer: Medicaid Other | Attending: Cardiology

## 2019-10-19 DIAGNOSIS — R0602 Shortness of breath: Secondary | ICD-10-CM | POA: Diagnosis present

## 2019-10-23 ENCOUNTER — Other Ambulatory Visit: Payer: Self-pay

## 2019-10-23 ENCOUNTER — Encounter (HOSPITAL_COMMUNITY): Payer: Self-pay | Admitting: Emergency Medicine

## 2019-10-23 ENCOUNTER — Emergency Department (HOSPITAL_COMMUNITY)
Admission: EM | Admit: 2019-10-23 | Discharge: 2019-10-23 | Disposition: A | Discharge status: Home / Self Care | Payer: Medicaid Other | Attending: Emergency Medicine | Admitting: Emergency Medicine

## 2019-10-23 DIAGNOSIS — R350 Frequency of micturition: Secondary | ICD-10-CM | POA: Insufficient documentation

## 2019-10-23 DIAGNOSIS — Z8616 Personal history of COVID-19: Secondary | ICD-10-CM | POA: Diagnosis not present

## 2019-10-23 DIAGNOSIS — R8290 Unspecified abnormal findings in urine: Secondary | ICD-10-CM

## 2019-10-23 DIAGNOSIS — I10 Essential (primary) hypertension: Secondary | ICD-10-CM | POA: Insufficient documentation

## 2019-10-23 DIAGNOSIS — R82998 Other abnormal findings in urine: Secondary | ICD-10-CM | POA: Diagnosis not present

## 2019-10-23 DIAGNOSIS — M797 Fibromyalgia: Secondary | ICD-10-CM | POA: Diagnosis not present

## 2019-10-23 DIAGNOSIS — Z7984 Long term (current) use of oral hypoglycemic drugs: Secondary | ICD-10-CM | POA: Diagnosis not present

## 2019-10-23 DIAGNOSIS — R030 Elevated blood-pressure reading, without diagnosis of hypertension: Secondary | ICD-10-CM

## 2019-10-23 DIAGNOSIS — M546 Pain in thoracic spine: Secondary | ICD-10-CM | POA: Diagnosis present

## 2019-10-23 DIAGNOSIS — M549 Dorsalgia, unspecified: Secondary | ICD-10-CM

## 2019-10-23 DIAGNOSIS — E119 Type 2 diabetes mellitus without complications: Secondary | ICD-10-CM | POA: Diagnosis not present

## 2019-10-23 LAB — CBC WITH DIFFERENTIAL/PLATELET
Abs Immature Granulocytes: 0.02 10*3/uL (ref 0.00–0.07)
Basophils Absolute: 0 10*3/uL (ref 0.0–0.1)
Basophils Relative: 0 %
Eosinophils Absolute: 0.1 10*3/uL (ref 0.0–0.5)
Eosinophils Relative: 1 %
HCT: 42.8 % (ref 36.0–46.0)
Hemoglobin: 14.5 g/dL (ref 12.0–15.0)
Immature Granulocytes: 0 %
Lymphocytes Relative: 28 %
Lymphs Abs: 2.4 10*3/uL (ref 0.7–4.0)
MCH: 33.3 pg (ref 26.0–34.0)
MCHC: 33.9 g/dL (ref 30.0–36.0)
MCV: 98.2 fL (ref 80.0–100.0)
Monocytes Absolute: 0.6 10*3/uL (ref 0.1–1.0)
Monocytes Relative: 7 %
Neutro Abs: 5.5 10*3/uL (ref 1.7–7.7)
Neutrophils Relative %: 64 %
Platelets: 212 10*3/uL (ref 150–400)
RBC: 4.36 MIL/uL (ref 3.87–5.11)
RDW: 12.9 % (ref 11.5–15.5)
WBC: 8.5 10*3/uL (ref 4.0–10.5)
nRBC: 0 % (ref 0.0–0.2)

## 2019-10-23 LAB — URINALYSIS, ROUTINE W REFLEX MICROSCOPIC
Bilirubin Urine: NEGATIVE
Glucose, UA: NEGATIVE mg/dL
Ketones, ur: NEGATIVE mg/dL
Nitrite: NEGATIVE
Protein, ur: NEGATIVE mg/dL
Specific Gravity, Urine: 1.015 (ref 1.005–1.030)
pH: 5 (ref 5.0–8.0)

## 2019-10-23 LAB — BASIC METABOLIC PANEL
Anion gap: 11 (ref 5–15)
BUN: 12 mg/dL (ref 6–20)
CO2: 27 mmol/L (ref 22–32)
Calcium: 9.5 mg/dL (ref 8.9–10.3)
Chloride: 106 mmol/L (ref 98–111)
Creatinine, Ser: 0.75 mg/dL (ref 0.44–1.00)
GFR calc Af Amer: >60 mL/min (ref >60)
GFR calc non Af Amer: >60 mL/min (ref >60)
Glucose, Bld: 131 mg/dL — ABNORMAL HIGH (ref 70–99)
Potassium: 3.8 mmol/L (ref 3.5–5.1)
Sodium: 144 mmol/L (ref 135–145)

## 2019-10-23 LAB — CBG MONITORING, ED: Glucose-Capillary: 128 mg/dL — ABNORMAL HIGH (ref 70–99)

## 2019-10-23 LAB — D-DIMER, QUANTITATIVE: D-Dimer, Quant: 0.28 ug/mL-FEU (ref 0.00–0.50)

## 2019-10-23 MED ORDER — SULFAMETHOXAZOLE-TRIMETHOPRIM 800-160 MG PO TABS
1.0000 | ORAL_TABLET | Freq: Once | ORAL | Status: AC
  Administered 2019-10-23: 07:00:00 1 via ORAL
  Filled 2019-10-23: qty 1

## 2019-10-23 MED ORDER — ONDANSETRON 4 MG PO TBDP
4.0000 mg | ORAL_TABLET | Freq: Once | ORAL | Status: AC
  Administered 2019-10-23: 4 mg via ORAL
  Filled 2019-10-23: qty 1

## 2019-10-23 MED ORDER — SULFAMETHOXAZOLE-TRIMETHOPRIM 800-160 MG PO TABS: 1.0000 | ORAL_TABLET | Freq: Two times a day (BID) | ORAL | 0 refills | Status: AC

## 2019-10-23 MED ORDER — HYDROCODONE-ACETAMINOPHEN 5-325 MG PO TABS
2.0000 | ORAL_TABLET | Freq: Once | ORAL | Status: AC
  Administered 2019-10-23: 04:00:00 2 via ORAL
  Filled 2019-10-23: qty 2

## 2019-10-23 NOTE — ED Provider Notes (Signed)
[facility] EMERGENCY DEPARTMENT Provider Note   CSN: [id number] Arrival date & time: 10/23/19  7681     History Chief Complaint  Patient presents with  . Back Pain  . Hypertension    Susan Hogan is a 59 y.o. female.  Patient presents to the emergency department with a chief complaint of high blood pressure and back pain.  She states that she has been having elevated blood pressures for quite some time now.  She states that they have been difficult to control.  She has been attempting to see her doctor for this, and keeps on having her medications changed.  She states that she has also been having back pain for the past 2 weeks.  She denies any cough or shortness of breath.  She states that she was told that her oxygen level was low today by EMS, but then was told that she needed to eat something.  Unclear if it was truly her oxygen or her blood sugar.  Patient states that she has been quite concerned about her blood pressure.  She also states that she has been having urinary frequency.  No successful treatments prior to arrival.  The history is provided by the patient. No language interpreter was used.       Past Medical History:  Diagnosis Date  . Anemia   . Arthritis    RA "states does not see Rheumatologist"  . Asthma   . Carpal tunnel syndrome    bilaterally  . Complication of anesthesia    difficulty breathing  . COPD (chronic obstructive pulmonary disease) ([place])   . COVID-19   . Degenerative disk disease    back  . Depression   . Diabetes mellitus   . Fibromyalgia   . GERD (gastroesophageal reflux disease)   . Headache(784.0)    migraines  . Heart murmur   . Hepatitis    In followup treatment for hepatitis C  . Hypertension    no medication for at this time, sees Dr. Dinah Beers Pcp  . Hypotensive episode    hx of  . Irritable bowel syndrome   . Kidney stone    with hematuria  . Memory loss   . Recurrent upper respiratory  infection (URI)   . Shortness of breath   . Syncope     Patient Active Problem List   Diagnosis Date Noted  . Chronic migraine without aura, with intractable migraine, so stated, with status migrainosus 03/01/2019  . Medication overuse headache 03/01/2019  . Seasonal allergic rhinitis due to pollen 03/25/2018  . Adnexal cyst 03/25/2018  . Morbid obesity ([place]) 03/25/2018  . Essential hypertension 03/25/2018  . Pure hypercholesterolemia 03/25/2018  . Sleep-disordered breathing 03/25/2018  . Vitamin D deficiency 03/25/2018  . Bilateral lower extremity edema 03/25/2018  . Acute meniscal tear of left knee 11/24/2016  . Chronic hepatitis C without hepatic coma ([place]) 11/24/2016  . Fatty liver 11/24/2016  . H/O degenerative disc disease 11/24/2016  . COPD (chronic obstructive pulmonary disease) ([place]) 10/22/2016  . Type 2 diabetes mellitus, cannot tolerate Metformin 07/25/2014  . Fibromyalgia 07/25/2014  . MDD (major depressive disorder), recurrent severe, without psychosis ([place]) 03/17/2013  . Hepatitis C 08/02/2007  . IBS (irritable bowel syndrome) 08/02/2007  . Mild persistent asthma with acute exacerbation 09/21/1962    Past Surgical History:  Procedure Laterality Date  . CHOLECYSTECTOMY  2019  . DILATION AND CURETTAGE OF UTERUS    . Left knee surgery    . LIVER  BIOPSY    . TONSILLECTOMY    . TUBAL LIGATION       OB History   No obstetric history on file.     Family History  Problem Relation Age of Onset  . Heart disease Mother   . Diabetes Mother   . Asthma Mother   . Anemia Mother   . Cerebral aneurysm Mother        ruptured  . Kidney failure Mother   . Migraines Mother   . Parkinson's disease Mother   . Colon cancer Maternal Uncle   . Heart disease Maternal Uncle   . Heart disease Maternal Grandmother   . Diabetes Maternal Grandmother   . Asthma Maternal Grandmother   . Heart Problems Maternal Grandmother   . Heart Problems Other        through family  .  Diabetes Granddaughter   . Diabetes Daughter     Social History   Tobacco Use  . Smoking status: Never Smoker  . Smokeless tobacco: Never Used  Substance Use Topics  . Alcohol use: Never    Alcohol/week: 0.0 standard drinks  . Drug use: Never    Home Medications Prior to Admission medications   Medication Sig Start Date End Date Taking? Authorizing Provider  albuterol (PROVENTIL HFA;VENTOLIN HFA) 108 (90 Base) MCG/ACT inhaler Inhale 1-2 puffs into the lungs every 6 (six) hours as needed for wheezing or shortness of breath. 03/03/18   Couture, Cortni S, PA-C  amLODipine (NORVASC) 10 MG tablet Take 1 tablet (10 mg total) by mouth daily. 10/10/19 01/08/20  O'Neal, Cassie Freer, MD  blood glucose meter kit and supplies KIT Dispense based on patient and insurance preference. Use up to four times daily as directed. (FOR ICD-9 250.00, 250.01). 03/25/18   Briscoe Deutscher, DO  glucose blood (ACCU-CHEK ACTIVE STRIPS) test strip Use as instructed 04/15/16   Nona Dell, PA-C  losartan (COZAAR) 50 MG tablet Take 50 mg by mouth daily. 10/11/19   [provider]  methocarbamol (ROBAXIN) 500 MG tablet Take 1 tablet (500 mg total) by mouth every 8 (eight) hours as needed for muscle spasms (back pain). 10/14/19   Mesner, Corene Cornea, MD  potassium chloride SA (KLOR-CON) 20 MEQ tablet Take 20 mEq by mouth daily. 10/11/19   [provider]  cetirizine (ZYRTEC ALLERGY) 10 MG tablet Take 1 tablet (10 mg total) by mouth daily. 09/24/19 10/10/19  Petrucelli, Glynda Jaeger, PA-C  dicyclomine (BENTYL) 20 MG tablet Take 1 tablet (20 mg total) by mouth 2 (two) times daily. Patient not taking: Reported on 10/01/2019 08/19/19 10/01/19  Lorayne Bender, PA-C  diphenhydrAMINE (BENADRYL) 25 MG tablet Take 1 tablet (25 mg total) by mouth every 6 (six) hours as needed. Patient not taking: Reported on 10/01/2019 09/24/19 10/01/19  Petrucelli, Aldona Bar R, PA-C  escitalopram (LEXAPRO) 10 MG tablet TAKE 1 TABLET(10 MG) BY  MOUTH DAILY Patient not taking: Reported on 07/05/2018 06/01/18 09/24/19  Briscoe Deutscher, DO  labetalol (NORMODYNE) 100 MG tablet Take 1 tablet (100 mg total) by mouth 2 (two) times daily. Patient not taking: Reported on 10/01/2019 08/30/19 10/01/19  Milton Ferguson, MD  prochlorperazine (COMPAZINE) 10 MG tablet Take 1 tablet (10 mg total) by mouth every 6 (six) hours as needed. May take with '25mg'$  of benadryl for migraines.. Patient not taking: Reported on 10/01/2019 03/09/19 10/01/19  Melvenia Beam, MD  rizatriptan (MAXALT-MLT) 10 MG disintegrating tablet Take 1 tablet (10 mg total) by mouth as needed for migraine. May repeat in  2 hours if needed. Maximum 2x in one day. Patient not taking: Reported on 10/14/2019 03/16/19 10/14/19  Melvenia Beam, MD  topiramate (TOPAMAX) 100 MG tablet Take 1 tablet (100 mg total) by mouth at bedtime. Patient not taking: Reported on 10/01/2019 03/09/19 10/01/19  Melvenia Beam, MD    Allergies    Anesthetic ether [ether], Nubain [nalbuphine hcl], Penicillins, Lisinopril, Advair diskus [fluticasone-salmeterol], and Lactose intolerance (gi)  Review of Systems   Review of Systems  All other systems reviewed and are negative.   Physical Exam Updated Vital Signs BP (!) 160/90 (BP Location: Right Arm)   Pulse 81   Temp 98.1 F (36.7 C) (Oral)   Resp 18   LMP  (LMP Unknown)   SpO2 100%   Physical Exam Vitals and nursing note reviewed.  Constitutional:      General: She is not in acute distress.    Appearance: She is well-developed.  HENT:     Head: Normocephalic and atraumatic.  Eyes:     Conjunctiva/sclera: Conjunctivae normal.  Cardiovascular:     Rate and Rhythm: Normal rate and regular rhythm.     Heart sounds: No murmur.     Comments: Normal distal pulses Pulmonary:     Effort: Pulmonary effort is normal. No respiratory distress.     Breath sounds: Normal breath sounds.     Comments: Clear to auscultation bilaterally Abdominal:     Palpations:  Abdomen is soft.     Tenderness: There is no abdominal tenderness.  Musculoskeletal:        General: Normal range of motion.     Cervical back: Neck supple.  Skin:    General: Skin is warm and dry.  Neurological:     Mental Status: She is alert and oriented to person, place, and time.  Psychiatric:        Mood and Affect: Mood normal.        Behavior: Behavior normal.     Comments: Somewhat anxious     ED Results / Procedures / Treatments   Labs (all labs ordered are listed, but only abnormal results are displayed) Labs Reviewed  BASIC METABOLIC PANEL - Abnormal; Notable for the following components:      Result Value   Glucose, Bld 131 (*)    All other components within normal limits  URINALYSIS, ROUTINE W REFLEX MICROSCOPIC - Abnormal; Notable for the following components:   Hgb urine dipstick MODERATE (*)    Leukocytes,Ua SMALL (*)    Bacteria, UA RARE (*)    All other components within normal limits  CBG MONITORING, ED - Abnormal; Notable for the following components:   Glucose-Capillary 128 (*)    All other components within normal limits  URINE CULTURE  CBC WITH DIFFERENTIAL/PLATELET  D-DIMER, QUANTITATIVE (NOT AT [facility])    EKG None  Radiology No results found.  Procedures Procedures (including critical care time)  Medications Ordered in ED Medications  HYDROcodone-acetaminophen (NORCO/VICODIN) 5-325 MG per tablet 2 tablet (has no administration in time range)    ED Course  I have reviewed the triage vital signs and the nursing notes.  Pertinent labs & imaging results that were available during my care of the patient were reviewed by me and considered in my medical decision making (see chart for details).    MDM Rules/Calculators/A&P                      Patient with elevated blood pressure and back pain.  She states that she has been having elevated blood pressures for a while.  She has been seen multiple times in the emergency department for the  same.  She states that she follows up with her regular doctor, keeps on being prescribed new medicine, but nothing is working.  She also states that she has having back pain now.  She has had back pain for the past 2 weeks.  She does have some mild muscular tenderness in her mid back.  Her lungs are clear to auscultation.  I think that her symptoms are likely muscular.  She has had recent CT and chest x-ray without any bony abnormality.  She has also had Covid back at the end of last year.  Unclear if her symptoms are from Covid and are becoming more chronic in nature.  She may benefit from follow-up with pulmonology.  Has been seen by cardiology and has had reassuring stress test and echocardiogram.  Seems fairly anxious, and this could be driving some of her symptoms.  She is noted to have normal vitals with exception of blood pressure of 160/90 here.  She is in no acute distress.  Will check d-dimer for back pain and possible low O2 sat, although normal here.  I think she is low risk for PE.  Will check UA for urinary frequency.  D-dimer is 0.28.  Doubt PE.  UA is abnormal, with urinary frequency and back pain, will treat for UTI and send urine for culture.  PCP follow-up for BP.   Final Clinical Impression(s) / ED Diagnoses Final diagnoses:  Abnormal urinalysis  Back pain, unspecified back location, unspecified back pain laterality, unspecified chronicity  Elevated blood pressure reading    Rx / DC Orders ED Discharge Orders    None       Montine Circle, PA-C 58/09/98 [id number]    Delora Fuel, MD 50/53/97 [phone]

## 2019-10-23 NOTE — ED Triage Notes (Signed)
Patient reports left back pain onset this evening , denies injury , respirations unlabored , patient added elevated blood pressure today 160/100 .

## 2019-10-23 NOTE — ED Notes (Signed)
Discharge instructions reviewed with pt. Pt verbalized understanding.   

## 2019-10-26 ENCOUNTER — Encounter: Payer: Self-pay | Admitting: Pulmonary Disease

## 2019-10-30 ENCOUNTER — Other Ambulatory Visit: Payer: Self-pay

## 2019-10-30 ENCOUNTER — Emergency Department (HOSPITAL_COMMUNITY): Payer: Medicaid Other

## 2019-10-30 ENCOUNTER — Emergency Department (HOSPITAL_COMMUNITY)
Admission: EM | Admit: 2019-10-30 | Discharge: 2019-10-30 | Disposition: A | Discharge status: Home / Self Care | Payer: Medicaid Other | Attending: Emergency Medicine | Admitting: Emergency Medicine

## 2019-10-30 DIAGNOSIS — T148XXA Other injury of unspecified body region, initial encounter: Secondary | ICD-10-CM | POA: Diagnosis not present

## 2019-10-30 DIAGNOSIS — M069 Rheumatoid arthritis, unspecified: Secondary | ICD-10-CM | POA: Insufficient documentation

## 2019-10-30 DIAGNOSIS — X58XXXA Exposure to other specified factors, initial encounter: Secondary | ICD-10-CM | POA: Diagnosis not present

## 2019-10-30 DIAGNOSIS — Y999 Unspecified external cause status: Secondary | ICD-10-CM | POA: Diagnosis not present

## 2019-10-30 DIAGNOSIS — H9202 Otalgia, left ear: Secondary | ICD-10-CM | POA: Insufficient documentation

## 2019-10-30 DIAGNOSIS — J449 Chronic obstructive pulmonary disease, unspecified: Secondary | ICD-10-CM | POA: Insufficient documentation

## 2019-10-30 DIAGNOSIS — J45909 Unspecified asthma, uncomplicated: Secondary | ICD-10-CM | POA: Diagnosis not present

## 2019-10-30 DIAGNOSIS — Y929 Unspecified place or not applicable: Secondary | ICD-10-CM | POA: Diagnosis not present

## 2019-10-30 DIAGNOSIS — M549 Dorsalgia, unspecified: Secondary | ICD-10-CM | POA: Diagnosis present

## 2019-10-30 DIAGNOSIS — I10 Essential (primary) hypertension: Secondary | ICD-10-CM | POA: Diagnosis not present

## 2019-10-30 DIAGNOSIS — Z79899 Other long term (current) drug therapy: Secondary | ICD-10-CM | POA: Insufficient documentation

## 2019-10-30 DIAGNOSIS — E119 Type 2 diabetes mellitus without complications: Secondary | ICD-10-CM | POA: Diagnosis not present

## 2019-10-30 DIAGNOSIS — Y939 Activity, unspecified: Secondary | ICD-10-CM | POA: Insufficient documentation

## 2019-10-30 LAB — URINALYSIS, ROUTINE W REFLEX MICROSCOPIC
Bacteria, UA: NONE SEEN
Bilirubin Urine: NEGATIVE
Glucose, UA: NEGATIVE mg/dL
Ketones, ur: NEGATIVE mg/dL
Leukocytes,Ua: NEGATIVE
Nitrite: NEGATIVE
Protein, ur: NEGATIVE mg/dL
Specific Gravity, Urine: 1.01 (ref 1.005–1.030)
pH: 5 (ref 5.0–8.0)

## 2019-10-30 LAB — CBG MONITORING, ED: Glucose-Capillary: 88 mg/dL (ref 70–99)

## 2019-10-30 MED ORDER — ACETIC ACID 2 % OT SOLN: 4.0000 [drp] | Freq: Four times a day (QID) | OTIC | 0 refills | Status: DC

## 2019-10-30 MED ORDER — KETOROLAC TROMETHAMINE 60 MG/2ML IM SOLN
60.0000 mg | Freq: Once | INTRAMUSCULAR | Status: AC
  Administered 2019-10-30: 04:00:00 60 mg via INTRAMUSCULAR
  Filled 2019-10-30: qty 2

## 2019-10-30 MED ORDER — CYCLOBENZAPRINE HCL 10 MG PO TABS
5.0000 mg | ORAL_TABLET | Freq: Once | ORAL | Status: AC
  Administered 2019-10-30: 5 mg via ORAL
  Filled 2019-10-30: qty 1

## 2019-10-30 NOTE — ED Triage Notes (Signed)
Pt complaining of a headache started at 9 PM, left ear pain for 2 months, and left back pain that started yesterday.

## 2019-10-30 NOTE — ED Provider Notes (Signed)
[facility] EMERGENCY DEPARTMENT Provider Note   CSN: [id number] Arrival date & time: 10/30/19  0200     History Chief Complaint  Patient presents with  . Multiple Complaints    Susan Hogan is a 59 y.o. female with a history of COPD, diabetes mellitus type 2, hypertension, asthma, migraines who presents to the emergency department by EMS with multiple complaints.  1.  She reports that yesterday that she got into an argument with her son and "got very worked up".  She reports that she began having some pain in her ribs after the verbal argument.  Later in the day, she went to stand after sitting on her couch and was unable to fully straighten her back for an unknown amount of time.  After she was fully able to stand, she felt a twinge in her left mid back.  She is concerned that it is her kidney and that she has a kidney stone because she had one kidney stone several years ago.  She reports that she has been able to bend and stand without any other locking up of her back since the incident yesterday.  She continues to endorse localized pain in her left mid back.  She took oxycodone yesterday that was recently prescribed by her primary care provider, but reports that it did not improve her symptoms so she did not take anymore.  She took Advil earlier today with no improvement in her symptoms.  2.  She reports that she has not slept for the last 2 nights due to the pain in her left back.  She reports that she is feeling very tired and she is worried about her blood sugars since she is a diabetic and has not slept.    3.  She reports that she checked her blood pressure earlier tonight and it was 171/106.  She is unsure why her pressure is elevated.  She suspects that it is due to the pain in her back and not sleeping for the last 2 days.  4.  She reports that she has been having pain in her left ear for the last 2 days.  She was seen by ENT earlier this month and was told that  the pain was referred and coming from TMJ.  5.  She also has not checked her blood sugar at home today.  She is also concerned because she has been urinating more frequently over the last few days.  She also reports increased thirst.  She also reports that she developed loose stools yesterday, but no diarrhea or hematochezia.  Reports that she is currently feeling nauseated, but has had no vomiting.  She is concerned that her blood sugar may be higher that she may have a UTI.  She has not checked her blood sugar at home.  She denies fevers, chills, numbness, weakness, dizziness, lightheadedness, neck pain or stiffness, abdominal pain, chest pain, shortness of breath, hearing loss, visual changes, slurred speech, facial droop.  Of note, the patient had 8 ER visits last month this is the patient's second ER visit in February.  The history is provided by the patient. No language interpreter was used.       Past Medical History:  Diagnosis Date  . Anemia   . Arthritis    RA "states does not see Rheumatologist"  . Asthma   . Carpal tunnel syndrome    bilaterally  . Complication of anesthesia    difficulty breathing  . COPD (chronic obstructive  pulmonary disease) ([place])   . COVID-19   . Degenerative disk disease    back  . Depression   . Diabetes mellitus   . Fibromyalgia   . GERD (gastroesophageal reflux disease)   . Headache(784.0)    migraines  . Heart murmur   . Hepatitis    In followup treatment for hepatitis C  . Hypertension    no medication for at this time, sees Dr. Dinah Beers Pcp  . Hypotensive episode    hx of  . Irritable bowel syndrome   . Kidney stone    with hematuria  . Memory loss   . Recurrent upper respiratory infection (URI)   . Shortness of breath   . Syncope     Patient Active Problem List   Diagnosis Date Noted  . Chronic migraine without aura, with intractable migraine, so stated, with status migrainosus 03/01/2019  . Medication overuse  headache 03/01/2019  . Seasonal allergic rhinitis due to pollen 03/25/2018  . Adnexal cyst 03/25/2018  . Morbid obesity ([place]) 03/25/2018  . Essential hypertension 03/25/2018  . Pure hypercholesterolemia 03/25/2018  . Sleep-disordered breathing 03/25/2018  . Vitamin D deficiency 03/25/2018  . Bilateral lower extremity edema 03/25/2018  . Acute meniscal tear of left knee 11/24/2016  . Chronic hepatitis C without hepatic coma ([place]) 11/24/2016  . Fatty liver 11/24/2016  . H/O degenerative disc disease 11/24/2016  . COPD (chronic obstructive pulmonary disease) ([place]) 10/22/2016  . Type 2 diabetes mellitus, cannot tolerate Metformin 07/25/2014  . Fibromyalgia 07/25/2014  . MDD (major depressive disorder), recurrent severe, without psychosis ([place]) 03/17/2013  . Hepatitis C 08/02/2007  . IBS (irritable bowel syndrome) 08/02/2007  . Mild persistent asthma with acute exacerbation 09/21/1962    Past Surgical History:  Procedure Laterality Date  . CHOLECYSTECTOMY  2019  . DILATION AND CURETTAGE OF UTERUS    . Left knee surgery    . LIVER BIOPSY    . TONSILLECTOMY    . TUBAL LIGATION       OB History   No obstetric history on file.     Family History  Problem Relation Age of Onset  . Heart disease Mother   . Diabetes Mother   . Asthma Mother   . Anemia Mother   . Cerebral aneurysm Mother        ruptured  . Kidney failure Mother   . Migraines Mother   . Parkinson's disease Mother   . Colon cancer Maternal Uncle   . Heart disease Maternal Uncle   . Heart disease Maternal Grandmother   . Diabetes Maternal Grandmother   . Asthma Maternal Grandmother   . Heart Problems Maternal Grandmother   . Heart Problems Other        through family  . Diabetes Granddaughter   . Diabetes Daughter     Social History   Tobacco Use  . Smoking status: Never Smoker  . Smokeless tobacco: Never Used  Substance Use Topics  . Alcohol use: Never    Alcohol/week: 0.0 standard drinks  . Drug  use: Never    Home Medications Prior to Admission medications   Medication Sig Start Date End Date Taking? Authorizing Provider  albuterol (PROVENTIL HFA;VENTOLIN HFA) 108 (90 Base) MCG/ACT inhaler Inhale 1-2 puffs into the lungs every 6 (six) hours as needed for wheezing or shortness of breath. 03/03/18  Yes Couture, Cortni S, PA-C  amLODipine (NORVASC) 10 MG tablet Take 1 tablet (10 mg total) by mouth daily. 10/10/19 01/08/20 Yes O'Neal, [place]  Marcello Moores, MD  losartan (COZAAR) 50 MG tablet Take 50 mg by mouth daily. 10/11/19  Yes [provider]  methocarbamol (ROBAXIN) 500 MG tablet Take 1 tablet (500 mg total) by mouth every 8 (eight) hours as needed for muscle spasms (back pain). 10/14/19  Yes Mesner, Corene Cornea, MD  potassium chloride SA (KLOR-CON) 20 MEQ tablet Take 20 mEq by mouth daily. 10/11/19  Yes [provider]  sulfamethoxazole-trimethoprim (BACTRIM DS) 800-160 MG tablet Take 1 tablet by mouth 2 (two) times daily for 7 days. 10/23/19 10/30/19 Yes Montine Circle, PA-C  acetic acid 2 % otic solution Place 4 drops into the left ear 4 (four) times daily. 10/30/19   Desmin Daleo A, PA-C  blood glucose meter kit and supplies KIT Dispense based on patient and insurance preference. Use up to four times daily as directed. (FOR ICD-9 250.00, 250.01). 03/25/18   Briscoe Deutscher, DO  glucose blood (ACCU-CHEK ACTIVE STRIPS) test strip Use as instructed 04/15/16   Nona Dell, PA-C  cetirizine (ZYRTEC ALLERGY) 10 MG tablet Take 1 tablet (10 mg total) by mouth daily. 09/24/19 10/10/19  Petrucelli, Glynda Jaeger, PA-C  dicyclomine (BENTYL) 20 MG tablet Take 1 tablet (20 mg total) by mouth 2 (two) times daily. Patient not taking: Reported on 10/01/2019 08/19/19 10/01/19  Lorayne Bender, PA-C  diphenhydrAMINE (BENADRYL) 25 MG tablet Take 1 tablet (25 mg total) by mouth every 6 (six) hours as needed. Patient not taking: Reported on 10/01/2019 09/24/19 10/01/19  Petrucelli, Aldona Bar R, PA-C  escitalopram  (LEXAPRO) 10 MG tablet TAKE 1 TABLET(10 MG) BY MOUTH DAILY Patient not taking: Reported on 07/05/2018 06/01/18 09/24/19  Briscoe Deutscher, DO  labetalol (NORMODYNE) 100 MG tablet Take 1 tablet (100 mg total) by mouth 2 (two) times daily. Patient not taking: Reported on 10/01/2019 08/30/19 10/01/19  Milton Ferguson, MD  prochlorperazine (COMPAZINE) 10 MG tablet Take 1 tablet (10 mg total) by mouth every 6 (six) hours as needed. May take with '25mg'$  of benadryl for migraines.. Patient not taking: Reported on 10/01/2019 03/09/19 10/01/19  Melvenia Beam, MD  rizatriptan (MAXALT-MLT) 10 MG disintegrating tablet Take 1 tablet (10 mg total) by mouth as needed for migraine. May repeat in 2 hours if needed. Maximum 2x in one day. Patient not taking: Reported on 10/14/2019 03/16/19 10/14/19  Melvenia Beam, MD  topiramate (TOPAMAX) 100 MG tablet Take 1 tablet (100 mg total) by mouth at bedtime. Patient not taking: Reported on 10/01/2019 03/09/19 10/01/19  Melvenia Beam, MD    Allergies    Anesthetic ether [ether], Nubain [nalbuphine hcl], Penicillins, Lisinopril, Advair diskus [fluticasone-salmeterol], and Lactose intolerance (gi)  Review of Systems   Review of Systems  Constitutional: Negative for activity change, chills and fever.  HENT: Positive for ear pain. Negative for congestion, ear discharge, facial swelling, sinus pressure, sinus pain, sore throat, tinnitus and voice change.   Respiratory: Negative for shortness of breath.   Cardiovascular: Negative for chest pain.  Gastrointestinal: Positive for nausea. Negative for abdominal pain, constipation, diarrhea and vomiting.  Endocrine: Positive for polydipsia. Negative for polyphagia and polyuria.  Genitourinary: Positive for dysuria. Negative for vaginal bleeding, vaginal discharge and vaginal pain.  Musculoskeletal: Positive for back pain and myalgias.  Skin: Negative for rash.  Allergic/Immunologic: Negative for immunocompromised state.  Neurological:  Positive for headaches. Negative for dizziness, seizures, syncope, weakness and numbness.  Psychiatric/Behavioral: Negative for confusion.    Physical Exam Updated Vital Signs BP 127/68   Pulse 79   Temp 98.2  F (36.8 C) (Oral)   Resp 16   LMP  (LMP Unknown)   SpO2 99%   Physical Exam Vitals and nursing note reviewed.  Constitutional:      General: She is not in acute distress.    Appearance: She is not ill-appearing.     Comments: Anxious appearing.  Nontoxic-appearing.  No acute distress.  HENT:     Head: Normocephalic.     Comments: Erythema noted in the left canal.  No mastoiditis.  TM is unremarkable.  Normal exam of the right ear.    Mouth/Throat:     Mouth: Mucous membranes are moist.     Comments: Mucous membranes are moist Eyes:     Extraocular Movements: Extraocular movements intact.     Conjunctiva/sclera: Conjunctivae normal.     Pupils: Pupils are equal, round, and reactive to light.  Cardiovascular:     Rate and Rhythm: Normal rate and regular rhythm.     Heart sounds: No murmur. No friction rub. No gallop.   Pulmonary:     Effort: Pulmonary effort is normal. No respiratory distress.     Breath sounds: No stridor. No wheezing, rhonchi or rales.  Chest:     Chest wall: No tenderness.  Abdominal:     General: There is no distension.     Palpations: Abdomen is soft. There is no mass.     Tenderness: There is no abdominal tenderness. There is no right CVA tenderness, left CVA tenderness, guarding or rebound.     Hernia: No hernia is present.     Comments: No CVA tenderness bilaterally.  Musculoskeletal:     Cervical back: Normal range of motion and neck supple.     Comments: Point tenderness to palpation to the left lumbar musculature, inferior to the CVA region.  No tenderness to the cervical, thoracic, or lumbar spinous processes or bilateral paraspinal muscles.  No right-sided tenderness.  No overlying rashes, erythema, edema, or warmth.  Skin:    General:  Skin is warm.     Findings: No rash.     Comments: Skin turgor.  Capillary refill is less than 2.  Neurological:     Mental Status: She is alert.     Comments: Mental Status: Patient is awake, alert, oriented to person, place, month, year, and situation. Patient is able to give a clear and coherent history. Cranial Nerves: II: Visual Fields are full. Pupils are equal, round, and reactive to light. III,IV, VI: EOMI without ptosis or diploplia.  V: Facial sensation is symmetric to temperature VII: Facial movement is symmetric.  VIII: hearing is intact to voice X: Uvula elevates symmetrically XI: Shoulder shrug is symmetric. XII: tongue is midline without atrophy or fasciculations.  Motor: Tone is normal. Bulk is normal. 5/5 strength was present in all four extremities.  Sensory: Sensation is symmetric to light touch and temperature in the arms and legs. Cerebellar: FNF intact bilaterally   Psychiatric:        Behavior: Behavior normal.     ED Results / Procedures / Treatments   Labs (all labs ordered are listed, but only abnormal results are displayed) Labs Reviewed  URINALYSIS, ROUTINE W REFLEX MICROSCOPIC - Abnormal; Notable for the following components:      Result Value   Color, Urine STRAW (*)    Hgb urine dipstick SMALL (*)    All other components within normal limits  CBG MONITORING, ED    EKG None  Radiology DG Thoracic Spine 2 View  Result Date: 10/30/2019 CLINICAL DATA:  59 year old female with midline upper back pain since yesterday. No recent injury. EXAM: THORACIC SPINE 2 VIEWS COMPARISON:  Thoracic radiographs 04/15/2016. FINDINGS: Hypoplastic or absent ribs at T12. Otherwise normal thoracic segmentation. Bone mineralization is within normal limits for age. Thoracic kyphosis appears stable since 2017. Cervicothoracic junction alignment is within normal limits. Thoracic disc spaces are relatively preserved. Mild midthoracic degenerative endplate spurring has  mildly increased. Posterior ribs appear intact. No acute osseous abnormality identified. Right upper quadrant cholecystectomy clips. Negative visible bowel gas pattern. Visible lungs are clear. Normal cardiac size and mediastinal contours. Visualized tracheal air column is within normal limits. IMPRESSION: No acute osseous abnormality identified in the thoracic spine. Hypoplastic or absent ribs at T12. Electronically Signed   By: Genevie Ann M.D.   On: 10/30/2019 04:32    Procedures Procedures (including critical care time)  Medications Ordered in ED Medications  cyclobenzaprine (FLEXERIL) tablet 5 mg (5 mg Oral Given 10/30/19 0420)  ketorolac (TORADOL) injection 60 mg (60 mg Intramuscular Given 10/30/19 0419)    ED Course  I have reviewed the triage vital signs and the nursing notes.  Pertinent labs & imaging results that were available during my care of the patient were reviewed by me and considered in my medical decision making (see chart for details).    MDM Rules/Calculators/A&P                      59 year old female with a history of COPD, diabetes mellitus type 2, hypertension, asthma, migraines who presents to the emergency department with multiple complaints.  Of note, the patient was seen 8 times in the month of January.  This is her second visit in the month of February.  She typically presents with similar complaints, including high blood pressure at home that seems to improve in the ER.  She has been having back pain.  She is always concerned that she is dehydrated and has urinary complaints.   Her blood pressures have been consistently normal to mildly hypertensive when trended out in my chart.  Today, her blood pressure is 120/60 during my evaluation.  She has no tachycardia.  No tachypnea.  She is afebrile.  She is not hypoxic.   Regarding her ear pain, she does have some redness noted in the left ear canal.  Will discharge with acetic acid drops.  She can follow-up with ENT.  Do  not see any evidence of AOM.  She does not appear to have infectious external otitis media.  Doubt malignant otitis externa.   She is also endorsing a headache at the top of her head.  Her neurologic exam is unremarkable.  I have reviewed her medical record and she has been seen for headaches a number of time.  Will give Toradol.  Doubt CVA or central vertigo.  She had a urinalysis during her last ER visit that grew less than 100,000 colonies of strep viridans.  During today with mild hemoglobinuria, which appears chronic.  Urine is otherwise unremarkable.  In the last month, she has been seen multiple times by family medicine, ENT, cardiology.  She is pending a referral to a spine specialist.  She had an LP for headaches on December 10 that was unremarkable.  She underwent a PE study in November.  She recently had a CT of her abdomen and pelvis also in November.  She is also concerned that she is dehydrated and may need IV fluids and is  concerned about her blood sugar she has not been checking it at home.  I have reviewed her labs from her last ER visit on February 1.  They were unremarkable.  She has normal vital signs today and has good skin turgor with moist mucous membranes and good capillary refill.  Her CBG was 88.  Her urinalysis does not contain ketones.  I discussed with the patient at length that she does not clinically appear dehydrated.  She is tolerating fluids by mouth.  IV fluids are not warranted at this time.  Overall the patient is very anxious appearing.  She may benefit from outpatient psychiatry referral.  At this time, I feel that no further urgent or emergent work-up is indicated.  I have encouraged her to take her blood pressure cuff to her next appointment to verify the accuracy.  I recommended lidocaine patches for her back pain and to follow-up with her spine specialist.  She will be given acetic acid drops for her left otitis externa.  She is hemodynamically stable and in no  acute distress.  Safe for discharge home with outpatient follow-up as indicated.  Final Clinical Impression(s) / ED Diagnoses Final diagnoses:  Left ear pain  Musculoskeletal strain    Rx / DC Orders ED Discharge Orders         Ordered    acetic acid 2 % otic solution  4 times daily     10/30/19 0502           Roald Lukacs A, PA-C 10/30/19 0815    Mesner, Corene Cornea, MD 10/31/19 [id number]

## 2019-10-30 NOTE — Discharge Instructions (Addendum)
Thank you for allowing me to care for you today in the Emergency Department.   Keep your follow-up appointment with the spine specialist later this month.  For pain, you can take 600 mg of ibuprofen with food or 650 mg of Tylenol once every 6 hours.  Lidocaine patches are also available over-the-counter.  If you have an area on your back that is hurting.  Do not apply more than 1 patch at a time.  Use as directed on the label.  Do not apply a heating pad to the area while a patch is in place as this may cause burns.  If your pain continues, please mention it to your spine specialist when you have your appointment later this month.  There was some redness in your left ear canal.  Place 4 drops every 6 hours into the left ear for the next 3 to 5 days.  Follow-up with ear nose and throat if your symptoms do not seem to significantly improve in the next week.  Please take your blood pressure cuff to your next appointment with primary care.  When your blood pressure is checked in the office, you can also then check it on your blood pressure cuff to see if there is any significant difference in your blood pressure readings.  Return to the emergency department if you develop significantly elevated blood pressure with a severe headache with numbness, weakness, visual changes, severe chest pain and respiratory distress, if you become unable to pee, or if you develop other new, concerning symptoms

## 2019-10-30 NOTE — ED Notes (Signed)
Discharge instructions reviewed. Discussed prescription, over-the-counter medications, and follow up care with the pt. Allowed the pt time for questions in regards to discharge instructions. All questions answered at this time. Pt verbalized understanding. Marland Kitchen

## 2019-10-31 ENCOUNTER — Emergency Department (HOSPITAL_COMMUNITY): Payer: No Typology Code available for payment source

## 2019-10-31 ENCOUNTER — Encounter (HOSPITAL_COMMUNITY): Payer: Self-pay | Admitting: Emergency Medicine

## 2019-10-31 ENCOUNTER — Emergency Department (HOSPITAL_COMMUNITY)
Admission: EM | Admit: 2019-10-31 | Discharge: 2019-10-31 | Disposition: A | Discharge status: Home / Self Care | Payer: No Typology Code available for payment source | Attending: Emergency Medicine | Admitting: Emergency Medicine

## 2019-10-31 ENCOUNTER — Other Ambulatory Visit: Payer: Self-pay

## 2019-10-31 DIAGNOSIS — R519 Headache, unspecified: Secondary | ICD-10-CM | POA: Diagnosis not present

## 2019-10-31 DIAGNOSIS — E119 Type 2 diabetes mellitus without complications: Secondary | ICD-10-CM | POA: Diagnosis not present

## 2019-10-31 DIAGNOSIS — Y999 Unspecified external cause status: Secondary | ICD-10-CM | POA: Diagnosis not present

## 2019-10-31 DIAGNOSIS — Z8616 Personal history of COVID-19: Secondary | ICD-10-CM | POA: Insufficient documentation

## 2019-10-31 DIAGNOSIS — Y939 Activity, unspecified: Secondary | ICD-10-CM | POA: Insufficient documentation

## 2019-10-31 DIAGNOSIS — M79632 Pain in left forearm: Secondary | ICD-10-CM | POA: Diagnosis not present

## 2019-10-31 DIAGNOSIS — J449 Chronic obstructive pulmonary disease, unspecified: Secondary | ICD-10-CM | POA: Insufficient documentation

## 2019-10-31 DIAGNOSIS — Y929 Unspecified place or not applicable: Secondary | ICD-10-CM | POA: Diagnosis not present

## 2019-10-31 DIAGNOSIS — I10 Essential (primary) hypertension: Secondary | ICD-10-CM | POA: Insufficient documentation

## 2019-10-31 DIAGNOSIS — M542 Cervicalgia: Secondary | ICD-10-CM | POA: Diagnosis present

## 2019-10-31 DIAGNOSIS — S161XXA Strain of muscle, fascia and tendon at neck level, initial encounter: Secondary | ICD-10-CM | POA: Diagnosis not present

## 2019-10-31 DIAGNOSIS — Z79899 Other long term (current) drug therapy: Secondary | ICD-10-CM | POA: Diagnosis not present

## 2019-10-31 DIAGNOSIS — B182 Chronic viral hepatitis C: Secondary | ICD-10-CM | POA: Diagnosis not present

## 2019-10-31 LAB — URINE CULTURE: Culture: 70000 — AB

## 2019-10-31 MED ORDER — IBUPROFEN 400 MG PO TABS
600.0000 mg | ORAL_TABLET | Freq: Once | ORAL | Status: AC
  Administered 2019-10-31: 600 mg via ORAL
  Filled 2019-10-31: qty 1

## 2019-10-31 NOTE — Discharge Instructions (Addendum)
Return here as needed.  Ice and heat on your neck and areas that are sore.  You can take over-the-counter medications.
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
## 2019-11-01 ENCOUNTER — Telehealth: Payer: Self-pay | Admitting: *Deleted

## 2019-11-01 NOTE — Telephone Encounter (Signed)
Post ED Visit - Positive Culture Follow-up  Culture report reviewed by antimicrobial stewardship pharmacist: [facility] []  , Pharm.D. []  Enzo Bi, Pharm.D., BCPS AQ-ID []  , Pharm.D., BCPS []  Celedonio Miyamoto, Pharm.D., BCPS []  [place], Garvin Fila.D., BCPS, AAHIVP []  , Pharm.D., BCPS, AAHIVP [x]  Georgina Pillion, PharmD, BCPS []  , PharmD, BCPS []  [place], PharmD, BCPS []  [address], PharmD []  , PharmD, BCPS []  Estella Husk, PharmD  Pharmacy Team []  Lysle Pearl, PharmD []  , PharmD []  Phillips Climes, PharmD []  , Rph []  Agapito Games) , PharmD []  Verlan Friends, PharmD []  , PharmD []  Mervyn Gay, PharmD []  , PharmD []  Vinnie Level, PharmD []  [facility], PharmD []  , PharmD []  Len Childs, PharmD   Positive urine culture Treated with Sulfamethoxazole-Trimethoprim, organism sensitive to the same and no further patient follow-up is required at this time.  [facility] 11/01/2019, 9:34 AM

## 2019-11-08 ENCOUNTER — Other Ambulatory Visit: Payer: Self-pay

## 2019-11-08 ENCOUNTER — Emergency Department (HOSPITAL_BASED_OUTPATIENT_CLINIC_OR_DEPARTMENT_OTHER)
Admission: EM | Admit: 2019-11-08 | Discharge: 2019-11-08 | Disposition: A | Discharge status: Home / Self Care | Payer: Medicaid Other | Attending: Emergency Medicine | Admitting: Emergency Medicine

## 2019-11-08 ENCOUNTER — Encounter (HOSPITAL_BASED_OUTPATIENT_CLINIC_OR_DEPARTMENT_OTHER): Payer: Self-pay | Admitting: Emergency Medicine

## 2019-11-08 ENCOUNTER — Emergency Department (HOSPITAL_BASED_OUTPATIENT_CLINIC_OR_DEPARTMENT_OTHER): Payer: Medicaid Other

## 2019-11-08 DIAGNOSIS — J449 Chronic obstructive pulmonary disease, unspecified: Secondary | ICD-10-CM | POA: Diagnosis not present

## 2019-11-08 DIAGNOSIS — G8929 Other chronic pain: Secondary | ICD-10-CM | POA: Insufficient documentation

## 2019-11-08 DIAGNOSIS — Z79899 Other long term (current) drug therapy: Secondary | ICD-10-CM | POA: Insufficient documentation

## 2019-11-08 DIAGNOSIS — J45909 Unspecified asthma, uncomplicated: Secondary | ICD-10-CM | POA: Insufficient documentation

## 2019-11-08 DIAGNOSIS — N3000 Acute cystitis without hematuria: Secondary | ICD-10-CM | POA: Insufficient documentation

## 2019-11-08 DIAGNOSIS — I10 Essential (primary) hypertension: Secondary | ICD-10-CM | POA: Insufficient documentation

## 2019-11-08 DIAGNOSIS — M545 Low back pain: Secondary | ICD-10-CM | POA: Diagnosis present

## 2019-11-08 LAB — URINALYSIS, MICROSCOPIC (REFLEX)

## 2019-11-08 LAB — URINALYSIS, ROUTINE W REFLEX MICROSCOPIC
Bilirubin Urine: NEGATIVE
Glucose, UA: NEGATIVE mg/dL
Ketones, ur: NEGATIVE mg/dL
Nitrite: NEGATIVE
Protein, ur: NEGATIVE mg/dL
Specific Gravity, Urine: 1.025 (ref 1.005–1.030)
pH: 6 (ref 5.0–8.0)

## 2019-11-08 MED ORDER — KETOROLAC TROMETHAMINE 60 MG/2ML IM SOLN
60.0000 mg | Freq: Once | INTRAMUSCULAR | Status: AC
  Administered 2019-11-08: 60 mg via INTRAMUSCULAR
  Filled 2019-11-08: qty 2

## 2019-11-08 MED ORDER — LIDOCAINE 5 % EX PTCH: 1.0000 | MEDICATED_PATCH | CUTANEOUS | 0 refills | Status: DC

## 2019-11-08 MED ORDER — FOSFOMYCIN TROMETHAMINE 3 G PO PACK
3.0000 g | PACK | Freq: Once | ORAL | Status: AC
  Administered 2019-11-08: 3 g via ORAL
  Filled 2019-11-08: qty 3

## 2019-11-08 NOTE — ED Triage Notes (Signed)
Pt c/o mid/lower back pain x 2 months.  Per pt she has been unable to sleep, reports left leg, "Giving out" on her.  Pt has seen her PCP, x-rays obtained however no results were yielded.  Pt ambulatory to room.
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
## 2019-11-12 NOTE — Progress Notes (Deleted)
Cardiology Office Note:   Date:  11/12/2019  NAME:  Susan Hogan    MRN: [id number] DOB:  [birth year]   PCP:  Ladora Daniel, PA-C  Cardiologist:  No primary care provider on file.  Electrophysiologist:  None   Referring MD: Ladora Daniel, PA-C   No chief complaint on file. ***  History of Present Illness:   Susan Hogan is a 59 y.o. female with a hx of hypertension who presents for follow-up of shortness of breath.  Most recent echocardiogram normal.  Cardiac stress test normal.  BNP value noted to be around 22.  Past Medical History: Past Medical History:  Diagnosis Date   Anemia    Arthritis    RA "states does not see Rheumatologist"   Asthma    Carpal tunnel syndrome    bilaterally   Complication of anesthesia    difficulty breathing   COPD (chronic obstructive pulmonary disease) (HCC)    COVID-19    Degenerative disk disease    back   Depression    Diabetes mellitus    Fibromyalgia    GERD (gastroesophageal reflux disease)    Headache(784.0)    migraines   Heart murmur    Hepatitis    In followup treatment for hepatitis C   Hypertension    no medication for at this time, sees Dr. Estevan Oaks Pcp   Hypotensive episode    hx of   Irritable bowel syndrome    Kidney stone    with hematuria   Memory loss    Recurrent upper respiratory infection (URI)    Shortness of breath    Syncope     Past Surgical History: Past Surgical History:  Procedure Laterality Date   CHOLECYSTECTOMY  2019   DILATION AND CURETTAGE OF UTERUS     Left knee surgery     LIVER BIOPSY     TONSILLECTOMY     TUBAL LIGATION      Current Medications: No outpatient medications have been marked as taking for the 11/13/19 encounter (Appointment) with Sande Rives, MD.     Allergies:    Anesthetic ether [ether], Nubain [nalbuphine hcl], Penicillins, Lisinopril, Advair diskus [fluticasone-salmeterol], and Lactose intolerance (gi)   Social  History: Social History   Socioeconomic History   Marital status: Single    Spouse name: Not on file   Number of children: 7   Years of education: Not on file   Highest education level: Not on file  Occupational History   Occupation: Disabled  Tobacco Use   Smoking status: Never Smoker   Smokeless tobacco: Never Used  Substance and Sexual Activity   Alcohol use: Never    Alcohol/week: 0.0 standard drinks   Drug use: Never   Sexual activity: Not on file  Other Topics Concern   Not on file  Social History Narrative   Lives at home. Has two sons and her grandchildren with her.    Right handed   Caffeine: very rare   Social Determinants of Health   Financial Resource Strain:    Difficulty of Paying Living Expenses: Not on file  Food Insecurity:    Worried About Programme researcher, broadcasting/film/video in the Last Year: Not on file   [employer] of Food in the Last Year: Not on file  Transportation Needs:    Lack of Transportation (Medical): Not on file   Lack of Transportation (Non-Medical): Not on file  Physical Activity:    Days of Exercise per Week: Not on file  Minutes of Exercise per Session: Not on file  Stress:    Feeling of Stress : Not on file  Social Connections:    Frequency of Communication with Friends and Family: Not on file   Frequency of Social Gatherings with Friends and Family: Not on file   Attends Religious Services: Not on file   Active Member of Clubs or Organizations: Not on file   Attends Archivist Meetings: Not on file   Marital Status: Not on file     Family History: The patient's ***family history includes Anemia in her mother; Asthma in her maternal grandmother and mother; Cerebral aneurysm in her mother; Colon cancer in her maternal uncle; Diabetes in her daughter, granddaughter, maternal grandmother, and mother; Heart Problems in her maternal grandmother and another family member; Heart disease in her maternal grandmother,  maternal uncle, and mother; Kidney failure in her mother; Migraines in her mother; Parkinson's disease in her mother.  ROS:   All other ROS reviewed and negative. Pertinent positives noted in the HPI.     EKGs/Labs/Other Studies Reviewed:   The following studies were personally reviewed by me today:  EKG:  EKG is *** ordered today.  The ekg ordered today demonstrates ***, and was personally reviewed by me.   NM Stress 10/19/2019 No reversible ischemia. LVEF 69% with normal wall motion. This is a low risk study.  TTE 10/19/2019 1. Left ventricular ejection fraction, by visual estimation, is 60 to  65%. The left ventricle has normal function. There is mildly increased  left ventricular hypertrophy.  2. Elevated left atrial pressure.  3. Left ventricular diastolic parameters are consistent with Grade I  diastolic dysfunction (impaired relaxation).  4. The left ventricle has no regional wall motion abnormalities.  5. Global right ventricle has normal systolic function.The right  ventricular size is normal.  6. Left atrial size was normal.  7. Right atrial size was normal.  8. Mild mitral annular calcification.  9. The mitral valve is normal in structure. No evidence of mitral valve  regurgitation. No evidence of mitral stenosis.  10. The tricuspid valve is normal in structure. Tricuspid valve  regurgitation is trivial.  11. The aortic valve is tricuspid. Aortic valve regurgitation is not  visualized. Mild aortic valve sclerosis without stenosis.  12. The pulmonic valve was normal in structure. Pulmonic valve  regurgitation is not visualized.  13. The inferior vena cava is normal in size with greater than 50%  respiratory variability, suggesting right atrial pressure of 3 mmHg.  14. Normal LV systolic function; grade 1 diastolic dysfunction; mild LVH.  Recent Labs: 10/10/2019: ALT 101; B Natriuretic Peptide 22.4; TSH 1.770 10/15/2019: Magnesium 2.0 10/23/2019: BUN 12;  Creatinine, Ser 0.75; Hemoglobin 14.5; Platelets 212; Potassium 3.8; Sodium 144   Recent Lipid Panel    Component Value Date/Time   CHOL 127 03/25/2018 1537   TRIG 102 08/19/2019 1937   HDL 50 (L) 03/25/2018 1537   CHOLHDL 2.5 03/25/2018 1537   VLDL 25.4 10/22/2016 1451   LDLCALC 60 03/25/2018 1537    Physical Exam:   VS:  LMP  (LMP Unknown)    Wt Readings from Last 3 Encounters:  11/08/19 216 lb (98 kg)  10/18/19 215 lb (97.5 kg)  10/15/19 215 lb (97.5 kg)    General: Well nourished, well developed, in no acute distress Heart: Atraumatic, normal size  Eyes: PEERLA, EOMI  Neck: Supple, no JVD Endocrine: No thryomegaly Cardiac: Normal S1, S2; RRR; no murmurs, rubs, or gallops Lungs:  Clear to auscultation bilaterally, no wheezing, rhonchi or rales  Abd: Soft, nontender, no hepatomegaly  Ext: No edema, pulses 2+ Musculoskeletal: No deformities, BUE and BLE strength normal and equal Skin: Warm and dry, no rashes   Neuro: Alert and oriented to person, place, time, and situation, CNII-XII grossly intact, no focal deficits  Psych: Normal mood and affect   ASSESSMENT:   Susan Hogan is a 59 y.o. female who presents for the following: No diagnosis found.  PLAN:   There are no diagnoses linked to this encounter.  Disposition: No follow-ups on file.  Medication Adjustments/Labs and Tests Ordered: Current medicines are reviewed at length with the patient today.  Concerns regarding medicines are outlined above.  No orders of the defined types were placed in this encounter.  No orders of the defined types were placed in this encounter.   There are no Patient Instructions on file for this visit.   Signed, Lenna Gilford. Flora Lipps, MD [facility]  [address], [address] [place], [place] [address] [phone]  11/12/2019 4:05 PM

## 2019-11-13 ENCOUNTER — Ambulatory Visit: Payer: Medicaid Other | Admitting: Cardiovascular Disease

## 2019-11-14 ENCOUNTER — Encounter: Payer: Self-pay | Admitting: Pulmonary Disease

## 2019-11-14 ENCOUNTER — Other Ambulatory Visit: Payer: Self-pay

## 2019-11-14 ENCOUNTER — Ambulatory Visit: Payer: Medicaid Other | Admitting: Pulmonary Disease

## 2019-11-14 VITALS — BP 134/74 | HR 69 | Temp 97.3°F | Ht 59.0 in | Wt 215.8 lb

## 2019-11-14 DIAGNOSIS — R0602 Shortness of breath: Secondary | ICD-10-CM | POA: Diagnosis not present

## 2019-11-14 MED ORDER — ALBUTEROL SULFATE HFA 108 (90 BASE) MCG/ACT IN AERS: 1.0000 | INHALATION_SPRAY | Freq: Four times a day (QID) | RESPIRATORY_TRACT | 5 refills | Status: DC | PRN

## 2019-11-14 MED ORDER — ANORO ELLIPTA 62.5-25 MCG/INH IN AEPB: 1.0000 | INHALATION_SPRAY | Freq: Every day | RESPIRATORY_TRACT | 0 refills | Status: DC

## 2019-11-14 NOTE — Patient Instructions (Addendum)
Shortness of breath on exertion History of asthma/COPD  Trial with Anoro, 1 puff daily Continue albuterol as needed  Graded exercise to continue your weight loss efforts  Follow-up with a sleep doctor for evaluation and management of your possible sleep apnea  I will see you back in about 6 weeks  Call with any significant concerns  We will schedule you for a breathing study

## 2019-11-14 NOTE — Progress Notes (Signed)
Subjective:    Patient ID: Susan Hogan, female    DOB: 1961/08/17, 59 y.o.   MRN: [id number]  Patient being evaluated for shortness of breath  She was treated for Covid in November 2020 Continues to have some shortness of breath, occasional cough General chest discomfort  She is known to snore She does have an appointment to be evaluated for possible sleep apnea  She has a past history of asthma Labile of COPD-was exposed to a lot of secondhand smoke in the past She has type 2 diabetes, hypertension A lot of musculoskeletal pain and discomfort  Morbidly obese although she has managed to come down from about 300 pounds to about 215 currently  She stated that she was told by her mom when she was younger that she had pneumonia when she was very young and developed scarring in the left lung  Past Medical History:  Diagnosis Date  . Anemia   . Arthritis    RA "states does not see Rheumatologist"  . Asthma   . Carpal tunnel syndrome    bilaterally  . Complication of anesthesia    difficulty breathing  . COPD (chronic obstructive pulmonary disease) (HCC)   . COVID-19   . Degenerative disk disease    back  . Depression   . Diabetes mellitus   . Fibromyalgia   . GERD (gastroesophageal reflux disease)   . Headache(784.0)    migraines  . Heart murmur   . Hepatitis    In followup treatment for hepatitis C  . Hypertension    no medication for at this time, sees Dr. Estevan Oaks Pcp  . Hypotensive episode    hx of  . Irritable bowel syndrome   . Kidney stone    with hematuria  . Memory loss   . Recurrent upper respiratory infection (URI)   . Shortness of breath   . Syncope    Social History   Socioeconomic History  . Marital status: Single    Spouse name: Not on file  . Number of children: 7  . Years of education: Not on file  . Highest education level: Not on file  Occupational History  . Occupation: Disabled  Tobacco Use  . Smoking status: Never Smoker   . Smokeless tobacco: Never Used  Substance and Sexual Activity  . Alcohol use: Never    Alcohol/week: 0.0 standard drinks  . Drug use: Never  . Sexual activity: Not on file  Other Topics Concern  . Not on file  Social History Narrative   Lives at home. Has two sons and her grandchildren with her.    Right handed   Caffeine: very rare   Social Determinants of Health   Financial Resource Strain:   . Difficulty of Paying Living Expenses: Not on file  Food Insecurity:   . Worried About Programme researcher, broadcasting/film/video in the Last Year: Not on file  . Ran Out of Food in the Last Year: Not on file  Transportation Needs:   . Lack of Transportation (Medical): Not on file  . Lack of Transportation (Non-Medical): Not on file  Physical Activity:   . Days of Exercise per Week: Not on file  . Minutes of Exercise per Session: Not on file  Stress:   . Feeling of Stress : Not on file  Social Connections:   . Frequency of Communication with Friends and Family: Not on file  . Frequency of Social Gatherings with Friends and Family: Not on file  .  Attends Religious Services: Not on file  . Active Member of Clubs or Organizations: Not on file  . Attends Archivist Meetings: Not on file  . Marital Status: Not on file  Intimate Partner Violence:   . Fear of Current or Ex-Partner: Not on file  . Emotionally Abused: Not on file  . Physically Abused: Not on file  . Sexually Abused: Not on file   Family History  Problem Relation Age of Onset  . Heart disease Mother   . Diabetes Mother   . Asthma Mother   . Anemia Mother   . Cerebral aneurysm Mother        ruptured  . Kidney failure Mother   . Migraines Mother   . Parkinson's disease Mother   . Colon cancer Maternal Uncle   . Heart disease Maternal Uncle   . Heart disease Maternal Grandmother   . Diabetes Maternal Grandmother   . Asthma Maternal Grandmother   . Heart Problems Maternal Grandmother   . Heart Problems Other        through  family  . Diabetes Granddaughter   . Diabetes Daughter    Review of Systems  Constitutional: Negative for fever and unexpected weight change.  HENT: Positive for congestion, dental problem, ear pain and trouble swallowing. Negative for nosebleeds, postnasal drip, rhinorrhea, sinus pressure, sneezing and sore throat.   Eyes: Negative for redness and itching.  Respiratory: Positive for cough, chest tightness and shortness of breath. Negative for wheezing.   Cardiovascular: Negative for palpitations and leg swelling.  Gastrointestinal: Negative for nausea and vomiting.  Genitourinary: Negative for dysuria.  Musculoskeletal: Positive for joint swelling.  Skin: Negative for rash.  Allergic/Immunologic: Negative.  Negative for environmental allergies, food allergies and immunocompromised state.  Neurological: Negative for headaches.  Hematological: Does not bruise/bleed easily.  Psychiatric/Behavioral: Positive for dysphoric mood. The patient is not nervous/anxious.       Objective:   Physical Exam Constitutional:      Appearance: She is obese.  HENT:     Head: Normocephalic and atraumatic.     Nose: Nose normal.     Mouth/Throat:     Mouth: Mucous membranes are moist.  Eyes:     Pupils: Pupils are equal, round, and reactive to light.  Cardiovascular:     Rate and Rhythm: Normal rate and regular rhythm.     Heart sounds: Normal heart sounds. No murmur. No friction rub.  Pulmonary:     Effort: Pulmonary effort is normal. No respiratory distress.     Breath sounds: Normal breath sounds. No stridor. No wheezing or rhonchi.  Musculoskeletal:        General: Normal range of motion.     Cervical back: Normal range of motion and neck supple. No rigidity.  Lymphadenopathy:     Cervical: No cervical adenopathy.  Skin:    General: Skin is warm.  Neurological:     General: No focal deficit present.     Mental Status: She is alert.  Psychiatric:        Mood and Affect: Mood normal.     Vitals:   11/14/19 1451  BP: 134/74  Pulse: 69  Temp: (!) 97.3 F (36.3 C)  SpO2: 96%      Assessment & Plan:  Shortness of breath -Likely multifactorial -We will obtain a pulmonary function study -She did try Breo in the past-developed hives -She will continue using albuterol as needed, currently she is using albuterol daily -We will try her  on Anoro  Moderate probability of significant obstructive sleep apnea -She states she has an appointment to be evaluated  Obesity -Importance of regular exercise, diet  Plan: Obtain PFT Trial with Anoro Continue albuterol as needed  I will see her back in the office in about 6 weeks  .  Encouraged to call with any significant concerns

## 2019-11-17 ENCOUNTER — Emergency Department (HOSPITAL_COMMUNITY): Payer: Medicaid Other

## 2019-11-17 ENCOUNTER — Encounter (HOSPITAL_COMMUNITY): Payer: Self-pay | Admitting: Emergency Medicine

## 2019-11-17 ENCOUNTER — Other Ambulatory Visit: Payer: Self-pay

## 2019-11-17 ENCOUNTER — Emergency Department (HOSPITAL_COMMUNITY)
Admission: EM | Admit: 2019-11-17 | Discharge: 2019-11-17 | Disposition: A | Discharge status: Home / Self Care | Payer: Medicaid Other | Attending: Emergency Medicine | Admitting: Emergency Medicine

## 2019-11-17 DIAGNOSIS — R519 Headache, unspecified: Secondary | ICD-10-CM | POA: Diagnosis present

## 2019-11-17 DIAGNOSIS — I10 Essential (primary) hypertension: Secondary | ICD-10-CM | POA: Diagnosis not present

## 2019-11-17 DIAGNOSIS — R110 Nausea: Secondary | ICD-10-CM | POA: Diagnosis not present

## 2019-11-17 DIAGNOSIS — Z8616 Personal history of COVID-19: Secondary | ICD-10-CM | POA: Insufficient documentation

## 2019-11-17 DIAGNOSIS — E119 Type 2 diabetes mellitus without complications: Secondary | ICD-10-CM | POA: Insufficient documentation

## 2019-11-17 DIAGNOSIS — B182 Chronic viral hepatitis C: Secondary | ICD-10-CM | POA: Diagnosis not present

## 2019-11-17 DIAGNOSIS — J449 Chronic obstructive pulmonary disease, unspecified: Secondary | ICD-10-CM | POA: Insufficient documentation

## 2019-11-17 DIAGNOSIS — Z79899 Other long term (current) drug therapy: Secondary | ICD-10-CM | POA: Insufficient documentation

## 2019-11-17 DIAGNOSIS — R202 Paresthesia of skin: Secondary | ICD-10-CM | POA: Insufficient documentation

## 2019-11-17 LAB — BASIC METABOLIC PANEL
Anion gap: 11 (ref 5–15)
BUN: 19 mg/dL (ref 8–23)
CO2: 26 mmol/L (ref 22–32)
Calcium: 9.6 mg/dL (ref 8.9–10.3)
Chloride: 101 mmol/L (ref 98–111)
Creatinine, Ser: 0.78 mg/dL (ref 0.44–1.00)
GFR calc Af Amer: >60 mL/min (ref >60)
GFR calc non Af Amer: >60 mL/min (ref >60)
Glucose, Bld: 135 mg/dL — ABNORMAL HIGH (ref 70–99)
Potassium: 3.8 mmol/L (ref 3.5–5.1)
Sodium: 138 mmol/L (ref 135–145)

## 2019-11-17 LAB — URINALYSIS, ROUTINE W REFLEX MICROSCOPIC
Bacteria, UA: NONE SEEN
Bilirubin Urine: NEGATIVE
Glucose, UA: NEGATIVE mg/dL
Ketones, ur: NEGATIVE mg/dL
Leukocytes,Ua: NEGATIVE
Nitrite: NEGATIVE
Protein, ur: NEGATIVE mg/dL
Specific Gravity, Urine: 1.005 (ref 1.005–1.030)
pH: 7 (ref 5.0–8.0)

## 2019-11-17 LAB — CBC WITH DIFFERENTIAL/PLATELET
Abs Immature Granulocytes: 0.03 10*3/uL (ref 0.00–0.07)
Basophils Absolute: 0 10*3/uL (ref 0.0–0.1)
Basophils Relative: 0 %
Eosinophils Absolute: 0.1 10*3/uL (ref 0.0–0.5)
Eosinophils Relative: 1 %
HCT: 43.2 % (ref 36.0–46.0)
Hemoglobin: 14.7 g/dL (ref 12.0–15.0)
Immature Granulocytes: 0 %
Lymphocytes Relative: 29 %
Lymphs Abs: 3 10*3/uL (ref 0.7–4.0)
MCH: 33.4 pg (ref 26.0–34.0)
MCHC: 34 g/dL (ref 30.0–36.0)
MCV: 98.2 fL (ref 80.0–100.0)
Monocytes Absolute: 0.8 10*3/uL (ref 0.1–1.0)
Monocytes Relative: 8 %
Neutro Abs: 6.3 10*3/uL (ref 1.7–7.7)
Neutrophils Relative %: 62 %
Platelets: 191 10*3/uL (ref 150–400)
RBC: 4.4 MIL/uL (ref 3.87–5.11)
RDW: 12.6 % (ref 11.5–15.5)
WBC: 10.3 10*3/uL (ref 4.0–10.5)
nRBC: 0 % (ref 0.0–0.2)

## 2019-11-17 MED ORDER — KETOROLAC TROMETHAMINE 30 MG/ML IJ SOLN
30.0000 mg | Freq: Once | INTRAMUSCULAR | Status: AC
  Administered 2019-11-17: 30 mg via INTRAVENOUS
  Filled 2019-11-17: qty 1

## 2019-11-17 MED ORDER — PROCHLORPERAZINE EDISYLATE 10 MG/2ML IJ SOLN
10.0000 mg | Freq: Once | INTRAMUSCULAR | Status: AC
  Administered 2019-11-17: 05:00:00 10 mg via INTRAVENOUS
  Filled 2019-11-17: qty 2

## 2019-11-17 MED ORDER — SODIUM CHLORIDE 0.9 % IV BOLUS
1000.0000 mL | Freq: Once | INTRAVENOUS | Status: AC
  Administered 2019-11-17: 1000 mL via INTRAVENOUS

## 2019-11-17 NOTE — ED Triage Notes (Signed)
Patient arrived with EMS from home reports migraine headache this evening with mild nausea , no photophobia or fever .

## 2019-11-17 NOTE — ED Provider Notes (Signed)
[facility] EMERGENCY DEPARTMENT Provider Note   CSN: [id number] Arrival date & time: 11/17/19  3474     History Chief Complaint  Patient presents with  . Migraine    Arion Barga is a 59 y.o. female.  Patient presents to the emergency department for evaluation of headache.  Patient reports a sharp pain on the top of her head that has been ongoing for 2 hours.  She reports that the pain waxes and wanes.  No vision change.  She did feel mild nausea with a headache.  She has not had any fever, neck pain or stiffness.  Patient reports that after the onset of the pain she started having some tingling in both hands and both feet.        Past Medical History:  Diagnosis Date  . Anemia   . Arthritis    RA "states does not see Rheumatologist"  . Asthma   . Carpal tunnel syndrome    bilaterally  . Complication of anesthesia    difficulty breathing  . COPD (chronic obstructive pulmonary disease) ([place])   . COVID-19   . Degenerative disk disease    back  . Depression   . Diabetes mellitus   . Fibromyalgia   . GERD (gastroesophageal reflux disease)   . Headache(784.0)    migraines  . Heart murmur   . Hepatitis    In followup treatment for hepatitis C  . Hypertension    no medication for at this time, sees Dr. Dinah Beers Pcp  . Hypotensive episode    hx of  . Irritable bowel syndrome   . Kidney stone    with hematuria  . Memory loss   . Recurrent upper respiratory infection (URI)   . Shortness of breath   . Syncope     Patient Active Problem List   Diagnosis Date Noted  . Chronic migraine without aura, with intractable migraine, so stated, with status migrainosus 03/01/2019  . Medication overuse headache 03/01/2019  . Seasonal allergic rhinitis due to pollen 03/25/2018  . Adnexal cyst 03/25/2018  . Morbid obesity ([place]) 03/25/2018  . Essential hypertension 03/25/2018  . Pure hypercholesterolemia 03/25/2018  . Sleep-disordered breathing  03/25/2018  . Vitamin D deficiency 03/25/2018  . Bilateral lower extremity edema 03/25/2018  . Acute meniscal tear of left knee 11/24/2016  . Chronic hepatitis C without hepatic coma ([place]) 11/24/2016  . Fatty liver 11/24/2016  . H/O degenerative disc disease 11/24/2016  . COPD (chronic obstructive pulmonary disease) ([place]) 10/22/2016  . Type 2 diabetes mellitus, cannot tolerate Metformin 07/25/2014  . Fibromyalgia 07/25/2014  . MDD (major depressive disorder), recurrent severe, without psychosis ([place]) 03/17/2013  . Hepatitis C 08/02/2007  . IBS (irritable bowel syndrome) 08/02/2007  . Mild persistent asthma with acute exacerbation 09/21/1962    Past Surgical History:  Procedure Laterality Date  . CHOLECYSTECTOMY  2019  . DILATION AND CURETTAGE OF UTERUS    . Left knee surgery    . LIVER BIOPSY    . TONSILLECTOMY    . TUBAL LIGATION       OB History   No obstetric history on file.     Family History  Problem Relation Age of Onset  . Heart disease Mother   . Diabetes Mother   . Asthma Mother   . Anemia Mother   . Cerebral aneurysm Mother        ruptured  . Kidney failure Mother   . Migraines Mother   . Parkinson's disease  Mother   . Colon cancer Maternal Uncle   . Heart disease Maternal Uncle   . Heart disease Maternal Grandmother   . Diabetes Maternal Grandmother   . Asthma Maternal Grandmother   . Heart Problems Maternal Grandmother   . Heart Problems Other        through family  . Diabetes Granddaughter   . Diabetes Daughter     Social History   Tobacco Use  . Smoking status: Never Smoker  . Smokeless tobacco: Never Used  Substance Use Topics  . Alcohol use: Never    Alcohol/week: 0.0 standard drinks  . Drug use: Never    Home Medications Prior to Admission medications   Medication Sig Start Date End Date Taking? Authorizing Provider  acetic acid 2 % otic solution Place 4 drops into the left ear 4 (four) times daily. 10/30/19   McDonald, Mia A, PA-C   albuterol (VENTOLIN HFA) 108 (90 Base) MCG/ACT inhaler Inhale 1-2 puffs into the lungs every 6 (six) hours as needed for wheezing or shortness of breath. 11/14/19   Olalere, Cicero Duck A, MD  amLODipine (NORVASC) 10 MG tablet Take 1 tablet (10 mg total) by mouth daily. 10/10/19 01/08/20  O'Neal, Cassie Freer, MD  blood glucose meter kit and supplies KIT Dispense based on patient and insurance preference. Use up to four times daily as directed. (FOR ICD-9 250.00, 250.01). 03/25/18   Briscoe Deutscher, DO  glucose blood (ACCU-CHEK ACTIVE STRIPS) test strip Use as instructed 04/15/16   Nona Dell, PA-C  lidocaine (LIDODERM) 5 % Place 1 patch onto the skin daily. Remove & Discard patch within 12 hours or as directed by MD 11/08/19   Randal Buba, April, MD  losartan (COZAAR) 50 MG tablet Take 50 mg by mouth daily. 10/11/19   [provider]  methocarbamol (ROBAXIN) 500 MG tablet Take 1 tablet (500 mg total) by mouth every 8 (eight) hours as needed for muscle spasms (back pain). 10/14/19   Mesner, Corene Cornea, MD  potassium chloride SA (KLOR-CON) 20 MEQ tablet Take 20 mEq by mouth daily. 10/11/19   [provider]  umeclidinium-vilanterol (ANORO ELLIPTA) 62.5-25 MCG/INH AEPB Inhale 1 puff into the lungs daily. 11/14/19   Laurin Coder, MD  cetirizine (ZYRTEC ALLERGY) 10 MG tablet Take 1 tablet (10 mg total) by mouth daily. 09/24/19 10/10/19  Petrucelli, Glynda Jaeger, PA-C  dicyclomine (BENTYL) 20 MG tablet Take 1 tablet (20 mg total) by mouth 2 (two) times daily. Patient not taking: Reported on 10/01/2019 08/19/19 10/01/19  Lorayne Bender, PA-C  diphenhydrAMINE (BENADRYL) 25 MG tablet Take 1 tablet (25 mg total) by mouth every 6 (six) hours as needed. Patient not taking: Reported on 10/01/2019 09/24/19 10/01/19  Petrucelli, Aldona Bar R, PA-C  escitalopram (LEXAPRO) 10 MG tablet TAKE 1 TABLET(10 MG) BY MOUTH DAILY Patient not taking: Reported on 07/05/2018 06/01/18 09/24/19  Briscoe Deutscher, DO  labetalol  (NORMODYNE) 100 MG tablet Take 1 tablet (100 mg total) by mouth 2 (two) times daily. Patient not taking: Reported on 10/01/2019 08/30/19 10/01/19  Milton Ferguson, MD  prochlorperazine (COMPAZINE) 10 MG tablet Take 1 tablet (10 mg total) by mouth every 6 (six) hours as needed. May take with '25mg'$  of benadryl for migraines.. Patient not taking: Reported on 10/01/2019 03/09/19 10/01/19  Melvenia Beam, MD  rizatriptan (MAXALT-MLT) 10 MG disintegrating tablet Take 1 tablet (10 mg total) by mouth as needed for migraine. May repeat in 2 hours if needed. Maximum 2x in one day. Patient not taking: Reported  on 10/14/2019 03/16/19 10/14/19  Melvenia Beam, MD  topiramate (TOPAMAX) 100 MG tablet Take 1 tablet (100 mg total) by mouth at bedtime. Patient not taking: Reported on 10/01/2019 03/09/19 10/01/19  Melvenia Beam, MD    Allergies    Anesthetic ether [ether], Nubain [nalbuphine hcl], Penicillins, Lisinopril, Advair diskus [fluticasone-salmeterol], and Lactose intolerance (gi)  Review of Systems   Review of Systems  Constitutional: Negative for fever.  Eyes: Negative for photophobia and visual disturbance.  Musculoskeletal: Negative for neck pain.  Neurological: Positive for headaches. Negative for weakness.  All other systems reviewed and are negative.   Physical Exam Updated Vital Signs BP 138/87 (BP Location: Left Arm)   Pulse 75   Temp 98.2 F (36.8 C) (Oral)   Resp 20   Ht '4\' 11"'$  (1.499 m)   Wt 106 kg   LMP  (LMP Unknown)   SpO2 100%   BMI 47.20 kg/m   Physical Exam Vitals and nursing note reviewed.  Constitutional:      General: She is not in acute distress.    Appearance: Normal appearance. She is well-developed.  HENT:     Head: Normocephalic and atraumatic.     Right Ear: Hearing normal.     Left Ear: Hearing normal.     Nose: Nose normal.  Eyes:     Conjunctiva/sclera: Conjunctivae normal.     Pupils: Pupils are equal, round, and reactive to light.  Cardiovascular:      Rate and Rhythm: Regular rhythm.     Heart sounds: S1 normal and S2 normal. No murmur. No friction rub. No gallop.   Pulmonary:     Effort: Pulmonary effort is normal. No respiratory distress.     Breath sounds: Normal breath sounds.  Chest:     Chest wall: No tenderness.  Abdominal:     General: Bowel sounds are normal.     Palpations: Abdomen is soft.     Tenderness: There is no abdominal tenderness. There is no guarding or rebound. Negative signs include Murphy's sign and McBurney's sign.     Hernia: No hernia is present.  Musculoskeletal:        General: Normal range of motion.     Cervical back: Normal range of motion and neck supple.  Skin:    General: Skin is warm and dry.     Findings: No rash.  Neurological:     Mental Status: She is alert and oriented to person, place, and time.     GCS: GCS eye subscore is 4. GCS verbal subscore is 5. GCS motor subscore is 6.     Cranial Nerves: No cranial nerve deficit.     Sensory: No sensory deficit.     Coordination: Coordination normal.  Psychiatric:        Speech: Speech normal.        Behavior: Behavior normal.        Thought Content: Thought content normal.     ED Results / Procedures / Treatments   Labs (all labs ordered are listed, but only abnormal results are displayed) Labs Reviewed  BASIC METABOLIC PANEL - Abnormal; Notable for the following components:      Result Value   Glucose, Bld 135 (*)    All other components within normal limits  URINALYSIS, ROUTINE W REFLEX MICROSCOPIC - Abnormal; Notable for the following components:   Color, Urine STRAW (*)    Hgb urine dipstick MODERATE (*)    All other components within normal limits  CBC WITH DIFFERENTIAL/PLATELET    EKG None  Radiology CT HEAD WO CONTRAST  Result Date: 11/17/2019 CLINICAL DATA:  Headache, normal neurologic exam EXAM: CT HEAD WITHOUT CONTRAST TECHNIQUE: Contiguous axial images were obtained from the base of the skull through the vertex  without intravenous contrast. COMPARISON:  CT head 11/02/2018 FINDINGS: Brain: No evidence of acute infarction, hemorrhage, hydrocephalus, extra-axial collection or mass lesion/mass effect. Symmetric prominence of the ventricles, cisterns and sulci compatible with parenchymal volume loss. Patchy areas of white matter hypoattenuation are most compatible with chronic microvascular angiopathy. Dural calcifications are similar to prior. Vascular: Atherosclerotic calcification of the carotid siphons. No hyperdense vessel. Skull: No calvarial fracture or suspicious osseous lesion. No scalp swelling or hematoma. Sinuses/Orbits: Paranasal sinuses and mastoid air cells are predominantly clear. Included orbital structures are unremarkable. Other: None IMPRESSION: No acute intracranial abnormality. Minimal chronic microvascular angiopathy and parenchymal volume loss similar to prior. Electronically Signed   By: Lovena Le M.D.   On: 11/17/2019 04:42    Procedures Procedures (including critical care time)  Medications Ordered in ED Medications  ketorolac (TORADOL) 30 MG/ML injection 30 mg (has no administration in time range)  prochlorperazine (COMPAZINE) injection 10 mg (has no administration in time range)  sodium chloride 0.9 % bolus 1,000 mL (1,000 mLs Intravenous New Bag/Given 11/17/19 0981)    ED Course  I have reviewed the triage vital signs and the nursing notes.  Pertinent labs & imaging results that were available during my care of the patient were reviewed by me and considered in my medical decision making (see chart for details).    MDM Rules/Calculators/A&P                      Patient presents to the emergency department for evaluation of headache.  Symptoms began approximately 2 hours before coming to the ER.  She does report that the headache was somewhat sudden in onset but does not last.  Pain comes and goes over a period of minutes.  She is in no distress.  Her neurologic exam is  normal.  The CT was performed less than 6 hours after onset of symptoms and was negative.  This coupled with her atypical symptoms and normal neurologic exam is felt to be adequate to rule out intracranial bleed as a cause of her symptoms. Final Clinical Impression(s) / ED Diagnoses Final diagnoses:  Bad headache    Rx / DC Orders ED Discharge Orders    None       Zaiyah Sottile, Gwenyth Allegra, MD 11/17/19 [phone]

## 2019-11-24 IMAGING — US US ABDOMEN COMPLETE W/ ELASTOGRAPHY
1 series · 13 of 25 positions shown · non-contrast
Comparison: Right upper quadrant ultrasound on 10/12/2016

CLINICAL DATA: Chronic hepatitis-C without hepatic coma. Prior
cholecystectomy.



[Series 1: us abdomen complete w/ elastography · 0.25mm/px · 13 of 76 slices shown]
[im 1/76]
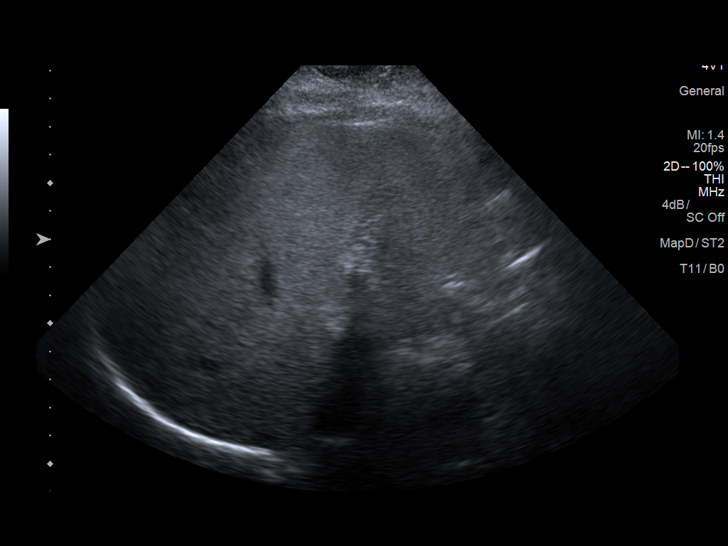
[im 7/76]
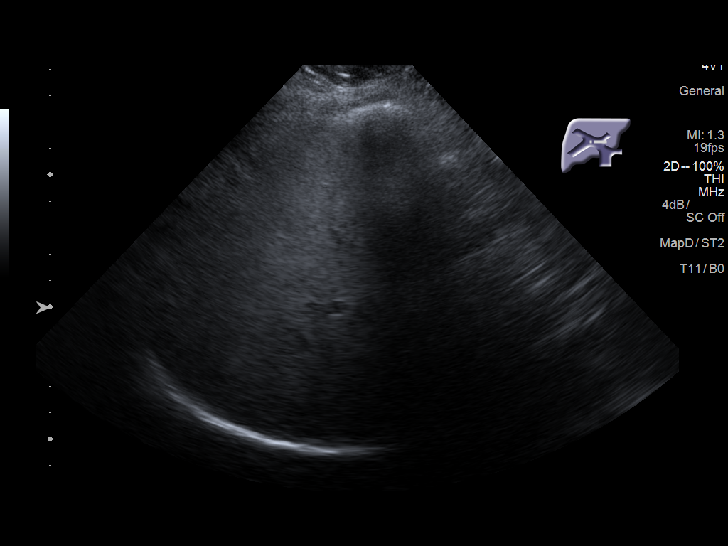
[im 13/76]
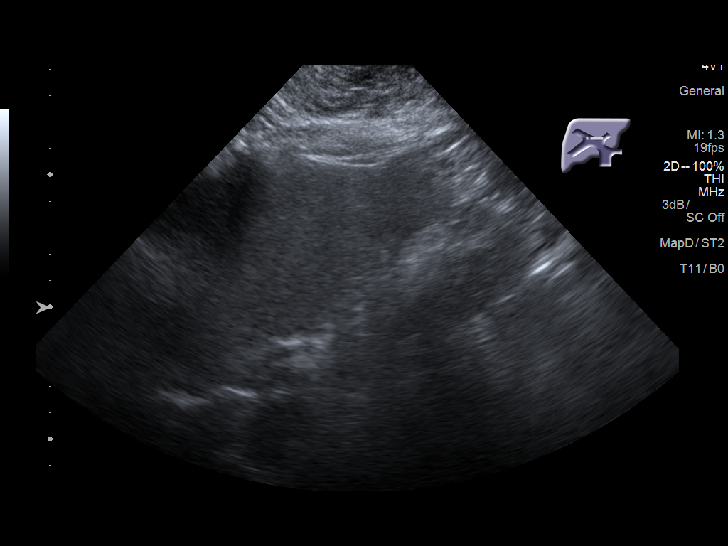
[im 19/76]
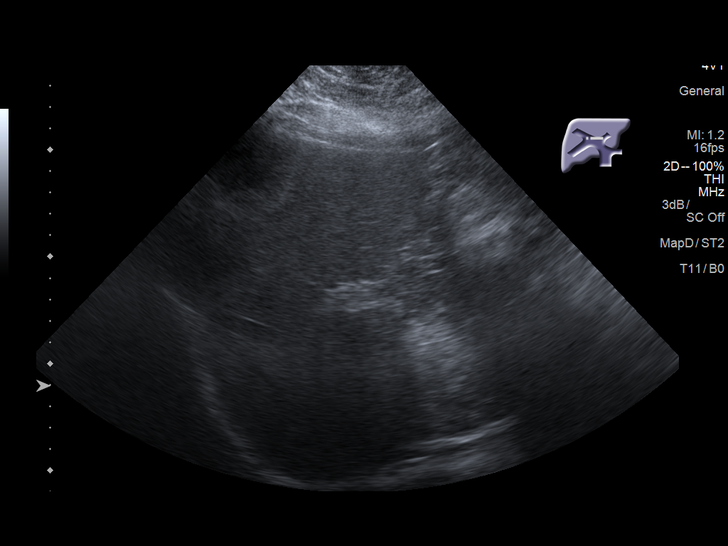
[im 26/76]
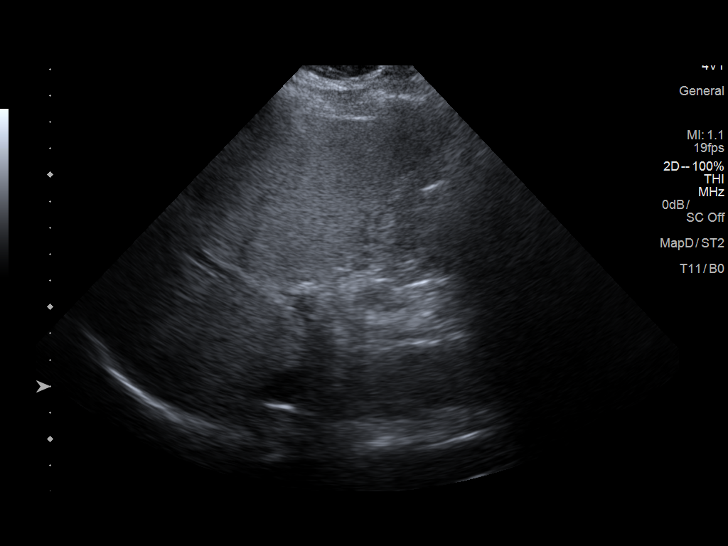
[im 32/76]
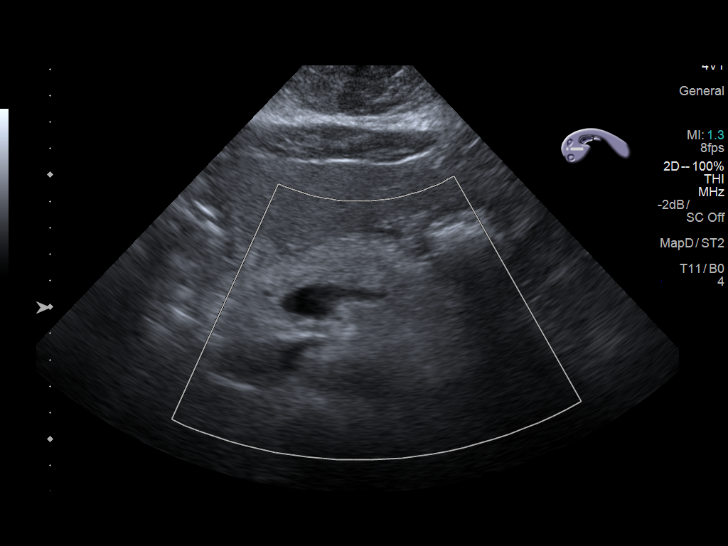
[im 38/76]
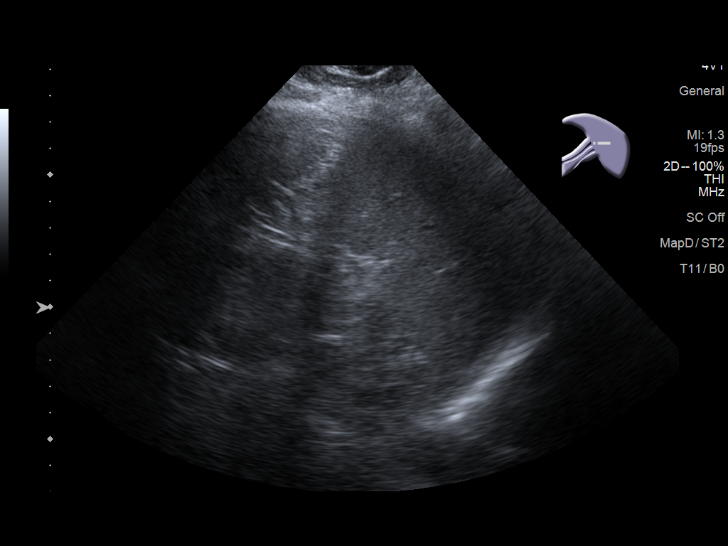
[im 44/76]
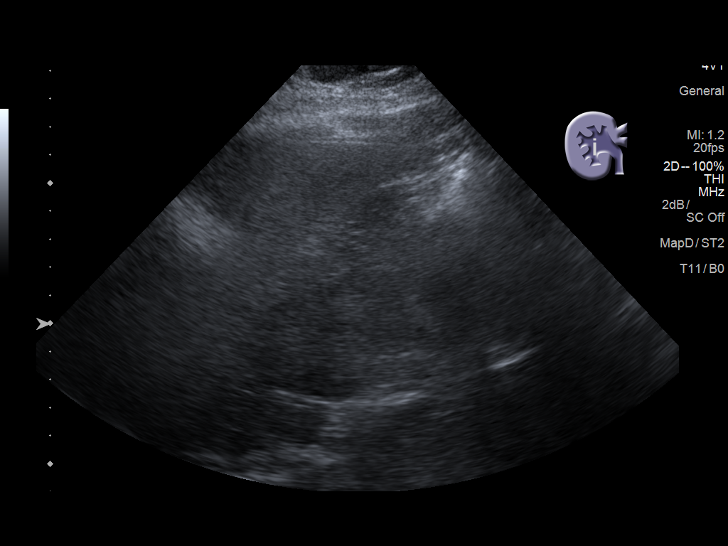
[im 51/76]
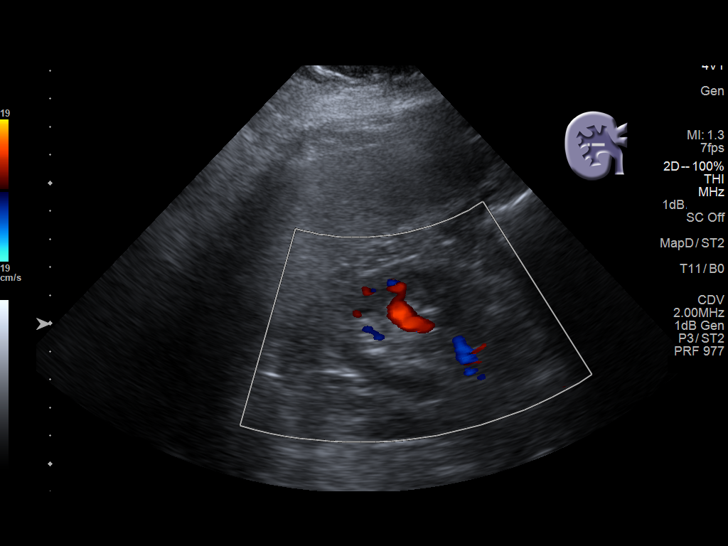
[im 57/76]
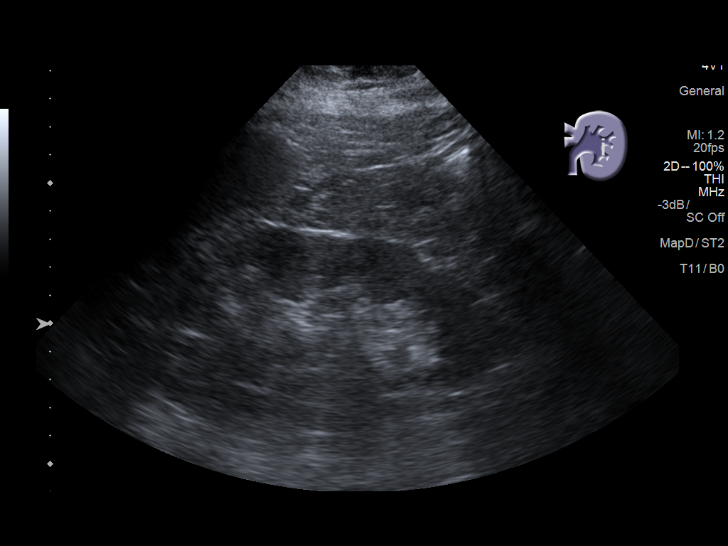
[im 63/76]
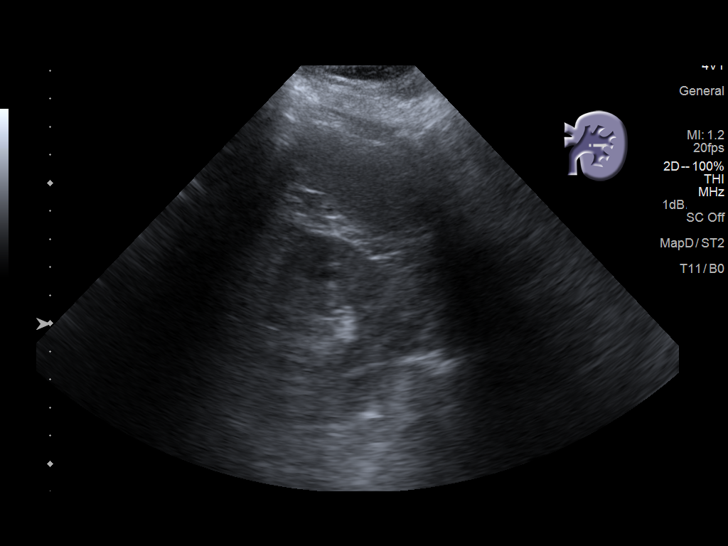
[im 69/76]
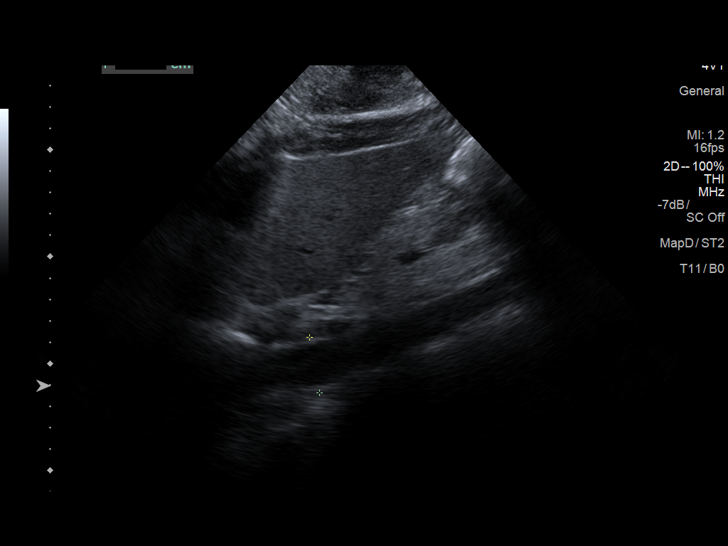
[im 76/76]
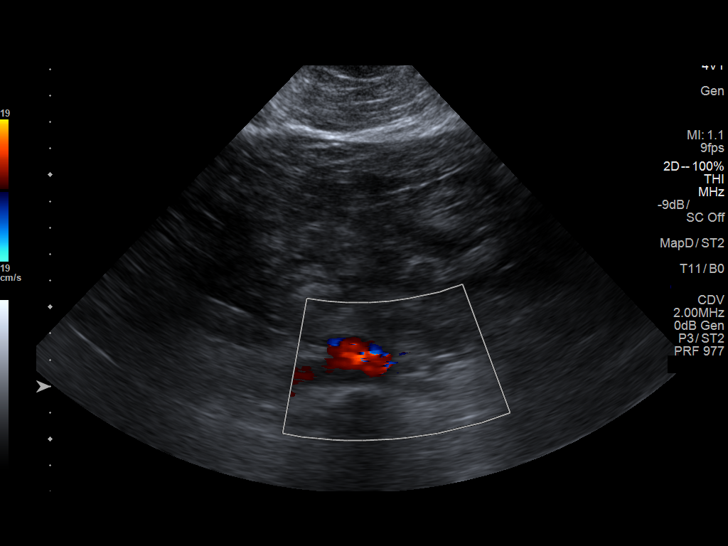

[13 of 25 positions shown; findings below may reference images not displayed]

FINDINGS: ULTRASOUND ABDOMEN

Gallbladder: Surgically absent.

Common bile duct: Diameter: 4 mm, within normal limits.

Liver: Mildly increased echogenicity of the hepatic parenchyma,
without significant change. No focal mass lesion identified. Portal
vein is patent on color Doppler imaging with normal direction of
blood flow towards the liver.

IVC: No abnormality visualized.

Pancreas: Visualized portion unremarkable.

Spleen: Size and appearance within normal limits.

Right Kidney: Length: 12.2 cm. Echogenicity within normal limits. No
mass or hydronephrosis visualized.

Left Kidney: Length: 11.9 cm. Echogenicity within normal limits. No
mass or hydronephrosis visualized.

Abdominal aorta: No aneurysm visualized.

Other findings: None.

ULTRASOUND HEPATIC ELASTOGRAPHY

Device: Siemens Helix VTQ

Patient position: Left Lateral Decubitus

Transducer 4V1

Number of measurements: 10

Hepatic segment:  8

Median velocity:   3.41 m/sec

IQR:

IQR/Median velocity ratio:

Corresponding Metavir fibrosis score:  Some F3 + F4

Risk of fibrosis: High

Limitations of exam: Patient inability to perform breath holding

Please note that abnormal shear wave velocities may also be
identified in clinical settings other than with hepatic fibrosis,
such as: acute hepatitis, elevated right heart and central venous
pressures including use of beta blockers, Lienad disease
([place]), infiltrative processes such as
mastocytosis/amyloidosis/infiltrative tumor, extrahepatic
cholestasis, in the post-prandial state, and liver transplantation.
Correlation with patient history, laboratory data, and clinical
condition recommended.
IMPRESSION: ULTRASOUND ABDOMEN:

Prior cholecystectomy. No evidence of biliary ductal dilatation or
other acute findings.

Stable mildly increased hepatic echogenicity, consistent with
diffuse hepatocellular disease. No liver mass identified.

ULTRASOUND HEPATIC ELASTOGRAPHY:

Median hepatic shear wave velocity is calculated at 3.41 m/sec.

Corresponding Metavir fibrosis score is Some F3 + F4.

Risk of fibrosis is High.

Follow-up: Follow up advised

## 2019-12-05 ENCOUNTER — Other Ambulatory Visit: Payer: Self-pay

## 2019-12-05 ENCOUNTER — Emergency Department (HOSPITAL_COMMUNITY)
Admission: EM | Admit: 2019-12-05 | Discharge: 2019-12-05 | Disposition: A | Discharge status: Home / Self Care | Payer: Medicaid Other | Attending: Emergency Medicine | Admitting: Emergency Medicine

## 2019-12-05 ENCOUNTER — Encounter (HOSPITAL_COMMUNITY): Payer: Self-pay

## 2019-12-05 DIAGNOSIS — Z5321 Procedure and treatment not carried out due to patient leaving prior to being seen by health care provider: Secondary | ICD-10-CM | POA: Diagnosis not present

## 2019-12-05 DIAGNOSIS — M5489 Other dorsalgia: Secondary | ICD-10-CM | POA: Insufficient documentation

## 2019-12-05 LAB — CBG MONITORING, ED: Glucose-Capillary: 159 mg/dL — ABNORMAL HIGH (ref 70–99)

## 2019-12-05 NOTE — ED Notes (Signed)
Pt stated she wants to go home and eloped from department

## 2019-12-05 NOTE — ED Triage Notes (Addendum)
Pt presents to ED for back pain in February. States she is having trouble breathing because she is scared if she falls asleep she will not wake up. Pt continues to say that she sleeps sideways and hasnt slept in 2 days. Now only wants help sleeping.

## 2019-12-20 ENCOUNTER — Other Ambulatory Visit: Payer: Self-pay

## 2019-12-20 ENCOUNTER — Emergency Department (HOSPITAL_BASED_OUTPATIENT_CLINIC_OR_DEPARTMENT_OTHER)
Admission: EM | Admit: 2019-12-20 | Discharge: 2019-12-20 | Disposition: A | Discharge status: Home / Self Care | Payer: Medicaid Other | Attending: Emergency Medicine | Admitting: Emergency Medicine

## 2019-12-20 ENCOUNTER — Encounter (HOSPITAL_BASED_OUTPATIENT_CLINIC_OR_DEPARTMENT_OTHER): Payer: Self-pay | Admitting: Oncology

## 2019-12-20 DIAGNOSIS — J449 Chronic obstructive pulmonary disease, unspecified: Secondary | ICD-10-CM | POA: Insufficient documentation

## 2019-12-20 DIAGNOSIS — Z7984 Long term (current) use of oral hypoglycemic drugs: Secondary | ICD-10-CM | POA: Insufficient documentation

## 2019-12-20 DIAGNOSIS — Z79899 Other long term (current) drug therapy: Secondary | ICD-10-CM | POA: Insufficient documentation

## 2019-12-20 DIAGNOSIS — G43001 Migraine without aura, not intractable, with status migrainosus: Secondary | ICD-10-CM | POA: Diagnosis not present

## 2019-12-20 DIAGNOSIS — Z8616 Personal history of COVID-19: Secondary | ICD-10-CM | POA: Insufficient documentation

## 2019-12-20 DIAGNOSIS — R519 Headache, unspecified: Secondary | ICD-10-CM | POA: Diagnosis present

## 2019-12-20 DIAGNOSIS — I10 Essential (primary) hypertension: Secondary | ICD-10-CM | POA: Insufficient documentation

## 2019-12-20 DIAGNOSIS — E119 Type 2 diabetes mellitus without complications: Secondary | ICD-10-CM | POA: Diagnosis not present

## 2019-12-20 LAB — CBG MONITORING, ED: Glucose-Capillary: 131 mg/dL — ABNORMAL HIGH (ref 70–99)

## 2019-12-20 MED ORDER — ONDANSETRON 8 MG PO TBDP: 8.0000 mg | ORAL_TABLET | Freq: Three times a day (TID) | ORAL | 0 refills | Status: DC | PRN

## 2019-12-20 MED ORDER — PROMETHAZINE HCL 25 MG PO TABS
25.0000 mg | ORAL_TABLET | Freq: Once | ORAL | Status: AC
  Administered 2019-12-20: 25 mg via ORAL
  Filled 2019-12-20: qty 1

## 2019-12-20 MED ORDER — NAPROXEN 250 MG PO TABS
375.0000 mg | ORAL_TABLET | Freq: Once | ORAL | Status: AC
  Administered 2019-12-20: 375 mg via ORAL
  Filled 2019-12-20: qty 2

## 2019-12-20 MED ORDER — ONDANSETRON 4 MG PO TBDP
8.0000 mg | ORAL_TABLET | Freq: Once | ORAL | Status: AC
  Administered 2019-12-20: 8 mg via ORAL
  Filled 2019-12-20: qty 2

## 2019-12-20 MED ORDER — KETOROLAC TROMETHAMINE 30 MG/ML IJ SOLN
30.0000 mg | Freq: Once | INTRAMUSCULAR | Status: AC
  Administered 2019-12-20: 30 mg via INTRAMUSCULAR
  Filled 2019-12-20: qty 1

## 2019-12-20 MED ORDER — ACETAMINOPHEN 500 MG PO TABS
1000.0000 mg | ORAL_TABLET | Freq: Once | ORAL | Status: AC
  Administered 2019-12-20: 1000 mg via ORAL
  Filled 2019-12-20: qty 2

## 2019-12-20 NOTE — ED Triage Notes (Signed)
Pt c/o chronic HA.  States tried OTC w/o relief.

## 2019-12-20 NOTE — ED Provider Notes (Signed)
[facility] EMERGENCY DEPARTMENT Provider Note   CSN: [id number] Arrival date & time: 12/20/19  0313     History Chief Complaint  Patient presents with  . Headache    Susan Hogan is a 59 y.o. female.  HPI     59-year-old comes in a chief complaint of headache.  She has history of migraines, COPD, fibromyalgia and COVID-19 few months back.  Patient reports of headache that started yesterday.  Headache is frontal, throbbing with associated photophobia and phonophobia.  She also has nausea.  Patient has taken over-the-counter medications at home without relief.  She denies any has associated new numbness, weakness, vision changes.  She reports that after she had COVID-19 she has noted increased frequency of headaches.  Her current symptoms are consistent with her migraines.  Past Medical History:  Diagnosis Date  . Anemia   . Arthritis    RA "states does not see Rheumatologist"  . Asthma   . Carpal tunnel syndrome    bilaterally  . Complication of anesthesia    difficulty breathing  . COPD (chronic obstructive pulmonary disease) (HCC)   . COVID-19   . Degenerative disk disease    back  . Depression   . Diabetes mellitus   . Fibromyalgia   . GERD (gastroesophageal reflux disease)   . Headache(784.0)    migraines  . Heart murmur   . Hepatitis    In followup treatment for hepatitis C  . Hypertension    no medication for at this time, sees Dr. Dewight Williams Pcp  . Hypotensive episode    hx of  . Irritable bowel syndrome   . Kidney stone    with hematuria  . Memory loss   . Recurrent upper respiratory infection (URI)   . Shortness of breath   . Syncope     Patient Active Problem List   Diagnosis Date Noted  . Chronic migraine without aura, with intractable migraine, so stated, with status migrainosus 03/01/2019  . Medication overuse headache 03/01/2019  . Seasonal allergic rhinitis due to pollen 03/25/2018  . Adnexal cyst 03/25/2018  . Morbid  obesity (HCC) 03/25/2018  . Essential hypertension 03/25/2018  . Pure hypercholesterolemia 03/25/2018  . Sleep-disordered breathing 03/25/2018  . Vitamin D deficiency 03/25/2018  . Bilateral lower extremity edema 03/25/2018  . Acute meniscal tear of left knee 11/24/2016  . Chronic hepatitis C without hepatic coma (HCC) 11/24/2016  . Fatty liver 11/24/2016  . H/O degenerative disc disease 11/24/2016  . COPD (chronic obstructive pulmonary disease) (HCC) 10/22/2016  . Type 2 diabetes mellitus, cannot tolerate Metformin 07/25/2014  . Fibromyalgia 07/25/2014  . MDD (major depressive disorder), recurrent severe, without psychosis (HCC) 03/17/2013  . Hepatitis C 08/02/2007  . IBS (irritable bowel syndrome) 08/02/2007  . Mild persistent asthma with acute exacerbation 09/21/1962    Past Surgical History:  Procedure Laterality Date  . CHOLECYSTECTOMY  2019  . DILATION AND CURETTAGE OF UTERUS    . Left knee surgery    . LIVER BIOPSY    . TONSILLECTOMY    . TUBAL LIGATION       OB History   No obstetric history on file.     Family History  Problem Relation Age of Onset  . Heart disease Mother   . Diabetes Mother   . Asthma Mother   . Anemia Mother   . Cerebral aneurysm Mother        ruptured  . Kidney failure Mother   . Migraines Mother   .   Parkinson's disease Mother   . Colon cancer Maternal Uncle   . Heart disease Maternal Uncle   . Heart disease Maternal Grandmother   . Diabetes Maternal Grandmother   . Asthma Maternal Grandmother   . Heart Problems Maternal Grandmother   . Heart Problems Other        through family  . Diabetes Granddaughter   . Diabetes Daughter     Social History   Tobacco Use  . Smoking status: Never Smoker  . Smokeless tobacco: Never Used  Substance Use Topics  . Alcohol use: Never    Alcohol/week: 0.0 standard drinks  . Drug use: Never    Home Medications Prior to Admission medications   Medication Sig Start Date End Date Taking?  Authorizing Provider  acetic acid 2 % otic solution Place 4 drops into the left ear 4 (four) times daily. 10/30/19   McDonald, Mia A, PA-C  albuterol (VENTOLIN HFA) 108 (90 Base) MCG/ACT inhaler Inhale 1-2 puffs into the lungs every 6 (six) hours as needed for wheezing or shortness of breath. 11/14/19   Olalere, Adewale A, MD  amLODipine (NORVASC) 10 MG tablet Take 1 tablet (10 mg total) by mouth daily. 10/10/19 01/08/20  O'Neal, Richlawn Thomas, MD  blood glucose meter kit and supplies KIT Dispense based on patient and insurance preference. Use up to four times daily as directed. (FOR ICD-9 250.00, 250.01). 03/25/18   Wallace, Erica, DO  glucose blood (ACCU-CHEK ACTIVE STRIPS) test strip Use as instructed 04/15/16   Nadeau, Nicole Elizabeth, PA-C  lidocaine (LIDODERM) 5 % Place 1 patch onto the skin daily. Remove & Discard patch within 12 hours or as directed by MD 11/08/19   Palumbo, April, MD  losartan (COZAAR) 50 MG tablet Take 50 mg by mouth daily. 10/11/19   [provider]  methocarbamol (ROBAXIN) 500 MG tablet Take 1 tablet (500 mg total) by mouth every 8 (eight) hours as needed for muscle spasms (back pain). 10/14/19   Mesner, Jason, MD  ondansetron (ZOFRAN ODT) 8 MG disintegrating tablet Take 1 tablet (8 mg total) by mouth every 8 (eight) hours as needed for nausea. 12/20/19   , , MD  potassium chloride SA (KLOR-CON) 20 MEQ tablet Take 20 mEq by mouth daily. 10/11/19   [provider]  umeclidinium-vilanterol (ANORO ELLIPTA) 62.5-25 MCG/INH AEPB Inhale 1 puff into the lungs daily. 11/14/19   Olalere, Adewale A, MD  cetirizine (ZYRTEC ALLERGY) 10 MG tablet Take 1 tablet (10 mg total) by mouth daily. 09/24/19 10/10/19  Petrucelli, Samantha R, PA-C  dicyclomine (BENTYL) 20 MG tablet Take 1 tablet (20 mg total) by mouth 2 (two) times daily. Patient not taking: Reported on 10/01/2019 08/19/19 10/01/19  Joy, Shawn C, PA-C  diphenhydrAMINE (BENADRYL) 25 MG tablet Take 1 tablet (25 mg  total) by mouth every 6 (six) hours as needed. Patient not taking: Reported on 10/01/2019 09/24/19 10/01/19  Petrucelli, Samantha R, PA-C  escitalopram (LEXAPRO) 10 MG tablet TAKE 1 TABLET(10 MG) BY MOUTH DAILY Patient not taking: Reported on 07/05/2018 06/01/18 09/24/19  Wallace, Erica, DO  labetalol (NORMODYNE) 100 MG tablet Take 1 tablet (100 mg total) by mouth 2 (two) times daily. Patient not taking: Reported on 10/01/2019 08/30/19 10/01/19  Zammit, Joseph, MD  prochlorperazine (COMPAZINE) 10 MG tablet Take 1 tablet (10 mg total) by mouth every 6 (six) hours as needed. May take with 25mg of benadryl for migraines.. Patient not taking: Reported on 10/01/2019 03/09/19 10/01/19  Ahern, Antonia B, MD  rizatriptan (MAXALT-MLT)   10 MG disintegrating tablet Take 1 tablet (10 mg total) by mouth as needed for migraine. May repeat in 2 hours if needed. Maximum 2x in one day. Patient not taking: Reported on 10/14/2019 03/16/19 10/14/19  Melvenia Beam, MD  topiramate (TOPAMAX) 100 MG tablet Take 1 tablet (100 mg total) by mouth at bedtime. Patient not taking: Reported on 10/01/2019 03/09/19 10/01/19  Melvenia Beam, MD    Allergies    Anesthetic ether [ether], Nubain [nalbuphine hcl], Penicillins, Lisinopril, Advair diskus [fluticasone-salmeterol], and Lactose intolerance (gi)  Review of Systems   Review of Systems  Constitutional: Positive for activity change.  Cardiovascular: Negative for chest pain.  Gastrointestinal: Positive for nausea. Negative for blood in stool and vomiting.  Allergic/Immunologic: Negative for immunocompromised state.  Neurological: Positive for headaches.  Hematological: Does not bruise/bleed easily.  All other systems reviewed and are negative.   Physical Exam Updated Vital Signs BP (!) 171/93 (BP Location: Right Arm)   Pulse 70   Temp 97.7 F (36.5 C) (Oral)   Resp 18   Ht 4' 11" (1.499 m)   Wt 98 kg   LMP  (LMP Unknown)   SpO2 100%   BMI 43.63 kg/m   Physical  Exam Vitals and nursing note reviewed.  Constitutional:      Appearance: She is well-developed.  HENT:     Head: Normocephalic and atraumatic.  Eyes:     Extraocular Movements: Extraocular movements intact.     Pupils: Pupils are equal, round, and reactive to light.  Cardiovascular:     Rate and Rhythm: Normal rate.  Pulmonary:     Effort: Pulmonary effort is normal.  Abdominal:     General: Bowel sounds are normal.  Musculoskeletal:     Cervical back: Normal range of motion and neck supple. No rigidity.  Skin:    General: Skin is warm and dry.  Neurological:     Mental Status: She is alert and oriented to person, place, and time.     GCS: GCS eye subscore is 4. GCS verbal subscore is 5. GCS motor subscore is 6.     Cranial Nerves: No cranial nerve deficit, dysarthria or facial asymmetry.     Sensory: No sensory deficit.     Motor: No weakness.     ED Results / Procedures / Treatments   Labs (all labs ordered are listed, but only abnormal results are displayed) Labs Reviewed  CBG MONITORING, ED - Abnormal; Notable for the following components:      Result Value   Glucose-Capillary 131 (*)    All other components within normal limits    EKG None  Radiology No results found.  Procedures Procedures (including critical care time)  Medications Ordered in ED Medications  ketorolac (TORADOL) 30 MG/ML injection 30 mg (30 mg Intramuscular Given 12/20/19 0404)  acetaminophen (TYLENOL) tablet 1,000 mg (1,000 mg Oral Given 12/20/19 0404)  promethazine (PHENERGAN) tablet 25 mg (25 mg Oral Given 12/20/19 0404)  naproxen (NAPROSYN) tablet 375 mg (375 mg Oral Given 12/20/19 0555)  ondansetron (ZOFRAN-ODT) disintegrating tablet 8 mg (8 mg Oral Given 12/20/19 0555)    ED Course  I have reviewed the triage vital signs and the nursing notes.  Pertinent labs & imaging results that were available during my care of the patient were reviewed by me and considered in my medical decision  making (see chart for details).    MDM Rules/Calculators/A&P  59 year old female comes in a chief complaint of headache.  She has history of migraines and reports that since her diagnosis of Covid in November she has been having frequent episodes of headache.  She has seen neurologist and come to the ER for it.  She has had CT scans of the brain that have been negative.  Her headaches typically last for few hours and then resolved.  Headaches are consistent with her migraine headaches as they are throbbing and there is associated nausea, photophobia, phonophobia.  She denies any history of clotting.  Patient is taking Advil at home.  There is no meningismus on exam.  We do not think there is underlying infectious process.  I did consider cerebral venous thrombosis in the diagnosis given that she had COVID-19 few months back, however given the headaches and intermittent, it is unlikely to be the cause for the headaches.  Patient also complains of some urinary discomfort.  She is on Bactrim.  Last 1 she was given fosfomycin in the ER.  She is allergic to penicillin.  Clinically, our suspicion is low for UTI.  Her UAs in the ED have been negative.  She denies any fevers, chills, flank pain and her abdominal exam is benign.  Reassessment: Reports improved headache with IM Toradol.  Headaches have not completely resolved, but they are tolerable.  She is amenable with conservative management with oral medications and following up with PCP or neurologist. Strict ER return precautions have been discussed, and patient is agreeing with the plan and is comfortable with the workup done and the recommendations from the ER.   Final Clinical Impression(s) / ED Diagnoses Final diagnoses:  Migraine without aura and with status migrainosus, not intractable    Rx / DC Orders ED Discharge Orders         Ordered    ondansetron (ZOFRAN ODT) 8 MG disintegrating tablet  Every 8 hours PRN      12/20/19 0600           Varney Biles, MD 12/20/19 [phone]

## 2019-12-20 NOTE — Discharge Instructions (Addendum)
We saw you in the ER for headaches. We suspect that you are having migraines.  Continue taking ibuprofen and Tylenol for pain control.  Recommend that you follow-up with neurologist for optimal control of your headaches.  Please return to the ER if the headache gets severe and in not improving, you have associated new one sided numbness, tingling, weakness or confusion, seizures, poor balance or poor vision.

## 2020-01-02 ENCOUNTER — Other Ambulatory Visit: Payer: Self-pay

## 2020-01-02 ENCOUNTER — Ambulatory Visit: Payer: Medicaid Other | Attending: Nurse Practitioner

## 2020-01-02 DIAGNOSIS — R262 Difficulty in walking, not elsewhere classified: Secondary | ICD-10-CM

## 2020-01-02 DIAGNOSIS — R293 Abnormal posture: Secondary | ICD-10-CM | POA: Diagnosis present

## 2020-01-02 DIAGNOSIS — M545 Low back pain, unspecified: Secondary | ICD-10-CM

## 2020-01-02 DIAGNOSIS — G8929 Other chronic pain: Secondary | ICD-10-CM | POA: Insufficient documentation

## 2020-01-02 DIAGNOSIS — M6281 Muscle weakness (generalized): Secondary | ICD-10-CM | POA: Diagnosis present
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
## 2020-01-10 ENCOUNTER — Encounter: Payer: Medicaid Other | Admitting: Physical Therapy

## 2020-01-20 ENCOUNTER — Other Ambulatory Visit (HOSPITAL_COMMUNITY)
Admission: RE | Admit: 2020-01-20 | Discharge: 2020-01-20 | Disposition: A | Discharge status: Home / Self Care | Payer: Medicaid Other | Source: Ambulatory Visit | Attending: Pulmonary Disease | Admitting: Pulmonary Disease

## 2020-01-20 DIAGNOSIS — Z01812 Encounter for preprocedural laboratory examination: Secondary | ICD-10-CM | POA: Insufficient documentation

## 2020-01-20 DIAGNOSIS — Z20822 Contact with and (suspected) exposure to covid-19: Secondary | ICD-10-CM | POA: Diagnosis not present

## 2020-01-20 LAB — SARS CORONAVIRUS 2 (TAT 6-24 HRS): SARS Coronavirus 2: NEGATIVE

## 2020-02-10 ENCOUNTER — Emergency Department (HOSPITAL_BASED_OUTPATIENT_CLINIC_OR_DEPARTMENT_OTHER)
Admission: EM | Admit: 2020-02-10 | Discharge: 2020-02-10 | Disposition: A | Discharge status: Home / Self Care | Payer: Medicaid Other | Attending: Emergency Medicine | Admitting: Emergency Medicine

## 2020-02-10 ENCOUNTER — Encounter (HOSPITAL_BASED_OUTPATIENT_CLINIC_OR_DEPARTMENT_OTHER): Payer: Self-pay | Admitting: Emergency Medicine

## 2020-02-10 ENCOUNTER — Other Ambulatory Visit: Payer: Self-pay

## 2020-02-10 DIAGNOSIS — R519 Headache, unspecified: Secondary | ICD-10-CM | POA: Diagnosis present

## 2020-02-10 DIAGNOSIS — E7800 Pure hypercholesterolemia, unspecified: Secondary | ICD-10-CM | POA: Insufficient documentation

## 2020-02-10 DIAGNOSIS — E739 Lactose intolerance, unspecified: Secondary | ICD-10-CM | POA: Diagnosis not present

## 2020-02-10 DIAGNOSIS — E119 Type 2 diabetes mellitus without complications: Secondary | ICD-10-CM | POA: Diagnosis not present

## 2020-02-10 DIAGNOSIS — Z7984 Long term (current) use of oral hypoglycemic drugs: Secondary | ICD-10-CM | POA: Diagnosis not present

## 2020-02-10 DIAGNOSIS — Z888 Allergy status to other drugs, medicaments and biological substances status: Secondary | ICD-10-CM | POA: Diagnosis not present

## 2020-02-10 DIAGNOSIS — I10 Essential (primary) hypertension: Secondary | ICD-10-CM | POA: Insufficient documentation

## 2020-02-10 DIAGNOSIS — R112 Nausea with vomiting, unspecified: Secondary | ICD-10-CM | POA: Diagnosis not present

## 2020-02-10 DIAGNOSIS — J449 Chronic obstructive pulmonary disease, unspecified: Secondary | ICD-10-CM | POA: Insufficient documentation

## 2020-02-10 DIAGNOSIS — Z880 Allergy status to penicillin: Secondary | ICD-10-CM | POA: Insufficient documentation

## 2020-02-10 DIAGNOSIS — Z79899 Other long term (current) drug therapy: Secondary | ICD-10-CM | POA: Insufficient documentation

## 2020-02-10 LAB — CBG MONITORING, ED: Glucose-Capillary: 143 mg/dL — ABNORMAL HIGH (ref 70–99)

## 2020-02-10 MED ORDER — PROCHLORPERAZINE EDISYLATE 10 MG/2ML IJ SOLN
10.0000 mg | Freq: Once | INTRAMUSCULAR | Status: AC
  Administered 2020-02-10: 10 mg via INTRAVENOUS
  Filled 2020-02-10: qty 2

## 2020-02-10 MED ORDER — DIPHENHYDRAMINE HCL 50 MG/ML IJ SOLN
25.0000 mg | Freq: Once | INTRAMUSCULAR | Status: AC
  Administered 2020-02-10: 25 mg via INTRAVENOUS
  Filled 2020-02-10: qty 1

## 2020-02-10 MED ORDER — DEXAMETHASONE SODIUM PHOSPHATE 10 MG/ML IJ SOLN: 10.0000 mg | Freq: Once | INTRAMUSCULAR | Status: DC

## 2020-02-10 MED ORDER — KETOROLAC TROMETHAMINE 15 MG/ML IJ SOLN
15.0000 mg | Freq: Once | INTRAMUSCULAR | Status: AC
  Administered 2020-02-10: 15 mg via INTRAVENOUS
  Filled 2020-02-10: qty 1

## 2020-02-10 MED ORDER — ONDANSETRON 4 MG PO TBDP: 4.0000 mg | ORAL_TABLET | Freq: Three times a day (TID) | ORAL | 0 refills | Status: AC | PRN

## 2020-02-10 NOTE — ED Triage Notes (Signed)
  Patient comes in with a migraine that started around 2230 last night.  Patient states she has a hx of migraines but has them infrequently.  Patient states she has had 3 episodes of emesis since then with last episode about 15 minutes ago.  Hx of spinal stenosis, sciatica, and type 2 diabetes.  Patient took oxycodone at home for pain but did not help.  Pain 10/10, sharp on top of head.  No photosensitivity.

## 2020-02-10 NOTE — ED Provider Notes (Signed)
[facility] EMERGENCY DEPARTMENT Provider Note  CSN: [id number] Arrival date & time: 02/10/20 5093  Chief Complaint(s) Migraine and Emesis  HPI Susan Hogan is a 59 y.o. female   HPI CC: headache  Onset/Duration: 2 hrs Timing: constant Location: calvarial Quality: aching/sharp Severity: moderate/severe Modifying Factors:  Improved by: nothing  Worsened by: emesis and applying pressure to head Associated Signs/Symptoms:  Pertinent (+): NBNB emesis (x3)  Pertinent (-): fever, chills, abd pain, chest pain, SOB, focal deficits Context: Reports eating suspicious food intake from [facility] earlier this evening.  Past Medical History Past Medical History:  Diagnosis Date  . Anemia   . Arthritis    RA "states does not see Rheumatologist"  . Asthma   . Carpal tunnel syndrome    bilaterally  . Complication of anesthesia    difficulty breathing  . COPD (chronic obstructive pulmonary disease) ([place])   . COVID-19   . Degenerative disk disease    back  . Depression   . Diabetes mellitus   . Fibromyalgia   . GERD (gastroesophageal reflux disease)   . Headache(784.0)    migraines  . Heart murmur   . Hepatitis    In followup treatment for hepatitis C  . Hypertension    no medication for at this time, sees Dr. Dinah Beers Pcp  . Hypotensive episode    hx of  . Irritable bowel syndrome   . Kidney stone    with hematuria  . Memory loss   . Recurrent upper respiratory infection (URI)   . Shortness of breath   . Syncope    Patient Active Problem List   Diagnosis Date Noted  . Chronic migraine without aura, with intractable migraine, so stated, with status migrainosus 03/01/2019  . Medication overuse headache 03/01/2019  . Seasonal allergic rhinitis due to pollen 03/25/2018  . Adnexal cyst 03/25/2018  . Morbid obesity ([place]) 03/25/2018  . Essential hypertension 03/25/2018  . Pure hypercholesterolemia 03/25/2018  . Sleep-disordered breathing 03/25/2018    . Vitamin D deficiency 03/25/2018  . Bilateral lower extremity edema 03/25/2018  . Acute meniscal tear of left knee 11/24/2016  . Chronic hepatitis C without hepatic coma ([place]) 11/24/2016  . Fatty liver 11/24/2016  . H/O degenerative disc disease 11/24/2016  . COPD (chronic obstructive pulmonary disease) ([place]) 10/22/2016  . Type 2 diabetes mellitus, cannot tolerate Metformin 07/25/2014  . Fibromyalgia 07/25/2014  . MDD (major depressive disorder), recurrent severe, without psychosis ([place]) 03/17/2013  . Hepatitis C 08/02/2007  . IBS (irritable bowel syndrome) 08/02/2007  . Mild persistent asthma with acute exacerbation 09/21/1962   Home Medication(s) Prior to Admission medications   Medication Sig Start Date End Date Taking? Authorizing Provider  acetic acid 2 % otic solution Place 4 drops into the left ear 4 (four) times daily. 10/30/19   McDonald, Mia A, PA-C  albuterol (VENTOLIN HFA) 108 (90 Base) MCG/ACT inhaler Inhale 1-2 puffs into the lungs every 6 (six) hours as needed for wheezing or shortness of breath. 11/14/19   Olalere, Cicero Duck A, MD  amLODipine (NORVASC) 10 MG tablet Take 1 tablet (10 mg total) by mouth daily. Patient not taking: Reported on 01/02/2020 10/10/19 01/08/20  Geralynn Rile, MD  blood glucose meter kit and supplies KIT Dispense based on patient and insurance preference. Use up to four times daily as directed. (FOR ICD-9 250.00, 250.01). 03/25/18   Briscoe Deutscher, DO  glucose blood (ACCU-CHEK ACTIVE STRIPS) test strip Use as instructed 04/15/16   Nona Dell, PA-C  lidocaine (LIDODERM) 5 % Place 1 patch onto the skin daily. Remove & Discard patch within 12 hours or as directed by MD 11/08/19   Randal Buba, April, MD  losartan (COZAAR) 50 MG tablet Take 50 mg by mouth daily. 10/11/19   [provider]  methocarbamol (ROBAXIN) 500 MG tablet Take 1 tablet (500 mg total) by mouth every 8 (eight) hours as needed for muscle spasms (back pain). 10/14/19    Mesner, Corene Cornea, MD  ondansetron (ZOFRAN ODT) 4 MG disintegrating tablet Take 1 tablet (4 mg total) by mouth every 8 (eight) hours as needed for up to 3 days for nausea or vomiting. 02/10/20 02/13/20  Christana Angelica, Grayce Sessions, MD  potassium chloride SA (KLOR-CON) 20 MEQ tablet Take 20 mEq by mouth daily. 10/11/19   [provider]  umeclidinium-vilanterol (ANORO ELLIPTA) 62.5-25 MCG/INH AEPB Inhale 1 puff into the lungs daily. 11/14/19   Laurin Coder, MD  cetirizine (ZYRTEC ALLERGY) 10 MG tablet Take 1 tablet (10 mg total) by mouth daily. 09/24/19 10/10/19  Petrucelli, Glynda Jaeger, PA-C  dicyclomine (BENTYL) 20 MG tablet Take 1 tablet (20 mg total) by mouth 2 (two) times daily. Patient not taking: Reported on 10/01/2019 08/19/19 10/01/19  Lorayne Bender, PA-C  diphenhydrAMINE (BENADRYL) 25 MG tablet Take 1 tablet (25 mg total) by mouth every 6 (six) hours as needed. Patient not taking: Reported on 10/01/2019 09/24/19 10/01/19  Petrucelli, Aldona Bar R, PA-C  escitalopram (LEXAPRO) 10 MG tablet TAKE 1 TABLET(10 MG) BY MOUTH DAILY Patient not taking: Reported on 07/05/2018 06/01/18 09/24/19  Briscoe Deutscher, DO  labetalol (NORMODYNE) 100 MG tablet Take 1 tablet (100 mg total) by mouth 2 (two) times daily. Patient not taking: Reported on 10/01/2019 08/30/19 10/01/19  Milton Ferguson, MD  prochlorperazine (COMPAZINE) 10 MG tablet Take 1 tablet (10 mg total) by mouth every 6 (six) hours as needed. May take with '25mg'$  of benadryl for migraines.. Patient not taking: Reported on 10/01/2019 03/09/19 10/01/19  Melvenia Beam, MD  rizatriptan (MAXALT-MLT) 10 MG disintegrating tablet Take 1 tablet (10 mg total) by mouth as needed for migraine. May repeat in 2 hours if needed. Maximum 2x in one day. Patient not taking: Reported on 10/14/2019 03/16/19 10/14/19  Melvenia Beam, MD  topiramate (TOPAMAX) 100 MG tablet Take 1 tablet (100 mg total) by mouth at bedtime. Patient not taking: Reported on 10/01/2019 03/09/19 10/01/19  Melvenia Beam, MD                                                                                                                                    Past Surgical History Past Surgical History:  Procedure Laterality Date  . CHOLECYSTECTOMY  2019  . DILATION AND CURETTAGE OF UTERUS    . Left knee surgery    . LIVER BIOPSY    . TONSILLECTOMY    . TUBAL LIGATION     Family History Family History  Problem Relation Age of Onset  . Heart disease Mother   . Diabetes Mother   . Asthma Mother   . Anemia Mother   . Cerebral aneurysm Mother        ruptured  . Kidney failure Mother   . Migraines Mother   . Parkinson's disease Mother   . Colon cancer Maternal Uncle   . Heart disease Maternal Uncle   . Heart disease Maternal Grandmother   . Diabetes Maternal Grandmother   . Asthma Maternal Grandmother   . Heart Problems Maternal Grandmother   . Heart Problems Other        through family  . Diabetes Granddaughter   . Diabetes Daughter     Social History Social History   Tobacco Use  . Smoking status: Never Smoker  . Smokeless tobacco: Never Used  Substance Use Topics  . Alcohol use: Never    Alcohol/week: 0.0 standard drinks  . Drug use: Never   Allergies Anesthetic ether [ether], Nubain [nalbuphine hcl], Penicillins, Lisinopril, Advair diskus [fluticasone-salmeterol], and Lactose intolerance (gi)  Review of Systems Review of Systems All other systems are reviewed and are negative for acute change except as noted in the HPI  Physical Exam Vital Signs  I have reviewed the triage vital signs BP (!) 166/91 (BP Location: Right Arm)   Pulse 79   Temp 98.2 F (36.8 C) (Oral)   Resp 18   Ht '4\' 11"'$  (1.499 m)   Wt 98 kg   LMP  (LMP Unknown)   SpO2 98%   BMI 43.64 kg/m   Physical Exam Vitals reviewed.  Constitutional:      General: She is not in acute distress.    Appearance: She is well-developed. She is not diaphoretic.  HENT:     Head: Normocephalic and atraumatic.      Right Ear: External ear normal.     Left Ear: External ear normal.     Nose: Nose normal.  Eyes:     General: No scleral icterus.    Conjunctiva/sclera: Conjunctivae normal.  Neck:     Trachea: Phonation normal.  Cardiovascular:     Rate and Rhythm: Normal rate and regular rhythm.  Pulmonary:     Effort: Pulmonary effort is normal. No respiratory distress.     Breath sounds: No stridor.  Abdominal:     General: There is no distension.     Tenderness: There is no abdominal tenderness.  Musculoskeletal:        General: Normal range of motion.     Cervical back: Normal range of motion.  Neurological:     Mental Status: She is alert and oriented to person, place, and time.     Cranial Nerves: Cranial nerves are intact.     Sensory: Sensation is intact.     Motor: Motor function is intact.  Psychiatric:        Behavior: Behavior normal.     ED Results and Treatments Labs (all labs ordered are listed, but only abnormal results are displayed) Labs Reviewed  CBG MONITORING, ED - Abnormal; Notable for the following components:      Result Value   Glucose-Capillary 143 (*)    All other components within normal limits  EKG  EKG Interpretation  Date/Time:    Ventricular Rate:    PR Interval:    QRS Duration:   QT Interval:    QTC Calculation:   R Axis:     Text Interpretation:        Radiology No results found.  Pertinent labs & imaging results that were available during my care of the patient were reviewed by me and considered in my medical decision making (see chart for details).  Medications Ordered in ED Medications  diphenhydrAMINE (BENADRYL) injection 25 mg (25 mg Intravenous Given 02/10/20 0511)  prochlorperazine (COMPAZINE) injection 10 mg (10 mg Intravenous Given 02/10/20 0511)  ketorolac (TORADOL) 15 MG/ML injection 15 mg (15 mg Intravenous  Given 02/10/20 0511)                                                                                                                                    Procedures Procedures  (including critical care time)  Medical Decision Making / ED Course I have reviewed the nursing notes for this encounter and the patient's prior records (if available in EHR or on provided paperwork).   Susan Hogan was evaluated in Emergency Department on 02/10/2020 for the symptoms described in the history of present illness. She was evaluated in the context of the global COVID-19 pandemic, which necessitated consideration that the patient might be at risk for infection with the SARS-CoV-2 virus that causes COVID-19. Institutional protocols and algorithms that pertain to the evaluation of patients at risk for COVID-19 are in a state of rapid change based on information released by regulatory bodies including the CDC and federal and state organizations. These policies and algorithms were followed during the patient's care in the ED.  migraine headache related to emesis, likely from suspicious food intake. Non focal neuro exam. No recent head trauma. No fever. Doubt meningitis. Doubt intracranial bleed. Doubt IIH. No indication for imaging.   Abdomen benign. CBG reassuring.  Doubt serious intra-abdominal (infectious process or bowel obstruction.  Will treat with migraine cocktail and reevaluate.      Patient has significant improvement following migraine cocktail.  She is able to tolerate oral intake.  Final Clinical Impression(s) / ED Diagnoses Final diagnoses:  Bad headache  Nausea and vomiting in adult    The patient appears reasonably screened and/or stabilized for discharge and I doubt any other medical condition or other [facility] requiring further screening, evaluation, or treatment in the ED at this time prior to discharge. Safe for discharge with strict return precautions.  Disposition:  Discharge  Condition: Good  I have discussed the results, Dx and Tx plan with the patient/family who expressed understanding and agree(s) with the plan. Discharge instructions discussed at length. The patient/family was given strict return precautions who verbalized understanding of the instructions. No further questions at time of discharge.    ED Discharge Orders         Ordered  ondansetron (ZOFRAN ODT) 4 MG disintegrating tablet  Every 8 hours PRN     02/10/20 0550            Follow Up: Nicholes Rough, PA-C [address] [place] [address] [phone]  Schedule an appointment as soon as possible for a visit  As needed     This chart was dictated using voice recognition software.  Despite best efforts to proofread,  errors can occur which can change the documentation meaning.   Fatima Blank, MD 02/10/20 [phone]

## 2020-02-10 NOTE — ED Notes (Signed)
Checked CBG 143, RN Greig Castilla informed

## 2020-02-19 ENCOUNTER — Observation Stay (HOSPITAL_COMMUNITY)
Admission: EM | Admit: 2020-02-19 | Discharge: 2020-02-20 | Disposition: A | Discharge status: Home / Self Care | Payer: Medicaid Other | Attending: Family Medicine | Admitting: Family Medicine

## 2020-02-19 ENCOUNTER — Observation Stay (HOSPITAL_COMMUNITY): Payer: Medicaid Other

## 2020-02-19 ENCOUNTER — Other Ambulatory Visit: Payer: Self-pay

## 2020-02-19 ENCOUNTER — Encounter (HOSPITAL_COMMUNITY): Payer: Self-pay

## 2020-02-19 ENCOUNTER — Emergency Department (HOSPITAL_COMMUNITY): Payer: Medicaid Other

## 2020-02-19 DIAGNOSIS — R001 Bradycardia, unspecified: Secondary | ICD-10-CM | POA: Diagnosis not present

## 2020-02-19 DIAGNOSIS — E119 Type 2 diabetes mellitus without complications: Secondary | ICD-10-CM

## 2020-02-19 DIAGNOSIS — G459 Transient cerebral ischemic attack, unspecified: Principal | ICD-10-CM | POA: Diagnosis present

## 2020-02-19 DIAGNOSIS — Z20822 Contact with and (suspected) exposure to covid-19: Secondary | ICD-10-CM | POA: Diagnosis not present

## 2020-02-19 DIAGNOSIS — G43909 Migraine, unspecified, not intractable, without status migrainosus: Secondary | ICD-10-CM | POA: Diagnosis not present

## 2020-02-19 DIAGNOSIS — G5603 Carpal tunnel syndrome, bilateral upper limbs: Secondary | ICD-10-CM | POA: Insufficient documentation

## 2020-02-19 DIAGNOSIS — I10 Essential (primary) hypertension: Secondary | ICD-10-CM | POA: Diagnosis not present

## 2020-02-19 DIAGNOSIS — N390 Urinary tract infection, site not specified: Secondary | ICD-10-CM | POA: Diagnosis present

## 2020-02-19 DIAGNOSIS — B1920 Unspecified viral hepatitis C without hepatic coma: Secondary | ICD-10-CM | POA: Insufficient documentation

## 2020-02-19 DIAGNOSIS — Z79899 Other long term (current) drug therapy: Secondary | ICD-10-CM | POA: Diagnosis not present

## 2020-02-19 DIAGNOSIS — J45909 Unspecified asthma, uncomplicated: Secondary | ICD-10-CM | POA: Insufficient documentation

## 2020-02-19 DIAGNOSIS — E1151 Type 2 diabetes mellitus with diabetic peripheral angiopathy without gangrene: Secondary | ICD-10-CM | POA: Insufficient documentation

## 2020-02-19 DIAGNOSIS — M069 Rheumatoid arthritis, unspecified: Secondary | ICD-10-CM | POA: Diagnosis not present

## 2020-02-19 DIAGNOSIS — Z7952 Long term (current) use of systemic steroids: Secondary | ICD-10-CM | POA: Insufficient documentation

## 2020-02-19 DIAGNOSIS — J449 Chronic obstructive pulmonary disease, unspecified: Secondary | ICD-10-CM | POA: Diagnosis not present

## 2020-02-19 DIAGNOSIS — K589 Irritable bowel syndrome without diarrhea: Secondary | ICD-10-CM | POA: Diagnosis not present

## 2020-02-19 DIAGNOSIS — M797 Fibromyalgia: Secondary | ICD-10-CM | POA: Diagnosis not present

## 2020-02-19 DIAGNOSIS — K219 Gastro-esophageal reflux disease without esophagitis: Secondary | ICD-10-CM | POA: Insufficient documentation

## 2020-02-19 DIAGNOSIS — F329 Major depressive disorder, single episode, unspecified: Secondary | ICD-10-CM | POA: Insufficient documentation

## 2020-02-19 LAB — TROPONIN I (HIGH SENSITIVITY)
Troponin I (High Sensitivity): <2 ng/L (ref <18)
Troponin I (High Sensitivity): <2 ng/L (ref <18)

## 2020-02-19 LAB — BASIC METABOLIC PANEL
Anion gap: 9 (ref 5–15)
BUN: 20 mg/dL (ref 8–23)
CO2: 25 mmol/L (ref 22–32)
Calcium: 9.5 mg/dL (ref 8.9–10.3)
Chloride: 107 mmol/L (ref 98–111)
Creatinine, Ser: 0.8 mg/dL (ref 0.44–1.00)
GFR calc Af Amer: >60 mL/min (ref >60)
GFR calc non Af Amer: >60 mL/min (ref >60)
Glucose, Bld: 114 mg/dL — ABNORMAL HIGH (ref 70–99)
Potassium: 4.4 mmol/L (ref 3.5–5.1)
Sodium: 141 mmol/L (ref 135–145)

## 2020-02-19 LAB — CBC WITH DIFFERENTIAL/PLATELET
Abs Immature Granulocytes: 0.08 10*3/uL — ABNORMAL HIGH (ref 0.00–0.07)
Basophils Absolute: 0 10*3/uL (ref 0.0–0.1)
Basophils Relative: 0 %
Eosinophils Absolute: 0.1 10*3/uL (ref 0.0–0.5)
Eosinophils Relative: 1 %
HCT: 43.7 % (ref 36.0–46.0)
Hemoglobin: 14.7 g/dL (ref 12.0–15.0)
Immature Granulocytes: 1 %
Lymphocytes Relative: 25 %
Lymphs Abs: 2.7 10*3/uL (ref 0.7–4.0)
MCH: 33.1 pg (ref 26.0–34.0)
MCHC: 33.6 g/dL (ref 30.0–36.0)
MCV: 98.4 fL (ref 80.0–100.0)
Monocytes Absolute: 0.8 10*3/uL (ref 0.1–1.0)
Monocytes Relative: 8 %
Neutro Abs: 7 10*3/uL (ref 1.7–7.7)
Neutrophils Relative %: 65 %
Platelets: 206 10*3/uL (ref 150–400)
RBC: 4.44 MIL/uL (ref 3.87–5.11)
RDW: 12.5 % (ref 11.5–15.5)
WBC: 10.8 10*3/uL — ABNORMAL HIGH (ref 4.0–10.5)
nRBC: 0 % (ref 0.0–0.2)

## 2020-02-19 LAB — CBG MONITORING, ED
Glucose-Capillary: 102 mg/dL — ABNORMAL HIGH (ref 70–99)
Glucose-Capillary: 183 mg/dL — ABNORMAL HIGH (ref 70–99)

## 2020-02-19 MED ORDER — ACETAMINOPHEN 160 MG/5ML PO SOLN: 650.0000 mg | ORAL | Status: DC | PRN

## 2020-02-19 MED ORDER — IOHEXOL 350 MG/ML SOLN
75.0000 mL | Freq: Once | INTRAVENOUS | Status: AC | PRN
  Administered 2020-02-19: 75 mL via INTRAVENOUS

## 2020-02-19 MED ORDER — ENOXAPARIN SODIUM 60 MG/0.6ML ~~LOC~~ SOLN
0.5000 mg/kg | SUBCUTANEOUS | Status: DC
  Administered 2020-02-19: 55 mg via SUBCUTANEOUS
  Filled 2020-02-19: qty 0.55

## 2020-02-19 MED ORDER — ONDANSETRON HCL 4 MG/2ML IJ SOLN
4.0000 mg | Freq: Once | INTRAMUSCULAR | Status: AC
  Administered 2020-02-19: 4 mg via INTRAVENOUS
  Filled 2020-02-19: qty 2

## 2020-02-19 MED ORDER — INSULIN ASPART 100 UNIT/ML ~~LOC~~ SOLN
0.0000 [IU] | Freq: Three times a day (TID) | SUBCUTANEOUS | Status: DC
  Administered 2020-02-20: 1 [IU] via SUBCUTANEOUS

## 2020-02-19 MED ORDER — ATORVASTATIN CALCIUM 80 MG PO TABS
80.0000 mg | ORAL_TABLET | Freq: Every day | ORAL | Status: DC
  Administered 2020-02-19 – 2020-02-20 (×2): 80 mg via ORAL
  Filled 2020-02-19 (×3): qty 1

## 2020-02-19 MED ORDER — ACETAMINOPHEN 650 MG RE SUPP: 650.0000 mg | RECTAL | Status: DC | PRN

## 2020-02-19 MED ORDER — ENOXAPARIN SODIUM 40 MG/0.4ML ~~LOC~~ SOLN: 40.0000 mg | SUBCUTANEOUS | Status: DC

## 2020-02-19 MED ORDER — STROKE: EARLY STAGES OF RECOVERY BOOK
Freq: Once | Status: AC
  Filled 2020-02-19: qty 1

## 2020-02-19 MED ORDER — ACETAMINOPHEN 325 MG PO TABS
650.0000 mg | ORAL_TABLET | ORAL | Status: DC | PRN
  Administered 2020-02-19 – 2020-02-20 (×2): 650 mg via ORAL
  Filled 2020-02-19 (×2): qty 2

## 2020-02-19 MED ORDER — ASPIRIN 325 MG PO TABS
325.0000 mg | ORAL_TABLET | Freq: Every day | ORAL | Status: DC
  Administered 2020-02-19 – 2020-02-20 (×2): 325 mg via ORAL
  Filled 2020-02-19 (×3): qty 1

## 2020-02-19 MED ORDER — ASPIRIN 300 MG RE SUPP: 300.0000 mg | Freq: Every day | RECTAL | Status: DC

## 2020-02-19 NOTE — Consult Note (Signed)
Neurology Consultation  Reason for Consult: Numbness, strokelike symptoms: Concern for TIA Referring Physician: Dr. Inocente Salles, ER resident physician  CC: Right-sided numbness, facial droop, weakness  History is obtained from: Patient, chart review  HPI: Susan Hogan is a 59 y.o. female who has a past medical history of chronic back pain, chronic migraines, depression, diabetes, hypertension, COVID-19 a few months ago fully recovered, COPD, fibromyalgia, arthritis, presenting to the emergency room for evaluation of 15 to 20 minutes worth of right-sided numbness and weakness. Her last normal was when she went to bed last night and this morning, she woke up with numbness in the right face arm and leg along with weakness of the right arm and leg.  She has been recently prescribed tizanidine for leg pain and stiffness from one of her physicians at [facility] system, which she took yesterday because her legs are bothering her.  She describes her leg problems as toes being numb and stiffness in her legs. Regarding her strokelike symptoms today, she reported right-sided face arm and leg numbness as well as weakness that lasted about 15 to 20 minutes.  She has not had the symptoms with her chronic headaches and migraines-and did not have a headache before or after the symptoms today. Due to the new nature of the symptoms and having never had these symptoms before, she got concerned and presented to the emergency room, where she was evaluated-CT head was done that was unremarkable, basic lab work was also drawn and based on her description of events/symptoms, she was admitted to the hospitalist for a possible TIA work-up. The patient has had multiple no-shows at the outpatient neurology clinic and has been dismissed from one of the outpatient neurology practices for multiple no-shows but she reports having had to take care of a lot of family members, for which reason she had to miss a lot of those  appointments.  Reports fluid in her ear, which was persistent after her Covid infection.  Had multiple hospital visits before somebody give her eardrops, to which she reacted badly and those eardrops were changed to another eardrops by her primary care.  Continues to report fullness and pain in her left ear.   LKW: Sometime the night of 02/18/2020-9 PM on 10 PM. tpa given?: no, outside the window Premorbid modified Rankin scale (mRS): 0 ROS: Performed and negative except as noted in HPI.  Past Medical History:  Diagnosis Date  . Anemia   . Arthritis    RA "states does not see Rheumatologist"  . Asthma   . Carpal tunnel syndrome    bilaterally  . Complication of anesthesia    difficulty breathing  . COPD (chronic obstructive pulmonary disease) ([place])   . COVID-19   . Degenerative disk disease    back  . Depression   . Diabetes mellitus   . Fibromyalgia   . GERD (gastroesophageal reflux disease)   . Headache(784.0)    migraines  . Heart murmur   . Hepatitis    In followup treatment for hepatitis C  . Hypertension    no medication for at this time, sees Dr. Dinah Beers Pcp  . Hypotensive episode    hx of  . Irritable bowel syndrome   . Kidney stone    with hematuria  . Memory loss   . Recurrent upper respiratory infection (URI)   . Shortness of breath   . Syncope     Family History  Problem Relation Age of Onset  .  Heart disease Mother   . Diabetes Mother   . Asthma Mother   . Anemia Mother   . Cerebral aneurysm Mother        ruptured  . Kidney failure Mother   . Migraines Mother   . Parkinson's disease Mother   . Colon cancer Maternal Uncle   . Heart disease Maternal Uncle   . Heart disease Maternal Grandmother   . Diabetes Maternal Grandmother   . Asthma Maternal Grandmother   . Heart Problems Maternal Grandmother   . Heart Problems Other        through family  . Diabetes Granddaughter   . Diabetes Daughter     Social History:   reports that she  has never smoked. She has never used smokeless tobacco. She reports that she does not drink alcohol or use drugs.  Denies tobacco alcohol or illicit drug use.  Medications  Current Facility-Administered Medications:  .   stroke: mapping our early stages of recovery book, , Does not apply, Once, Shela Leff, MD .  acetaminophen (TYLENOL) tablet 650 mg, 650 mg, Oral, Q4H PRN **OR** acetaminophen (TYLENOL) 160 MG/5ML solution 650 mg, 650 mg, Per Tube, Q4H PRN **OR** acetaminophen (TYLENOL) suppository 650 mg, 650 mg, Rectal, Q4H PRN, Shela Leff, MD .  aspirin suppository 300 mg, 300 mg, Rectal, Daily **OR** aspirin tablet 325 mg, 325 mg, Oral, Daily, Shela Leff, MD .  atorvastatin (LIPITOR) tablet 80 mg, 80 mg, Oral, Daily, Shela Leff, MD .  enoxaparin (LOVENOX) injection 40 mg, 40 mg, Subcutaneous, Q24H, Shela Leff, MD .  Derrill Memo ON 02/20/2020] insulin aspart (novoLOG) injection 0-9 Units, 0-9 Units, Subcutaneous, TID WC, Shela Leff, MD  Current Outpatient Medications:  .  acetic acid 2 % otic solution, Place 4 drops into the left ear 4 (four) times daily., Disp: 15 mL, Rfl: 0 .  albuterol (VENTOLIN HFA) 108 (90 Base) MCG/ACT inhaler, Inhale 1-2 puffs into the lungs every 6 (six) hours as needed for wheezing or shortness of breath., Disp: 18 g, Rfl: 5 .  amLODipine (NORVASC) 10 MG tablet, Take 1 tablet (10 mg total) by mouth daily. (Patient not taking: Reported on 01/02/2020), Disp: 90 tablet, Rfl: 3 .  blood glucose meter kit and supplies KIT, Dispense based on patient and insurance preference. Use up to four times daily as directed. (FOR ICD-9 250.00, 250.01)., Disp: 1 each, Rfl: 0 .  glucose blood (ACCU-CHEK ACTIVE STRIPS) test strip, Use as instructed, Disp: 100 each, Rfl: 12 .  lidocaine (LIDODERM) 5 %, Place 1 patch onto the skin daily. Remove & Discard patch within 12 hours or as directed by MD, Disp: 15 patch, Rfl: 0 .  losartan (COZAAR) 50 MG  tablet, Take 50 mg by mouth daily., Disp: , Rfl:  .  methocarbamol (ROBAXIN) 500 MG tablet, Take 1 tablet (500 mg total) by mouth every 8 (eight) hours as needed for muscle spasms (back pain)., Disp: 20 tablet, Rfl: 0 .  potassium chloride SA (KLOR-CON) 20 MEQ tablet, Take 20 mEq by mouth daily., Disp: , Rfl:  .  umeclidinium-vilanterol (ANORO ELLIPTA) 62.5-25 MCG/INH AEPB, Inhale 1 puff into the lungs daily., Disp: 60 each, Rfl: 0   Exam: Current vital signs: BP 107/61   Pulse (!) 52   Temp 97.9 F (36.6 C) (Oral)   Resp 20   Ht '4\' 11"'$  (1.499 m)   Wt 107 kg   LMP  (LMP Unknown)   SpO2 98%   BMI 47.67 kg/m  Vital signs in  last 24 hours: Temp:  [97.9 F (36.6 C)] 97.9 F (36.6 C) (05/31 1115) Pulse Rate:  [47-58] 52 (05/31 1945) Resp:  [13-22] 20 (05/31 1945) BP: (102-131)/(53-69) 107/61 (05/31 1945) SpO2:  [96 %-100 %] 98 % (05/31 1945) Weight:  [107 kg] 107 kg (05/31 1115) General: Awake alert in no distress HEENT: Normocephalic atraumatic dry oral mucous membranes no thyromegaly Lungs clear to auscultation Cardiovascular: S1-2 heard regular rate rhythm Abdomen nondistended nontender Extremities warm well perfused Neurological exam Awake alert oriented x3 No dysarthria No aphasia Cranial nerves: Pupils equal round react light, extraocular movements intact, visual fields full, facial sensation is diminished on the right side to light touch, but she does not split any sensation on the forehead to tuning fork, face is symmetric, auditory acuity is intact, tongue and palate midline. Motor exam: 5/5 in all fours without any drift. Sensory exam: This part of the exam was a little inconsistent she reported feeling stronger light touch and temperature on the right face, trunk, arm but somewhat stronger sensation on the lower extremity on the left initially but then changed it to consistent symptoms with diminished sensation on the right. Coordination: No dysmetria Gait testing was  deferred at this time  Labs I have reviewed labs in epic and the results pertinent to this consultation are:  CBC    Component Value Date/Time   WBC 10.8 (H) 02/19/2020 1500   RBC 4.44 02/19/2020 1500   HGB 14.7 02/19/2020 1500   HCT 43.7 02/19/2020 1500   PLT 206 02/19/2020 1500   MCV 98.4 02/19/2020 1500   MCH 33.1 02/19/2020 1500   MCHC 33.6 02/19/2020 1500   RDW 12.5 02/19/2020 1500   LYMPHSABS 2.7 02/19/2020 1500   MONOABS 0.8 02/19/2020 1500   EOSABS 0.1 02/19/2020 1500   BASOSABS 0.0 02/19/2020 1500    CMP     Component Value Date/Time   NA 141 02/19/2020 1500   K 4.4 02/19/2020 1500   CL 107 02/19/2020 1500   CO2 25 02/19/2020 1500   GLUCOSE 114 (H) 02/19/2020 1500   BUN 20 02/19/2020 1500   CREATININE 0.80 02/19/2020 1500   CREATININE 0.71 04/08/2018 1536   CALCIUM 9.5 02/19/2020 1500   PROT 6.2 (L) 10/10/2019 1057   ALBUMIN 3.6 10/10/2019 1057   AST 92 (H) 10/10/2019 1057   ALT 101 (H) 10/10/2019 1057   ALKPHOS 68 10/10/2019 1057   BILITOT 0.7 10/10/2019 1057   GFRNONAA >60 02/19/2020 1500   GFRAA >60 02/19/2020 1500   Imaging I have reviewed the images obtained: CT-scan of the brain-no acute changes.  Minimal periventricular small vessel disease.  No bleed.  Assessment:  59 year old with above past medical history presenting for 15 to 20 minutes worth of right-sided numbness and weakness-the weakness is resolved and the facial droop is no longer there but she does report diminished sensation on the right compared to the left. Given her risk factors, it is appropriate to evaluate her for stroke/TIA risk factors work-up. She would need further imaging in the form of MRI of the brain to look for any evidence of a small thalamic/thalamic capsular infarct which might have presented with the transient motor symptoms but has persistent sensory symptoms.  Impression: -Evaluate for stroke/TIA -Has a history of migraines but the description of the event  bringing her to the hospital today did not seem like complex migraine-it should be on the differentials but given the risk factors and age, she should be evaluated for stroke/TIAs as  well.  Recommendations: Admit to hospitalist Telemetry Frequent neurochecks MRI brain without contrast CTA head and neck 2D echo A1c Lipid panel Aspirin 325 Atorvastatin 80 PT OT Speech therapy N.p.o. until cleared by swallow evaluation at bedside or formal swallow evaluation. Stroke team will continue to follow with you. D/W Dr Laverle Patter, ED Resident Physician  -- Amie Portland, MD [facility] Pager: [phone] If 7pm to 7am, please call on call as listed on AMION.

## 2020-02-19 NOTE — ED Notes (Signed)
Although Pt scores a 1 on modified NIH she states that right leg sensation feels different and she states that she  Still feels week in that leg. Pt can stand on that leg but still complains of weaknesss

## 2020-02-19 NOTE — H&P (Signed)
History and Physical    Laisa Carolan NID:[id number] DOB: Aug 05, 1961 DOA: 02/19/2020  PCP: Nicholes Rough, PA-C Patient coming from: Home  Chief Complaint: Lightheadedness, numbness and weakness, palpitations  HPI: Susan Hogan is a 58 y.o. female with medical history significant of arthritis, asthma, COPD, history of COVID-19 infection, depression, diabetes, fibromyalgia, GERD, hypertension presenting to the ED via EMS with complaints of lightheadedness, right-sided numbness/ numbness, and palpitations.  Patient states this morning around 5 AM she took Zanaflex for pain in her legs.  She then went to sleep and woke up around 9 AM and noticed that the right side her face, right arm, and right leg were numb.  She also had weakness in her right leg when she tried to walk.  States her family did not notice any problem with her face when she was talking to them.  She also experienced lightheadedness and heart palpitations at that time.  Symptoms lasted the entire day and mostly resolved since she has been in the ED except she continues to have numbness in her right leg.  Denies prior history of stroke.  She does not smoke cigarettes.  States she had Covid infection in November 2020.  Denies any recent illness, fevers, chills, cough, or shortness of breath.  ED Course: Bradycardic with heart rate in the 40s to 50s.  Remainder of vital signs stable.  Labs showing no significant leukocytosis.  Not hypoglycemic.  No significant electrolyte derangements.  Renal function at baseline.  High-sensitivity troponin checked twice negative.  EKG showing sinus bradycardia.  Screening SARS-CoV-2 PCR test pending.  Head CT negative for acute infarct.  Patient received Zofran.  Admission requested for TIA work-up.  Review of Systems:  All systems reviewed and apart from history of presenting illness, are negative.  Past Medical History:  Diagnosis Date  . Anemia   . Arthritis    RA "states does not see  Rheumatologist"  . Asthma   . Carpal tunnel syndrome    bilaterally  . Complication of anesthesia    difficulty breathing  . COPD (chronic obstructive pulmonary disease) ([place])   . COVID-19   . Degenerative disk disease    back  . Depression   . Diabetes mellitus   . Fibromyalgia   . GERD (gastroesophageal reflux disease)   . Headache(784.0)    migraines  . Heart murmur   . Hepatitis    In followup treatment for hepatitis C  . Hypertension    no medication for at this time, sees Dr. Dinah Beers Pcp  . Hypotensive episode    hx of  . Irritable bowel syndrome   . Kidney stone    with hematuria  . Memory loss   . Recurrent upper respiratory infection (URI)   . Shortness of breath   . Syncope     Past Surgical History:  Procedure Laterality Date  . CHOLECYSTECTOMY  2019  . DILATION AND CURETTAGE OF UTERUS    . Left knee surgery    . LIVER BIOPSY    . TONSILLECTOMY    . TUBAL LIGATION       reports that she has never smoked. She has never used smokeless tobacco. She reports that she does not drink alcohol or use drugs.  Allergies  Allergen Reactions  . Anesthetic Ether [Ether] Shortness Of Breath    IV anesthesia causes extreme pain and tightness in back and ribcage.  . Nubain [Nalbuphine Hcl] Anaphylaxis  . Penicillins Hives and Shortness Of Breath  DID THE REACTION INVOLVE: Swelling of the face/tongue/throat, SOB, or low BP? Yes Sudden or severe rash/hives, skin peeling, or the inside of the mouth or nose? Yes Did it require medical treatment? Yes When did it last happen?About 3 years ago If all above answers are "NO", may proceed with cephalosporin use.  Marland Kitchen Lisinopril Other (See Comments)    Pt states that this causes her bp to increase.  . Advair Diskus [Fluticasone-Salmeterol] Hives  . Lactose Intolerance (Gi) Diarrhea    Family History  Problem Relation Age of Onset  . Heart disease Mother   . Diabetes Mother   . Asthma Mother   . Anemia  Mother   . Cerebral aneurysm Mother        ruptured  . Kidney failure Mother   . Migraines Mother   . Parkinson's disease Mother   . Colon cancer Maternal Uncle   . Heart disease Maternal Uncle   . Heart disease Maternal Grandmother   . Diabetes Maternal Grandmother   . Asthma Maternal Grandmother   . Heart Problems Maternal Grandmother   . Heart Problems Other        through family  . Diabetes Granddaughter   . Diabetes Daughter     Prior to Admission medications   Medication Sig Start Date End Date Taking? Authorizing Provider  acetic acid 2 % otic solution Place 4 drops into the left ear 4 (four) times daily. 10/30/19   McDonald, Mia A, PA-C  albuterol (VENTOLIN HFA) 108 (90 Base) MCG/ACT inhaler Inhale 1-2 puffs into the lungs every 6 (six) hours as needed for wheezing or shortness of breath. 11/14/19   Olalere, Cicero Duck A, MD  amLODipine (NORVASC) 10 MG tablet Take 1 tablet (10 mg total) by mouth daily. Patient not taking: Reported on 01/02/2020 10/10/19 01/08/20  Geralynn Rile, MD  blood glucose meter kit and supplies KIT Dispense based on patient and insurance preference. Use up to four times daily as directed. (FOR ICD-9 250.00, 250.01). 03/25/18   Briscoe Deutscher, DO  glucose blood (ACCU-CHEK ACTIVE STRIPS) test strip Use as instructed 04/15/16   Nona Dell, PA-C  lidocaine (LIDODERM) 5 % Place 1 patch onto the skin daily. Remove & Discard patch within 12 hours or as directed by MD 11/08/19   Randal Buba, April, MD  losartan (COZAAR) 50 MG tablet Take 50 mg by mouth daily. 10/11/19   [provider]  methocarbamol (ROBAXIN) 500 MG tablet Take 1 tablet (500 mg total) by mouth every 8 (eight) hours as needed for muscle spasms (back pain). 10/14/19   Mesner, Corene Cornea, MD  potassium chloride SA (KLOR-CON) 20 MEQ tablet Take 20 mEq by mouth daily. 10/11/19   [provider]  umeclidinium-vilanterol (ANORO ELLIPTA) 62.5-25 MCG/INH AEPB Inhale 1 puff into the lungs  daily. 11/14/19   Laurin Coder, MD  cetirizine (ZYRTEC ALLERGY) 10 MG tablet Take 1 tablet (10 mg total) by mouth daily. 09/24/19 10/10/19  Petrucelli, Glynda Jaeger, PA-C  dicyclomine (BENTYL) 20 MG tablet Take 1 tablet (20 mg total) by mouth 2 (two) times daily. Patient not taking: Reported on 10/01/2019 08/19/19 10/01/19  Lorayne Bender, PA-C  diphenhydrAMINE (BENADRYL) 25 MG tablet Take 1 tablet (25 mg total) by mouth every 6 (six) hours as needed. Patient not taking: Reported on 10/01/2019 09/24/19 10/01/19  Petrucelli, Aldona Bar R, PA-C  escitalopram (LEXAPRO) 10 MG tablet TAKE 1 TABLET(10 MG) BY MOUTH DAILY Patient not taking: Reported on 07/05/2018 06/01/18 09/24/19  Briscoe Deutscher, DO  labetalol (NORMODYNE) 100 MG tablet Take 1 tablet (100 mg total) by mouth 2 (two) times daily. Patient not taking: Reported on 10/01/2019 08/30/19 10/01/19  Milton Ferguson, MD  prochlorperazine (COMPAZINE) 10 MG tablet Take 1 tablet (10 mg total) by mouth every 6 (six) hours as needed. May take with '25mg'$  of benadryl for migraines.. Patient not taking: Reported on 10/01/2019 03/09/19 10/01/19  Melvenia Beam, MD  rizatriptan (MAXALT-MLT) 10 MG disintegrating tablet Take 1 tablet (10 mg total) by mouth as needed for migraine. May repeat in 2 hours if needed. Maximum 2x in one day. Patient not taking: Reported on 10/14/2019 03/16/19 10/14/19  Melvenia Beam, MD  topiramate (TOPAMAX) 100 MG tablet Take 1 tablet (100 mg total) by mouth at bedtime. Patient not taking: Reported on 10/01/2019 03/09/19 10/01/19  Melvenia Beam, MD    Physical Exam: Vitals:   02/19/20 1700 02/19/20 1715 02/19/20 1900 02/19/20 1945  BP: 105/65 131/65 107/61 107/61  Pulse: (!) 50 (!) 53 (!) 51 (!) 52  Resp: (!) '22 17 20 20  '$ Temp:      TempSrc:      SpO2: 100% 100% 100% 98%  Weight:      Height:        Physical Exam  Constitutional: She is oriented to person, place, and time. She appears well-developed and well-nourished. No distress.  HENT:   Head: Normocephalic.  Eyes: Pupils are equal, round, and reactive to light. EOM are normal.  Cardiovascular: Normal rate, regular rhythm and intact distal pulses.  Pulmonary/Chest: Effort normal and breath sounds normal. No respiratory distress. She has no wheezes. She has no rales.  Abdominal: Soft. Bowel sounds are normal. She exhibits no distension. There is no guarding.  Musculoskeletal:        General: No edema.     Cervical back: Neck supple.  Neurological: She is alert and oriented to person, place, and time. No cranial nerve deficit.  Speech fluent, tongue midline, no facial droop Strength 5 out of 5 in bilateral upper and lower extremities. Sensation to light touch diminished in the right lower extremity.  Skin: Skin is warm and dry. She is not diaphoretic.    Labs on Admission: I have personally reviewed following labs and imaging studies  CBC: Recent Labs  Lab 02/19/20 1500  WBC 10.8*  NEUTROABS 7.0  HGB 14.7  HCT 43.7  MCV 98.4  PLT 175   Basic Metabolic Panel: Recent Labs  Lab 02/19/20 1500  NA 141  K 4.4  CL 107  CO2 25  GLUCOSE 114*  BUN 20  CREATININE 0.80  CALCIUM 9.5   GFR: Estimated Creatinine Clearance: 80.1 mL/min (by C-G formula based on SCr of 0.8 mg/dL). Liver Function Tests: No results for input(s): AST, ALT, ALKPHOS, BILITOT, PROT, ALBUMIN in the last 168 hours. No results for input(s): LIPASE, AMYLASE in the last 168 hours. No results for input(s): AMMONIA in the last 168 hours. Coagulation Profile: No results for input(s): INR, PROTIME in the last 168 hours. Cardiac Enzymes: No results for input(s): CKTOTAL, CKMB, CKMBINDEX, TROPONINI in the last 168 hours. BNP (last 3 results) No results for input(s): PROBNP in the last 8760 hours. HbA1C: No results for input(s): HGBA1C in the last 72 hours. CBG: Recent Labs  Lab 02/19/20 1424  GLUCAP 102*   Lipid Profile: No results for input(s): CHOL, HDL, LDLCALC, TRIG, CHOLHDL, LDLDIRECT  in the last 72 hours. Thyroid Function Tests: No results for input(s): TSH, T4TOTAL, FREET4, T3FREE, THYROIDAB  in the last 72 hours. Anemia Panel: No results for input(s): VITAMINB12, FOLATE, FERRITIN, TIBC, IRON, RETICCTPCT in the last 72 hours. Urine analysis:    Component Value Date/Time   COLORURINE STRAW (A) 11/17/2019 0221   APPEARANCEUR CLEAR 11/17/2019 0221   LABSPEC 1.005 11/17/2019 0221   PHURINE 7.0 11/17/2019 0221   GLUCOSEU NEGATIVE 11/17/2019 0221   HGBUR MODERATE (A) 11/17/2019 0221   BILIRUBINUR NEGATIVE 11/17/2019 0221   KETONESUR NEGATIVE 11/17/2019 0221   PROTEINUR NEGATIVE 11/17/2019 0221   UROBILINOGEN 0.2 05/26/2013 0048   NITRITE NEGATIVE 11/17/2019 0221   LEUKOCYTESUR NEGATIVE 11/17/2019 0221    Radiological Exams on Admission: CT Head Wo Contrast  Result Date: 02/19/2020 CLINICAL DATA:  TIA symptoms EXAM: CT HEAD WITHOUT CONTRAST TECHNIQUE: Contiguous axial images were obtained from the base of the skull through the vertex without intravenous contrast. COMPARISON:  November 17, 2019 FINDINGS: Brain: Ventricles and sulci are normal in size and configuration. There is no intracranial mass, hemorrhage, extra-axial fluid collection, or midline shift. There is minimal small vessel disease immediately adjacent to the frontal horns of the lateral ventricles. Elsewhere the brain parenchyma appears unremarkable. No evident acute infarct. Vascular: No hyperdense vessel. There is mild calcification in the carotid siphon regions. Skull: Bony calvarium appears intact. Sinuses/Orbits: Visualized paranasal sinuses are clear. Orbits appear symmetric bilaterally. Other: Mastoid air cells are clear. IMPRESSION: Minimal periventricular small vessel disease. No mass or hemorrhage. No evident acute infarct. Mild arterial vascular calcification noted. Electronically Signed   By: Lowella Grip III M.D.   On: 02/19/2020 14:52    EKG: Independently reviewed.  Sinus bradycardia,  nonspecific T wave abnormality.  Assessment/Plan Principal Problem:   TIA (transient ischemic attack) Active Problems:   Diabetes mellitus (HCC)   HTN (hypertension)   Sinus bradycardia   TIA versus CVA: Patient is presenting with complaints of right-sided weakness and numbness, slurring of speech.  Symptom onset around 9 AM on 02/19/2020.  Symptoms mostly resolved except she continues to have numbness in her right leg.  Head CT negative for acute infarct.  Does have stroke risk factors. -Telemetry monitoring -Allow for permissive hypertension-treat only if systolic blood pressures are greater than 220. -MRI of the brain without contrast -CTA head and neck pending -2D echocardiogram -Hemoglobin A1c, fasting lipid panel -Aspirin 325 p.o. now and daily -Atorvastatin 80 mg now and daily -Frequent neurochecks -PT, OT, speech therapy. -N.p.o. until cleared by bedside swallow evaluation or formal speech evaluation -Neurology Dr. Rory Percy has been consulted by ED provider, appreciate recommendations  Sinus bradycardia: Patient with complaints of lightheadedness.  Bradycardic with heart rate in the 40s to 50s.  Does take labetalol at home.  Not hypotensive. -Hold beta-blocker at this time.  Continue cardiac monitoring and check TSH level.  Hypertension: Currently normotensive.   -Allow permissive hypertension at this time  Type II diabetes mellitus: Currently not on any medications. -Check A1c.  Sliding scale insulin sensitive ACHS and CBG checks.  Pharmacy med rec pending.  DVT prophylaxis: Lovenox Code Status: Full code Family Communication: No family available at this time. Disposition Plan: Status is: Observation  The patient remains OBS appropriate and will d/c before 2 midnights.  Dispo: The patient is from: Home              Anticipated d/c is to: Home              Anticipated d/c date is: 1 day  Patient currently is not medically stable to d/c.  The medical  decision making on this patient was of high complexity and the patient is at high risk for clinical deterioration, therefore this is a level 3 visit.  Shela Leff MD [facility]  If 7PM-7AM, please contact night-coverage [email or web address]  02/19/2020, 8:00 PM

## 2020-02-19 NOTE — ED Triage Notes (Addendum)
Patient arrived by [facility] complaining od dizziness and lightheadedness following taking zanaflex this morning for first time. Symptoms resolving, alert and oriented. No neuro deficits, no pain. Speech clear, no drift.

## 2020-02-19 NOTE — ED Notes (Signed)
Report gotten from page and then give report to Joe. Covid swab done prior to move pt to yellow.

## 2020-02-19 NOTE — ED Provider Notes (Signed)
Signout received from outgoing provider team.  Agree with history and physical.  Briefly patient is a 59 year old femalePresenting with right sided weakness and numbness right face, arm and leg onset 930am. Questionable slurred speech. Lasted 15 minutes. Sweaty and light headed. All symptoms have resolved.  Consider TIA observation.    Physical Exam  BP 135/72 (BP Location: Right Arm)   Pulse 65   Temp 98.2 F (36.8 C) (Oral)   Resp 18   Ht 4\' 11"  (1.499 m)   Wt 101.1 kg   LMP  (LMP Unknown)   SpO2 97%   BMI 45.02 kg/m   Physical Exam Vitals and nursing note reviewed.  Constitutional:      General: She is not in acute distress.    Appearance: She is well-developed.  HENT:     Head: Normocephalic and atraumatic.  Eyes:     Conjunctiva/sclera: Conjunctivae normal.  Cardiovascular:     Rate and Rhythm: Normal rate and regular rhythm.     Heart sounds: No murmur.  Pulmonary:     Effort: Pulmonary effort is normal. No respiratory distress.     Breath sounds: Normal breath sounds.  Abdominal:     Palpations: Abdomen is soft.     Tenderness: There is no abdominal tenderness.  Musculoskeletal:        General: Normal range of motion.     Cervical back: Neck supple.  Skin:    General: Skin is warm and dry.  Neurological:     General: No focal deficit present.     Mental Status: She is alert and oriented to person, place, and time. Mental status is at baseline.  Psychiatric:        Mood and Affect: Mood normal.        Behavior: Behavior normal.     ED Course/Procedures     Procedures  MDM   Differential diagnosis: TIA, chronic conditions including low back pain with radiculopathy, for myalgia  ED physician interpretation of imaging: CT head without acute findings, CTA head and neck ordered ED physician interpretation of EKG: No STEMI.  Sinus bradycardia ED physician interpretation of labs: Basic lab work without critical findings requiring emergency invention.   Troponin within normal limits.  Doubt ACS  Results for orders placed or performed during the hospital encounter of 02/19/20  CBC with Differential  Result Value Ref Range   WBC 10.8 (H) 4.0 - 10.5 K/uL   RBC 4.44 3.87 - 5.11 MIL/uL   Hemoglobin 14.7 12.0 - 15.0 g/dL   HCT 43.7 36.0 - 46.0 %   MCV 98.4 80.0 - 100.0 fL   MCH 33.1 26.0 - 34.0 pg   MCHC 33.6 30.0 - 36.0 g/dL   RDW 12.5 11.5 - 15.5 %   Platelets 206 150 - 400 K/uL   nRBC 0.0 0.0 - 0.2 %   Neutrophils Relative % 65 %   Neutro Abs 7.0 1.7 - 7.7 K/uL   Lymphocytes Relative 25 %   Lymphs Abs 2.7 0.7 - 4.0 K/uL   Monocytes Relative 8 %   Monocytes Absolute 0.8 0.1 - 1.0 K/uL   Eosinophils Relative 1 %   Eosinophils Absolute 0.1 0.0 - 0.5 K/uL   Basophils Relative 0 %   Basophils Absolute 0.0 0.0 - 0.1 K/uL   Immature Granulocytes 1 %   Abs Immature Granulocytes 0.08 (H) 0.00 - 0.07 K/uL  CBG monitoring, ED  Result Value Ref Range   Glucose-Capillary 102 (H) 70 - 99 mg/dL  Comment 1 Notify RN    Comment 2 Document in Chart    CT Head Wo Contrast  Result Date: 02/19/2020 CLINICAL DATA:  TIA symptoms EXAM: CT HEAD WITHOUT CONTRAST TECHNIQUE: Contiguous axial images were obtained from the base of the skull through the vertex without intravenous contrast. COMPARISON:  November 17, 2019 FINDINGS: Brain: Ventricles and sulci are normal in size and configuration. There is no intracranial mass, hemorrhage, extra-axial fluid collection, or midline shift. There is minimal small vessel disease immediately adjacent to the frontal horns of the lateral ventricles. Elsewhere the brain parenchyma appears unremarkable. No evident acute infarct. Vascular: No hyperdense vessel. There is mild calcification in the carotid siphon regions. Skull: Bony calvarium appears intact. Sinuses/Orbits: Visualized paranasal sinuses are clear. Orbits appear symmetric bilaterally. Other: Mastoid air cells are clear. IMPRESSION: Minimal periventricular small  vessel disease. No mass or hemorrhage. No evident acute infarct. Mild arterial vascular calcification noted. Electronically Signed   By: Bretta Bang III M.D.   On: 02/19/2020 14:52    EKG Interpretation  Date/Time:  Monday Feb 19 2020 14:54:25 EDT Ventricular Rate:  46 PR Interval:    QRS Duration: 98 QT Interval:  486 QTC Calculation: 426 R Axis:   20 Text Interpretation: Sinus bradycardia Low voltage, precordial leads Borderline T abnormalities, inferior leads Borderline ST elevation, lateral leads Confirmed by Raeford Razor [phone]) on 02/19/2020 3:44:18 PM       MDM: Patient's completion of ED work-up without acute findings.  However based on patient's history of hemibody weakness numbness and tingling as well as slurred speech TIA work-up is indicated.  Neurology contacted via phone, will follow the patient was admitted.  Hospitalist service called in signout given.  Patient admitted for further treatment and evaluation.          Janeece Fitting, MD 02/20/20 1236    Raeford Razor, MD 02/20/20 1433

## 2020-02-19 NOTE — ED Provider Notes (Signed)
[facility] EMERGENCY DEPARTMENT Provider Note   CSN: [id number] Arrival date & time: 02/19/20  1108     History Chief Complaint  Patient presents with  . Numbness  . Palpitations    Susan Hogan is a 59 y.o. female w PMHx COVID-19, COPD, Fibromyalgia, DM, presenting with an episode of right face, arm, and leg numbness and weakness that began at 9:30 AM this morning.  She states symptoms lasted for 15 minutes and resolved.  During that time she felt like her face was contorting.  She states she may have had slurred speech as her son cannot understand her when she was asking for water.  She states during that episode she began feeling diaphoresis, lightheadedness with palpitations.  She feels back to her baseline now.  This is not occurred before.  She does report that she took Zanaflex at 5 AM this morning for her chronic low back pain, however this is not the first time she took that medicine.  The history is provided by the patient.       Past Medical History:  Diagnosis Date  . Anemia   . Arthritis    RA "states does not see Rheumatologist"  . Asthma   . Carpal tunnel syndrome    bilaterally  . Complication of anesthesia    difficulty breathing  . COPD (chronic obstructive pulmonary disease) ([place])   . COVID-19   . Degenerative disk disease    back  . Depression   . Diabetes mellitus   . Fibromyalgia   . GERD (gastroesophageal reflux disease)   . Headache(784.0)    migraines  . Heart murmur   . Hepatitis    In followup treatment for hepatitis C  . Hypertension    no medication for at this time, sees Dr. Dinah Beers Pcp  . Hypotensive episode    hx of  . Irritable bowel syndrome   . Kidney stone    with hematuria  . Memory loss   . Recurrent upper respiratory infection (URI)   . Shortness of breath   . Syncope     Patient Active Problem List   Diagnosis Date Noted  . Chronic migraine without aura, with intractable migraine, so  stated, with status migrainosus 03/01/2019  . Medication overuse headache 03/01/2019  . Seasonal allergic rhinitis due to pollen 03/25/2018  . Adnexal cyst 03/25/2018  . Morbid obesity ([place]) 03/25/2018  . Essential hypertension 03/25/2018  . Pure hypercholesterolemia 03/25/2018  . Sleep-disordered breathing 03/25/2018  . Vitamin D deficiency 03/25/2018  . Bilateral lower extremity edema 03/25/2018  . Acute meniscal tear of left knee 11/24/2016  . Chronic hepatitis C without hepatic coma ([place]) 11/24/2016  . Fatty liver 11/24/2016  . H/O degenerative disc disease 11/24/2016  . COPD (chronic obstructive pulmonary disease) ([place]) 10/22/2016  . Type 2 diabetes mellitus, cannot tolerate Metformin 07/25/2014  . Fibromyalgia 07/25/2014  . MDD (major depressive disorder), recurrent severe, without psychosis ([place]) 03/17/2013  . Hepatitis C 08/02/2007  . IBS (irritable bowel syndrome) 08/02/2007  . Mild persistent asthma with acute exacerbation 09/21/1962    Past Surgical History:  Procedure Laterality Date  . CHOLECYSTECTOMY  2019  . DILATION AND CURETTAGE OF UTERUS    . Left knee surgery    . LIVER BIOPSY    . TONSILLECTOMY    . TUBAL LIGATION       OB History   No obstetric history on file.     Family History  Problem Relation Age  of Onset  . Heart disease Mother   . Diabetes Mother   . Asthma Mother   . Anemia Mother   . Cerebral aneurysm Mother        ruptured  . Kidney failure Mother   . Migraines Mother   . Parkinson's disease Mother   . Colon cancer Maternal Uncle   . Heart disease Maternal Uncle   . Heart disease Maternal Grandmother   . Diabetes Maternal Grandmother   . Asthma Maternal Grandmother   . Heart Problems Maternal Grandmother   . Heart Problems Other        through family  . Diabetes Granddaughter   . Diabetes Daughter     Social History   Tobacco Use  . Smoking status: Never Smoker  . Smokeless tobacco: Never Used  Substance Use Topics  .  Alcohol use: Never    Alcohol/week: 0.0 standard drinks  . Drug use: Never    Home Medications Prior to Admission medications   Medication Sig Start Date End Date Taking? Authorizing Provider  acetic acid 2 % otic solution Place 4 drops into the left ear 4 (four) times daily. 10/30/19   McDonald, Mia A, PA-C  albuterol (VENTOLIN HFA) 108 (90 Base) MCG/ACT inhaler Inhale 1-2 puffs into the lungs every 6 (six) hours as needed for wheezing or shortness of breath. 11/14/19   Olalere, Cicero Duck A, MD  amLODipine (NORVASC) 10 MG tablet Take 1 tablet (10 mg total) by mouth daily. Patient not taking: Reported on 01/02/2020 10/10/19 01/08/20  Geralynn Rile, MD  blood glucose meter kit and supplies KIT Dispense based on patient and insurance preference. Use up to four times daily as directed. (FOR ICD-9 250.00, 250.01). 03/25/18   Briscoe Deutscher, DO  glucose blood (ACCU-CHEK ACTIVE STRIPS) test strip Use as instructed 04/15/16   Nona Dell, PA-C  lidocaine (LIDODERM) 5 % Place 1 patch onto the skin daily. Remove & Discard patch within 12 hours or as directed by MD 11/08/19   Randal Buba, April, MD  losartan (COZAAR) 50 MG tablet Take 50 mg by mouth daily. 10/11/19   [provider]  methocarbamol (ROBAXIN) 500 MG tablet Take 1 tablet (500 mg total) by mouth every 8 (eight) hours as needed for muscle spasms (back pain). 10/14/19   Mesner, Corene Cornea, MD  potassium chloride SA (KLOR-CON) 20 MEQ tablet Take 20 mEq by mouth daily. 10/11/19   [provider]  umeclidinium-vilanterol (ANORO ELLIPTA) 62.5-25 MCG/INH AEPB Inhale 1 puff into the lungs daily. 11/14/19   Laurin Coder, MD  cetirizine (ZYRTEC ALLERGY) 10 MG tablet Take 1 tablet (10 mg total) by mouth daily. 09/24/19 10/10/19  Petrucelli, Glynda Jaeger, PA-C  dicyclomine (BENTYL) 20 MG tablet Take 1 tablet (20 mg total) by mouth 2 (two) times daily. Patient not taking: Reported on 10/01/2019 08/19/19 10/01/19  Lorayne Bender, PA-C   diphenhydrAMINE (BENADRYL) 25 MG tablet Take 1 tablet (25 mg total) by mouth every 6 (six) hours as needed. Patient not taking: Reported on 10/01/2019 09/24/19 10/01/19  Petrucelli, Aldona Bar R, PA-C  escitalopram (LEXAPRO) 10 MG tablet TAKE 1 TABLET(10 MG) BY MOUTH DAILY Patient not taking: Reported on 07/05/2018 06/01/18 09/24/19  Briscoe Deutscher, DO  labetalol (NORMODYNE) 100 MG tablet Take 1 tablet (100 mg total) by mouth 2 (two) times daily. Patient not taking: Reported on 10/01/2019 08/30/19 10/01/19  Milton Ferguson, MD  prochlorperazine (COMPAZINE) 10 MG tablet Take 1 tablet (10 mg total) by mouth every 6 (six) hours as  needed. May take with '25mg'$  of benadryl for migraines.. Patient not taking: Reported on 10/01/2019 03/09/19 10/01/19  Melvenia Beam, MD  rizatriptan (MAXALT-MLT) 10 MG disintegrating tablet Take 1 tablet (10 mg total) by mouth as needed for migraine. May repeat in 2 hours if needed. Maximum 2x in one day. Patient not taking: Reported on 10/14/2019 03/16/19 10/14/19  Melvenia Beam, MD  topiramate (TOPAMAX) 100 MG tablet Take 1 tablet (100 mg total) by mouth at bedtime. Patient not taking: Reported on 10/01/2019 03/09/19 10/01/19  Melvenia Beam, MD    Allergies    Anesthetic ether [ether], Nubain [nalbuphine hcl], Penicillins, Lisinopril, Advair diskus [fluticasone-salmeterol], and Lactose intolerance (gi)  Review of Systems   Review of Systems  All other systems reviewed and are negative.   Physical Exam Updated Vital Signs BP 107/61   Pulse (!) 50   Temp 97.9 F (36.6 C) (Oral)   Resp 14   Ht '4\' 11"'$  (1.499 m)   Wt 107 kg   LMP  (LMP Unknown)   SpO2 97%   BMI 47.67 kg/m   Physical Exam Vitals and nursing note reviewed.  Constitutional:      General: She is not in acute distress.    Appearance: She is well-developed. She is not ill-appearing.  HENT:     Head: Normocephalic and atraumatic.  Eyes:     Conjunctiva/sclera: Conjunctivae normal.  Cardiovascular:      Rate and Rhythm: Normal rate and regular rhythm.  Pulmonary:     Effort: Pulmonary effort is normal.     Breath sounds: Normal breath sounds.  Abdominal:     Palpations: Abdomen is soft.  Skin:    General: Skin is warm.  Neurological:     Mental Status: She is alert.     Comments: Mental Status:  Alert, oriented, thought content appropriate, able to give a coherent history. Speech fluent without evidence of aphasia. Able to follow 2 step commands without difficulty.  Cranial Nerves:  II:  Peripheral visual fields grossly normal, pupils equal, round, reactive to light III,IV, VI: ptosis not present, extra-ocular motions intact bilaterally  V,VII: smile symmetric, facial light touch sensation equal VIII: hearing grossly normal to voice  X: uvula elevates symmetrically  XI: bilateral shoulder shrug symmetric and strong XII: midline tongue extension without fassiculations Motor:  Normal tone. 5/5 strength in upper and lower extremities bilaterally including strong and equal grip strength and dorsiflexion/plantar flexion Sensory: grossly normal in all extremities.  Cerebellar: normal finger-to-nose with bilateral upper extremities Gait: normal gait and balance CV: distal pulses palpable throughout    Psychiatric:        Behavior: Behavior normal.     ED Results / Procedures / Treatments   Labs (all labs ordered are listed, but only abnormal results are displayed) Labs Reviewed  CBC WITH DIFFERENTIAL/PLATELET - Abnormal; Notable for the following components:      Result Value   WBC 10.8 (*)    Abs Immature Granulocytes 0.08 (*)    All other components within normal limits  CBG MONITORING, ED - Abnormal; Notable for the following components:   Glucose-Capillary 102 (*)    All other components within normal limits  BASIC METABOLIC PANEL  TROPONIN I (HIGH SENSITIVITY)    EKG EKG Interpretation  Date/Time:  Monday Feb 19 2020 14:54:25 EDT Ventricular Rate:  46 PR  Interval:    QRS Duration: 98 QT Interval:  486 QTC Calculation: 426 R Axis:   20 Text Interpretation: Sinus  bradycardia Low voltage, precordial leads Borderline T abnormalities, inferior leads Borderline ST elevation, lateral leads Confirmed by Virgel Manifold [phone]) on 02/19/2020 3:44:18 PM   Radiology CT Head Wo Contrast  Result Date: 02/19/2020 CLINICAL DATA:  TIA symptoms EXAM: CT HEAD WITHOUT CONTRAST TECHNIQUE: Contiguous axial images were obtained from the base of the skull through the vertex without intravenous contrast. COMPARISON:  November 17, 2019 FINDINGS: Brain: Ventricles and sulci are normal in size and configuration. There is no intracranial mass, hemorrhage, extra-axial fluid collection, or midline shift. There is minimal small vessel disease immediately adjacent to the frontal horns of the lateral ventricles. Elsewhere the brain parenchyma appears unremarkable. No evident acute infarct. Vascular: No hyperdense vessel. There is mild calcification in the carotid siphon regions. Skull: Bony calvarium appears intact. Sinuses/Orbits: Visualized paranasal sinuses are clear. Orbits appear symmetric bilaterally. Other: Mastoid air cells are clear. IMPRESSION: Minimal periventricular small vessel disease. No mass or hemorrhage. No evident acute infarct. Mild arterial vascular calcification noted. Electronically Signed   By: Lowella Grip III M.D.   On: 02/19/2020 14:52    Procedures Procedures (including critical care time)  Medications Ordered in ED Medications  ondansetron (ZOFRAN) injection 4 mg (4 mg Intravenous Given 02/19/20 1506)    ED Course  I have reviewed the triage vital signs and the nursing notes.  Pertinent labs & imaging results that were available during my care of the patient were reviewed by me and considered in my medical decision making (see chart for details).    MDM Rules/Calculators/A&P                      Patient presenting with an episode of  right face, arm, leg numbness and weakness that lasted 15 minutes at 9:30 AM this morning.  She also experienced some palpitations and lightheadedness.  Concern for possible TIA.  No focal neuro deficits on exam on evaluation.  Labs and CT imaging ordered.  Care assumed at shift change by Dr. Laverle Patter, pending workup and re-evaluation. Consider possible admission for TIA workup.  Final Clinical Impression(s) / ED Diagnoses Final diagnoses:  None    Rx / DC Orders ED Discharge Orders    None       Quintrell Baze, [place] N, PA-C 02/19/20 1547    Pattricia Boss, MD 02/21/20 [phone]

## 2020-02-20 ENCOUNTER — Observation Stay (HOSPITAL_BASED_OUTPATIENT_CLINIC_OR_DEPARTMENT_OTHER): Payer: Medicaid Other

## 2020-02-20 ENCOUNTER — Observation Stay (HOSPITAL_COMMUNITY): Payer: Medicaid Other

## 2020-02-20 DIAGNOSIS — G459 Transient cerebral ischemic attack, unspecified: Secondary | ICD-10-CM

## 2020-02-20 DIAGNOSIS — I10 Essential (primary) hypertension: Secondary | ICD-10-CM

## 2020-02-20 DIAGNOSIS — N390 Urinary tract infection, site not specified: Secondary | ICD-10-CM | POA: Diagnosis present

## 2020-02-20 DIAGNOSIS — G43109 Migraine with aura, not intractable, without status migrainosus: Secondary | ICD-10-CM

## 2020-02-20 LAB — URINALYSIS, ROUTINE W REFLEX MICROSCOPIC
Bilirubin Urine: NEGATIVE
Glucose, UA: NEGATIVE mg/dL
Ketones, ur: NEGATIVE mg/dL
Nitrite: NEGATIVE
Protein, ur: 30 mg/dL — AB
Specific Gravity, Urine: 1.038 — ABNORMAL HIGH (ref 1.005–1.030)
WBC, UA: >50 WBC/hpf — ABNORMAL HIGH (ref 0–5)
pH: 5 (ref 5.0–8.0)

## 2020-02-20 LAB — ECHOCARDIOGRAM COMPLETE
Height: 59 in
Weight: 3566.16 oz

## 2020-02-20 LAB — TSH: TSH: 2.063 u[IU]/mL (ref 0.350–4.500)

## 2020-02-20 LAB — LIPID PANEL
Cholesterol: 123 mg/dL (ref 0–200)
HDL: 47 mg/dL (ref >40)
LDL Cholesterol: 57 mg/dL (ref 0–99)
Total CHOL/HDL Ratio: 2.6 RATIO
Triglycerides: 95 mg/dL (ref <150)
VLDL: 19 mg/dL (ref 0–40)

## 2020-02-20 LAB — SARS CORONAVIRUS 2 (TAT 6-24 HRS): SARS Coronavirus 2: NEGATIVE

## 2020-02-20 LAB — GLUCOSE, CAPILLARY
Glucose-Capillary: 102 mg/dL — ABNORMAL HIGH (ref 70–99)
Glucose-Capillary: 139 mg/dL — ABNORMAL HIGH (ref 70–99)
Glucose-Capillary: 116 mg/dL — ABNORMAL HIGH (ref 70–99)

## 2020-02-20 LAB — HEMOGLOBIN A1C
Hgb A1c MFr Bld: 5.8 % — ABNORMAL HIGH (ref 4.8–5.6)
Mean Plasma Glucose: 119.76 mg/dL

## 2020-02-20 LAB — HIV ANTIBODY (ROUTINE TESTING W REFLEX): HIV Screen 4th Generation wRfx: NONREACTIVE

## 2020-02-20 MED ORDER — DIPHENHYDRAMINE HCL 50 MG/ML IJ SOLN
12.5000 mg | Freq: Once | INTRAMUSCULAR | Status: AC
  Administered 2020-02-20: 12.5 mg via INTRAVENOUS
  Filled 2020-02-20: qty 1

## 2020-02-20 MED ORDER — ENOXAPARIN SODIUM 60 MG/0.6ML ~~LOC~~ SOLN: 50.0000 mg | SUBCUTANEOUS | Status: DC

## 2020-02-20 MED ORDER — KETOROLAC TROMETHAMINE 15 MG/ML IJ SOLN
15.0000 mg | Freq: Once | INTRAMUSCULAR | Status: AC
  Administered 2020-02-20: 15 mg via INTRAVENOUS
  Filled 2020-02-20: qty 1

## 2020-02-20 MED ORDER — CLOPIDOGREL BISULFATE 75 MG PO TABS: 75.0000 mg | ORAL_TABLET | Freq: Every day | ORAL | Status: DC

## 2020-02-20 MED ORDER — CLOPIDOGREL BISULFATE 75 MG PO TABS: 75.0000 mg | ORAL_TABLET | Freq: Every day | ORAL | 0 refills | Status: AC

## 2020-02-20 MED ORDER — PROCHLORPERAZINE EDISYLATE 10 MG/2ML IJ SOLN
10.0000 mg | Freq: Once | INTRAMUSCULAR | Status: AC
  Administered 2020-02-20: 10 mg via INTRAVENOUS
  Filled 2020-02-20: qty 2

## 2020-02-20 MED ORDER — DIPHENHYDRAMINE HCL 25 MG PO CAPS: 25.0000 mg | ORAL_CAPSULE | Freq: Three times a day (TID) | ORAL | Status: DC | PRN

## 2020-02-20 MED ORDER — ASPIRIN 81 MG PO TBEC: 81.0000 mg | DELAYED_RELEASE_TABLET | Freq: Every day | ORAL | 2 refills | Status: AC

## 2020-02-20 MED ORDER — PANTOPRAZOLE SODIUM 40 MG PO TBEC
40.0000 mg | DELAYED_RELEASE_TABLET | Freq: Every day | ORAL | Status: DC
  Administered 2020-02-20: 40 mg via ORAL
  Filled 2020-02-20: qty 1

## 2020-02-20 MED ORDER — CEPHALEXIN 500 MG PO CAPS
500.0000 mg | ORAL_CAPSULE | Freq: Three times a day (TID) | ORAL | Status: DC
  Administered 2020-02-20: 500 mg via ORAL
  Filled 2020-02-20: qty 1

## 2020-02-20 MED ORDER — ASPIRIN EC 81 MG PO TBEC: 81.0000 mg | DELAYED_RELEASE_TABLET | Freq: Every day | ORAL | Status: DC

## 2020-02-20 MED ORDER — ATORVASTATIN CALCIUM 40 MG PO TABS: 40.0000 mg | ORAL_TABLET | Freq: Every day | ORAL | 2 refills | Status: DC

## 2020-02-20 MED ORDER — ALBUTEROL SULFATE (2.5 MG/3ML) 0.083% IN NEBU: 2.5000 mg | INHALATION_SOLUTION | Freq: Four times a day (QID) | RESPIRATORY_TRACT | Status: DC | PRN

## 2020-02-20 MED ORDER — LORATADINE 10 MG PO TABS
10.0000 mg | ORAL_TABLET | Freq: Every day | ORAL | Status: DC
  Administered 2020-02-20: 10 mg via ORAL
  Filled 2020-02-20: qty 1

## 2020-02-20 MED ORDER — ATORVASTATIN CALCIUM 40 MG PO TABS: 40.0000 mg | ORAL_TABLET | Freq: Every day | ORAL | Status: DC

## 2020-02-20 MED ORDER — FLUTICASONE PROPIONATE 50 MCG/ACT NA SUSP
1.0000 | Freq: Every day | NASAL | Status: DC
  Administered 2020-02-20: 1 via NASAL
  Filled 2020-02-20: qty 16

## 2020-02-20 NOTE — Discharge Instructions (Signed)
Transient Ischemic Attack  A transient ischemic attack (TIA) is a "warning stroke" that causes stroke-like symptoms that go away quickly. A TIA does not cause lasting damage to the brain. But having a TIA is a sign that you may be at risk for a stroke. Lifestyle changes and medical treatments can help prevent a stroke. It is important to know the symptoms of a TIA and what to do. Get help right away, even if your symptoms go away. The symptoms of a TIA are the same as those of a stroke. They can happen fast, and they usually go away within minutes or hours. They can include:  Weakness or loss of feeling in your face, arm, or leg. This often happens on one side of your body.  Trouble walking.  Trouble moving your arms or legs.  Trouble talking or understanding what people are saying.  Trouble seeing.  Seeing two of one object (double vision).  Feeling dizzy.  Feeling confused.  Loss of balance or coordination.  Feeling sick to your stomach (nauseous) and throwing up (vomiting).  A very bad headache for no reason. What increases the risk? Certain things may make you more likely to have a TIA. Some of these are things that you can change, such as:  Being very overweight (obese).  Using products that contain nicotine or tobacco, such as cigarettes and e-cigarettes.  Taking birth control pills.  Not being active.  Drinking too much alcohol.  Using drugs. Other risk factors include:  Having an irregular heartbeat (atrial fibrillation).  Being African American or Hispanic.  Having had blood clots, stroke, TIA, or heart attack in the past.  Being a woman with a history of high blood pressure in pregnancy (preeclampsia).  Being over the age of 60.  Being female.  Having family history of stroke.  Having the following diseases or conditions: ? High blood pressure. ? High cholesterol. ? Diabetes. ? Heart disease. ? Sickle cell disease. ? Sleep apnea. ? Migraine  headache. ? Long-term (chronic) diseases that cause soreness and swelling (inflammation). ? Disorders that affect how your blood clots. Follow these instructions at home: Medicines   Take over-the-counter and prescription medicines only as told by your doctor.  If you were told to take aspirin or another medicine to thin your blood, take it exactly as told by your doctor. ? Taking too much of the medicine can cause bleeding. ? Taking too little of the medicine may not work to treat the problem. Eating and drinking   Eat 5 or more servings of fruits and vegetables each day.  Follow instructions from your doctor about your diet. You may need to follow a certain diet to help lower your risk of having a stroke. You may need to: ? Eat a diet that is low in fat and salt. ? Eat foods that contain a lot of fiber. ? Limit the amount of carbohydrates and sugar in your diet.  Limit alcohol intake to 1 drink a day for nonpregnant women and 2 drinks a day for men. One drink equals 12 oz of beer, 5 oz of wine, or 1 oz of hard liquor. General instructions  Keep a healthy weight.  Stay active. Try to get at least 30 minutes of activity on all or most days.  Find out if you have a condition called sleep apnea. Get treatment if needed.  Do not use any products that contain nicotine or tobacco, such as cigarettes and e-cigarettes. If you need help quitting,   ask your doctor.  Do not abuse drugs.  Keep all follow-up visits as told by your doctor. This is important. Get help right away if:  You have any signs of stroke. "BE FAST" is an easy way to remember the main warning signs: ? B - Balance. Signs are dizziness, sudden trouble walking, or loss of balance. ? E - Eyes. Signs are trouble seeing or a sudden change in how you see. ? F - Face. Signs are sudden weakness or loss of feeling of the face, or the face or eyelid drooping on one side. ? A - Arms. Signs are weakness or loss of feeling in an  arm. This happens suddenly and usually on one side of the body. ? S - Speech. Signs are sudden trouble speaking, slurred speech, or trouble understanding what people say. ? T - Time. Time to call emergency services. Write down what time symptoms started.  You have other signs of stroke, such as: ? A sudden, very bad headache with no known cause. ? Feeling sick to your stomach (nausea). ? Throwing up (vomiting). ? Jerky movements that you cannot control (seizure). These symptoms may be an emergency. Do not wait to see if the symptoms will go away. Get medical help right away. Call your local emergency services (911 in the [place] Do not drive yourself to the hospital. Summary  A transient ischemic attack (TIA) is a "warning stroke" that causes stroke-like symptoms that go away quickly.  A TIA is a medical emergency. Get help right away, even if your symptoms go away.  A TIA does not cause lasting damage to the brain.  Having a TIA is a sign that you may be at risk for a stroke. Lifestyle changes and medical treatments can help prevent a stroke. This information is not intended to replace advice given to you by your health care provider. Make sure you discuss any questions you have with your health care provider. Document Revised: 06/03/2018 Document Reviewed: 12/09/2016 Elsevier Patient Education  2020 [employer]  

## 2020-02-20 NOTE — Progress Notes (Signed)
  Echocardiogram 2D Echocardiogram has been performed.  Delcie Roch 02/20/2020, 1:28 PM

## 2020-02-20 NOTE — Progress Notes (Signed)
Patient discharged home via wheelchair with son.  
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
## 2020-02-20 NOTE — Progress Notes (Signed)
Occupational Therapy Evaluation Patient Details Name: Susan Hogan MRN: [id number] DOB: 1961/09/05 Today's Date: 02/20/2020    History of Present Illness Pt is a 59 y/o female admitted secondary to R sided numbness and facial droop. CT of the brainw as negative for any acute abnormalities. Waiting for MRI. PMH including but not limited to    Clinical Impression   Patient lives at home with son and grandchildren.  She is typically independent with devices, though reports increased difficulty lately with showering functional mobility for extended amounts of time.  This is due to chronic back pain, patient reporting she has a herniated disk.  Today patient presenting with decreased activity tolerance.  She did not display any UE loss of strength, coordination, or sensation.  Vision normal.  She did report some numbness/tingling in feet.  Patient able to perform ADLs with sup/mod I level.  Would benefit from further OT to practice tub transfers to increase safety at home, and address deficits listed below.    Follow Up Recommendations  No OT follow up;Supervision - Intermittent    Equipment Recommendations  3 in 1 bedside commode    Recommendations for Other Services       Precautions / Restrictions Precautions Precautions: None Restrictions Weight Bearing Restrictions: No      Mobility Bed Mobility Overal bed mobility: Modified Independent                Transfers Overall transfer level: Modified independent Equipment used: None                  Balance Overall balance assessment: No apparent balance deficits (not formally assessed)                                         ADL either performed or assessed with clinical judgement   ADL Overall ADL's : Needs assistance/impaired Eating/Feeding: Independent;Sitting   Grooming: Modified independent;Standing   Upper Body Bathing: Independent;Sitting   Lower Body Bathing: Supervison/ safety;Sit  to/from stand   Upper Body Dressing : Independent;Sitting   Lower Body Dressing: Supervision/safety;Sit to/from stand   Toilet Transfer: Modified Independent;Ambulation   Toileting- Clothing Manipulation and Hygiene: Modified independent;Sit to/from stand   Tub/ Engineer, structural: Supervision/safety;3 in 1   Functional mobility during ADLs: Modified independent       Vision   Vision Assessment?: No apparent visual deficits     Perception     Praxis      Pertinent Vitals/Pain Pain Assessment: Faces Faces Pain Scale: Hurts a little bit Pain Location: back, lower LE Pain Descriptors / Indicators: Aching;Pins and needles Pain Intervention(s): Monitored during session;Repositioned     Hand Dominance Right   Extremity/Trunk Assessment Upper Extremity Assessment Upper Extremity Assessment: Overall WFL for tasks assessed   Lower Extremity Assessment Lower Extremity Assessment: RLE deficits/detail RLE Deficits / Details: pt with decrease sensation to light touch throughout as compared to L       Communication Communication Communication: No difficulties   Cognition Arousal/Alertness: Awake/alert Behavior During Therapy: WFL for tasks assessed/performed Overall Cognitive Status: Within Functional Limits for tasks assessed                                     General Comments  Patient having tingling sensation in bilateral LE on lateral sides of lower leg.  down to toes.  Some on and off toe numbness reported    Exercises     Shoulder Instructions      Home Living Family/patient expects to be discharged to:: Private residence Living Arrangements: Children Available Help at Discharge: Family Type of Home: House Home Access: Stairs to enter Technical brewer of Steps: 4 Entrance Stairs-Rails: None Home Layout: One level     Bathroom Shower/Tub: Tub/shower unit         Home Equipment: Environmental consultant - 4 wheels;Cane - single point           Prior Functioning/Environment Level of Independence: Independent with assistive device(s)        Comments: Has recently been having increased difficulty showering and getting around due to chronic back/leg pain.  Does not stand or sit up for long periods of time. ambulates with a cane PRN.        OT Problem List: Decreased activity tolerance;Impaired sensation;Pain      OT Treatment/Interventions: Self-care/ADL training;Energy conservation;Manual therapy;Therapeutic activities;Patient/family education    OT Goals(Current goals can be found in the care plan section) Acute Rehab OT Goals Patient Stated Goal: to go home OT Goal Formulation: With patient Time For Goal Achievement: 03/05/20 Potential to Achieve Goals: Good  OT Frequency: Min 2X/week   Barriers to D/C:            Co-evaluation              AM-PAC OT "6 Clicks" Daily Activity     Outcome Measure Help from another person eating meals?: None Help from another person taking care of personal grooming?: None Help from another person toileting, which includes using toliet, bedpan, or urinal?: A Little Help from another person bathing (including washing, rinsing, drying)?: A Little Help from another person to put on and taking off regular upper body clothing?: None Help from another person to put on and taking off regular lower body clothing?: A Little 6 Click Score: 21   End of Session Nurse Communication: Mobility status  Activity Tolerance: Patient tolerated treatment well Patient left: in bed;with call bell/phone within reach  OT Visit Diagnosis: Other symptoms and signs involving the nervous system (M27.078)                Time: 6754-4920 OT Time Calculation (min): 13 min Charges:  OT General Charges $OT Visit: 1 Visit OT Evaluation $OT Eval Moderate Complexity: 1 Mod  August Luz, OTR/L  Phylliss Bob 02/20/2020, 11:46 AM

## 2020-02-20 NOTE — Discharge Summary (Signed)
Physician Discharge Summary  Susan Hogan CXK:[id number] DOB: November 25, 1960 DOA: 02/19/2020  PCP: Nicholes Rough, PA-C  Admit date: 02/19/2020 Discharge date: 02/20/2020  Admitted From: Home Disposition: Home  Recommendations for Outpatient Follow-up:  1. Follow up with PCP in 1-2 weeks 2. Please take aspirin and Plavix both for 3 weeks and then continue aspirin only 3. Please obtain BMP/CBC in one week 4. Please follow up with your PCP on the following pending results: Unresulted Labs (From admission, onward)    Start     Ordered   02/20/20 1251  Culture, Urine  Add-on,   AD     02/20/20 1250   02/19/20 2324  Rapid urine drug screen (hospital performed)  ONCE - STAT,   STAT     02/19/20 2323           Home Health: None Equipment/Devices: 3 in 1 commode  Discharge Condition: Stable CODE STATUS: Full code Diet recommendation: Cardiac  Subjective: Seen and examined earlier.  Patient symptoms had improved significantly. The only thing she had some tingling in the right lower extremity but right upper extremity and face symptoms had resolved.    Brief/Interim Summary: Susan Hogan is a 59 y.o. female with medical history significant of arthritis, asthma, COPD, history of COVID-19 infection, depression, diabetes, fibromyalgia, GERD, hypertension presenting to the ED via EMS with complaints of lightheadedness, right-sided numbness/ numbness, and palpitations.  CT head followed by CT angiogram of head and neck were all negative.  Patient was admitted to hospital service.  Neurology consulted.  Patient then underwent MRI of the brain which was also unremarkable.  She was ruled out of any stroke.  Her diagnosis was likely TIA.  She was seen by PT OT and they recommended outpatient PT and 3 in 1 commode which was arranged for her.  Neurology cleared her for discharge with instructions to take DAPT for 3 weeks and then continue aspirin only.  I personally discussed all the results with the patient  and recommendations from neurology.  Patient was feeling better however she did not have a ride/someone to take her home in the evening so she initially requested to keep her in the hospital.  I offered her a ride to her home with help of social worker however patient declined that and stated that she would take Melburn Popper to go home.  She was discharged in stable condition.  Discharge Diagnoses:  Principal Problem:   TIA (transient ischemic attack) Active Problems:   Diabetes mellitus ([place])   HTN (hypertension)   Sinus bradycardia   UTI (urinary tract infection)    Discharge Instructions  Discharge Instructions    Ambulatory referral to Neurology   Complete by: As directed    Follow up with stroke clinic NP (Jessica Vanschaick or Cecille Rubin, if both not available, consider Zachery Dauer, or A[place]) at [facility] in about 4 weeks. Thanks.   Ambulatory referral to Physical Therapy   Complete by: As directed    Iontophoresis - 4 mg/ml of dexamethasone: No   T.E.N.S. Unit Evaluation and Dispense as Indicated: No     Allergies as of 02/20/2020      Reactions   Anesthetic Ether [ether] Shortness Of Breath   IV anesthesia causes extreme pain and tightness in back and ribcage.   Nubain [nalbuphine Hcl] Anaphylaxis   Penicillins Hives, Shortness Of Breath   DID THE REACTION INVOLVE: Swelling of the face/tongue/throat, SOB, or low BP? Yes Sudden or severe rash/hives, skin peeling, or the inside of the  mouth or nose? Yes Did it require medical treatment? Yes When did it last happen?About 3 years ago If all above answers are "NO", may proceed with cephalosporin use.   Lisinopril Other (See Comments)   Pt states that this causes her bp to increase.   Advair Diskus [fluticasone-salmeterol] Hives   Lactose Intolerance (gi) Diarrhea   Tizanidine Palpitations, Other (See Comments)   "made my right side numb"      Medication List    STOP taking these medications   methocarbamol 500 MG  tablet Commonly known as: ROBAXIN     TAKE these medications   acetic acid 2 % otic solution Place 4 drops into the left ear 4 (four) times daily.   albuterol 108 (90 Base) MCG/ACT inhaler Commonly known as: VENTOLIN HFA Inhale 1-2 puffs into the lungs every 6 (six) hours as needed for wheezing or shortness of breath.   amLODipine 10 MG tablet Commonly known as: NORVASC Take 1 tablet (10 mg total) by mouth daily.   Anoro Ellipta 62.5-25 MCG/INH Aepb Generic drug: umeclidinium-vilanterol Inhale 1 puff into the lungs daily. What changed:   when to take this  reasons to take this   aspirin 81 MG EC tablet Take 1 tablet (81 mg total) by mouth daily. Start taking on: February 21, 2020   atorvastatin 40 MG tablet Commonly known as: LIPITOR Take 1 tablet (40 mg total) by mouth daily. Start taking on: February 21, 2020   blood glucose meter kit and supplies Kit Dispense based on patient and insurance preference. Use up to four times daily as directed. (FOR ICD-9 250.00, 250.01).   cephALEXin 500 MG capsule Commonly known as: KEFLEX Take 500 mg by mouth 3 (three) times daily.   cetirizine 10 MG tablet Commonly known as: ZYRTEC Take 10 mg by mouth daily as needed for allergies.   clopidogrel 75 MG tablet Commonly known as: PLAVIX Take 1 tablet (75 mg total) by mouth daily for 21 days. Start taking on: February 21, 2020   fluticasone 50 MCG/ACT nasal spray Commonly known as: FLONASE Place 1 spray into both nostrils daily.   glucose blood test strip Commonly known as: ACCU-CHEK ACTIVE STRIPS Use as instructed   hydrOXYzine 25 MG tablet Commonly known as: ATARAX/VISTARIL Take 25-50 mg by mouth at bedtime as needed.   lidocaine 5 % Commonly known as: Lidoderm Place 1 patch onto the skin daily. Remove & Discard patch within 12 hours or as directed by MD   metoprolol succinate 25 MG 24 hr tablet Commonly known as: TOPROL-XL Take 25 mg by mouth as needed.   pantoprazole 40 MG  tablet Commonly known as: PROTONIX Take 40 mg by mouth daily.   prochlorperazine 10 MG tablet Commonly known as: COMPAZINE Take 10 mg by mouth every 6 (six) hours as needed for nausea or vomiting.   rizatriptan 10 MG disintegrating tablet Commonly known as: MAXALT-MLT Take 10 mg by mouth 2 (two) times daily as needed for migraine.            Durable Medical Equipment  (From admission, onward)         Start     Ordered   02/20/20 1538  For home use only DME 3 n 1  Once     02/20/20 1537         [facility]. Schedule an appointment as soon as possible for a visit.   Specialty: Rehabilitation Contact information: [place]  [address] [address] [phone]       [facility]. Schedule an appointment as soon as possible for a visit in 4 week(s).   Specialty: Neurology Contact information: [place] [phone]       Nicholes Rough, PA-C Follow up in 1 week(s).   Specialty: Physician Assistant Contact information: [address] [place] [address] [phone]          Allergies  Allergen Reactions  . Anesthetic Ether [Ether] Shortness Of Breath    IV anesthesia causes extreme pain and tightness in back and ribcage.  . Nubain [Nalbuphine Hcl] Anaphylaxis  . Penicillins Hives and Shortness Of Breath    DID THE REACTION INVOLVE: Swelling of the face/tongue/throat, SOB, or low BP? Yes Sudden or severe rash/hives, skin peeling, or the inside of the mouth or nose? Yes Did it require medical treatment? Yes When did it last happen?About 3 years ago If all above answers are "NO", may proceed with cephalosporin use.  Marland Kitchen Lisinopril Other (See Comments)    Pt states that this causes her bp to increase.  . Advair Diskus [Fluticasone-Salmeterol] Hives  . Lactose Intolerance (Gi)  Diarrhea  . Tizanidine Palpitations and Other (See Comments)    "made my right side numb"    Consultations: Neurology   Procedures/Studies: CT ANGIO HEAD W OR WO CONTRAST  Result Date: 02/19/2020 CLINICAL DATA:  Right-sided weakness and numbness with slurred speech EXAM: CT ANGIOGRAPHY HEAD AND NECK TECHNIQUE: Multidetector CT imaging of the head and neck was performed using the standard protocol during bolus administration of intravenous contrast. Multiplanar CT image reconstructions and MIPs were obtained to evaluate the vascular anatomy. Carotid stenosis measurements (when applicable) are obtained utilizing NASCET criteria, using the distal internal carotid diameter as the denominator. CONTRAST:  33m OMNIPAQUE IOHEXOL 350 MG/ML SOLN COMPARISON:  None. FINDINGS: CTA NECK FINDINGS SKELETON: There is no bony spinal canal stenosis. No lytic or blastic lesion. OTHER NECK: Normal pharynx, larynx and major salivary glands. No cervical lymphadenopathy. Unremarkable thyroid gland. UPPER CHEST: No pneumothorax or pleural effusion. No nodules or masses. AORTIC ARCH: There is mild calcific atherosclerosis of the aortic arch. There is no aneurysm, dissection or hemodynamically significant stenosis of the visualized portion of the aorta. Conventional 3 vessel aortic branching pattern. The visualized proximal subclavian arteries are widely patent. RIGHT CAROTID SYSTEM: Normal without aneurysm, dissection or stenosis. LEFT CAROTID SYSTEM: Normal without aneurysm, dissection or stenosis. VERTEBRAL ARTERIES: Left dominant configuration. Both origins are clearly patent. There is no dissection, occlusion or flow-limiting stenosis to the skull base (V1-V3 segments). CTA HEAD FINDINGS POSTERIOR CIRCULATION: --Vertebral arteries: Normal V4 segments. --Inferior cerebellar arteries: Normal. --Basilar artery: Normal. --Superior cerebellar arteries: Normal. --Posterior cerebral arteries (PCA): Normal. ANTERIOR CIRCULATION:  --Intracranial internal carotid arteries: Normal. --Anterior cerebral arteries (ACA): Normal. Both A1 segments are present. Patent anterior communicating artery (a-comm). --Middle cerebral arteries (MCA): Normal. VENOUS SINUSES: As permitted by contrast timing, patent. ANATOMIC VARIANTS: None Review of the MIP images confirms the above findings. IMPRESSION: 1. No emergent large vessel occlusion or hemodynamically significant stenosis of the head or neck. 2. Aortic Atherosclerosis (ICD10-I70.0). Electronically Signed   By: KUlyses JarredM.D.   On: 02/19/2020 21:21   CT Head Wo Contrast  Result Date: 02/19/2020 CLINICAL DATA:  TIA symptoms EXAM: CT HEAD WITHOUT CONTRAST TECHNIQUE: Contiguous axial images were obtained from the base of the skull through the vertex without intravenous contrast. COMPARISON:  November 17, 2019 FINDINGS: Brain: Ventricles and sulci are normal in size and configuration. There is no intracranial mass, hemorrhage, extra-axial fluid collection, or midline shift. There is minimal small vessel disease immediately adjacent to the frontal horns of the lateral ventricles. Elsewhere the brain parenchyma appears unremarkable. No evident acute infarct. Vascular: No hyperdense vessel. There is mild calcification in the carotid siphon regions. Skull: Bony calvarium appears intact. Sinuses/Orbits: Visualized paranasal sinuses are clear. Orbits appear symmetric bilaterally. Other: Mastoid air cells are clear. IMPRESSION: Minimal periventricular small vessel disease. No mass or hemorrhage. No evident acute infarct. Mild arterial vascular calcification noted. Electronically Signed   By: Lowella Grip III M.D.   On: 02/19/2020 14:52   CT ANGIO NECK W OR WO CONTRAST  Result Date: 02/19/2020 CLINICAL DATA:  Right-sided weakness and numbness with slurred speech EXAM: CT ANGIOGRAPHY HEAD AND NECK TECHNIQUE: Multidetector CT imaging of the head and neck was performed using the standard protocol during  bolus administration of intravenous contrast. Multiplanar CT image reconstructions and MIPs were obtained to evaluate the vascular anatomy. Carotid stenosis measurements (when applicable) are obtained utilizing NASCET criteria, using the distal internal carotid diameter as the denominator. CONTRAST:  43m OMNIPAQUE IOHEXOL 350 MG/ML SOLN COMPARISON:  None. FINDINGS: CTA NECK FINDINGS SKELETON: There is no bony spinal canal stenosis. No lytic or blastic lesion. OTHER NECK: Normal pharynx, larynx and major salivary glands. No cervical lymphadenopathy. Unremarkable thyroid gland. UPPER CHEST: No pneumothorax or pleural effusion. No nodules or masses. AORTIC ARCH: There is mild calcific atherosclerosis of the aortic arch. There is no aneurysm, dissection or hemodynamically significant stenosis of the visualized portion of the aorta. Conventional 3 vessel aortic branching pattern. The visualized proximal subclavian arteries are widely patent. RIGHT CAROTID SYSTEM: Normal without aneurysm, dissection or stenosis. LEFT CAROTID SYSTEM: Normal without aneurysm, dissection or stenosis. VERTEBRAL ARTERIES: Left dominant configuration. Both origins are clearly patent. There is no dissection, occlusion or flow-limiting stenosis to the skull base (V1-V3 segments). CTA HEAD FINDINGS POSTERIOR CIRCULATION: --Vertebral arteries: Normal V4 segments. --Inferior cerebellar arteries: Normal. --Basilar artery: Normal. --Superior cerebellar arteries: Normal. --Posterior cerebral arteries (PCA): Normal. ANTERIOR CIRCULATION: --Intracranial internal carotid arteries: Normal. --Anterior cerebral arteries (ACA): Normal. Both A1 segments are present. Patent anterior communicating artery (a-comm). --Middle cerebral arteries (MCA): Normal. VENOUS SINUSES: As permitted by contrast timing, patent. ANATOMIC VARIANTS: None Review of the MIP images confirms the above findings. IMPRESSION: 1. No emergent large vessel occlusion or hemodynamically  significant stenosis of the head or neck. 2. Aortic Atherosclerosis (ICD10-I70.0). Electronically Signed   By: KUlyses JarredM.D.   On: 02/19/2020 21:21   MR BRAIN WO CONTRAST  Result Date: 02/20/2020 CLINICAL DATA:  Right-sided numbness fall follow-up EXAM: MRI HEAD WITHOUT CONTRAST TECHNIQUE: Multiplanar, multiecho pulse sequences of the brain and surrounding structures were obtained without intravenous contrast. COMPARISON:  04/10/2011 FINDINGS: Brain: There is no acute infarction or intracranial hemorrhage. There is no intracranial mass, mass effect, or edema. There is no hydrocephalus or extra-axial fluid collection. Ventricles and sulci are normal in size and configuration. Minimal patchy T2 hyperintensity in the supratentorial white matter is nonspecific but could reflect minor chronic microvascular ischemic changes Vascular: Major vessel flow voids at the skull base are preserved. Skull and upper cervical spine: Normal marrow signal is preserved. Sinuses/Orbits: Paranasal sinuses are aerated. Orbits are unremarkable. Other: Sella is unremarkable.  Mastoid air cells are clear. IMPRESSION: No acute infarction, hemorrhage, or mass. Electronically Signed   By: PAddison LankD.  On: 02/20/2020 14:46      Discharge Exam: Vitals:   02/20/20 1206 02/20/20 1600  BP: (!) 118/55 (!) 142/73  Pulse: 65 62  Resp: 17 17  Temp: 98.3 F (36.8 C) 98.1 F (36.7 C)  SpO2: 94% 97%   Vitals:   02/20/20 0512 02/20/20 0800 02/20/20 1206 02/20/20 1600  BP: 114/60 135/70 (!) 118/55 (!) 142/73  Pulse: 68 63 65 62  Resp: _0 Temp: 98.4 F (36.9 C) 97.9 F (36.6 C) 98.3 F (36.8 C) 98.1 F (36.7 C)  TempSrc: Oral Oral Oral Oral  SpO2: 95% 91% 94% 97%  Weight:      Height:        General: Pt is alert, awake, not in acute distress Cardiovascular: RRR, S1/S2 +, no rubs, no gallops Respiratory: CTA bilaterally, no wheezing, no rhonchi Abdominal: Soft, NT, ND, bowel sounds + Extremities: no  edema, no cyanosis    The results of significant diagnostics from this hospitalization (including imaging, microbiology, ancillary and laboratory) are listed below for reference.     Microbiology: Recent Results (from the past 240 hour(s))  SARS CORONAVIRUS 2 (TAT 6-24 HRS) Nasopharyngeal Nasopharyngeal Swab     Status: None   Collection Time: 02/19/20  7:17 PM   Specimen: Nasopharyngeal Swab  Result Value Ref Range Status   SARS Coronavirus 2 NEGATIVE NEGATIVE Final    Comment: (NOTE) SARS-CoV-2 target nucleic acids are NOT DETECTED. The SARS-CoV-2 RNA is generally detectable in upper and lower respiratory specimens during the acute phase of infection. Negative results do not preclude SARS-CoV-2 infection, do not rule out co-infections with other pathogens, and should not be used as the sole basis for treatment or other patient management decisions. Negative results must be combined with clinical observations, patient history, and epidemiological information. The expected result is Negative. Fact Sheet for Patients: SugarRoll.be Fact Sheet for Healthcare Providers: https://www.woods-mathews.com/ This test is not yet approved or cleared by the [place] FDA and  has been authorized for detection and/or diagnosis of SARS-CoV-2 by FDA under an Emergency Use Authorization (EUA). This EUA will remain  in effect (meaning this test can be used) for the duration of the COVID-19 declaration under Section 56 4(b)(1) of the Act, 21 U.S.C. section 360bbb-3(b)(1), unless the authorization is terminated or revoked sooner. Performed at [facility], [place] [address] [place], [place] [address]      Labs: BNP (last 3 results) Recent Labs    10/10/19 1057  BNP 37.8   Basic Metabolic Panel: Recent Labs  Lab 02/19/20 1500  NA 141  K 4.4  CL 107  CO2 25  GLUCOSE 114*  BUN 20  CREATININE 0.80  CALCIUM 9.5   Liver Function  Tests: No results for input(s): AST, ALT, ALKPHOS, BILITOT, PROT, ALBUMIN in the last 168 hours. No results for input(s): LIPASE, AMYLASE in the last 168 hours. No results for input(s): AMMONIA in the last 168 hours. CBC: Recent Labs  Lab 02/19/20 1500  WBC 10.8*  NEUTROABS 7.0  HGB 14.7  HCT 43.7  MCV 98.4  PLT 206   Cardiac Enzymes: No results for input(s): CKTOTAL, CKMB, CKMBINDEX, TROPONINI in the last 168 hours. BNP: Invalid input(s): POCBNP CBG: Recent Labs  Lab 02/19/20 1424 02/19/20 2218 02/20/20 0603 02/20/20 1110 02/20/20 1558  GLUCAP 102* 183* 102* 139* 116*   D-Dimer No results for input(s): DDIMER in the last 72 hours. Hgb A1c Recent Labs    02/20/20 0609  HGBA1C  5.8*   Lipid Profile Recent Labs    02/20/20 0609  CHOL 123  HDL 47  LDLCALC 57  TRIG 95  CHOLHDL 2.6   Thyroid function studies Recent Labs    02/20/20 0609  TSH 2.063   Anemia work up No results for input(s): VITAMINB12, FOLATE, FERRITIN, TIBC, IRON, RETICCTPCT in the last 72 hours. Urinalysis    Component Value Date/Time   COLORURINE YELLOW 02/20/2020 1115   APPEARANCEUR HAZY (A) 02/20/2020 1115   LABSPEC 1.038 (H) 02/20/2020 1115   PHURINE 5.0 02/20/2020 1115   GLUCOSEU NEGATIVE 02/20/2020 1115   HGBUR MODERATE (A) 02/20/2020 1115   BILIRUBINUR NEGATIVE 02/20/2020 1115   KETONESUR NEGATIVE 02/20/2020 1115   PROTEINUR 30 (A) 02/20/2020 1115   UROBILINOGEN 0.2 05/26/2013 0048   NITRITE NEGATIVE 02/20/2020 1115   LEUKOCYTESUR LARGE (A) 02/20/2020 1115   Sepsis Labs Invalid input(s): PROCALCITONIN,  WBC,  LACTICIDVEN Microbiology Recent Results (from the past 240 hour(s))  SARS CORONAVIRUS 2 (TAT 6-24 HRS) Nasopharyngeal Nasopharyngeal Swab     Status: None   Collection Time: 02/19/20  7:17 PM   Specimen: Nasopharyngeal Swab  Result Value Ref Range Status   SARS Coronavirus 2 NEGATIVE NEGATIVE Final    Comment: (NOTE) SARS-CoV-2 target nucleic acids are NOT  DETECTED. The SARS-CoV-2 RNA is generally detectable in upper and lower respiratory specimens during the acute phase of infection. Negative results do not preclude SARS-CoV-2 infection, do not rule out co-infections with other pathogens, and should not be used as the sole basis for treatment or other patient management decisions. Negative results must be combined with clinical observations, patient history, and epidemiological information. The expected result is Negative. Fact Sheet for Patients: SugarRoll.be Fact Sheet for Healthcare Providers: https://www.woods-mathews.com/ This test is not yet approved or cleared by the [place] FDA and  has been authorized for detection and/or diagnosis of SARS-CoV-2 by FDA under an Emergency Use Authorization (EUA). This EUA will remain  in effect (meaning this test can be used) for the duration of the COVID-19 declaration under Section 56 4(b)(1) of the Act, 21 U.S.C. section 360bbb-3(b)(1), unless the authorization is terminated or revoked sooner. Performed at [facility], [place] [address] [place], [place] [address]      Time coordinating discharge: Over 30 minutes  SIGNED:   Darliss Cheney, MD  [facility] 02/20/2020, 4:57 PM  If 7PM-7AM, please contact night-coverage [email or web address]

## 2020-02-20 NOTE — Progress Notes (Signed)
Progress Note    Susan Hogan  INO:[id number] DOB: 10/09/1960  DOA: 02/19/2020 PCP: Ladora Daniel, PA-C    Brief Narrative:     Medical records reviewed and are as summarized below:  Susan Hogan is an 59 y.o. female with a past medical history that includes arthritis, asthma, COPD, COVID-19 infection, fibromyalgia, diabetes, GERD, hypertension, obesity, persistent urinary tract infection admitted May 31 for TIA work-up.  Evaluated by neurology who opined patient's right-sided numbness and weakness resolved but given her risk factors appropriate to evaluate her for stroke/TIA.  Assessment/Plan:   Principal Problem:   TIA (transient ischemic attack) Active Problems:   HTN (hypertension)   UTI (urinary tract infection)   Diabetes mellitus (HCC)   Sinus bradycardia   #1.  TIA.  Symptoms resolved.  CT of the head with no acute abnormality, CTA head and neck unremarkable, LDL 57, hemoglobin A1c 5.8.  Awaiting MRI.  Evaluated by physical therapy who opined she would benefit from skilled PT outpatient.  Evaluated by Occupational Therapy with no recommendations.  No events on telemetry. -Follow echo results -Await MRI -Aspirin and statin -Await further recommendations from neurology  #2.  UTI.  Home medications included Keflex.  Patient reports she has been treated for urinary tract infection that persists.  Urinalysis done here consistent with UTI.  She is afebrile hemodynamically stable and nontoxic-appearing -Resume Keflex -Obtain a urine culture  #3.  Sinus bradycardia.  Chart review indicates heart rate range 48-60.  Home medications include a beta-blocker.  TSH within the limits of normal.  Beta-blocker was discontinued on admission.  Heart rate range 57-65 today -Continue to hold beta-blocker -Monitor  4.  Hypertension.  Controlled.  Home medications include amlodipine, metoprolol -Holding home antihypertensives for now -Monitor -Plan to resume amlodipine when  indicated  5.  Diabetes.  Patient reports diabetic and checks her blood sugar twice a day.  Diet controlled.  Hemoglobin A1c 5.8. -Monitor  #6.  Obesity.  BMI 44 -Nutritional consult   Family Communication/Anticipated D/C date and plan/Code Status   DVT prophylaxis: Lovenox ordered. Code Status: Full Code.  Family Communication: patient Disposition Plan: Status is: Observation  The patient remains OBS appropriate and will d/c before 2 midnights.  Dispo: The patient is from: Home              Anticipated d/c is to: Home              Anticipated d/c date is: 1 day              Patient currently is medically stable to d/c. Work up not complete         Medical Consultants:    Stroke team   Anti-Infectives:    keflex  Subjective:   Reports symptoms have resolved but that they "come and go".  Denies any pain or discomfort  Objective:    Vitals:   02/19/20 2307 02/20/20 0512 02/20/20 0800 02/20/20 1206  BP: 135/72 114/60 135/70 (!) 118/55  Pulse: 65 68 63 65  Resp: 18 18 17 17   Temp: 98.2 F (36.8 C) 98.4 F (36.9 C) 97.9 F (36.6 C) 98.3 F (36.8 C)  TempSrc: Oral Oral Oral Oral  SpO2: 97% 95% 91% 94%  Weight: 101.1 kg     Height:        Intake/Output Summary (Last 24 hours) at 02/20/2020 1254 Last data filed at 02/20/2020 0900 Gross per 24 hour  Intake 360 ml  Output --  Net  360 ml   Filed Weights   02/19/20 1115 02/19/20 2307  Weight: 107 kg 101.1 kg    Exam: General: Obese alert no acute distress CV: Regular rate and rhythm no murmur gallop or rub no lower extremity edema Respiratory: No increased work of breathing breath sounds are clear bilaterally I hear no wheeze no crackles Abdomen: Obese soft positive bowel sounds throughout nontender to palpation no guarding or rebounding Musculoskeletal: Joints without swelling/erythema full range of motion Neuro: Alert and oriented x3 speech clear facial symmetry tongue midline no pronator drift  bilateral grip 5 out of 5 bilateral lower extremity strength 5 out of 5.  Ambulating in room with steady gait  Data Reviewed:   I have personally reviewed following labs and imaging studies:  Labs: Labs show the following:   Basic Metabolic Panel: Recent Labs  Lab 02/19/20 1500  NA 141  K 4.4  CL 107  CO2 25  GLUCOSE 114*  BUN 20  CREATININE 0.80  CALCIUM 9.5   GFR Estimated Creatinine Clearance: 77.4 mL/min (by C-G formula based on SCr of 0.8 mg/dL). Liver Function Tests: No results for input(s): AST, ALT, ALKPHOS, BILITOT, PROT, ALBUMIN in the last 168 hours. No results for input(s): LIPASE, AMYLASE in the last 168 hours. No results for input(s): AMMONIA in the last 168 hours. Coagulation profile No results for input(s): INR, PROTIME in the last 168 hours.  CBC: Recent Labs  Lab 02/19/20 1500  WBC 10.8*  NEUTROABS 7.0  HGB 14.7  HCT 43.7  MCV 98.4  PLT 206   Cardiac Enzymes: No results for input(s): CKTOTAL, CKMB, CKMBINDEX, TROPONINI in the last 168 hours. BNP (last 3 results) No results for input(s): PROBNP in the last 8760 hours. CBG: Recent Labs  Lab 02/19/20 1424 02/19/20 2218 02/20/20 0603 02/20/20 1110  GLUCAP 102* 183* 102* 139*   D-Dimer: No results for input(s): DDIMER in the last 72 hours. Hgb A1c: Recent Labs    02/20/20 0609  HGBA1C 5.8*   Lipid Profile: Recent Labs    02/20/20 0609  CHOL 123  HDL 47  LDLCALC 57  TRIG 95  CHOLHDL 2.6   Thyroid function studies: Recent Labs    02/20/20 0609  TSH 2.063   Anemia work up: No results for input(s): VITAMINB12, FOLATE, FERRITIN, TIBC, IRON, RETICCTPCT in the last 72 hours. Sepsis Labs: Recent Labs  Lab 02/19/20 1500  WBC 10.8*    Microbiology Recent Results (from the past 240 hour(s))  SARS CORONAVIRUS 2 (TAT 6-24 HRS) Nasopharyngeal Nasopharyngeal Swab     Status: None   Collection Time: 02/19/20  7:17 PM   Specimen: Nasopharyngeal Swab  Result Value Ref Range  Status   SARS Coronavirus 2 NEGATIVE NEGATIVE Final    Comment: (NOTE) SARS-CoV-2 target nucleic acids are NOT DETECTED. The SARS-CoV-2 RNA is generally detectable in upper and lower respiratory specimens during the acute phase of infection. Negative results do not preclude SARS-CoV-2 infection, do not rule out co-infections with other pathogens, and should not be used as the sole basis for treatment or other patient management decisions. Negative results must be combined with clinical observations, patient history, and epidemiological information. The expected result is Negative. Fact Sheet for Patients: HairSlick.no Fact Sheet for Healthcare Providers: quierodirigir.com This test is not yet approved or cleared by the [place] FDA and  has been authorized for detection and/or diagnosis of SARS-CoV-2 by FDA under an Emergency Use Authorization (EUA). This EUA will remain  in effect (meaning this  test can be used) for the duration of the COVID-19 declaration under Section 56 4(b)(1) of the Act, 21 U.S.C. section 360bbb-3(b)(1), unless the authorization is terminated or revoked sooner. Performed at [facility], [address], [place], [place] [address]     Procedures and diagnostic studies:  CT ANGIO HEAD W OR WO CONTRAST  Result Date: 02/19/2020 CLINICAL DATA:  Right-sided weakness and numbness with slurred speech EXAM: CT ANGIOGRAPHY HEAD AND NECK TECHNIQUE: Multidetector CT imaging of the head and neck was performed using the standard protocol during bolus administration of intravenous contrast. Multiplanar CT image reconstructions and MIPs were obtained to evaluate the vascular anatomy. Carotid stenosis measurements (when applicable) are obtained utilizing NASCET criteria, using the distal internal carotid diameter as the denominator. CONTRAST:  27mL OMNIPAQUE IOHEXOL 350 MG/ML SOLN COMPARISON:  None. FINDINGS: CTA  NECK FINDINGS SKELETON: There is no bony spinal canal stenosis. No lytic or blastic lesion. OTHER NECK: Normal pharynx, larynx and major salivary glands. No cervical lymphadenopathy. Unremarkable thyroid gland. UPPER CHEST: No pneumothorax or pleural effusion. No nodules or masses. AORTIC ARCH: There is mild calcific atherosclerosis of the aortic arch. There is no aneurysm, dissection or hemodynamically significant stenosis of the visualized portion of the aorta. Conventional 3 vessel aortic branching pattern. The visualized proximal subclavian arteries are widely patent. RIGHT CAROTID SYSTEM: Normal without aneurysm, dissection or stenosis. LEFT CAROTID SYSTEM: Normal without aneurysm, dissection or stenosis. VERTEBRAL ARTERIES: Left dominant configuration. Both origins are clearly patent. There is no dissection, occlusion or flow-limiting stenosis to the skull base (V1-V3 segments). CTA HEAD FINDINGS POSTERIOR CIRCULATION: --Vertebral arteries: Normal V4 segments. --Inferior cerebellar arteries: Normal. --Basilar artery: Normal. --Superior cerebellar arteries: Normal. --Posterior cerebral arteries (PCA): Normal. ANTERIOR CIRCULATION: --Intracranial internal carotid arteries: Normal. --Anterior cerebral arteries (ACA): Normal. Both A1 segments are present. Patent anterior communicating artery (a-comm). --Middle cerebral arteries (MCA): Normal. VENOUS SINUSES: As permitted by contrast timing, patent. ANATOMIC VARIANTS: None Review of the MIP images confirms the above findings. IMPRESSION: 1. No emergent large vessel occlusion or hemodynamically significant stenosis of the head or neck. 2. Aortic Atherosclerosis (ICD10-I70.0). Electronically Signed   By: Deatra Robinson M.D.   On: 02/19/2020 21:21   CT Head Wo Contrast  Result Date: 02/19/2020 CLINICAL DATA:  TIA symptoms EXAM: CT HEAD WITHOUT CONTRAST TECHNIQUE: Contiguous axial images were obtained from the base of the skull through the vertex without intravenous  contrast. COMPARISON:  November 17, 2019 FINDINGS: Brain: Ventricles and sulci are normal in size and configuration. There is no intracranial mass, hemorrhage, extra-axial fluid collection, or midline shift. There is minimal small vessel disease immediately adjacent to the frontal horns of the lateral ventricles. Elsewhere the brain parenchyma appears unremarkable. No evident acute infarct. Vascular: No hyperdense vessel. There is mild calcification in the carotid siphon regions. Skull: Bony calvarium appears intact. Sinuses/Orbits: Visualized paranasal sinuses are clear. Orbits appear symmetric bilaterally. Other: Mastoid air cells are clear. IMPRESSION: Minimal periventricular small vessel disease. No mass or hemorrhage. No evident acute infarct. Mild arterial vascular calcification noted. Electronically Signed   By: Bretta Bang III M.D.   On: 02/19/2020 14:52   CT ANGIO NECK W OR WO CONTRAST  Result Date: 02/19/2020 CLINICAL DATA:  Right-sided weakness and numbness with slurred speech EXAM: CT ANGIOGRAPHY HEAD AND NECK TECHNIQUE: Multidetector CT imaging of the head and neck was performed using the standard protocol during bolus administration of intravenous contrast. Multiplanar CT image reconstructions and MIPs were obtained to evaluate the vascular  anatomy. Carotid stenosis measurements (when applicable) are obtained utilizing NASCET criteria, using the distal internal carotid diameter as the denominator. CONTRAST:  87mL OMNIPAQUE IOHEXOL 350 MG/ML SOLN COMPARISON:  None. FINDINGS: CTA NECK FINDINGS SKELETON: There is no bony spinal canal stenosis. No lytic or blastic lesion. OTHER NECK: Normal pharynx, larynx and major salivary glands. No cervical lymphadenopathy. Unremarkable thyroid gland. UPPER CHEST: No pneumothorax or pleural effusion. No nodules or masses. AORTIC ARCH: There is mild calcific atherosclerosis of the aortic arch. There is no aneurysm, dissection or hemodynamically significant  stenosis of the visualized portion of the aorta. Conventional 3 vessel aortic branching pattern. The visualized proximal subclavian arteries are widely patent. RIGHT CAROTID SYSTEM: Normal without aneurysm, dissection or stenosis. LEFT CAROTID SYSTEM: Normal without aneurysm, dissection or stenosis. VERTEBRAL ARTERIES: Left dominant configuration. Both origins are clearly patent. There is no dissection, occlusion or flow-limiting stenosis to the skull base (V1-V3 segments). CTA HEAD FINDINGS POSTERIOR CIRCULATION: --Vertebral arteries: Normal V4 segments. --Inferior cerebellar arteries: Normal. --Basilar artery: Normal. --Superior cerebellar arteries: Normal. --Posterior cerebral arteries (PCA): Normal. ANTERIOR CIRCULATION: --Intracranial internal carotid arteries: Normal. --Anterior cerebral arteries (ACA): Normal. Both A1 segments are present. Patent anterior communicating artery (a-comm). --Middle cerebral arteries (MCA): Normal. VENOUS SINUSES: As permitted by contrast timing, patent. ANATOMIC VARIANTS: None Review of the MIP images confirms the above findings. IMPRESSION: 1. No emergent large vessel occlusion or hemodynamically significant stenosis of the head or neck. 2. Aortic Atherosclerosis (ICD10-I70.0). Electronically Signed   By: Ulyses Jarred M.D.   On: 02/19/2020 21:21    Medications:    aspirin  325 mg Oral Daily   atorvastatin  80 mg Oral Daily   cephALEXin  500 mg Oral Q8H   enoxaparin (LOVENOX) injection  50 mg Subcutaneous Q24H   fluticasone  1 spray Each Nare Daily   insulin aspart  0-9 Units Subcutaneous TID WC   loratadine  10 mg Oral Daily   pantoprazole  40 mg Oral Daily   Continuous Infusions:   LOS: 0 days   Radene Gunning NP  [facility]   How to contact the [facility] Attending or Consulting provider [place] or covering provider during after hours [place], for this patient?  1. Check the care team in [facility] and look for a) attending/consulting [facility] provider  listed and b) the [facility] team listed 2. Log into [email or web address] and use [place]'s universal password to access. If you do not have the password, please contact the hospital operator. 3. Locate the [facility] provider you are looking for under [facility] and page to a number that you can be directly reached. 4. If you still have difficulty reaching the provider, please page the [facility] (Director on Call) for the Hospitalists listed on amion for assistance.  02/20/2020, 12:54 PM

## 2020-02-20 NOTE — Progress Notes (Signed)
Discharge teaching provided. Meds, diet, activity, follow up appointments reviewed and all questions answered. Copy of instructions given to patient and prescriptions sent to pharmacy.  

## 2020-02-20 NOTE — Progress Notes (Signed)
Pt brought to unit; observed her as she ambulated from stretcher to bed in room. Pt denied at that time any feelings of weakness or lack of sensations to r. Leg. Pt assisted to bathroom. Pt c/o pain to "my head"," its because I haven't eaten all day except that [place] sandwich I had right before I came up here". I reminded pt that the doctor's orders call for nothing to be taken by mouth except meds. Pt stated an understanding and yet still stated 'well I get headaches because of my diabetes". Monitoring continues.

## 2020-02-20 NOTE — Progress Notes (Signed)
Physical Therapy Evaluation  Clinical Impression: Pt presented supine in bed with HOB elevated, awake and willing to participate in therapy session. Prior to admission, pt reported that she was independent with ADLs and uses a cane PRN for ambulation. Pt lives with her son in a single level home with four steps to enter. At the time of evaluation, pt able to perform all functional mobility at a mod I to supervision level for safety. No need for any physical assistance or use of an AD. Pt with higher level balance deficits, specifically with head turns during ambulation. Would recommend pt f/u with OP PT for higher level balance as well as generalized strengthening. Pt would continue to benefit from skilled physical therapy services at this time while admitted and after d/c to address the below listed limitations in order to improve overall safety and independence with functional mobility.    02/20/20 0900  PT Visit Information  Last PT Received On 02/20/20  Assistance Needed +1  History of Present Illness Pt is a 59 y/o female admitted secondary to R sided numbness and facial droop. CT of the brainw as negative for any acute abnormalities. Waiting for MRI. PMH including but not limited to COPD, DM and RA.  Precautions  Precautions None  Restrictions  Weight Bearing Restrictions No  Home Living  Family/patient expects to be discharged to: Private residence  Living Arrangements Children  Available Help at Discharge Family  Type of Home House  Home Access Stairs to enter  Entrance Stairs-Number of Steps 4  Entrance Stairs-Rails None  Home Layout One level  Bathroom Shower/Tub Tub/shower unit  Home Equipment [place] - 4 wheels;Cane - single point  Prior Function  Level of Independence Independent with assistive device(s)  Comments ambulates with a cane PRN  Communication  Communication No difficulties  Pain Assessment  Pain Assessment No/denies pain  Cognition  Arousal/Alertness  Awake/alert  Behavior During Therapy WFL for tasks assessed/performed  Overall Cognitive Status Within Functional Limits for tasks assessed  Upper Extremity Assessment  Upper Extremity Assessment Overall WFL for tasks assessed  Lower Extremity Assessment  Lower Extremity Assessment RLE deficits/detail  RLE Deficits / Details pt with decrease sensation to light touch throughout as compared to L  Bed Mobility  Overal bed mobility Modified Independent  Transfers  Overall transfer level Modified independent  Equipment used None  Ambulation/Gait  Ambulation/Gait assistance Supervision  Gait Distance (Feet) 150 Feet  Assistive device None  Gait Pattern/deviations Step-through pattern;Decreased stride length  General Gait Details pt with no instability or LOB, no need for physical assistance; however, very slow pace and stopping frequently to talk  Gait velocity decreased  Modified Rankin (Stroke Patients Only)  Pre-Morbid Rankin Score 0  Modified Rankin 1  Balance  Overall balance assessment Needs assistance  Sitting-balance support Feet supported  Sitting balance-Leahy Scale Good  Standing balance support During functional activity  Standing balance-Leahy Scale Fair  High level balance activites Direction changes;Turns;Head turns  High Level Balance Comments pt with +dizziness with vertical head turns and minor LOB with horizontal head turns  PT - End of Session  Activity Tolerance Patient tolerated treatment well  Patient left in bed;with call bell/phone within reach;Other (comment) (sitting EOB to eat breakfast)  Nurse Communication Mobility status  PT Assessment  PT Recommendation/Assessment Patient needs continued PT services  PT Visit Diagnosis Other abnormalities of gait and mobility (R26.89)  PT Problem List Decreased balance;Decreased coordination  PT Plan  PT Frequency (ACUTE ONLY) Min 3X/week  PT Treatment/Interventions (ACUTE ONLY) DME instruction;Gait training;Stair  training;Therapeutic exercise;Balance training;Functional mobility training;Therapeutic activities;Neuromuscular re-education;Patient/family education  AM-PAC PT "6 Clicks" Mobility Outcome Measure (Version 2)  Help needed turning from your back to your side while in a flat bed without using bedrails? 4  Help needed moving from lying on your back to sitting on the side of a flat bed without using bedrails? 4  Help needed moving to and from a bed to a chair (including a wheelchair)? 4  Help needed standing up from a chair using your arms (e.g., wheelchair or bedside chair)? 4  Help needed to walk in hospital room? 4  Help needed climbing 3-5 steps with a railing?  3  6 Click Score 23  Consider Recommendation of Discharge To: Home with no services  PT Recommendation  Follow Up Recommendations Outpatient PT  PT equipment 3in1 (PT)  Individuals Consulted  Consulted and Agree with Results and Recommendations Patient  Acute Rehab PT Goals  Patient Stated Goal to get back into OP PT  PT Goal Formulation With patient  Time For Goal Achievement 03/05/20  Potential to Achieve Goals Good  PT Time Calculation  PT Start Time (ACUTE ONLY) 0802  PT Stop Time (ACUTE ONLY) 0820  PT Time Calculation (min) (ACUTE ONLY) 18 min  PT General Charges  $$ ACUTE PT VISIT 1 Visit  PT Evaluation  $PT Eval Moderate Complexity 1 Mod  Eduard Clos, PT, DPT  Acute Rehabilitation Services Pager [phone] Office [phone]

## 2020-02-23 LAB — URINE CULTURE: Culture: 10000 — AB

## 2020-03-02 ENCOUNTER — Other Ambulatory Visit (HOSPITAL_COMMUNITY): Payer: Medicaid Other

## 2020-03-05 ENCOUNTER — Ambulatory Visit: Payer: Medicaid Other

## 2020-03-10 ENCOUNTER — Other Ambulatory Visit: Payer: Self-pay

## 2020-03-10 ENCOUNTER — Emergency Department (HOSPITAL_BASED_OUTPATIENT_CLINIC_OR_DEPARTMENT_OTHER)
Admission: EM | Admit: 2020-03-10 | Discharge: 2020-03-11 | Disposition: A | Discharge status: Home / Self Care | Payer: Medicaid Other | Attending: Emergency Medicine | Admitting: Emergency Medicine

## 2020-03-10 ENCOUNTER — Encounter (HOSPITAL_BASED_OUTPATIENT_CLINIC_OR_DEPARTMENT_OTHER): Payer: Self-pay

## 2020-03-10 DIAGNOSIS — R0602 Shortness of breath: Secondary | ICD-10-CM | POA: Diagnosis not present

## 2020-03-10 DIAGNOSIS — J45909 Unspecified asthma, uncomplicated: Secondary | ICD-10-CM | POA: Insufficient documentation

## 2020-03-10 DIAGNOSIS — Z7982 Long term (current) use of aspirin: Secondary | ICD-10-CM | POA: Diagnosis not present

## 2020-03-10 DIAGNOSIS — Z880 Allergy status to penicillin: Secondary | ICD-10-CM | POA: Diagnosis not present

## 2020-03-10 DIAGNOSIS — R519 Headache, unspecified: Secondary | ICD-10-CM | POA: Diagnosis present

## 2020-03-10 DIAGNOSIS — J449 Chronic obstructive pulmonary disease, unspecified: Secondary | ICD-10-CM | POA: Insufficient documentation

## 2020-03-10 DIAGNOSIS — G43109 Migraine with aura, not intractable, without status migrainosus: Secondary | ICD-10-CM | POA: Diagnosis not present

## 2020-03-10 DIAGNOSIS — R110 Nausea: Secondary | ICD-10-CM | POA: Diagnosis not present

## 2020-03-10 DIAGNOSIS — R2243 Localized swelling, mass and lump, lower limb, bilateral: Secondary | ICD-10-CM | POA: Insufficient documentation

## 2020-03-10 DIAGNOSIS — I10 Essential (primary) hypertension: Secondary | ICD-10-CM | POA: Insufficient documentation

## 2020-03-10 DIAGNOSIS — E119 Type 2 diabetes mellitus without complications: Secondary | ICD-10-CM | POA: Insufficient documentation

## 2020-03-10 DIAGNOSIS — H53149 Visual discomfort, unspecified: Secondary | ICD-10-CM | POA: Insufficient documentation

## 2020-03-10 DIAGNOSIS — Z79899 Other long term (current) drug therapy: Secondary | ICD-10-CM | POA: Diagnosis not present

## 2020-03-10 DIAGNOSIS — G43009 Migraine without aura, not intractable, without status migrainosus: Secondary | ICD-10-CM

## 2020-03-10 LAB — CBG MONITORING, ED: Glucose-Capillary: 149 mg/dL — ABNORMAL HIGH (ref 70–99)

## 2020-03-10 NOTE — ED Triage Notes (Signed)
Pt presents with a gradually worsening HA that started yesterday. Pt also has a red eye on the R which pt states is typical for when her blood pressure is elevated. Pt has associated nausea.

## 2020-03-10 NOTE — ED Notes (Signed)
Dr. Horton ED Provider at bedside. 

## 2020-03-11 ENCOUNTER — Emergency Department (HOSPITAL_BASED_OUTPATIENT_CLINIC_OR_DEPARTMENT_OTHER): Payer: Medicaid Other

## 2020-03-11 MED ORDER — DIPHENHYDRAMINE HCL 50 MG/ML IJ SOLN
25.0000 mg | Freq: Once | INTRAMUSCULAR | Status: AC
  Administered 2020-03-11: 25 mg via INTRAVENOUS
  Filled 2020-03-11: qty 1

## 2020-03-11 MED ORDER — PROCHLORPERAZINE EDISYLATE 10 MG/2ML IJ SOLN
10.0000 mg | Freq: Once | INTRAMUSCULAR | Status: AC
  Administered 2020-03-11: 10 mg via INTRAVENOUS
  Filled 2020-03-11: qty 2

## 2020-03-11 NOTE — ED Notes (Signed)
X-ray at bedside

## 2020-03-11 NOTE — ED Provider Notes (Signed)
[facility] EMERGENCY DEPARTMENT Provider Note   CSN: [id number] Arrival date & time: 03/10/20  2238     History Chief Complaint  Patient presents with  . Headache    Susan Hogan is a 59 y.o. female.  HPI     This is a 59 year old female with a history of arthritis, asthma, COPD, diabetes, hypertension, migraine headaches who presents with headache.  Patient reports several day history of worsening headache.  She states that it is over the front of her head.  She reports photophobia.  No vision changes, weakness, numbness, strokelike symptoms.  She states that has gradually worsened.  Currently she rates her pain 8 out of 10.  She took her migraine medication and ibuprofen with minimal relief.  Does have associated nausea.  Also reports that she had some shortness of breath at home after having an argument with her son.  She states over the last week she has noted increasing lower extremity swelling.  No fevers, neck stiffness, cough, chest pain.  Past Medical History:  Diagnosis Date  . Anemia   . Arthritis    RA "states does not see Rheumatologist"  . Asthma   . Carpal tunnel syndrome    bilaterally  . Complication of anesthesia    difficulty breathing  . COPD (chronic obstructive pulmonary disease) ([place])   . COVID-19   . Degenerative disk disease    back  . Depression   . Diabetes mellitus   . Fibromyalgia   . GERD (gastroesophageal reflux disease)   . Headache(784.0)    migraines  . Heart murmur   . Hepatitis    In followup treatment for hepatitis C  . Hypertension    no medication for at this time, sees Dr. Dinah Beers Pcp  . Hypotensive episode    hx of  . Irritable bowel syndrome   . Kidney stone    with hematuria  . Memory loss   . Recurrent upper respiratory infection (URI)   . Shortness of breath   . Syncope     Patient Active Problem List   Diagnosis Date Noted  . UTI (urinary tract infection) 02/20/2020  . TIA (transient  ischemic attack) 02/19/2020  . Sinus bradycardia 02/19/2020  . Chronic migraine without aura, with intractable migraine, so stated, with status migrainosus 03/01/2019  . Medication overuse headache 03/01/2019  . Seasonal allergic rhinitis due to pollen 03/25/2018  . Adnexal cyst 03/25/2018  . Morbid obesity ([place]) 03/25/2018  . HTN (hypertension) 03/25/2018  . Pure hypercholesterolemia 03/25/2018  . Sleep-disordered breathing 03/25/2018  . Vitamin D deficiency 03/25/2018  . Bilateral lower extremity edema 03/25/2018  . Acute meniscal tear of left knee 11/24/2016  . Chronic hepatitis C without hepatic coma ([place]) 11/24/2016  . Fatty liver 11/24/2016  . H/O degenerative disc disease 11/24/2016  . COPD (chronic obstructive pulmonary disease) ([place]) 10/22/2016  . Diabetes mellitus ([place]) 07/25/2014  . Fibromyalgia 07/25/2014  . MDD (major depressive disorder), recurrent severe, without psychosis ([place]) 03/17/2013  . Hepatitis C 08/02/2007  . IBS (irritable bowel syndrome) 08/02/2007  . Mild persistent asthma with acute exacerbation 09/21/1962    Past Surgical History:  Procedure Laterality Date  . CHOLECYSTECTOMY  2019  . DILATION AND CURETTAGE OF UTERUS    . Left knee surgery    . LIVER BIOPSY    . TONSILLECTOMY    . TUBAL LIGATION       OB History   No obstetric history on file.  Family History  Problem Relation Age of Onset  . Heart disease Mother   . Diabetes Mother   . Asthma Mother   . Anemia Mother   . Cerebral aneurysm Mother        ruptured  . Kidney failure Mother   . Migraines Mother   . Parkinson's disease Mother   . Colon cancer Maternal Uncle   . Heart disease Maternal Uncle   . Heart disease Maternal Grandmother   . Diabetes Maternal Grandmother   . Asthma Maternal Grandmother   . Heart Problems Maternal Grandmother   . Heart Problems Other        through family  . Diabetes Granddaughter   . Diabetes Daughter     Social History   Tobacco Use    . Smoking status: Never Smoker  . Smokeless tobacco: Never Used  Vaping Use  . Vaping Use: Never used  Substance Use Topics  . Alcohol use: Never    Alcohol/week: 0.0 standard drinks  . Drug use: Never    Home Medications Prior to Admission medications   Medication Sig Start Date End Date Taking? Authorizing Provider  acetic acid 2 % otic solution Place 4 drops into the left ear 4 (four) times daily. 10/30/19   McDonald, Mia A, PA-C  albuterol (VENTOLIN HFA) 108 (90 Base) MCG/ACT inhaler Inhale 1-2 puffs into the lungs every 6 (six) hours as needed for wheezing or shortness of breath. 11/14/19   Olalere, Onnie Boer A, MD  amLODipine (NORVASC) 10 MG tablet Take 1 tablet (10 mg total) by mouth daily. 10/10/19 02/19/20  Sande Rives, MD  aspirin EC 81 MG EC tablet Take 1 tablet (81 mg total) by mouth daily. 02/21/20 05/21/20  Hughie Closs, MD  atorvastatin (LIPITOR) 40 MG tablet Take 1 tablet (40 mg total) by mouth daily. 02/21/20 05/21/20  Hughie Closs, MD  blood glucose meter kit and supplies KIT Dispense based on patient and insurance preference. Use up to four times daily as directed. (FOR ICD-9 250.00, 250.01). 03/25/18   Helane Rima, DO  cephALEXin (KEFLEX) 500 MG capsule Take 500 mg by mouth 3 (three) times daily. 02/14/20   [provider]  cetirizine (ZYRTEC) 10 MG tablet Take 10 mg by mouth daily as needed for allergies.  01/17/20   [provider]  clopidogrel (PLAVIX) 75 MG tablet Take 1 tablet (75 mg total) by mouth daily for 21 days. 02/21/20 03/13/20  Hughie Closs, MD  fluticasone (FLONASE) 50 MCG/ACT nasal spray Place 1 spray into both nostrils daily. 12/19/19   [provider]  glucose blood (ACCU-CHEK ACTIVE STRIPS) test strip Use as instructed 04/15/16   Barrett Henle, PA-C  hydrOXYzine (ATARAX/VISTARIL) 25 MG tablet Take 25-50 mg by mouth at bedtime as needed. 01/17/20   [provider]  lidocaine (LIDODERM) 5 % Place 1 patch onto the  skin daily. Remove & Discard patch within 12 hours or as directed by MD 11/08/19   Nicanor Alcon, April, MD  metoprolol succinate (TOPROL-XL) 25 MG 24 hr tablet Take 25 mg by mouth as needed.  01/17/20   [provider]  pantoprazole (PROTONIX) 40 MG tablet Take 40 mg by mouth daily. 01/04/20   [provider]  prochlorperazine (COMPAZINE) 10 MG tablet Take 10 mg by mouth every 6 (six) hours as needed for nausea or vomiting.  12/19/19   [provider]  rizatriptan (MAXALT-MLT) 10 MG disintegrating tablet Take 10 mg by mouth 2 (two) times daily as needed for migraine.  01/17/20   [provider]  umeclidinium-vilanterol (ANORO ELLIPTA) 62.5-25 MCG/INH AEPB Inhale 1 puff into the lungs daily. Patient taking differently: Inhale 1 puff into the lungs as needed (wheezing).  11/14/19   Tomma Lightning, MD  dicyclomine (BENTYL) 20 MG tablet Take 1 tablet (20 mg total) by mouth 2 (two) times daily. Patient not taking: Reported on 10/01/2019 08/19/19 10/01/19  Anselm Pancoast, PA-C  diphenhydrAMINE (BENADRYL) 25 MG tablet Take 1 tablet (25 mg total) by mouth every 6 (six) hours as needed. Patient not taking: Reported on 10/01/2019 09/24/19 10/01/19  Petrucelli, Lelon Mast R, PA-C  escitalopram (LEXAPRO) 10 MG tablet TAKE 1 TABLET(10 MG) BY MOUTH DAILY Patient not taking: Reported on 07/05/2018 06/01/18 09/24/19  Helane Rima, DO  labetalol (NORMODYNE) 100 MG tablet Take 1 tablet (100 mg total) by mouth 2 (two) times daily. Patient not taking: Reported on 10/01/2019 08/30/19 10/01/19  Bethann Berkshire, MD  topiramate (TOPAMAX) 100 MG tablet Take 1 tablet (100 mg total) by mouth at bedtime. Patient not taking: Reported on 10/01/2019 03/09/19 10/01/19  Anson Fret, MD    Allergies    Anesthetic ether [ether], Nubain [nalbuphine hcl], Penicillins, Lisinopril, Advair diskus [fluticasone-salmeterol], Lactose intolerance (gi), and Tizanidine  Review of Systems   Review of Systems    Constitutional: Negative for fever.  Eyes: Positive for photophobia. Negative for visual disturbance.  Respiratory: Positive for shortness of breath. Negative for cough.   Cardiovascular: Negative for chest pain.  Gastrointestinal: Positive for nausea. Negative for abdominal pain.  Neurological: Positive for headaches. Negative for weakness and numbness.  All other systems reviewed and are negative.   Physical Exam Updated Vital Signs BP (!) 147/79 (BP Location: Right Arm)   Pulse 79   Temp 98.7 F (37.1 C) (Oral)   Resp 20   Ht 1.499 m (4\' 11" )   Wt 98 kg   LMP  (LMP Unknown)   SpO2 97%   BMI 43.63 kg/m   Physical Exam Vitals and nursing note reviewed.  Constitutional:      Appearance: She is well-developed. She is obese. She is not ill-appearing.  HENT:     Head: Normocephalic and atraumatic.  Eyes:     Extraocular Movements: Extraocular movements intact.     Pupils: Pupils are equal, round, and reactive to light.     Comments: Right conjunctival hematoma noted  Neck:     Comments: No meningismus Cardiovascular:     Rate and Rhythm: Normal rate and regular rhythm.     Heart sounds: Normal heart sounds.  Pulmonary:     Effort: Pulmonary effort is normal. No respiratory distress.     Breath sounds: No wheezing.  Abdominal:     General: Bowel sounds are normal.     Palpations: Abdomen is soft.  Musculoskeletal:        General: Swelling present.     Cervical back: Normal range of motion and neck supple.     Comments: Somewhat limited by body habitus, trace bilateral lower extremity swelling  Skin:    General: Skin is warm and dry.  Neurological:     Mental Status: She is alert and oriented to person, place, and time.     Comments: Cranial nerves II through XII intact, fluent speech, 5 out of 5 strength in all 4 extremities, no dysmetria to finger-nose-finger     ED Results / Procedures / Treatments   Labs (all labs ordered are listed, but only abnormal  results are displayed) Labs  Reviewed  CBG MONITORING, ED - Abnormal; Notable for the following components:      Result Value   Glucose-Capillary 149 (*)    All other components within normal limits    EKG EKG Interpretation  Date/Time:  Monday March 11 2020 00:11:44 EDT Ventricular Rate:  74 PR Interval:    QRS Duration: 96 QT Interval:  371 QTC Calculation: 412 R Axis:   36 Text Interpretation: Sinus rhythm Low voltage, precordial leads Baseline wander in lead(s) V2 Confirmed by Thayer Jew [phone]) on 03/11/2020 12:42:45 AM   Radiology DG Chest Portable 1 View  Result Date: 03/11/2020 CLINICAL DATA:  Chest pain.  Headache. EXAM: PORTABLE CHEST 1 VIEW COMPARISON:  10/10/2019 FINDINGS: The cardiomediastinal contours are normal. The lungs are clear. Pulmonary vasculature is normal. No consolidation, pleural effusion, or pneumothorax. No acute osseous abnormalities are seen. IMPRESSION: No acute chest findings. Electronically Signed   By: Keith Rake M.D.   On: 03/11/2020 00:30    Procedures Procedures (including critical care time)  Medications Ordered in ED Medications  diphenhydrAMINE (BENADRYL) injection 25 mg (25 mg Intravenous Given 03/11/20 0026)  prochlorperazine (COMPAZINE) injection 10 mg (10 mg Intravenous Given 03/11/20 0026)    ED Course  I have reviewed the triage vital signs and the nursing notes.  Pertinent labs & imaging results that were available during my care of the patient were reviewed by me and considered in my medical decision making (see chart for details).    MDM Rules/Calculators/A&P                           Patient presents with headache.  History of migraines.  She is overall nontoxic and vital signs notable for blood pressure of 147/79.  Neurologic exam is normal.  No red flags for headache.  Doubt meningitis, subarachnoid hemorrhage.  Suspect migraine.  Additionally, patient reported self-limited shortness of breath.  She is nontoxic  and SPO2 97.  Pulmonary exam is normal.  Will obtain chest x-ray to evaluate for pneumonia pneumothorax.  EKG without evidence of acute ischemia or arrhythmia.  Given reports of lower extremity swelling, will also evaluate for pulmonary edema.  Chest x-ray independently reviewed by myself and is normal without evidence of any of the above.  On recheck, patient states she feels much better after migraine cocktail.  After history, exam, and medical workup I feel the patient has been appropriately medically screened and is safe for discharge home. Pertinent diagnoses were discussed with the patient. Patient was given return precautions.   Final Clinical Impression(s) / ED Diagnoses Final diagnoses:  Migraine without aura and without status migrainosus, not intractable  SOB (shortness of breath)    Rx / DC Orders ED Discharge Orders    None       Yeraldin Litzenberger, Barbette Hair, MD 03/11/20 0130

## 2020-03-28 ENCOUNTER — Other Ambulatory Visit: Payer: Self-pay | Admitting: Neurology

## 2020-04-30 IMAGING — CT CT HEAD W/O CM
4 series · 16 of 47 positions shown, 18 images · non-contrast
Comparison: CT head 11/02/2018

CLINICAL DATA: Headache, normal neurologic exam

EXAM:
CT HEAD WITHOUT CONTRAST
TECHNIQUE: Contiguous axial images were obtained from the base of the skull
through the vertex without intravenous contrast.

[Series 3: head wo · axial · 0.42mm/px · z∈[-120,+0]mm · 7 of 34 slices shown, 9 images]
[im 5/34  brain]
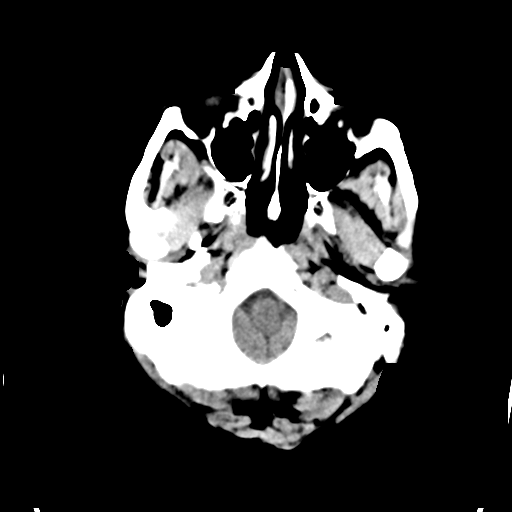
[im 5/34  bone]
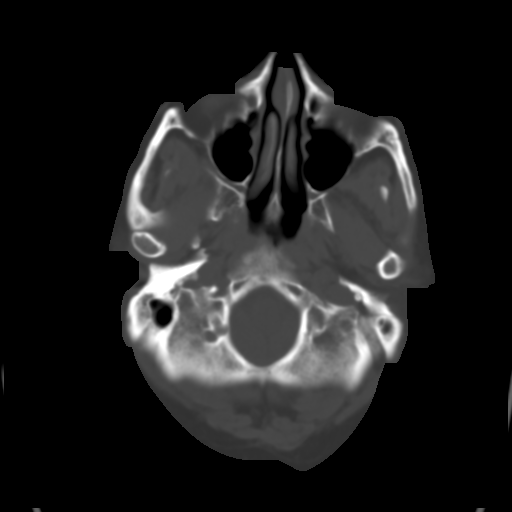
[im 9/34  brain]
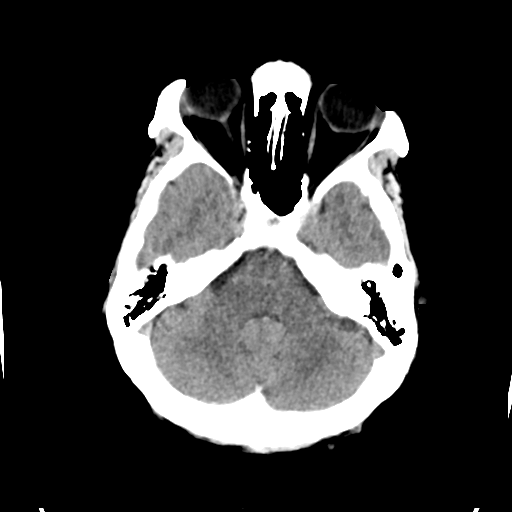
[im 13/34  brain]
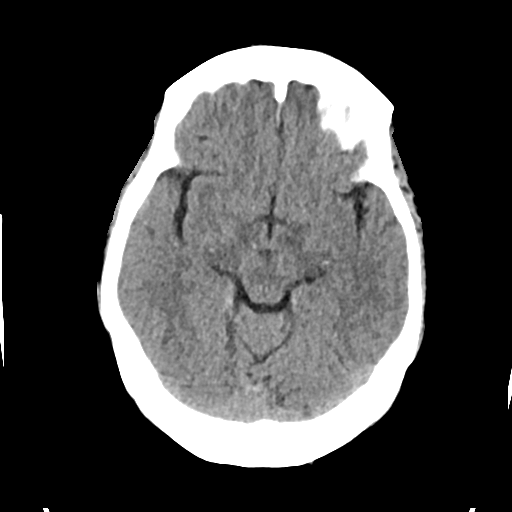
[im 17/34  brain]
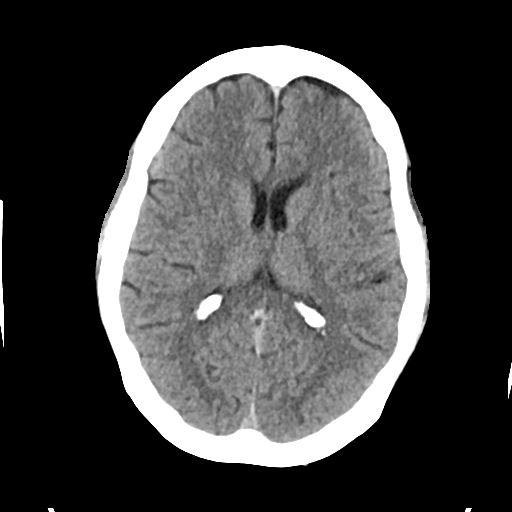
[im 21/34  brain]
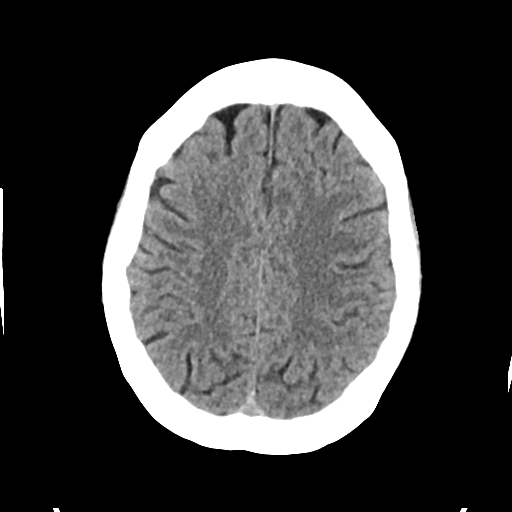
[im 21/34  bone]
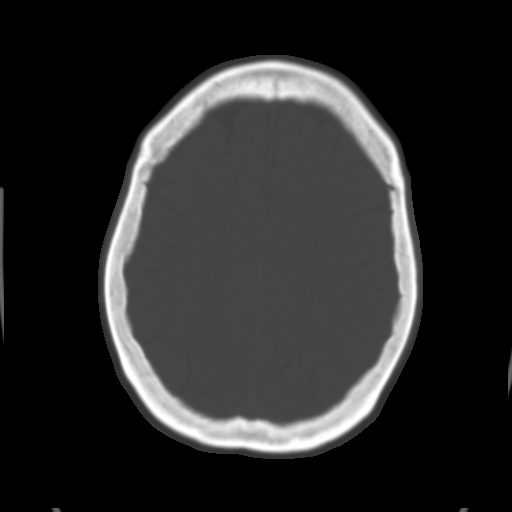
[im 25/34  brain]
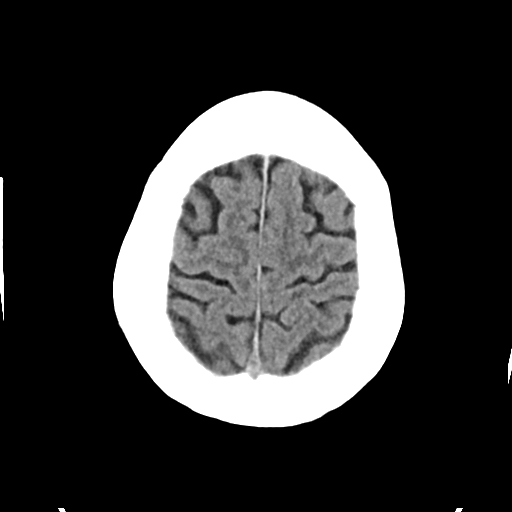
[im 29/34  brain]
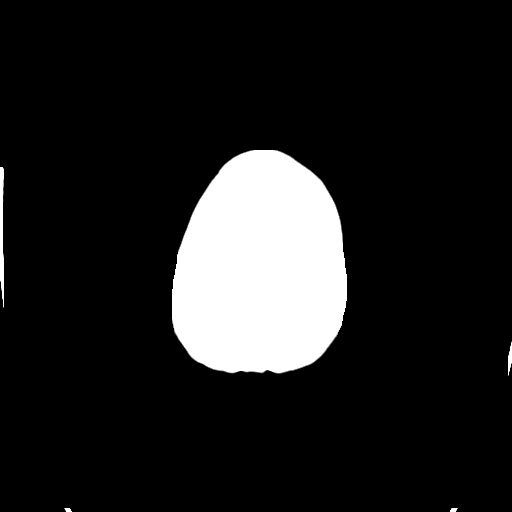

[Series 4: head bone · axial · 0.42mm/px · z∈[-124,-90]mm · 3 of 85 slices shown]
[im 9/85  bone]
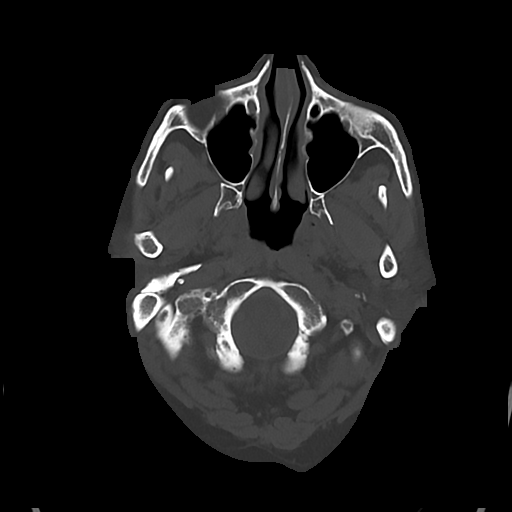
[im 17/85  bone]
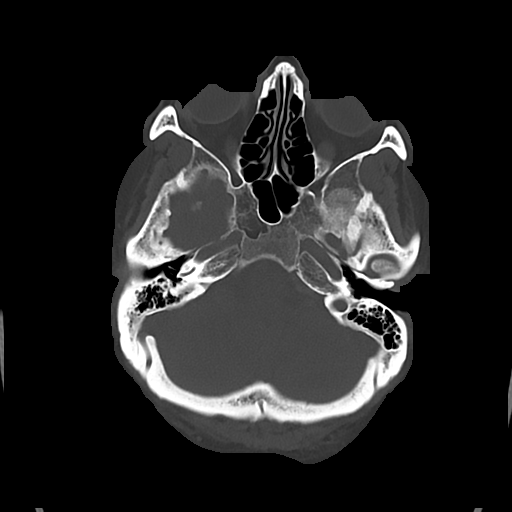
[im 26/85  bone]
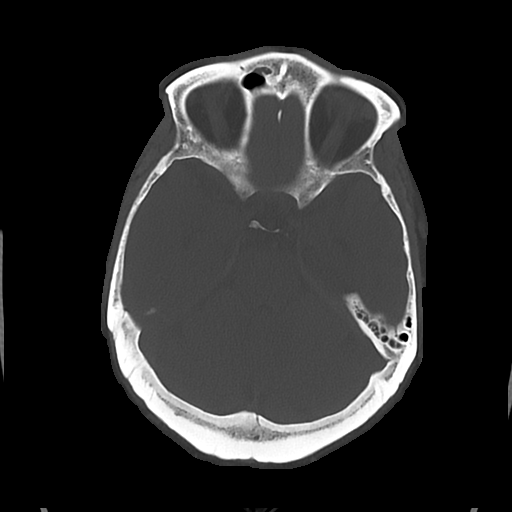

[Series 5: cor soft · coronal · 0.38mm/px · 3 of 73 slices shown]
[im 25/73  brain]
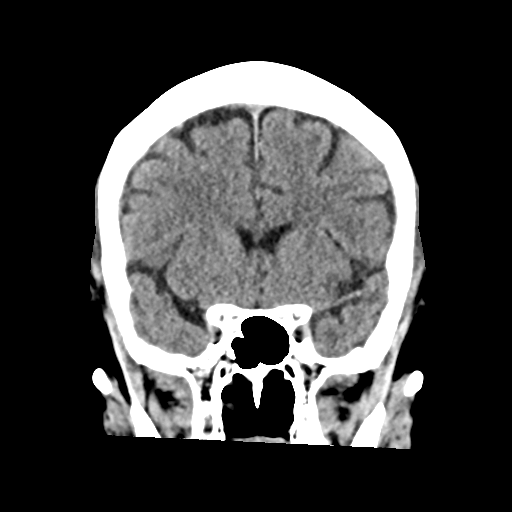
[im 33/73  brain]
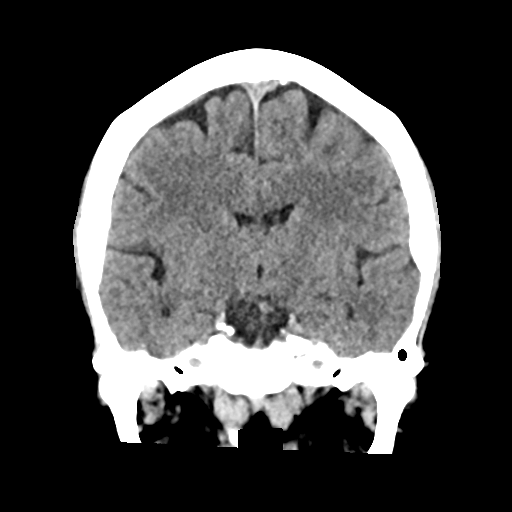
[im 41/73  brain]
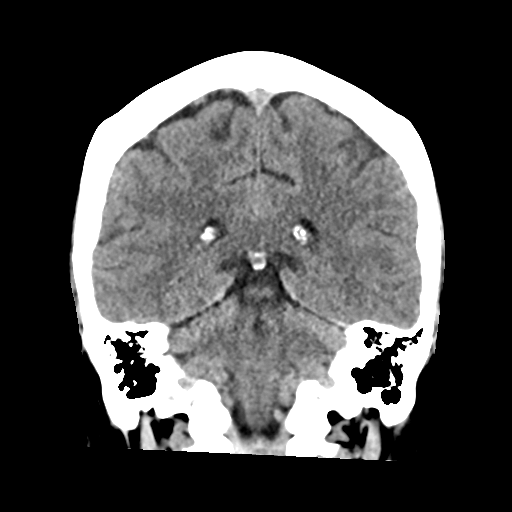

[Series 6: sag soft · sagittal · 0.38mm/px · 3 of 58 slices shown]
[im 20/58  brain]
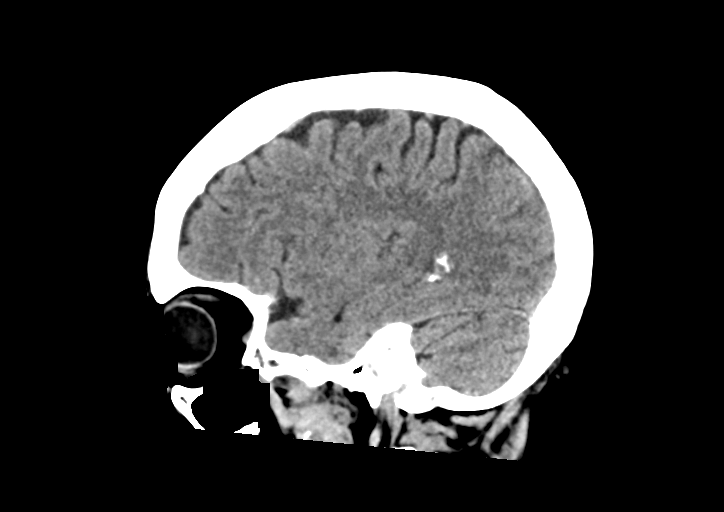
[im 29/58  brain]
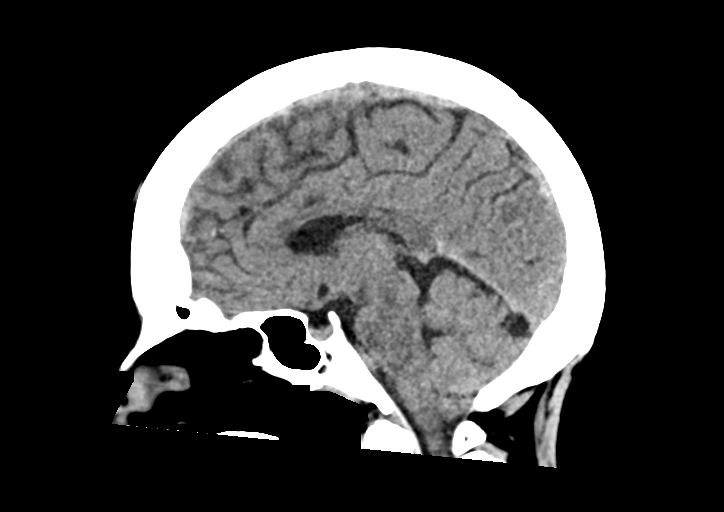
[im 39/58  brain]
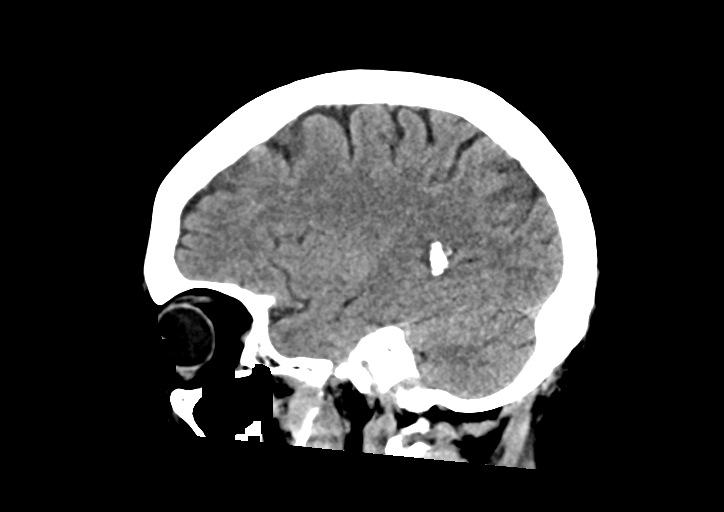

[16 of 47 positions shown; findings below may reference images not displayed]

FINDINGS: Brain: No evidence of acute infarction, hemorrhage, hydrocephalus,
extra-axial collection or mass lesion/mass effect. Symmetric
prominence of the ventricles, cisterns and sulci compatible with
parenchymal volume loss. Patchy areas of white matter
hypoattenuation are most compatible with chronic microvascular
angiopathy. Dural calcifications are similar to prior.

Vascular: Atherosclerotic calcification of the carotid siphons. No
hyperdense vessel.

Skull: No calvarial fracture or suspicious osseous lesion. No scalp
swelling or hematoma.

Sinuses/Orbits: Paranasal sinuses and mastoid air cells are
predominantly clear. Included orbital structures are unremarkable.

Other: None
IMPRESSION: No acute intracranial abnormality.

Minimal chronic microvascular angiopathy and parenchymal volume loss
similar to prior.

## 2020-06-17 ENCOUNTER — Ambulatory Visit: Payer: Medicaid Other | Admitting: Registered Nurse

## 2021-04-03 ENCOUNTER — Emergency Department (HOSPITAL_COMMUNITY): Payer: Medicaid Other

## 2021-04-03 ENCOUNTER — Emergency Department (HOSPITAL_COMMUNITY)
Admission: EM | Admit: 2021-04-03 | Discharge: 2021-04-03 | Disposition: A | Discharge status: Home / Self Care | Payer: Medicaid Other | Attending: Emergency Medicine | Admitting: Emergency Medicine

## 2021-04-03 ENCOUNTER — Encounter (HOSPITAL_COMMUNITY): Payer: Self-pay | Admitting: Emergency Medicine

## 2021-04-03 DIAGNOSIS — Z79899 Other long term (current) drug therapy: Secondary | ICD-10-CM | POA: Insufficient documentation

## 2021-04-03 DIAGNOSIS — E119 Type 2 diabetes mellitus without complications: Secondary | ICD-10-CM | POA: Diagnosis not present

## 2021-04-03 DIAGNOSIS — I10 Essential (primary) hypertension: Secondary | ICD-10-CM | POA: Diagnosis not present

## 2021-04-03 DIAGNOSIS — J45909 Unspecified asthma, uncomplicated: Secondary | ICD-10-CM | POA: Insufficient documentation

## 2021-04-03 DIAGNOSIS — R112 Nausea with vomiting, unspecified: Secondary | ICD-10-CM | POA: Insufficient documentation

## 2021-04-03 DIAGNOSIS — R1013 Epigastric pain: Secondary | ICD-10-CM | POA: Insufficient documentation

## 2021-04-03 DIAGNOSIS — R200 Anesthesia of skin: Secondary | ICD-10-CM | POA: Insufficient documentation

## 2021-04-03 DIAGNOSIS — R42 Dizziness and giddiness: Secondary | ICD-10-CM | POA: Insufficient documentation

## 2021-04-03 DIAGNOSIS — Z7952 Long term (current) use of systemic steroids: Secondary | ICD-10-CM | POA: Insufficient documentation

## 2021-04-03 DIAGNOSIS — R519 Headache, unspecified: Secondary | ICD-10-CM | POA: Insufficient documentation

## 2021-04-03 DIAGNOSIS — Z8616 Personal history of COVID-19: Secondary | ICD-10-CM | POA: Diagnosis not present

## 2021-04-03 DIAGNOSIS — J449 Chronic obstructive pulmonary disease, unspecified: Secondary | ICD-10-CM | POA: Insufficient documentation

## 2021-04-03 LAB — HEPATIC FUNCTION PANEL
ALT: 133 U/L — ABNORMAL HIGH (ref 0–44)
AST: 124 U/L — ABNORMAL HIGH (ref 15–41)
Albumin: 3.6 g/dL (ref 3.5–5.0)
Alkaline Phosphatase: 68 U/L (ref 38–126)
Bilirubin, Direct: 0.2 mg/dL (ref 0.0–0.2)
Indirect Bilirubin: 0.8 mg/dL (ref 0.3–0.9)
Total Bilirubin: 1 mg/dL (ref 0.3–1.2)
Total Protein: 6.3 g/dL — ABNORMAL LOW (ref 6.5–8.1)

## 2021-04-03 LAB — BASIC METABOLIC PANEL
Anion gap: 7 (ref 5–15)
BUN: 13 mg/dL (ref 8–23)
CO2: 26 mmol/L (ref 22–32)
Calcium: 9 mg/dL (ref 8.9–10.3)
Chloride: 105 mmol/L (ref 98–111)
Creatinine, Ser: 0.64 mg/dL (ref 0.44–1.00)
GFR, Estimated: >60 mL/min (ref >60)
Glucose, Bld: 126 mg/dL — ABNORMAL HIGH (ref 70–99)
Potassium: 3.5 mmol/L (ref 3.5–5.1)
Sodium: 138 mmol/L (ref 135–145)

## 2021-04-03 LAB — CBC
HCT: 43.9 % (ref 36.0–46.0)
Hemoglobin: 15.5 g/dL — ABNORMAL HIGH (ref 12.0–15.0)
MCH: 33.9 pg (ref 26.0–34.0)
MCHC: 35.3 g/dL (ref 30.0–36.0)
MCV: 96.1 fL (ref 80.0–100.0)
Platelets: 181 10*3/uL (ref 150–400)
RBC: 4.57 MIL/uL (ref 3.87–5.11)
RDW: 12.3 % (ref 11.5–15.5)
WBC: 9.2 10*3/uL (ref 4.0–10.5)
nRBC: 0 % (ref 0.0–0.2)

## 2021-04-03 LAB — LIPASE, BLOOD: Lipase: 29 U/L (ref 11–51)

## 2021-04-03 LAB — TROPONIN I (HIGH SENSITIVITY)
Troponin I (High Sensitivity): 4 ng/L (ref <18)
Troponin I (High Sensitivity): 5 ng/L (ref <18)

## 2021-04-03 MED ORDER — METOCLOPRAMIDE HCL 5 MG/ML IJ SOLN
10.0000 mg | Freq: Once | INTRAMUSCULAR | Status: AC
  Administered 2021-04-03: 10 mg via INTRAVENOUS
  Filled 2021-04-03: qty 2

## 2021-04-03 MED ORDER — SODIUM CHLORIDE 0.9 % IV BOLUS
500.0000 mL | Freq: Once | INTRAVENOUS | Status: AC
  Administered 2021-04-03: 500 mL via INTRAVENOUS

## 2021-04-03 NOTE — ED Triage Notes (Addendum)
Patient reports insomnia secondary to leg pain that started three days ago, also reports neck pain, dizziness, feeling hot, and abdominal pain that started yesterday. Patient alert, oriented, and in no apparent distress at this time.

## 2021-04-03 NOTE — ED Notes (Signed)
Pt given sandwich bag 

## 2021-04-03 NOTE — ED Provider Notes (Signed)
[facility] Provider Note   CSN: [id number] Arrival date & time: 04/03/21  1105     History Chief Complaint  Patient presents with   Dizziness    Susan Hogan is a 60 y.o. female.  The history is provided by the patient and medical records.  Dizziness Susan Hogan is a 60 y.o. female who presents to the Emergency Department complaining of multiple complaints. She presents the emergency department complaining of difficulty sleeping with numbness in her legs as well as headache and vomiting. Symptoms started three days ago with poor sleep due to tingling described as numbness and pins and needles sensation in bilateral feet that radiates to her hip and knee at times. Symptoms limit her from being able to sleep. Yesterday she developed neck pain that then radiated to her head. She also developed epigastric abdominal pain yesterday. Today she developed nausea and vomiting. No hematemesis. She has no fevers, night sweats, chest pain, difficulty breathing she has a history of degenerative disc disease, diabetes. No recent injuries.     Past Medical History:  Diagnosis Date   Anemia    Arthritis    RA "states does not see Rheumatologist"   Asthma    Carpal tunnel syndrome    bilaterally   Complication of anesthesia    difficulty breathing   COPD (chronic obstructive pulmonary disease) (HCC)    COVID-19    Degenerative disk disease    back   Depression    Diabetes mellitus    Fibromyalgia    GERD (gastroesophageal reflux disease)    Headache(784.0)    migraines   Heart murmur    Hepatitis    In followup treatment for hepatitis C   Hypertension    no medication for at this time, sees Dr. Dinah Beers Pcp   Hypotensive episode    hx of   Irritable bowel syndrome    Kidney stone    with hematuria   Memory loss    Recurrent upper respiratory infection (URI)    Shortness of breath    Syncope     Patient Active Problem List    Diagnosis Date Noted   UTI (urinary tract infection) 02/20/2020   TIA (transient ischemic attack) 02/19/2020   Sinus bradycardia 02/19/2020   Chronic migraine without aura, with intractable migraine, so stated, with status migrainosus 03/01/2019   Medication overuse headache 03/01/2019   Seasonal allergic rhinitis due to pollen 03/25/2018   Adnexal cyst 03/25/2018   Morbid obesity ([place]) 03/25/2018   HTN (hypertension) 03/25/2018   Pure hypercholesterolemia 03/25/2018   Sleep-disordered breathing 03/25/2018   Vitamin D deficiency 03/25/2018   Bilateral lower extremity edema 03/25/2018   Acute meniscal tear of left knee 11/24/2016   Chronic hepatitis C without hepatic coma ([place]) 11/24/2016   Fatty liver 11/24/2016   H/O degenerative disc disease 11/24/2016   COPD (chronic obstructive pulmonary disease) ([place]) 10/22/2016   Diabetes mellitus ([place]) 07/25/2014   Fibromyalgia 07/25/2014   MDD (major depressive disorder), recurrent severe, without psychosis ([place]) 03/17/2013   Hepatitis C 08/02/2007   IBS (irritable bowel syndrome) 08/02/2007   Mild persistent asthma with acute exacerbation 09/21/1962    Past Surgical History:  Procedure Laterality Date   CHOLECYSTECTOMY  2019   DILATION AND CURETTAGE OF UTERUS     Left knee surgery     LIVER BIOPSY     TONSILLECTOMY     TUBAL LIGATION       OB History   No  obstetric history on file.     Family History  Problem Relation Age of Onset   Heart disease Mother    Diabetes Mother    Asthma Mother    Anemia Mother    Cerebral aneurysm Mother        ruptured   Kidney failure Mother    Migraines Mother    Parkinson's disease Mother    Colon cancer Maternal Uncle    Heart disease Maternal Uncle    Heart disease Maternal Grandmother    Diabetes Maternal Grandmother    Asthma Maternal Grandmother    Heart Problems Maternal Grandmother    Heart Problems Other        through family   Diabetes Granddaughter    Diabetes Daughter      Social History   Tobacco Use   Smoking status: Never   Smokeless tobacco: Never  Vaping Use   Vaping Use: Never used  Substance Use Topics   Alcohol use: Never    Alcohol/week: 0.0 standard drinks   Drug use: Never    Home Medications Prior to Admission medications   Medication Sig Start Date End Date Taking? Authorizing Provider  acetic acid 2 % otic solution Place 4 drops into the left ear 4 (four) times daily. 10/30/19   McDonald, Mia A, PA-C  albuterol (VENTOLIN HFA) 108 (90 Base) MCG/ACT inhaler Inhale 1-2 puffs into the lungs every 6 (six) hours as needed for wheezing or shortness of breath. 11/14/19   Olalere, Cicero Duck A, MD  amLODipine (NORVASC) 10 MG tablet Take 1 tablet (10 mg total) by mouth daily. 10/10/19 02/19/20  O'NealCassie Freer, MD  atorvastatin (LIPITOR) 40 MG tablet Take 1 tablet (40 mg total) by mouth daily. 02/21/20 05/21/20  Darliss Cheney, MD  blood glucose meter kit and supplies KIT Dispense based on patient and insurance preference. Use up to four times daily as directed. (FOR ICD-9 250.00, 250.01). 03/25/18   Briscoe Deutscher, DO  cephALEXin (KEFLEX) 500 MG capsule Take 500 mg by mouth 3 (three) times daily. 02/14/20   [provider]  cetirizine (ZYRTEC) 10 MG tablet Take 10 mg by mouth daily as needed for allergies.  01/17/20   [provider]  fluticasone (FLONASE) 50 MCG/ACT nasal spray Place 1 spray into both nostrils daily. 12/19/19   [provider]  glucose blood (ACCU-CHEK ACTIVE STRIPS) test strip Use as instructed 04/15/16   Nona Dell, PA-C  hydrOXYzine (ATARAX/VISTARIL) 25 MG tablet Take 25-50 mg by mouth at bedtime as needed. 01/17/20   [provider]  lidocaine (LIDODERM) 5 % Place 1 patch onto the skin daily. Remove & Discard patch within 12 hours or as directed by MD 11/08/19   Randal Buba, April, MD  metoprolol succinate (TOPROL-XL) 25 MG 24 hr tablet Take 25 mg by mouth as needed.  01/17/20   [provider]  pantoprazole (PROTONIX) 40 MG tablet Take 40 mg by mouth daily. 01/04/20   [provider]  prochlorperazine (COMPAZINE) 10 MG tablet Take 10 mg by mouth every 6 (six) hours as needed for nausea or vomiting.  12/19/19   [provider]  rizatriptan (MAXALT-MLT) 10 MG disintegrating tablet Take 10 mg by mouth 2 (two) times daily as needed for migraine.  01/17/20   [provider]  umeclidinium-vilanterol (ANORO ELLIPTA) 62.5-25 MCG/INH AEPB Inhale 1 puff into the lungs daily. Patient taking differently: Inhale 1 puff into the lungs as needed (wheezing).  11/14/19   Laurin Coder, MD  dicyclomine (BENTYL) 20 MG tablet Take 1 tablet (20 mg total) by mouth 2 (two) times daily. Patient not taking: Reported on 10/01/2019 08/19/19 10/01/19  Lorayne Bender, PA-C  diphenhydrAMINE (BENADRYL) 25 MG tablet Take 1 tablet (25 mg total) by mouth every 6 (six) hours as needed. Patient not taking: Reported on 10/01/2019 09/24/19 10/01/19  Petrucelli, Aldona Bar R, PA-C  escitalopram (LEXAPRO) 10 MG tablet TAKE 1 TABLET(10 MG) BY MOUTH DAILY Patient not taking: Reported on 07/05/2018 06/01/18 09/24/19  Briscoe Deutscher, DO  labetalol (NORMODYNE) 100 MG tablet Take 1 tablet (100 mg total) by mouth 2 (two) times daily. Patient not taking: Reported on 10/01/2019 08/30/19 10/01/19  Milton Ferguson, MD  topiramate (TOPAMAX) 100 MG tablet Take 1 tablet (100 mg total) by mouth at bedtime. Patient not taking: Reported on 10/01/2019 03/09/19 10/01/19  Melvenia Beam, MD    Allergies    Anesthetic ether [ether], Nubain [nalbuphine hcl], Penicillins, Lisinopril, Advair diskus [fluticasone-salmeterol], Lactose intolerance (gi), and Tizanidine  Review of Systems   Review of Systems  Neurological:  Positive for dizziness.  All other systems reviewed and are negative.  Physical Exam Updated Vital Signs BP (!) 150/78 (BP Location: Right Arm)   Pulse 64   Temp 97.7 F (36.5 C) (Oral)   Resp 16    LMP  (LMP Unknown)   SpO2 98%   Physical Exam Vitals and nursing note reviewed.  Constitutional:      Appearance: She is well-developed.  HENT:     Head: Normocephalic and atraumatic.  Cardiovascular:     Rate and Rhythm: Normal rate and regular rhythm.     Heart sounds: No murmur heard. Pulmonary:     Effort: Pulmonary effort is normal. No respiratory distress.     Breath sounds: Normal breath sounds.  Abdominal:     Palpations: Abdomen is soft.     Tenderness: There is no abdominal tenderness. There is no guarding or rebound.  Musculoskeletal:        General: No tenderness.     Cervical back: Neck supple.     Comments: 2+ DP pulses bilaterally. Five out of five strength in all four extremities with sensation to light touch intact in all four extremities  Skin:    General: Skin is warm and dry.  Neurological:     Mental Status: She is alert and oriented to person, place, and time.  Psychiatric:        Behavior: Behavior normal.    ED Results / Procedures / Treatments   Labs (all labs ordered are listed, but only abnormal results are displayed) Labs Reviewed  BASIC METABOLIC PANEL - Abnormal; Notable for the following components:      Result Value   Glucose, Bld 126 (*)    All other components within normal limits  CBC - Abnormal; Notable for the following components:   Hemoglobin 15.5 (*)    All other components within normal limits  HEPATIC FUNCTION PANEL - Abnormal; Notable for the following components:   Total Protein 6.3 (*)    AST 124 (*)    ALT 133 (*)    All other components within normal limits  LIPASE, BLOOD  URINALYSIS, ROUTINE W REFLEX MICROSCOPIC  TROPONIN I (HIGH SENSITIVITY)  TROPONIN I (HIGH SENSITIVITY)    EKG EKG Interpretation  Date/Time:  Thursday April 03 2021 11:37:18 EDT Ventricular Rate:  69 PR Interval:  160 QRS Duration: 88 QT Interval:  404 QTC Calculation: 432 R Axis:   21 Text  Interpretation: Normal sinus rhythm Cannot rule  out Anterior infarct , age undetermined Abnormal ECG Confirmed by Quintella Reichert [id number]) on 04/03/2021 4:11:28 PM  Radiology DG Chest 2 View  Result Date: 04/03/2021 CLINICAL DATA:  Chest pain and shortness of breath for a few days, worse today. EXAM: CHEST - 2 VIEW COMPARISON:  Single-view of the chest 03/11/2020. FINDINGS: The lungs are clear. Heart size is normal. No pneumothorax or pleural fluid. No bony abnormality. IMPRESSION: Normal chest. Electronically Signed   By: Inge Rise M.D.   On: 04/03/2021 12:14   CT Head Wo Contrast  Result Date: 04/03/2021 CLINICAL DATA:  60 year old female with headache. EXAM: CT HEAD WITHOUT CONTRAST CT CERVICAL SPINE WITHOUT CONTRAST TECHNIQUE: Multidetector CT imaging of the head and cervical spine was performed following the standard protocol without intravenous contrast. Multiplanar CT image reconstructions of the cervical spine were also generated. COMPARISON:  Brain MRI dated 02/20/2020 and head CT dated 02/19/2020 FINDINGS: CT HEAD FINDINGS Brain: The ventricles and sulci appropriate size for patient's age. The gray-white matter discrimination is preserved. There is no acute intracranial hemorrhage. No mass effect or midline shift no extra-axial fluid collection. Vascular: No hyperdense vessel or unexpected calcification. Skull: Normal. Negative for fracture or focal lesion. Sinuses/Orbits: No acute finding. Other: None CT CERVICAL SPINE FINDINGS Alignment: No acute subluxation. There is straightening of normal cervical lordosis which may be positional or due to muscle spasm. Skull base and vertebrae: No acute fracture Soft tissues and spinal canal: No prevertebral fluid or swelling. No visible canal hematoma. Disc levels: No acute findings. No significant degenerative changes. Upper chest: Negative. Other: None IMPRESSION: 1. No acute intracranial pathology. 2. No acute/traumatic cervical spine pathology. Electronically Signed   By: Anner Crete M.D.    On: 04/03/2021 19:31   CT Cervical Spine Wo Contrast  Result Date: 04/03/2021 CLINICAL DATA:  60 year old female with headache. EXAM: CT HEAD WITHOUT CONTRAST CT CERVICAL SPINE WITHOUT CONTRAST TECHNIQUE: Multidetector CT imaging of the head and cervical spine was performed following the standard protocol without intravenous contrast. Multiplanar CT image reconstructions of the cervical spine were also generated. COMPARISON:  Brain MRI dated 02/20/2020 and head CT dated 02/19/2020 FINDINGS: CT HEAD FINDINGS Brain: The ventricles and sulci appropriate size for patient's age. The gray-white matter discrimination is preserved. There is no acute intracranial hemorrhage. No mass effect or midline shift no extra-axial fluid collection. Vascular: No hyperdense vessel or unexpected calcification. Skull: Normal. Negative for fracture or focal lesion. Sinuses/Orbits: No acute finding. Other: None CT CERVICAL SPINE FINDINGS Alignment: No acute subluxation. There is straightening of normal cervical lordosis which may be positional or due to muscle spasm. Skull base and vertebrae: No acute fracture Soft tissues and spinal canal: No prevertebral fluid or swelling. No visible canal hematoma. Disc levels: No acute findings. No significant degenerative changes. Upper chest: Negative. Other: None IMPRESSION: 1. No acute intracranial pathology. 2. No acute/traumatic cervical spine pathology. Electronically Signed   By: Anner Crete M.D.   On: 04/03/2021 19:31    Procedures Procedures   Medications Ordered in ED Medications  sodium chloride 0.9 % bolus 500 mL (0 mLs Intravenous Stopped 04/03/21 2039)  metoCLOPramide (REGLAN) injection 10 mg (10 mg Intravenous Given 04/03/21 1926)    ED Course  I have reviewed the triage vital signs and the nursing notes.  Pertinent labs & imaging results that were available during my care of the patient were reviewed by me and considered in my medical decision making (see chart  for  details).    MDM Rules/Calculators/A&P                         patient here for evaluation of headache, neck pain, vomiting, tingling to bilateral legs. She is non-toxic appearing on evaluation and neurologically intact. Imaging is negative for acute abnormality. No significant electrolyte abnormality. Presentation is not consistent with subarachnoid hemorrhage, CVA, epidural abscess, acute arterial occlusion. Following treatment of headache in the department patient is feeling improved and able to tolerate orals. Plan to discharge home with outpatient follow-up and return precautions   Final Clinical Impression(s) / ED Diagnoses Final diagnoses:  Bad headache    Rx / DC Orders ED Discharge Orders     None        Quintella Reichert, MD 04/03/21 2126

## 2021-06-13 ENCOUNTER — Encounter (HOSPITAL_BASED_OUTPATIENT_CLINIC_OR_DEPARTMENT_OTHER): Payer: Self-pay | Admitting: *Deleted

## 2021-06-13 ENCOUNTER — Other Ambulatory Visit: Payer: Self-pay

## 2021-06-13 ENCOUNTER — Emergency Department (HOSPITAL_BASED_OUTPATIENT_CLINIC_OR_DEPARTMENT_OTHER)
Admission: EM | Admit: 2021-06-13 | Discharge: 2021-06-14 | Disposition: A | Discharge status: Home / Self Care | Payer: Medicaid Other | Attending: Emergency Medicine | Admitting: Emergency Medicine

## 2021-06-13 DIAGNOSIS — J45909 Unspecified asthma, uncomplicated: Secondary | ICD-10-CM | POA: Insufficient documentation

## 2021-06-13 DIAGNOSIS — Z20822 Contact with and (suspected) exposure to covid-19: Secondary | ICD-10-CM | POA: Insufficient documentation

## 2021-06-13 DIAGNOSIS — Z8616 Personal history of COVID-19: Secondary | ICD-10-CM | POA: Insufficient documentation

## 2021-06-13 DIAGNOSIS — J449 Chronic obstructive pulmonary disease, unspecified: Secondary | ICD-10-CM | POA: Insufficient documentation

## 2021-06-13 DIAGNOSIS — R059 Cough, unspecified: Secondary | ICD-10-CM | POA: Diagnosis not present

## 2021-06-13 DIAGNOSIS — E119 Type 2 diabetes mellitus without complications: Secondary | ICD-10-CM | POA: Diagnosis not present

## 2021-06-13 DIAGNOSIS — I10 Essential (primary) hypertension: Secondary | ICD-10-CM | POA: Insufficient documentation

## 2021-06-13 DIAGNOSIS — M791 Myalgia, unspecified site: Secondary | ICD-10-CM | POA: Insufficient documentation

## 2021-06-13 DIAGNOSIS — Z79899 Other long term (current) drug therapy: Secondary | ICD-10-CM | POA: Insufficient documentation

## 2021-06-13 NOTE — ED Triage Notes (Signed)
C/o cough , SOB x 1 month  dx bronchitis x 3 days ago by PMD  and ED

## 2021-06-14 LAB — SARS CORONAVIRUS 2 (TAT 6-24 HRS): SARS Coronavirus 2: NEGATIVE

## 2021-06-14 MED ORDER — BENZONATATE 100 MG PO CAPS
200.0000 mg | ORAL_CAPSULE | Freq: Once | ORAL | Status: AC
  Administered 2021-06-14: 200 mg via ORAL
  Filled 2021-06-14: qty 2

## 2021-06-14 MED ORDER — KETOROLAC TROMETHAMINE 60 MG/2ML IM SOLN
60.0000 mg | Freq: Once | INTRAMUSCULAR | Status: AC
  Administered 2021-06-14: 60 mg via INTRAMUSCULAR
  Filled 2021-06-14: qty 2

## 2021-06-14 MED ORDER — BENZONATATE 100 MG PO CAPS: 100.0000 mg | ORAL_CAPSULE | Freq: Three times a day (TID) | ORAL | 0 refills | Status: DC

## 2021-06-14 MED ORDER — LIDOCAINE 5 % EX PTCH: 1.0000 | MEDICATED_PATCH | CUTANEOUS | 0 refills | Status: DC

## 2021-06-14 NOTE — ED Provider Notes (Signed)
[facility] EMERGENCY DEPARTMENT Provider Note   CSN: [id number] Arrival date & time: 06/13/21  2348     History Chief Complaint  Patient presents with   Cough    Susan Hogan is a 60 y.o. female.  The history is provided by the patient.  Cough Cough characteristics:  Non-productive Severity:  Moderate Onset quality:  Gradual Duration:  6 weeks Timing:  Sporadic Progression:  Unchanged Context: upper respiratory infection   Context: not exposure to allergens   Relieved by:  Nothing Worsened by:  Nothing Ineffective treatments:  None tried Associated symptoms: myalgias   Associated symptoms: no chills, no diaphoresis, no ear fullness, no ear pain, no eye discharge, no fever, no headaches, no rash, no rhinorrhea, no shortness of breath, no sinus congestion, no sore throat, no weight loss and no wheezing   Risk factors: no chemical exposure   On doxycycline and not improving.  Has body aches.      Past Medical History:  Diagnosis Date   Anemia    Arthritis    RA "states does not see Rheumatologist"   Asthma    Carpal tunnel syndrome    bilaterally   Complication of anesthesia    difficulty breathing   COPD (chronic obstructive pulmonary disease) (HCC)    COVID-19    Degenerative disk disease    back   Depression    Diabetes mellitus    Fibromyalgia    GERD (gastroesophageal reflux disease)    Headache(784.0)    migraines   Heart murmur    Hepatitis    In followup treatment for hepatitis C   Hypertension    no medication for at this time, sees Dr. Estevan Oaks Pcp   Hypotensive episode    hx of   Irritable bowel syndrome    Kidney stone    with hematuria   Memory loss    Recurrent upper respiratory infection (URI)    Shortness of breath    Syncope     Patient Active Problem List   Diagnosis Date Noted   UTI (urinary tract infection) 02/20/2020   TIA (transient ischemic attack) 02/19/2020   Sinus bradycardia 02/19/2020   Chronic  migraine without aura, with intractable migraine, so stated, with status migrainosus 03/01/2019   Medication overuse headache 03/01/2019   Seasonal allergic rhinitis due to pollen 03/25/2018   Adnexal cyst 03/25/2018   Morbid obesity (HCC) 03/25/2018   HTN (hypertension) 03/25/2018   Pure hypercholesterolemia 03/25/2018   Sleep-disordered breathing 03/25/2018   Vitamin D deficiency 03/25/2018   Bilateral lower extremity edema 03/25/2018   Acute meniscal tear of left knee 11/24/2016   Chronic hepatitis C without hepatic coma (HCC) 11/24/2016   Fatty liver 11/24/2016   H/O degenerative disc disease 11/24/2016   COPD (chronic obstructive pulmonary disease) (HCC) 10/22/2016   Diabetes mellitus (HCC) 07/25/2014   Fibromyalgia 07/25/2014   MDD (major depressive disorder), recurrent severe, without psychosis (HCC) 03/17/2013   Hepatitis C 08/02/2007   IBS (irritable bowel syndrome) 08/02/2007   Mild persistent asthma with acute exacerbation 09/21/1962    Past Surgical History:  Procedure Laterality Date   CHOLECYSTECTOMY  2019   DILATION AND CURETTAGE OF UTERUS     Left knee surgery     LIVER BIOPSY     TONSILLECTOMY     TUBAL LIGATION       OB History   No obstetric history on file.     Family History  Problem Relation Age of Onset  Heart disease Mother    Diabetes Mother    Asthma Mother    Anemia Mother    Cerebral aneurysm Mother        ruptured   Kidney failure Mother    Migraines Mother    Parkinson's disease Mother    Colon cancer Maternal Uncle    Heart disease Maternal Uncle    Heart disease Maternal Grandmother    Diabetes Maternal Grandmother    Asthma Maternal Grandmother    Heart Problems Maternal Grandmother    Heart Problems Other        through family   Diabetes Granddaughter    Diabetes Daughter     Social History   Tobacco Use   Smoking status: Never   Smokeless tobacco: Never  Vaping Use   Vaping Use: Never used  Substance Use Topics    Alcohol use: Never    Alcohol/week: 0.0 standard drinks   Drug use: Never    Home Medications Prior to Admission medications   Medication Sig Start Date End Date Taking? Authorizing Provider  acetic acid 2 % otic solution Place 4 drops into the left ear 4 (four) times daily. 10/30/19   McDonald, Mia A, PA-C  albuterol (VENTOLIN HFA) 108 (90 Base) MCG/ACT inhaler Inhale 1-2 puffs into the lungs every 6 (six) hours as needed for wheezing or shortness of breath. 11/14/19   Olalere, Cicero Duck A, MD  amLODipine (NORVASC) 10 MG tablet Take 1 tablet (10 mg total) by mouth daily. 10/10/19 02/19/20  O'NealCassie Freer, MD  atorvastatin (LIPITOR) 40 MG tablet Take 1 tablet (40 mg total) by mouth daily. 02/21/20 05/21/20  Darliss Cheney, MD  blood glucose meter kit and supplies KIT Dispense based on patient and insurance preference. Use up to four times daily as directed. (FOR ICD-9 250.00, 250.01). 03/25/18   Briscoe Deutscher, DO  cephALEXin (KEFLEX) 500 MG capsule Take 500 mg by mouth 3 (three) times daily. 02/14/20   [provider]  cetirizine (ZYRTEC) 10 MG tablet Take 10 mg by mouth daily as needed for allergies.  01/17/20   [provider]  fluticasone (FLONASE) 50 MCG/ACT nasal spray Place 1 spray into both nostrils daily. 12/19/19   [provider]  glucose blood (ACCU-CHEK ACTIVE STRIPS) test strip Use as instructed 04/15/16   Nona Dell, PA-C  hydrOXYzine (ATARAX/VISTARIL) 25 MG tablet Take 25-50 mg by mouth at bedtime as needed. 01/17/20   [provider]  lidocaine (LIDODERM) 5 % Place 1 patch onto the skin daily. Remove & Discard patch within 12 hours or as directed by MD 11/08/19   Randal Buba, Contessa Preuss, MD  metoprolol succinate (TOPROL-XL) 25 MG 24 hr tablet Take 25 mg by mouth as needed.  01/17/20   [provider]  pantoprazole (PROTONIX) 40 MG tablet Take 40 mg by mouth daily. 01/04/20   [provider]  prochlorperazine (COMPAZINE) 10 MG tablet  Take 10 mg by mouth every 6 (six) hours as needed for nausea or vomiting.  12/19/19   [provider]  rizatriptan (MAXALT-MLT) 10 MG disintegrating tablet Take 10 mg by mouth 2 (two) times daily as needed for migraine.  01/17/20   [provider]  umeclidinium-vilanterol (ANORO ELLIPTA) 62.5-25 MCG/INH AEPB Inhale 1 puff into the lungs daily. Patient taking differently: Inhale 1 puff into the lungs as needed (wheezing).  11/14/19   Laurin Coder, MD  dicyclomine (BENTYL) 20 MG tablet Take 1 tablet (20 mg total) by mouth 2 (two) times daily.  Patient not taking: Reported on 10/01/2019 08/19/19 10/01/19  Lorayne Bender, PA-C  diphenhydrAMINE (BENADRYL) 25 MG tablet Take 1 tablet (25 mg total) by mouth every 6 (six) hours as needed. Patient not taking: Reported on 10/01/2019 09/24/19 10/01/19  Petrucelli, Aldona Bar R, PA-C  escitalopram (LEXAPRO) 10 MG tablet TAKE 1 TABLET(10 MG) BY MOUTH DAILY Patient not taking: Reported on 07/05/2018 06/01/18 09/24/19  Briscoe Deutscher, DO  labetalol (NORMODYNE) 100 MG tablet Take 1 tablet (100 mg total) by mouth 2 (two) times daily. Patient not taking: Reported on 10/01/2019 08/30/19 10/01/19  Milton Ferguson, MD  topiramate (TOPAMAX) 100 MG tablet Take 1 tablet (100 mg total) by mouth at bedtime. Patient not taking: Reported on 10/01/2019 03/09/19 10/01/19  Melvenia Beam, MD    Allergies    Anesthetic ether [ether], Nubain [nalbuphine hcl], Penicillins, Lisinopril, Advair diskus [fluticasone-salmeterol], Lactose intolerance (gi), and Tizanidine  Review of Systems   Review of Systems  Constitutional:  Negative for chills, diaphoresis, fever and weight loss.  HENT:  Negative for ear pain, rhinorrhea and sore throat.   Eyes:  Negative for discharge.  Respiratory:  Positive for cough. Negative for shortness of breath and wheezing.   Gastrointestinal:  Negative for vomiting.  Genitourinary:  Negative for difficulty urinating.  Musculoskeletal:  Positive for  myalgias. Negative for neck pain and neck stiffness.  Skin:  Negative for rash.  Neurological:  Negative for headaches.  Psychiatric/Behavioral:  Negative for agitation.   All other systems reviewed and are negative.  Physical Exam Updated Vital Signs BP (!) 171/94   Pulse 75   Temp 99 F (37.2 C) (Oral)   Resp 16   Ht $R'4\' 11"'dz$  (1.499 m)   Wt 95.3 kg   LMP  (LMP Unknown)   SpO2 98%   BMI 42.41 kg/m   Physical Exam Vitals and nursing note reviewed.  Constitutional:      General: She is not in acute distress.    Appearance: Normal appearance.  HENT:     Head: Normocephalic and atraumatic.     Nose: Nose normal.  Eyes:     Conjunctiva/sclera: Conjunctivae normal.     Pupils: Pupils are equal, round, and reactive to light.  Cardiovascular:     Rate and Rhythm: Normal rate and regular rhythm.     Pulses: Normal pulses.     Heart sounds: Normal heart sounds.  Pulmonary:     Effort: Pulmonary effort is normal.     Breath sounds: Normal breath sounds.  Abdominal:     General: Abdomen is flat. Bowel sounds are normal.     Palpations: Abdomen is soft.     Tenderness: There is no abdominal tenderness. There is no guarding.  Musculoskeletal:        General: Normal range of motion.     Cervical back: Normal range of motion and neck supple.  Skin:    General: Skin is warm and dry.     Capillary Refill: Capillary refill takes less than 2 seconds.  Neurological:     General: No focal deficit present.     Mental Status: She is alert and oriented to person, place, and time.     Deep Tendon Reflexes: Reflexes normal.  Psychiatric:        Mood and Affect: Mood normal.        Behavior: Behavior normal.    ED Results / Procedures / Treatments   Labs (all labs ordered are listed, but only abnormal results are displayed) Labs  Reviewed  SARS CORONAVIRUS 2 (TAT 6-24 HRS)    EKG None  Radiology No results found.  Procedures Procedures   Medications Ordered in  ED Medications  ketorolac (TORADOL) injection 60 mg (has no administration in time range)  benzonatate (TESSALON) capsule 200 mg (has no administration in time range)    ED Course  I have reviewed the triage vital signs and the nursing notes.  Pertinent labs & imaging results that were available during my care of the patient were reviewed by me and considered in my medical decision making (see chart for details).   Tony Michna was evaluated in Emergency Department on 06/14/2021 for the symptoms described in the history of present illness. She was evaluated in the context of the global COVID-19 pandemic, which necessitated consideration that the patient might be at risk for infection with the SARS-CoV-2 virus that causes COVID-19. Institutional protocols and algorithms that pertain to the evaluation of patients at risk for COVID-19 are in a state of rapid change based on information released by regulatory bodies including the CDC and federal and state organizations. These policies and algorithms were followed during the patient's care in the ED.  Final Clinical Impression(s) / ED Diagnoses Final diagnoses:  None   Return for intractable cough, coughing up blood, fevers > 100.4 unrelieved by medication, shortness of breath, intractable vomiting, chest pain, shortness of breath, weakness, numbness, changes in speech, facial asymmetry, abdominal pain, passing out, Inability to tolerate liquids or food, cough, altered mental status or any concerns. No signs of systemic illness or infection. The patient is nontoxic-appearing on exam and vital signs are within normal limits. I have reviewed the triage vital signs and the nursing notes. Pertinent labs & imaging results that were available during my care of the patient were reviewed by me and considered in my medical decision making (see chart for details). After history, exam, and medical workup I feel the patient has been appropriately medically  screened and is safe for discharge home. Pertinent diagnoses were discussed with the patient. Patient was given return precautions. Rx / DC Orders ED Discharge Orders     None        Jacori Mulrooney, MD 06/14/21 0010

## 2021-08-31 ENCOUNTER — Other Ambulatory Visit: Payer: Self-pay

## 2021-08-31 ENCOUNTER — Encounter (HOSPITAL_BASED_OUTPATIENT_CLINIC_OR_DEPARTMENT_OTHER): Payer: Self-pay | Admitting: Emergency Medicine

## 2021-08-31 ENCOUNTER — Emergency Department (HOSPITAL_BASED_OUTPATIENT_CLINIC_OR_DEPARTMENT_OTHER): Payer: Medicaid Other

## 2021-08-31 ENCOUNTER — Emergency Department (HOSPITAL_BASED_OUTPATIENT_CLINIC_OR_DEPARTMENT_OTHER)
Admission: EM | Admit: 2021-08-31 | Discharge: 2021-08-31 | Disposition: A | Discharge status: Home / Self Care | Payer: Medicaid Other | Attending: Emergency Medicine | Admitting: Emergency Medicine

## 2021-08-31 DIAGNOSIS — E119 Type 2 diabetes mellitus without complications: Secondary | ICD-10-CM | POA: Diagnosis not present

## 2021-08-31 DIAGNOSIS — I10 Essential (primary) hypertension: Secondary | ICD-10-CM | POA: Insufficient documentation

## 2021-08-31 DIAGNOSIS — Y92410 Unspecified street and highway as the place of occurrence of the external cause: Secondary | ICD-10-CM | POA: Insufficient documentation

## 2021-08-31 DIAGNOSIS — Z7951 Long term (current) use of inhaled steroids: Secondary | ICD-10-CM | POA: Insufficient documentation

## 2021-08-31 DIAGNOSIS — R0789 Other chest pain: Secondary | ICD-10-CM | POA: Insufficient documentation

## 2021-08-31 DIAGNOSIS — R202 Paresthesia of skin: Secondary | ICD-10-CM | POA: Insufficient documentation

## 2021-08-31 DIAGNOSIS — M542 Cervicalgia: Secondary | ICD-10-CM | POA: Insufficient documentation

## 2021-08-31 DIAGNOSIS — R519 Headache, unspecified: Secondary | ICD-10-CM | POA: Insufficient documentation

## 2021-08-31 DIAGNOSIS — M545 Low back pain, unspecified: Secondary | ICD-10-CM | POA: Diagnosis not present

## 2021-08-31 DIAGNOSIS — J4531 Mild persistent asthma with (acute) exacerbation: Secondary | ICD-10-CM | POA: Diagnosis not present

## 2021-08-31 DIAGNOSIS — Z8616 Personal history of COVID-19: Secondary | ICD-10-CM | POA: Insufficient documentation

## 2021-08-31 DIAGNOSIS — Z79899 Other long term (current) drug therapy: Secondary | ICD-10-CM | POA: Insufficient documentation

## 2021-08-31 DIAGNOSIS — M7918 Myalgia, other site: Secondary | ICD-10-CM

## 2021-08-31 DIAGNOSIS — J449 Chronic obstructive pulmonary disease, unspecified: Secondary | ICD-10-CM | POA: Diagnosis not present

## 2021-08-31 MED ORDER — AMLODIPINE BESYLATE 5 MG PO TABS
10.0000 mg | ORAL_TABLET | Freq: Once | ORAL | Status: AC
  Administered 2021-08-31: 10 mg via ORAL
  Filled 2021-08-31: qty 2

## 2021-08-31 MED ORDER — ACETAMINOPHEN 325 MG PO TABS: 650.0000 mg | ORAL_TABLET | Freq: Four times a day (QID) | ORAL | 0 refills | Status: DC | PRN

## 2021-08-31 MED ORDER — METHOCARBAMOL 500 MG PO TABS: 500.0000 mg | ORAL_TABLET | Freq: Two times a day (BID) | ORAL | 0 refills | Status: AC

## 2021-08-31 MED ORDER — HYDROCODONE-ACETAMINOPHEN 5-325 MG PO TABS
1.0000 | ORAL_TABLET | Freq: Once | ORAL | Status: AC
  Administered 2021-08-31: 1 via ORAL
  Filled 2021-08-31: qty 1

## 2021-08-31 MED ORDER — LIDOCAINE 5 % EX PTCH: 1.0000 | MEDICATED_PATCH | Freq: Every day | CUTANEOUS | 0 refills | Status: DC | PRN

## 2021-08-31 MED ORDER — IBUPROFEN 600 MG PO TABS: 600.0000 mg | ORAL_TABLET | Freq: Four times a day (QID) | ORAL | 0 refills | Status: DC | PRN

## 2021-08-31 MED ORDER — LIDOCAINE 5 % EX PTCH
1.0000 | MEDICATED_PATCH | CUTANEOUS | Status: DC
  Administered 2021-08-31: 1 via TRANSDERMAL
  Filled 2021-08-31: qty 1
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
## 2021-09-22 ENCOUNTER — Emergency Department (HOSPITAL_BASED_OUTPATIENT_CLINIC_OR_DEPARTMENT_OTHER)
Admission: EM | Admit: 2021-09-22 | Discharge: 2021-09-23 | Disposition: A | Discharge status: Home / Self Care | Payer: Medicaid Other | Attending: Emergency Medicine | Admitting: Emergency Medicine

## 2021-09-22 ENCOUNTER — Other Ambulatory Visit: Payer: Self-pay

## 2021-09-22 ENCOUNTER — Emergency Department (HOSPITAL_BASED_OUTPATIENT_CLINIC_OR_DEPARTMENT_OTHER): Payer: Medicaid Other

## 2021-09-22 ENCOUNTER — Encounter (HOSPITAL_BASED_OUTPATIENT_CLINIC_OR_DEPARTMENT_OTHER): Payer: Self-pay | Admitting: Obstetrics and Gynecology

## 2021-09-22 DIAGNOSIS — K5900 Constipation, unspecified: Secondary | ICD-10-CM | POA: Insufficient documentation

## 2021-09-22 DIAGNOSIS — Z79899 Other long term (current) drug therapy: Secondary | ICD-10-CM | POA: Diagnosis not present

## 2021-09-22 DIAGNOSIS — K921 Melena: Secondary | ICD-10-CM | POA: Insufficient documentation

## 2021-09-22 DIAGNOSIS — Z7951 Long term (current) use of inhaled steroids: Secondary | ICD-10-CM | POA: Diagnosis not present

## 2021-09-22 DIAGNOSIS — R110 Nausea: Secondary | ICD-10-CM | POA: Diagnosis not present

## 2021-09-22 DIAGNOSIS — R1013 Epigastric pain: Secondary | ICD-10-CM | POA: Diagnosis not present

## 2021-09-22 LAB — URINALYSIS, ROUTINE W REFLEX MICROSCOPIC
Glucose, UA: NEGATIVE mg/dL
Nitrite: NEGATIVE
Protein, ur: >300 mg/dL — AB
Specific Gravity, Urine: 1.028 (ref 1.005–1.030)
WBC, UA: >50 WBC/hpf — ABNORMAL HIGH (ref 0–5)
pH: 5.5 (ref 5.0–8.0)

## 2021-09-22 LAB — COMPREHENSIVE METABOLIC PANEL
ALT: 122 U/L — ABNORMAL HIGH (ref 0–44)
AST: 114 U/L — ABNORMAL HIGH (ref 15–41)
Albumin: 3.8 g/dL (ref 3.5–5.0)
Alkaline Phosphatase: 74 U/L (ref 38–126)
Anion gap: 9 (ref 5–15)
BUN: 15 mg/dL (ref 8–23)
CO2: 29 mmol/L (ref 22–32)
Calcium: 8.9 mg/dL (ref 8.9–10.3)
Chloride: 103 mmol/L (ref 98–111)
Creatinine, Ser: 0.7 mg/dL (ref 0.44–1.00)
GFR, Estimated: >60 mL/min (ref >60)
Glucose, Bld: 125 mg/dL — ABNORMAL HIGH (ref 70–99)
Potassium: 3.7 mmol/L (ref 3.5–5.1)
Sodium: 141 mmol/L (ref 135–145)
Total Bilirubin: 0.8 mg/dL (ref 0.3–1.2)
Total Protein: 6.7 g/dL (ref 6.5–8.1)

## 2021-09-22 LAB — CBC
HCT: 41.7 % (ref 36.0–46.0)
Hemoglobin: 14.5 g/dL (ref 12.0–15.0)
MCH: 33 pg (ref 26.0–34.0)
MCHC: 34.8 g/dL (ref 30.0–36.0)
MCV: 95 fL (ref 80.0–100.0)
Platelets: 189 10*3/uL (ref 150–400)
RBC: 4.39 MIL/uL (ref 3.87–5.11)
RDW: 12.5 % (ref 11.5–15.5)
WBC: 12.8 10*3/uL — ABNORMAL HIGH (ref 4.0–10.5)
nRBC: 0 % (ref 0.0–0.2)

## 2021-09-22 LAB — OCCULT BLOOD X 1 CARD TO LAB, STOOL: Fecal Occult Bld: NEGATIVE

## 2021-09-22 LAB — LIPASE, BLOOD: Lipase: 60 U/L — ABNORMAL HIGH (ref 11–51)

## 2021-09-22 LAB — CBG MONITORING, ED: Glucose-Capillary: 118 mg/dL — ABNORMAL HIGH (ref 70–99)

## 2021-09-22 MED ORDER — FENTANYL CITRATE PF 50 MCG/ML IJ SOSY
100.0000 ug | PREFILLED_SYRINGE | Freq: Once | INTRAMUSCULAR | Status: AC
  Administered 2021-09-22: 100 ug via INTRAVENOUS
  Filled 2021-09-22: qty 2

## 2021-09-22 MED ORDER — SODIUM CHLORIDE 0.9 % IV SOLN: Freq: Once | INTRAVENOUS | Status: AC

## 2021-09-22 MED ORDER — ONDANSETRON HCL 4 MG/2ML IJ SOLN
4.0000 mg | Freq: Once | INTRAMUSCULAR | Status: AC
  Administered 2021-09-22: 4 mg via INTRAVENOUS
  Filled 2021-09-22: qty 2

## 2021-09-22 MED ORDER — IOHEXOL 300 MG/ML  SOLN
100.0000 mL | Freq: Once | INTRAMUSCULAR | Status: AC | PRN
  Administered 2021-09-23: 100 mL via INTRAVENOUS

## 2021-09-22 NOTE — ED Triage Notes (Signed)
Patient reports abdominal and side pain and reports she has not been able to go to the bathroom. Patient reports the pain started yesterday and has continued into today. Patient reports she had a BM today but it was black.

## 2021-09-23 MED ORDER — PANTOPRAZOLE SODIUM 40 MG PO TBEC: 40.0000 mg | DELAYED_RELEASE_TABLET | Freq: Every day | ORAL | 0 refills | Status: DC

## 2021-09-23 NOTE — ED Notes (Signed)
Pt tolerated PO fluid intake without difficulty. Denies N/V

## 2021-09-23 NOTE — ED Provider Notes (Signed)
[facility] EMERGENCY DEPT Provider Note   CSN: [id number] Arrival date & time: 09/22/21  1840     History  Chief Complaint  Patient presents with   Abdominal Pain    Susan Hogan is a 61 y.o. female.  The history is provided by the patient.  Abdominal Pain Pain location:  Epigastric Pain quality: cramping   Pain radiates to:  Does not radiate Pain severity:  Moderate Onset quality:  Gradual Duration:  1 day Timing:  Constant Progression:  Worsening Chronicity:  New Context: not alcohol use   Relieved by:  Nothing Worsened by:  Movement and palpation Associated symptoms: constipation, melena and nausea   Associated symptoms: no chest pain, no dysuria, no fever and no vomiting   Risk factors: no alcohol abuse, no aspirin use and no NSAID use   Patient reports epigastric abdominal pain over the past day.  No vomiting.  She reports constipation and only small amount of stool is come out that looks black.  She reports MiraLAX to help move her bowels  she does not abuse NSAIDs.  No alcohol abuse.  She is not on iron supplementation or Pepto-Bismol She is not on anticoagulation. No dysuria or urinary symptoms. The pain is in epigastric, but has some on her left side. She reports frequent UTIs and gets treated with antibiotics frequently, last dose about 2 months ago    Home Medications Prior to Admission medications   Medication Sig Start Date End Date Taking? Authorizing Provider  pantoprazole (PROTONIX) 40 MG tablet Take 1 tablet (40 mg total) by mouth daily. 09/23/21  Yes Ripley Fraise, MD  acetaminophen (TYLENOL) 325 MG tablet Take 2 tablets (650 mg total) by mouth every 6 (six) hours as needed. 08/31/21   Wynona Dove A, DO  acetic acid 2 % otic solution Place 4 drops into the left ear 4 (four) times daily. 10/30/19   McDonald, Mia A, PA-C  albuterol (VENTOLIN HFA) 108 (90 Base) MCG/ACT inhaler Inhale 1-2 puffs into the lungs every 6 (six) hours as needed  for wheezing or shortness of breath. 11/14/19   Olalere, Cicero Duck A, MD  amLODipine (NORVASC) 10 MG tablet Take 1 tablet (10 mg total) by mouth daily. 10/10/19 02/19/20  O'NealCassie Freer, MD  atorvastatin (LIPITOR) 40 MG tablet Take 1 tablet (40 mg total) by mouth daily. 02/21/20 05/21/20  Darliss Cheney, MD  benzonatate (TESSALON) 100 MG capsule Take 1 capsule (100 mg total) by mouth every 8 (eight) hours. 06/14/21   Palumbo, April, MD  blood glucose meter kit and supplies KIT Dispense based on patient and insurance preference. Use up to four times daily as directed. (FOR ICD-9 250.00, 250.01). 03/25/18   Briscoe Deutscher, DO  cephALEXin (KEFLEX) 500 MG capsule Take 500 mg by mouth 3 (three) times daily. 02/14/20   [provider]  cetirizine (ZYRTEC) 10 MG tablet Take 10 mg by mouth daily as needed for allergies.  01/17/20   [provider]  fluticasone (FLONASE) 50 MCG/ACT nasal spray Place 1 spray into both nostrils daily. 12/19/19   [provider]  glucose blood (ACCU-CHEK ACTIVE STRIPS) test strip Use as instructed 04/15/16   Nona Dell, PA-C  hydrOXYzine (ATARAX/VISTARIL) 25 MG tablet Take 25-50 mg by mouth at bedtime as needed. 01/17/20   [provider]  ibuprofen (ADVIL) 600 MG tablet Take 1 tablet (600 mg total) by mouth every 6 (six) hours as needed. 08/31/21   Wynona Dove A, DO  lidocaine (LIDODERM) 5 %  Place 1 patch onto the skin daily. Remove & Discard patch within 12 hours or as directed by MD 11/08/19   Palumbo, April, MD  lidocaine (LIDODERM) 5 % Place 1 patch onto the skin daily. Remove & Discard patch within 12 hours or as directed by MD 06/14/21   Randal Buba, April, MD  lidocaine (LIDODERM) 5 % Place 1 patch onto the skin daily as needed for up to 15 doses. Remove & Discard patch within 12 hours or as directed by MD 08/31/21   Wynona Dove A, DO  metoprolol succinate (TOPROL-XL) 25 MG 24 hr tablet Take 25 mg by mouth as needed.  01/17/20   [provider]  prochlorperazine (COMPAZINE) 10 MG tablet Take 10 mg by mouth every 6 (six) hours as needed for nausea or vomiting.  12/19/19   [provider]  rizatriptan (MAXALT-MLT) 10 MG disintegrating tablet Take 10 mg by mouth 2 (two) times daily as needed for migraine.  01/17/20   [provider]  umeclidinium-vilanterol (ANORO ELLIPTA) 62.5-25 MCG/INH AEPB Inhale 1 puff into the lungs daily. Patient taking differently: Inhale 1 puff into the lungs as needed (wheezing).  11/14/19   Laurin Coder, MD  dicyclomine (BENTYL) 20 MG tablet Take 1 tablet (20 mg total) by mouth 2 (two) times daily. Patient not taking: Reported on 10/01/2019 08/19/19 10/01/19  Lorayne Bender, PA-C  diphenhydrAMINE (BENADRYL) 25 MG tablet Take 1 tablet (25 mg total) by mouth every 6 (six) hours as needed. Patient not taking: Reported on 10/01/2019 09/24/19 10/01/19  Petrucelli, Aldona Bar R, PA-C  escitalopram (LEXAPRO) 10 MG tablet TAKE 1 TABLET(10 MG) BY MOUTH DAILY Patient not taking: Reported on 07/05/2018 06/01/18 09/24/19  Briscoe Deutscher, DO  labetalol (NORMODYNE) 100 MG tablet Take 1 tablet (100 mg total) by mouth 2 (two) times daily. Patient not taking: Reported on 10/01/2019 08/30/19 10/01/19  Milton Ferguson, MD  topiramate (TOPAMAX) 100 MG tablet Take 1 tablet (100 mg total) by mouth at bedtime. Patient not taking: Reported on 10/01/2019 03/09/19 10/01/19  Melvenia Beam, MD      Allergies    Anesthetic ether [ether], Nubain [nalbuphine hcl], Penicillins, Lisinopril, Advair diskus [fluticasone-salmeterol], Lactose intolerance (gi), and Tizanidine    Review of Systems   Review of Systems  Constitutional:  Negative for fever.  Cardiovascular:  Negative for chest pain.  Gastrointestinal:  Positive for abdominal pain, constipation, melena and nausea. Negative for vomiting.  Genitourinary:  Negative for dysuria.  All other systems reviewed and are negative.  Physical Exam Updated Vital Signs BP  134/75    Pulse 65    Temp 99 F (37.2 C)    Resp 19    Ht 1.499 m (_0 )    Wt 98 kg    LMP  (LMP Unknown)    SpO2 95%    BMI 43.63 kg/m  Physical Exam CONSTITUTIONAL: Well developed/well nourished HEAD: Normocephalic/atraumatic EYES: EOMI/PERRL ENMT: Mucous membranes moist NECK: supple no meningeal signs SPINE/BACK:entire spine nontender CV: S1/S2 noted, no murmurs/rubs/gallops noted LUNGS: Lungs are clear to auscultation bilaterally, no apparent distress ABDOMEN: soft, moderate epigastric noted, no rebound or guarding, bowel sounds noted throughout abdomen, obese Rectal-no blood or melena, no rectal mass.  Hemoccult negative.  Nurse Junie Panning present for exam GU:no cva tenderness NEURO: Pt is awake/alert/appropriate, moves all extremitiesx4.  No facial droop.   EXTREMITIES: pulses normal/equal, full ROM SKIN: warm, color normal PSYCH: no abnormalities of mood noted, alert and oriented to situation  ED Results /  Procedures / Treatments   Labs (all labs ordered are listed, but only abnormal results are displayed) Labs Reviewed  LIPASE, BLOOD - Abnormal; Notable for the following components:      Result Value   Lipase 60 (*)    All other components within normal limits  COMPREHENSIVE METABOLIC PANEL - Abnormal; Notable for the following components:   Glucose, Bld 125 (*)    AST 114 (*)    ALT 122 (*)    All other components within normal limits  CBC - Abnormal; Notable for the following components:   WBC 12.8 (*)    All other components within normal limits  URINALYSIS, ROUTINE W REFLEX MICROSCOPIC - Abnormal; Notable for the following components:   APPearance HAZY (*)    Hgb urine dipstick LARGE (*)    Bilirubin Urine SMALL (*)    Ketones, ur TRACE (*)    Protein, ur >300 (*)    Leukocytes,Ua LARGE (*)    WBC, UA >50 (*)    Bacteria, UA RARE (*)    All other components within normal limits  CBG MONITORING, ED - Abnormal; Notable for the following components:    Glucose-Capillary 118 (*)    All other components within normal limits  URINE CULTURE  OCCULT BLOOD X 1 CARD TO LAB, STOOL    EKG EKG Interpretation  Date/Time:  Monday September 22 2021 23:27:42 EST Ventricular Rate:  72 PR Interval:  160 QRS Duration: 98 QT Interval:  421 QTC Calculation: 461 R Axis:   15 Text Interpretation: Sinus rhythm Borderline T abnormalities, inferior leads No significant change since last tracing Confirmed by Ripley Fraise [phone]) on 09/22/2021 11:41:12 PM  Radiology CT ABDOMEN PELVIS W CONTRAST  Result Date: 09/23/2021 CLINICAL DATA:  Acute abdominal pain. EXAM: CT ABDOMEN AND PELVIS WITH CONTRAST TECHNIQUE: Multidetector CT imaging of the abdomen and pelvis was performed using the standard protocol following bolus administration of intravenous contrast. CONTRAST:  181m OMNIPAQUE IOHEXOL 300 MG/ML  SOLN COMPARISON:  CT 08/14/2019 FINDINGS: Lower chest: Subsegmental chronic atelectasis or scarring in the medial right lower lobe. Bilateral lower lobe bronchiolectasis. No acute airspace disease or pleural effusion. Hepatobiliary: No focal liver abnormality is seen. Status post cholecystectomy. Stable biliary prominence postcholecystectomy, likely normal postop. Pancreas: No ductal dilatation or inflammation. Spleen: Normal in size without focal abnormality. Adrenals/Urinary Tract: Normal adrenal glands. No hydronephrosis or perinephric edema. Homogeneous renal enhancement with symmetric excretion on delayed phase imaging. Subcentimeter low-density in the upper left kidney is too small to characterize but likely cyst. No suspicious renal abnormality. Urinary bladder is only minimally distended and not well assessed. Stomach/Bowel: Nondistended stomach. Occasional fluid-filled distal small bowel without wall thickening, inflammation or obstruction. No abnormal small bowel distension. The appendix is not well-defined on the current exam, however no evidence of appendicitis.  Occasional fluid/liquid stool in the colon. No colonic wall thickening or inflammatory change. No significant diverticular disease. Vascular/Lymphatic: Normal caliber abdominal aorta. Patent portal vein. No acute vascular findings. No enlarged lymph nodes in the abdomen or pelvis. Reproductive: 2.5 cm cyst in the left ovary, series 2, image 74. This measures simple fluid density and is simple appearing. The previous suspected internal fat density is not seen on the current exam. This cyst is unchanged in size. The uterus is anteverted. Right ovary is tentatively visualized and quiescent. Other: No free air, free fluid, or intra-abdominal fluid collection. Tiny fat containing umbilical hernia. Musculoskeletal: Lower lumbar degenerative change. There are no acute or suspicious osseous abnormalities.  IMPRESSION: 1. Occasional fluid-filled distal small bowel and colon, can be seen with diarrheal illness. No bowel obstruction or inflammation. 2. Unchanged 2.5 cm left ovarian cyst since 2020 CT. Simple appearing cyst, for a cyst of this size no follow-up imaging is recommended. Electronically Signed   By: Keith Rake M.D.   On: 09/23/2021 00:27    Procedures Procedures    Medications Ordered in ED Medications  fentaNYL (SUBLIMAZE) injection 100 mcg (100 mcg Intravenous Given 09/22/21 2343)  ondansetron (ZOFRAN) injection 4 mg (4 mg Intravenous Given 09/22/21 2342)  0.9 %  sodium chloride infusion (0 mLs Intravenous Stopped 09/23/21 0223)  iohexol (OMNIPAQUE) 300 MG/ML solution 100 mL (100 mLs Intravenous Contrast Given 09/23/21 0002)    ED Course/ Medical Decision Making/ A&P Clinical Course as of 09/23/21 0225  Tue Sep 23, 2021  0112 WBC(!): 12.8 Mild leukocytosis [DW]    Clinical Course User Index [DW] Ripley Fraise, MD                           Medical Decision Making  This patient presents to the ED for concern of abdominal pain, this involves an extensive number of treatment options, and is  a complaint that carries with it a high risk of complications and morbidity.  The differential diagnosis includes bowel obstruction, bowel perforation, gastritis, ulcer, pancreatitis, appendicitis, UTI, kidney stone  Comorbidities that complicate the patient evaluation: Patients presentation is complicated by their history of obesity   Additional history obtained:  Records reviewed Primary Care Documents  Lab Tests: I Ordered, and personally interpreted labs.  The pertinent results include: Mild leukocytosis, mild hematuria  Imaging Studies ordered: I ordered imaging studies including CT scan abdomen/pelvis   I independently visualized and interpreted imaging which showed no acute findings I agree with the radiologist interpretation  Cardiac Monitoring: The patient was maintained on a cardiac monitor.  I personally viewed and interpreted the cardiac monitor which showed an underlying rhythm of:  sinus rhythm  Medicines ordered and prescription drug management: I ordered medication including fentanyl IV for pain Reevaluation of the patient after these medicines showed that the patient    improved    Reevaluation: After the interventions noted above, I reevaluated the patient and found that they have :improved  Complexity of problems addressed: Patients presentation is most consistent with  acute, uncomplicated illness  Disposition: After consideration of the diagnostic results and the patients response to treatment,  I feel that the patent would benefit from discharge patient improved, taking p.o. fluids. .   Patient with epigastric abdominal pain.  Reported dark stool, but no signs of GI bleed Overall CT scan is unremarkable. Patient with some evidence of pyuria, but reports she gets this frequently, no current urinary symptoms, will defer antibiotics and send culture   Patient is stable for discharge.  We will place her back on a PPI she can follow-up with  PCP       Final Clinical Impression(s) / ED Diagnoses Final diagnoses:  Epigastric pain    Rx / DC Orders ED Discharge Orders          Ordered    pantoprazole (PROTONIX) 40 MG tablet  Daily        09/23/21 0217              Ripley Fraise, MD 09/23/21 [phone]

## 2021-09-24 LAB — URINE CULTURE

## 2021-12-12 ENCOUNTER — Other Ambulatory Visit: Payer: Self-pay

## 2021-12-12 ENCOUNTER — Emergency Department (HOSPITAL_COMMUNITY): Payer: Medicaid Other

## 2021-12-12 ENCOUNTER — Emergency Department (HOSPITAL_COMMUNITY)
Admission: EM | Admit: 2021-12-12 | Discharge: 2021-12-12 | Disposition: A | Discharge status: Home / Self Care | Payer: Medicaid Other | Attending: Emergency Medicine | Admitting: Emergency Medicine

## 2021-12-12 ENCOUNTER — Encounter (HOSPITAL_COMMUNITY): Payer: Self-pay | Admitting: Emergency Medicine

## 2021-12-12 DIAGNOSIS — Z7951 Long term (current) use of inhaled steroids: Secondary | ICD-10-CM | POA: Diagnosis not present

## 2021-12-12 DIAGNOSIS — R519 Headache, unspecified: Secondary | ICD-10-CM | POA: Insufficient documentation

## 2021-12-12 DIAGNOSIS — E860 Dehydration: Secondary | ICD-10-CM | POA: Diagnosis not present

## 2021-12-12 DIAGNOSIS — J4541 Moderate persistent asthma with (acute) exacerbation: Secondary | ICD-10-CM | POA: Insufficient documentation

## 2021-12-12 DIAGNOSIS — R0602 Shortness of breath: Secondary | ICD-10-CM | POA: Diagnosis present

## 2021-12-12 HISTORY — DX: Migraine, unspecified, not intractable, without status migrainosus: G43.909

## 2021-12-12 LAB — CBC
HCT: 37.6 % (ref 36.0–46.0)
Hemoglobin: 13.3 g/dL (ref 12.0–15.0)
MCH: 34.1 pg — ABNORMAL HIGH (ref 26.0–34.0)
MCHC: 35.4 g/dL (ref 30.0–36.0)
MCV: 96.4 fL (ref 80.0–100.0)
Platelets: 167 10*3/uL (ref 150–400)
RBC: 3.9 MIL/uL (ref 3.87–5.11)
RDW: 12.9 % (ref 11.5–15.5)
WBC: 7.8 10*3/uL (ref 4.0–10.5)
nRBC: 0 % (ref 0.0–0.2)

## 2021-12-12 LAB — BASIC METABOLIC PANEL
Anion gap: 8 (ref 5–15)
BUN: 16 mg/dL (ref 8–23)
CO2: 26 mmol/L (ref 22–32)
Calcium: 9 mg/dL (ref 8.9–10.3)
Chloride: 107 mmol/L (ref 98–111)
Creatinine, Ser: 0.77 mg/dL (ref 0.44–1.00)
GFR, Estimated: >60 mL/min (ref >60)
Glucose, Bld: 167 mg/dL — ABNORMAL HIGH (ref 70–99)
Potassium: 3.8 mmol/L (ref 3.5–5.1)
Sodium: 141 mmol/L (ref 135–145)

## 2021-12-12 LAB — TROPONIN I (HIGH SENSITIVITY)
Troponin I (High Sensitivity): 5 ng/L (ref <18)
Troponin I (High Sensitivity): 5 ng/L (ref <18)

## 2021-12-12 LAB — D-DIMER, QUANTITATIVE: D-Dimer, Quant: 0.31 ug/mL-FEU (ref 0.00–0.50)

## 2021-12-12 LAB — BRAIN NATRIURETIC PEPTIDE: B Natriuretic Peptide: 41.9 pg/mL (ref 0.0–100.0)

## 2021-12-12 MED ORDER — ALBUTEROL SULFATE HFA 108 (90 BASE) MCG/ACT IN AERS: 2.0000 | INHALATION_SPRAY | RESPIRATORY_TRACT | Status: DC | PRN

## 2021-12-12 MED ORDER — KETOROLAC TROMETHAMINE 30 MG/ML IJ SOLN
15.0000 mg | Freq: Once | INTRAMUSCULAR | Status: AC
  Administered 2021-12-12: 15 mg via INTRAVENOUS
  Filled 2021-12-12: qty 1

## 2021-12-12 MED ORDER — SODIUM CHLORIDE 0.9 % IV BOLUS
500.0000 mL | Freq: Once | INTRAVENOUS | Status: AC
Start: 2021-12-12 — End: 2021-12-12
  Administered 2021-12-12: 500 mL via INTRAVENOUS

## 2021-12-12 NOTE — ED Notes (Signed)
Lab called to add on BNP 

## 2021-12-12 NOTE — ED Provider Notes (Signed)
?[facility] ?Provider Note ? ? ?CSN: [id number] ?Arrival date & time: 12/12/21  0515 ? ?  ? ?History ? ?Chief Complaint  ?Patient presents with  ? Shortness of Breath  ? ? ?Susan Hogan is a 61 y.o. female. ? ?Patient presents to the emergency department for evaluation of generalized weakness and shortness of breath.  Symptoms present for several weeks.  Patient reports that she has had a poor appetite, no vomiting or diarrhea.  Patient does report a history of asthma, asthma symptoms have been worse over the last week or so.  No cough, fever.  Denies chest pain.  Shortness of breath worsens with moving around and exerting herself.  Patient also complaining of headache.  She reports a head injury several months ago and headaches have been somewhat chronic but are worsening.  She is concerned about this because her mother had a brain aneurysm. ? ? ?  ? ?Home Medications ?Prior to Admission medications   ?Medication Sig Start Date End Date Taking? Authorizing Provider  ?acetaminophen (TYLENOL) 325 MG tablet Take 2 tablets (650 mg total) by mouth every 6 (six) hours as needed. 08/31/21   Wynona Dove A, DO  ?acetic acid 2 % otic solution Place 4 drops into the left ear 4 (four) times daily. 10/30/19   McDonald, Mia A, PA-C  ?albuterol (VENTOLIN HFA) 108 (90 Base) MCG/ACT inhaler Inhale 1-2 puffs into the lungs every 6 (six) hours as needed for wheezing or shortness of breath. 11/14/19   Laurin Coder, MD  ?amLODipine (NORVASC) 10 MG tablet Take 1 tablet (10 mg total) by mouth daily. 10/10/19 02/19/20  O'NealCassie Freer, MD  ?atorvastatin (LIPITOR) 40 MG tablet Take 1 tablet (40 mg total) by mouth daily. 02/21/20 05/21/20  Darliss Cheney, MD  ?benzonatate (TESSALON) 100 MG capsule Take 1 capsule (100 mg total) by mouth every 8 (eight) hours. 06/14/21   Palumbo, April, MD  ?blood glucose meter kit and supplies KIT Dispense based on patient and insurance preference. Use up to four  times daily as directed. (FOR ICD-9 250.00, 250.01). 03/25/18   Briscoe Deutscher, DO  ?cephALEXin (KEFLEX) 500 MG capsule Take 500 mg by mouth 3 (three) times daily. 02/14/20   [provider]  ?cetirizine (ZYRTEC) 10 MG tablet Take 10 mg by mouth daily as needed for allergies.  01/17/20   [provider]  ?fluticasone (FLONASE) 50 MCG/ACT nasal spray Place 1 spray into both nostrils daily. 12/19/19   [provider]  ?glucose blood (ACCU-CHEK ACTIVE STRIPS) test strip Use as instructed 04/15/16   Nona Dell, PA-C  ?hydrOXYzine (ATARAX/VISTARIL) 25 MG tablet Take 25-50 mg by mouth at bedtime as needed. 01/17/20   [provider]  ?ibuprofen (ADVIL) 600 MG tablet Take 1 tablet (600 mg total) by mouth every 6 (six) hours as needed. 08/31/21   Jeanell Sparrow, DO  ?lidocaine (LIDODERM) 5 % Place 1 patch onto the skin daily. Remove & Discard patch within 12 hours or as directed by MD 11/08/19   Randal Buba, April, MD  ?lidocaine (LIDODERM) 5 % Place 1 patch onto the skin daily. Remove & Discard patch within 12 hours or as directed by MD 06/14/21   Randal Buba, April, MD  ?lidocaine (LIDODERM) 5 % Place 1 patch onto the skin daily as needed for up to 15 doses. Remove & Discard patch within 12 hours or as directed by MD 08/31/21   Wynona Dove A, DO  ?metoprolol succinate (TOPROL-XL) 25 MG  24 hr tablet Take 25 mg by mouth as needed.  01/17/20   [provider]  ?pantoprazole (PROTONIX) 40 MG tablet Take 1 tablet (40 mg total) by mouth daily. 09/23/21   Ripley Fraise, MD  ?prochlorperazine (COMPAZINE) 10 MG tablet Take 10 mg by mouth every 6 (six) hours as needed for nausea or vomiting.  12/19/19   [provider]  ?rizatriptan (MAXALT-MLT) 10 MG disintegrating tablet Take 10 mg by mouth 2 (two) times daily as needed for migraine.  01/17/20   [provider]  ?umeclidinium-vilanterol (ANORO ELLIPTA) 62.5-25 MCG/INH AEPB Inhale 1 puff into the lungs daily. ?Patient  taking differently: Inhale 1 puff into the lungs as needed (wheezing).  11/14/19   Laurin Coder, MD  ?dicyclomine (BENTYL) 20 MG tablet Take 1 tablet (20 mg total) by mouth 2 (two) times daily. ?Patient not taking: Reported on 10/01/2019 08/19/19 10/01/19  Lorayne Bender, PA-C  ?diphenhydrAMINE (BENADRYL) 25 MG tablet Take 1 tablet (25 mg total) by mouth every 6 (six) hours as needed. ?Patient not taking: Reported on 10/01/2019 09/24/19 10/01/19  Petrucelli, Aldona Bar R, PA-C  ?escitalopram (LEXAPRO) 10 MG tablet TAKE 1 TABLET(10 MG) BY MOUTH DAILY ?Patient not taking: Reported on 07/05/2018 06/01/18 09/24/19  Briscoe Deutscher, DO  ?labetalol (NORMODYNE) 100 MG tablet Take 1 tablet (100 mg total) by mouth 2 (two) times daily. ?Patient not taking: Reported on 10/01/2019 08/30/19 10/01/19  Milton Ferguson, MD  ?topiramate (TOPAMAX) 100 MG tablet Take 1 tablet (100 mg total) by mouth at bedtime. ?Patient not taking: Reported on 10/01/2019 03/09/19 10/01/19  Melvenia Beam, MD  ?   ? ?Allergies    ?Anesthetic ether [ether], Nubain [nalbuphine hcl], Penicillins, Lisinopril, Advair diskus [fluticasone-salmeterol], Lactose intolerance (gi), and Tizanidine   ? ?Review of Systems   ?Review of Systems  ?Constitutional:  Positive for fatigue.  ?Respiratory:  Positive for shortness of breath.   ?Neurological:  Positive for headaches.  ? ?Physical Exam ?Updated Vital Signs ?BP (!) 159/73   Pulse 66   Temp 98.2 ?F (36.8 ?C) (Oral)   Resp 11   Ht _0  (1.499 m)   Wt 97.5 kg   LMP  (LMP Unknown)   SpO2 99%   BMI 43.42 kg/m?  ?Physical Exam ?Vitals and nursing note reviewed.  ?Constitutional:   ?   General: She is not in acute distress. ?   Appearance: She is well-developed.  ?HENT:  ?   Head: Normocephalic and atraumatic.  ?   Mouth/Throat:  ?   Mouth: Mucous membranes are moist.  ?Eyes:  ?   General: Vision grossly intact. Gaze aligned appropriately.  ?   Extraocular Movements: Extraocular movements intact.  ?   Conjunctiva/sclera:  Conjunctivae normal.  ?Cardiovascular:  ?   Rate and Rhythm: Normal rate and regular rhythm.  ?   Pulses: Normal pulses.  ?   Heart sounds: Normal heart sounds, S1 normal and S2 normal. No murmur heard. ?  No friction rub. No gallop.  ?Pulmonary:  ?   Effort: Pulmonary effort is normal. No respiratory distress.  ?   Breath sounds: Decreased breath sounds present.  ?Abdominal:  ?   General: Bowel sounds are normal.  ?   Palpations: Abdomen is soft.  ?   Tenderness: There is no abdominal tenderness. There is no guarding or rebound.  ?   Hernia: No hernia is present.  ?Musculoskeletal:     ?   General: No swelling.  ?   Cervical back:  Full passive range of motion without pain, normal range of motion and neck supple. No spinous process tenderness or muscular tenderness. Normal range of motion.  ?   Right lower leg: No edema.  ?   Left lower leg: No edema.  ?Skin: ?   General: Skin is warm and dry.  ?   Capillary Refill: Capillary refill takes less than 2 seconds.  ?   Findings: No ecchymosis, erythema, rash or wound.  ?Neurological:  ?   General: No focal deficit present.  ?   Mental Status: She is alert and oriented to person, place, and time.  ?   GCS: GCS eye subscore is 4. GCS verbal subscore is 5. GCS motor subscore is 6.  ?   Cranial Nerves: Cranial nerves 2-12 are intact.  ?   Sensory: Sensation is intact.  ?   Motor: Motor function is intact.  ?   Coordination: Coordination is intact.  ?Psychiatric:     ?   Attention and Perception: Attention normal.     ?   Mood and Affect: Mood normal.     ?   Speech: Speech normal.     ?   Behavior: Behavior normal.  ? ? ?ED Results / Procedures / Treatments   ?Labs ?(all labs ordered are listed, but only abnormal results are displayed) ?Labs Reviewed  ?BASIC METABOLIC PANEL - Abnormal; Notable for the following components:  ?    Result Value  ? Glucose, Bld 167 (*)   ? All other components within normal limits  ?CBC - Abnormal; Notable for the following components:  ? MCH  34.1 (*)   ? All other components within normal limits  ?BRAIN NATRIURETIC PEPTIDE  ?D-DIMER, QUANTITATIVE  ?TROPONIN I (HIGH SENSITIVITY)  ?TROPONIN I (HIGH SENSITIVITY)  ? ? ?EKG ?EKG Interpretation ? ?Date/Time:  Fr

## 2021-12-12 NOTE — ED Notes (Signed)
Patient transported to CT 

## 2021-12-12 NOTE — ED Notes (Signed)
Pt ambulated to the bathroom independently.

## 2021-12-12 NOTE — ED Triage Notes (Signed)
BIB [facility] after pt called to report [facility] w/ exertion and speaking. Pt also reports HTN. EMS reports pt not feeling well x 1 month with decrease appetite and lethargy.  ? ?HX: TIA ( 2022), HTN, DM, Asthma  ?

## 2022-01-19 ENCOUNTER — Encounter (HOSPITAL_BASED_OUTPATIENT_CLINIC_OR_DEPARTMENT_OTHER): Payer: Self-pay

## 2022-01-19 ENCOUNTER — Other Ambulatory Visit: Payer: Self-pay

## 2022-01-19 ENCOUNTER — Emergency Department (HOSPITAL_BASED_OUTPATIENT_CLINIC_OR_DEPARTMENT_OTHER)
Admission: EM | Admit: 2022-01-19 | Discharge: 2022-01-20 | Disposition: A | Discharge status: Home / Self Care | Payer: Medicaid Other | Attending: Emergency Medicine | Admitting: Emergency Medicine

## 2022-01-19 ENCOUNTER — Emergency Department (HOSPITAL_BASED_OUTPATIENT_CLINIC_OR_DEPARTMENT_OTHER): Payer: Medicaid Other | Admitting: Radiology

## 2022-01-19 DIAGNOSIS — J441 Chronic obstructive pulmonary disease with (acute) exacerbation: Secondary | ICD-10-CM | POA: Insufficient documentation

## 2022-01-19 DIAGNOSIS — I10 Essential (primary) hypertension: Secondary | ICD-10-CM | POA: Diagnosis not present

## 2022-01-19 DIAGNOSIS — E119 Type 2 diabetes mellitus without complications: Secondary | ICD-10-CM | POA: Diagnosis not present

## 2022-01-19 DIAGNOSIS — J45909 Unspecified asthma, uncomplicated: Secondary | ICD-10-CM | POA: Insufficient documentation

## 2022-01-19 DIAGNOSIS — Z8616 Personal history of COVID-19: Secondary | ICD-10-CM | POA: Insufficient documentation

## 2022-01-19 DIAGNOSIS — Z7951 Long term (current) use of inhaled steroids: Secondary | ICD-10-CM | POA: Insufficient documentation

## 2022-01-19 DIAGNOSIS — Z79899 Other long term (current) drug therapy: Secondary | ICD-10-CM | POA: Diagnosis not present

## 2022-01-19 DIAGNOSIS — R0602 Shortness of breath: Secondary | ICD-10-CM | POA: Diagnosis present

## 2022-01-19 MED ORDER — IPRATROPIUM-ALBUTEROL 0.5-2.5 (3) MG/3ML IN SOLN
3.0000 mL | RESPIRATORY_TRACT | Status: DC
  Administered 2022-01-19: 3 mL via RESPIRATORY_TRACT
  Filled 2022-01-19: qty 3

## 2022-01-19 NOTE — ED Provider Notes (Signed)
? ?[facility] ?Provider Note: Georgena Spurling, MD, [place] ? ?CSN: [id number] ?MRN: [id number] ?ARRIVAL: 01/19/22 at 2225 ?ROOM: [id number] ? ? ?CHIEF COMPLAINT  ?Shortness of Breath ? ? ?HISTORY OF PRESENT ILLNESS  ?01/19/22 11:46 PM ?Susan Hogan is a 61 y.o. female with a history of COPD.  She has had shortness of breath for the past month that worsened after her son died recently.  This evening she was watching a movie and her shortness of breath acutely worsened.  It was severe and not adequately relieved with her albuterol inhaler.  She could not find her nebulizer machine.  Her symptoms have subsequently improved but are not back to normal.  She is not having chest pain with this.  She is having some right upper back muscle pain, worse with movement or palpation. ? ? ?Past Medical History:  ?Diagnosis Date  ? Anemia   ? Arthritis   ? RA "states does not see Rheumatologist"  ? Asthma   ? Carpal tunnel syndrome   ? bilaterally  ? Complication of anesthesia   ? difficulty breathing  ? COPD (chronic obstructive pulmonary disease) ([place])   ? COVID-19   ? Degenerative disk disease   ? back  ? Depression   ? Diabetes mellitus   ? Fibromyalgia   ? GERD (gastroesophageal reflux disease)   ? Headache(784.0)   ? migraines  ? Heart murmur   ? Hepatitis   ? In followup treatment for hepatitis C  ? Hypertension   ? no medication for at this time, sees Dr. Dinah Beers Pcp  ? Hypotensive episode   ? hx of  ? Irritable bowel syndrome   ? Kidney stone   ? with hematuria  ? Memory loss   ? Migraines   ? Recurrent upper respiratory infection (URI)   ? Shortness of breath   ? Syncope   ? ? ?Past Surgical History:  ?Procedure Laterality Date  ? CHOLECYSTECTOMY  2019  ? DILATION AND CURETTAGE OF UTERUS    ? Left knee surgery    ? LIVER BIOPSY    ? TONSILLECTOMY    ? TUBAL LIGATION    ? ? ?Family History  ?Problem Relation Age of Onset  ? Heart disease Mother   ? Diabetes Mother   ? Asthma Mother   ? Anemia Mother   ?  Cerebral aneurysm Mother   ?     ruptured  ? Kidney failure Mother   ? Migraines Mother   ? Parkinson's disease Mother   ? Colon cancer Maternal Uncle   ? Heart disease Maternal Uncle   ? Heart disease Maternal Grandmother   ? Diabetes Maternal Grandmother   ? Asthma Maternal Grandmother   ? Heart Problems Maternal Grandmother   ? Heart Problems Other   ?     through family  ? Diabetes Granddaughter   ? Diabetes Daughter   ? ? ?Social History  ? ?Tobacco Use  ? Smoking status: Never  ?  Passive exposure: Never  ? Smokeless tobacco: Never  ?Vaping Use  ? Vaping Use: Never used  ?Substance Use Topics  ? Alcohol use: Never  ?  Alcohol/week: 0.0 standard drinks  ? Drug use: Never  ? ? ?Prior to Admission medications   ?Medication Sig Start Date End Date Taking? Authorizing Provider  ?albuterol (VENTOLIN HFA) 108 (90 Base) MCG/ACT inhaler Inhale 1-2 puffs into the lungs every 6 (six) hours as needed for wheezing or shortness of breath. 11/14/19   Olalere,  Adewale A, MD  ?amLODipine (NORVASC) 10 MG tablet Take 1 tablet (10 mg total) by mouth daily. 10/10/19 02/19/20  O'NealCassie Freer, MD  ?atorvastatin (LIPITOR) 40 MG tablet Take 1 tablet (40 mg total) by mouth daily. 02/21/20 05/21/20  Darliss Cheney, MD  ?blood glucose meter kit and supplies KIT Dispense based on patient and insurance preference. Use up to four times daily as directed. (FOR ICD-9 250.00, 250.01). 03/25/18   Briscoe Deutscher, DO  ?cetirizine (ZYRTEC) 10 MG tablet Take 10 mg by mouth daily as needed for allergies.  01/17/20   [provider]  ?fluticasone (FLONASE) 50 MCG/ACT nasal spray Place 1 spray into both nostrils daily. 12/19/19   [provider]  ?glucose blood (ACCU-CHEK ACTIVE STRIPS) test strip Use as instructed 04/15/16   Nona Dell, PA-C  ?hydrOXYzine (ATARAX/VISTARIL) 25 MG tablet Take 25-50 mg by mouth at bedtime as needed. 01/17/20   [provider]  ?metoprolol succinate (TOPROL-XL) 25 MG 24 hr tablet Take  25 mg by mouth as needed.  01/17/20   [provider]  ?pantoprazole (PROTONIX) 40 MG tablet Take 1 tablet (40 mg total) by mouth daily. 09/23/21   Ripley Fraise, MD  ?prochlorperazine (COMPAZINE) 10 MG tablet Take 10 mg by mouth every 6 (six) hours as needed for nausea or vomiting.  12/19/19   [provider]  ?rizatriptan (MAXALT-MLT) 10 MG disintegrating tablet Take 10 mg by mouth 2 (two) times daily as needed for migraine.  01/17/20   [provider]  ?umeclidinium-vilanterol (ANORO ELLIPTA) 62.5-25 MCG/INH AEPB Inhale 1 puff into the lungs daily. ?Patient taking differently: Inhale 1 puff into the lungs as needed (wheezing).  11/14/19   Laurin Coder, MD  ?dicyclomine (BENTYL) 20 MG tablet Take 1 tablet (20 mg total) by mouth 2 (two) times daily. ?Patient not taking: Reported on 10/01/2019 08/19/19 10/01/19  Lorayne Bender, PA-C  ?diphenhydrAMINE (BENADRYL) 25 MG tablet Take 1 tablet (25 mg total) by mouth every 6 (six) hours as needed. ?Patient not taking: Reported on 10/01/2019 09/24/19 10/01/19  Petrucelli, Aldona Bar R, PA-C  ?escitalopram (LEXAPRO) 10 MG tablet TAKE 1 TABLET(10 MG) BY MOUTH DAILY ?Patient not taking: Reported on 07/05/2018 06/01/18 09/24/19  Briscoe Deutscher, DO  ?labetalol (NORMODYNE) 100 MG tablet Take 1 tablet (100 mg total) by mouth 2 (two) times daily. ?Patient not taking: Reported on 10/01/2019 08/30/19 10/01/19  Milton Ferguson, MD  ?topiramate (TOPAMAX) 100 MG tablet Take 1 tablet (100 mg total) by mouth at bedtime. ?Patient not taking: Reported on 10/01/2019 03/09/19 10/01/19  Melvenia Beam, MD  ? ? ?Allergies ?Anesthetic ether [ether], Nubain [nalbuphine hcl], Penicillins, Lisinopril, Advair diskus [fluticasone-salmeterol], Lactose intolerance (gi), and Tizanidine ? ? ?REVIEW OF SYSTEMS  ?Negative except as noted here or in the History of Present Illness. ? ? ?PHYSICAL EXAMINATION  ?Initial Vital Signs ?Blood pressure (!) 160/83, pulse 67, temperature 98.5 ?F (36.9 ?C),  resp. rate (!) 24, height 4' 11" (1.499 m), weight 98.4 kg, SpO2 94 %. ? ?Examination ?General: Well-developed, well-nourished female in no acute distress; appearance consistent with age of record ?HENT: normocephalic; atraumatic ?Eyes: Normal appearance ?Neck: supple ?Heart: regular rate and rhythm ?Lungs: Mild coarse sounds bilateral ?Abdomen: soft; nondistended; nontender; bowel sounds present ?Extremities: No deformity; full range of motion; pulses normal ?Neurologic: Awake, alert and oriented; motor function intact in all extremities and symmetric; no facial droop ?Skin: Warm and dry ?Psychiatric: Normal mood and affect ? ? ?RESULTS  ?Summary of this visit's results,  reviewed and interpreted by myself: ? ? EKG Interpretation ? ?Date/Time:  Monday Jan 19 2022 22:47:33 EDT ?Ventricular Rate:  74 ?PR Interval:  138 ?QRS Duration: 84 ?QT Interval:  384 ?QTC Calculation: 426 ?R Axis:   32 ?Text Interpretation: Normal sinus rhythm When compared with ECG of 12-Dec-2021 05:28, No significant change was found Confirmed by Shanon Rosser [phone]) on 01/19/2022 10:51:34 PM ?  ? ?  ? ?Laboratory Studies: ?No results found for this or any previous visit (from the past 24 hour(s)). ?Imaging Studies: ?DG Chest 2 View ? ?Result Date: 01/19/2022 ?CLINICAL DATA:  Shortness of breath. EXAM: CHEST - 2 VIEW COMPARISON:  Chest x-ray 04/03/2021 FINDINGS: The heart size and mediastinal contours are within normal limits. Both lungs are clear. The visualized skeletal structures are unremarkable. IMPRESSION: No active cardiopulmonary disease. Electronically Signed   By: Ronney Asters M.D.   On: 01/19/2022 23:11   ? ?ED COURSE and MDM  ?Nursing notes, initial and subsequent vitals signs, including pulse oximetry, reviewed and interpreted by myself. ? ?Vitals:  ? 01/19/22 2321 01/19/22 2326 01/19/22 2358 01/19/22 2359  ?BP: (!) 160/83   (!) 149/79  ?Pulse:  67  68  ?Resp:    20  ?Temp:      ?SpO2:  94% 98% 98%  ?Weight:      ?Height:       ? ?Medications  ?ipratropium-albuterol (DUONEB) 0.5-2.5 (3) MG/3ML nebulizer solution 3 mL (3 mLs Nebulization Given 01/19/22 2358)  ?aerochamber plus with mask device 1 each (1 each Other Given 01/20/22 0049)  ? ?12:

## 2022-01-19 NOTE — ED Triage Notes (Addendum)
Pt c/o ShOB x 1 month. Pt states that it became worse after her son died and then even worse after the funeral. Pt reports she feels anxious when the ShOB starts. Pt also reports feeling a lump in her throat. Pt also reports not being able to sleep.  ?

## 2022-01-19 NOTE — ED Notes (Signed)
Patient transported to X-ray 

## 2022-01-20 MED ORDER — AEROCHAMBER PLUS FLO-VU MISC
1.0000 | Freq: Once | Status: AC
  Administered 2022-01-20: 1
  Filled 2022-01-20: qty 1

## 2022-01-20 NOTE — ED Notes (Signed)
Patient provided with spacer for home use, per EDP. Education given and patient acknowledged understanding of direction. ?

## 2022-02-14 ENCOUNTER — Encounter (HOSPITAL_BASED_OUTPATIENT_CLINIC_OR_DEPARTMENT_OTHER): Payer: Self-pay

## 2022-02-14 ENCOUNTER — Other Ambulatory Visit: Payer: Self-pay

## 2022-02-14 ENCOUNTER — Telehealth (HOSPITAL_BASED_OUTPATIENT_CLINIC_OR_DEPARTMENT_OTHER): Payer: Self-pay | Admitting: Emergency Medicine

## 2022-02-14 ENCOUNTER — Emergency Department (HOSPITAL_BASED_OUTPATIENT_CLINIC_OR_DEPARTMENT_OTHER): Payer: Medicaid Other

## 2022-02-14 ENCOUNTER — Emergency Department (HOSPITAL_BASED_OUTPATIENT_CLINIC_OR_DEPARTMENT_OTHER)
Admission: EM | Admit: 2022-02-14 | Discharge: 2022-02-14 | Disposition: A | Discharge status: Home / Self Care | Payer: Medicaid Other | Attending: Emergency Medicine | Admitting: Emergency Medicine

## 2022-02-14 DIAGNOSIS — R0602 Shortness of breath: Secondary | ICD-10-CM | POA: Diagnosis not present

## 2022-02-14 DIAGNOSIS — R791 Abnormal coagulation profile: Secondary | ICD-10-CM | POA: Insufficient documentation

## 2022-02-14 DIAGNOSIS — R7401 Elevation of levels of liver transaminase levels: Secondary | ICD-10-CM | POA: Diagnosis not present

## 2022-02-14 DIAGNOSIS — M791 Myalgia, unspecified site: Secondary | ICD-10-CM | POA: Diagnosis not present

## 2022-02-14 DIAGNOSIS — Z20822 Contact with and (suspected) exposure to covid-19: Secondary | ICD-10-CM | POA: Insufficient documentation

## 2022-02-14 DIAGNOSIS — R1013 Epigastric pain: Secondary | ICD-10-CM | POA: Diagnosis present

## 2022-02-14 DIAGNOSIS — M546 Pain in thoracic spine: Secondary | ICD-10-CM | POA: Insufficient documentation

## 2022-02-14 DIAGNOSIS — R319 Hematuria, unspecified: Secondary | ICD-10-CM | POA: Insufficient documentation

## 2022-02-14 DIAGNOSIS — Z87442 Personal history of urinary calculi: Secondary | ICD-10-CM | POA: Insufficient documentation

## 2022-02-14 DIAGNOSIS — R112 Nausea with vomiting, unspecified: Secondary | ICD-10-CM | POA: Insufficient documentation

## 2022-02-14 LAB — CBC WITH DIFFERENTIAL/PLATELET
Abs Immature Granulocytes: 0.04 10*3/uL (ref 0.00–0.07)
Basophils Absolute: 0 10*3/uL (ref 0.0–0.1)
Basophils Relative: 0 %
Eosinophils Absolute: 0.1 10*3/uL (ref 0.0–0.5)
Eosinophils Relative: 1 %
HCT: 39.2 % (ref 36.0–46.0)
Hemoglobin: 13.8 g/dL (ref 12.0–15.0)
Immature Granulocytes: 0 %
Lymphocytes Relative: 20 %
Lymphs Abs: 2.4 10*3/uL (ref 0.7–4.0)
MCH: 32.9 pg (ref 26.0–34.0)
MCHC: 35.2 g/dL (ref 30.0–36.0)
MCV: 93.6 fL (ref 80.0–100.0)
Monocytes Absolute: 1.2 10*3/uL — ABNORMAL HIGH (ref 0.1–1.0)
Monocytes Relative: 10 %
Neutro Abs: 7.9 10*3/uL — ABNORMAL HIGH (ref 1.7–7.7)
Neutrophils Relative %: 69 %
Platelets: 185 10*3/uL (ref 150–400)
RBC: 4.19 MIL/uL (ref 3.87–5.11)
RDW: 12.1 % (ref 11.5–15.5)
WBC: 11.7 10*3/uL — ABNORMAL HIGH (ref 4.0–10.5)
nRBC: 0 % (ref 0.0–0.2)

## 2022-02-14 LAB — URINALYSIS, ROUTINE W REFLEX MICROSCOPIC
Bilirubin Urine: NEGATIVE
Glucose, UA: NEGATIVE mg/dL
Ketones, ur: NEGATIVE mg/dL
Leukocytes,Ua: NEGATIVE
Nitrite: NEGATIVE
Protein, ur: 100 mg/dL — AB
RBC / HPF: >50 RBC/hpf — ABNORMAL HIGH (ref 0–5)
Specific Gravity, Urine: 1.015 (ref 1.005–1.030)
pH: 5.5 (ref 5.0–8.0)

## 2022-02-14 LAB — COMPREHENSIVE METABOLIC PANEL
ALT: 85 U/L — ABNORMAL HIGH (ref 0–44)
AST: 79 U/L — ABNORMAL HIGH (ref 15–41)
Albumin: 3.5 g/dL (ref 3.5–5.0)
Alkaline Phosphatase: 74 U/L (ref 38–126)
Anion gap: 10 (ref 5–15)
BUN: 11 mg/dL (ref 8–23)
CO2: 27 mmol/L (ref 22–32)
Calcium: 9 mg/dL (ref 8.9–10.3)
Chloride: 103 mmol/L (ref 98–111)
Creatinine, Ser: 0.57 mg/dL (ref 0.44–1.00)
GFR, Estimated: >60 mL/min (ref >60)
Glucose, Bld: 113 mg/dL — ABNORMAL HIGH (ref 70–99)
Potassium: 3.7 mmol/L (ref 3.5–5.1)
Sodium: 140 mmol/L (ref 135–145)
Total Bilirubin: 0.6 mg/dL (ref 0.3–1.2)
Total Protein: 6.3 g/dL — ABNORMAL LOW (ref 6.5–8.1)

## 2022-02-14 LAB — SARS CORONAVIRUS 2 BY RT PCR: SARS Coronavirus 2 by RT PCR: NEGATIVE

## 2022-02-14 LAB — D-DIMER, QUANTITATIVE: D-Dimer, Quant: 1.11 ug/mL-FEU — ABNORMAL HIGH (ref 0.00–0.50)

## 2022-02-14 LAB — TROPONIN I (HIGH SENSITIVITY)
Troponin I (High Sensitivity): 4 ng/L (ref <18)
Troponin I (High Sensitivity): 5 ng/L (ref <18)

## 2022-02-14 LAB — CK: Total CK: 58 U/L (ref 38–234)

## 2022-02-14 MED ORDER — PANTOPRAZOLE SODIUM 40 MG IV SOLR
40.0000 mg | Freq: Once | INTRAVENOUS | Status: AC
  Administered 2022-02-14: 40 mg via INTRAVENOUS
  Filled 2022-02-14: qty 10

## 2022-02-14 MED ORDER — ONDANSETRON 8 MG PO TBDP: 8.0000 mg | ORAL_TABLET | Freq: Three times a day (TID) | ORAL | 0 refills | Status: DC | PRN

## 2022-02-14 MED ORDER — PANTOPRAZOLE SODIUM 40 MG PO TBEC: 40.0000 mg | DELAYED_RELEASE_TABLET | Freq: Every day | ORAL | 0 refills | Status: DC

## 2022-02-14 MED ORDER — HYDROCODONE-ACETAMINOPHEN 5-325 MG PO TABS: 1.0000 | ORAL_TABLET | Freq: Once | ORAL | Status: DC

## 2022-02-14 MED ORDER — ONDANSETRON HCL 4 MG/2ML IJ SOLN
4.0000 mg | Freq: Once | INTRAMUSCULAR | Status: AC
  Administered 2022-02-14: 4 mg via INTRAVENOUS
  Filled 2022-02-14: qty 2

## 2022-02-14 MED ORDER — ALUM & MAG HYDROXIDE-SIMETH 200-200-20 MG/5ML PO SUSP
30.0000 mL | Freq: Once | ORAL | Status: AC
  Administered 2022-02-14: 30 mL via ORAL
  Filled 2022-02-14: qty 30

## 2022-02-14 MED ORDER — SUCRALFATE 1 G PO TABS: 1.0000 g | ORAL_TABLET | Freq: Three times a day (TID) | ORAL | 0 refills | Status: DC

## 2022-02-14 MED ORDER — IOHEXOL 350 MG/ML SOLN
100.0000 mL | Freq: Once | INTRAVENOUS | Status: AC | PRN
  Administered 2022-02-14: 100 mL via INTRAVENOUS

## 2022-02-14 MED ORDER — NITROFURANTOIN MONOHYD MACRO 100 MG PO CAPS: 100.0000 mg | ORAL_CAPSULE | Freq: Two times a day (BID) | ORAL | 0 refills | Status: DC

## 2022-02-14 MED ORDER — HALOPERIDOL LACTATE 5 MG/ML IJ SOLN
2.5000 mg | Freq: Once | INTRAMUSCULAR | Status: AC
Start: 2022-02-14 — End: 2022-02-14
  Administered 2022-02-14: 2.5 mg via INTRAVENOUS
  Filled 2022-02-14: qty 1

## 2022-02-14 MED ORDER — HALOPERIDOL LACTATE 5 MG/ML IJ SOLN
5.0000 mg | Freq: Once | INTRAMUSCULAR | Status: DC
Start: 2022-02-14 — End: 2022-02-14

## 2022-02-14 MED ORDER — HYDROCODONE-ACETAMINOPHEN 5-325 MG PO TABS: 1.0000 | ORAL_TABLET | Freq: Once | ORAL | Status: DC | PRN

## 2022-02-14 NOTE — Telephone Encounter (Signed)
Patient is requesting for her prescriptions to be sent to the [facility] on [address].

## 2022-02-14 NOTE — ED Provider Notes (Signed)
[facility] Provider Note   CSN: [id number] Arrival date & time: 02/14/22  0701     History {Add pertinent medical, surgical, social history, OB history to HPI:1} Chief Complaint  Patient presents with   Generalized Body Aches    Susan Hogan is a 61 y.o. female.  HPI     61 year old female comes in with chief complaint of abdominal pain, generalized body aches, shortness of breath.  She indicates that over the past week she has been having pain over the epigastric abdominal region.  She is also having pain diffusely over her mid back, shoulders and neck area.  Patient has had some nausea with vomiting.  Shortness of breath is exertional.  She has no associated chest pain.  Patient has history of kidney stone and gallstones.  She thinks that the gallbladder was removed.  She denies any associated fevers.  Patient did indicate that she was suspecting food poisoning.  She lives with her daughter, unfortunately her son passed away in 01-26-23 and there is some litigation involved.  She is concerned that perhaps there could be some intentional food poisoning, but she has no evidence behind it.  Review of system is negative for chest pain, headaches, focal neurodeficits such as numbness, tingling, vision change and dizziness.  Home Medications Prior to Admission medications   Medication Sig Start Date End Date Taking? Authorizing Provider  albuterol (VENTOLIN HFA) 108 (90 Base) MCG/ACT inhaler Inhale 1-2 puffs into the lungs every 6 (six) hours as needed for wheezing or shortness of breath. 11/14/19   Olalere, Cicero Duck A, MD  amLODipine (NORVASC) 10 MG tablet Take 1 tablet (10 mg total) by mouth daily. 10/10/19 02/19/20  O'NealCassie Freer, MD  atorvastatin (LIPITOR) 40 MG tablet Take 1 tablet (40 mg total) by mouth daily. 02/21/20 05/21/20  Darliss Cheney, MD  blood glucose meter kit and supplies KIT Dispense based on patient and insurance preference. Use up to  four times daily as directed. (FOR ICD-9 250.00, 250.01). 03/25/18   Briscoe Deutscher, DO  cetirizine (ZYRTEC) 10 MG tablet Take 10 mg by mouth daily as needed for allergies.  01/17/20   [provider]  fluticasone (FLONASE) 50 MCG/ACT nasal spray Place 1 spray into both nostrils daily. 12/19/19   [provider]  glucose blood (ACCU-CHEK ACTIVE STRIPS) test strip Use as instructed 04/15/16   Nona Dell, PA-C  hydrOXYzine (ATARAX/VISTARIL) 25 MG tablet Take 25-50 mg by mouth at bedtime as needed. 01/17/20   [provider]  metoprolol succinate (TOPROL-XL) 25 MG 24 hr tablet Take 25 mg by mouth as needed.  01/17/20   [provider]  pantoprazole (PROTONIX) 40 MG tablet Take 1 tablet (40 mg total) by mouth daily. 09/23/21   Ripley Fraise, MD  prochlorperazine (COMPAZINE) 10 MG tablet Take 10 mg by mouth every 6 (six) hours as needed for nausea or vomiting.  12/19/19   [provider]  rizatriptan (MAXALT-MLT) 10 MG disintegrating tablet Take 10 mg by mouth 2 (two) times daily as needed for migraine.  01/17/20   [provider]  umeclidinium-vilanterol (ANORO ELLIPTA) 62.5-25 MCG/INH AEPB Inhale 1 puff into the lungs daily. Patient taking differently: Inhale 1 puff into the lungs as needed (wheezing).  11/14/19   Laurin Coder, MD  dicyclomine (BENTYL) 20 MG tablet Take 1 tablet (20 mg total) by mouth 2 (two) times daily. Patient not taking: Reported on 10/01/2019 08/19/19 10/01/19  Lorayne Bender, PA-C  diphenhydrAMINE (BENADRYL) 25  MG tablet Take 1 tablet (25 mg total) by mouth every 6 (six) hours as needed. Patient not taking: Reported on 10/01/2019 09/24/19 10/01/19  Petrucelli, Aldona Bar R, PA-C  escitalopram (LEXAPRO) 10 MG tablet TAKE 1 TABLET(10 MG) BY MOUTH DAILY Patient not taking: Reported on 07/05/2018 06/01/18 09/24/19  Briscoe Deutscher, DO  labetalol (NORMODYNE) 100 MG tablet Take 1 tablet (100 mg total) by mouth 2 (two) times  daily. Patient not taking: Reported on 10/01/2019 08/30/19 10/01/19  Milton Ferguson, MD  topiramate (TOPAMAX) 100 MG tablet Take 1 tablet (100 mg total) by mouth at bedtime. Patient not taking: Reported on 10/01/2019 03/09/19 10/01/19  Melvenia Beam, MD      Allergies    Anesthetic ether [ether], Nubain [nalbuphine hcl], Penicillins, Lisinopril, Advair diskus [fluticasone-salmeterol], Lactose intolerance (gi), and Tizanidine    Review of Systems   Review of Systems  All other systems reviewed and are negative.  Physical Exam Updated Vital Signs BP (!) 168/71   Pulse 74   Temp 99.1 F (37.3 C) (Oral)   Resp (!) 27   LMP  (LMP Unknown)   SpO2 93%  Physical Exam Vitals and nursing note reviewed.  Constitutional:      Appearance: She is well-developed.  HENT:     Head: Atraumatic.  Eyes:     Extraocular Movements: Extraocular movements intact.     Pupils: Pupils are equal, round, and reactive to light.     Comments: No visual field cuts  Cardiovascular:     Rate and Rhythm: Normal rate.  Pulmonary:     Effort: Pulmonary effort is normal.  Abdominal:     Palpations: Abdomen is soft.     Tenderness: There is abdominal tenderness. There is no guarding or rebound.     Comments: Epigastric abdominal tenderness  Musculoskeletal:     Cervical back: Normal range of motion and neck supple.  Skin:    General: Skin is warm and dry.  Neurological:     Mental Status: She is alert and oriented to person, place, and time.     Cranial Nerves: No cranial nerve deficit.     Sensory: No sensory deficit.     Motor: No weakness.     Coordination: Coordination normal.    ED Results / Procedures / Treatments   Labs (all labs ordered are listed, but only abnormal results are displayed) Labs Reviewed  COMPREHENSIVE METABOLIC PANEL - Abnormal; Notable for the following components:      Result Value   Glucose, Bld 113 (*)    Total Protein 6.3 (*)    AST 79 (*)    ALT 85 (*)    All other  components within normal limits  CBC WITH DIFFERENTIAL/PLATELET - Abnormal; Notable for the following components:   WBC 11.7 (*)    Neutro Abs 7.9 (*)    Monocytes Absolute 1.2 (*)    All other components within normal limits  D-DIMER, QUANTITATIVE - Abnormal; Notable for the following components:   D-Dimer, Quant 1.11 (*)    All other components within normal limits  URINALYSIS, ROUTINE W REFLEX MICROSCOPIC - Abnormal; Notable for the following components:   Hgb urine dipstick LARGE (*)    Protein, ur 100 (*)    RBC / HPF >50 (*)    All other components within normal limits  SARS CORONAVIRUS 2 BY RT PCR  CK  TROPONIN I (HIGH SENSITIVITY)  TROPONIN I (HIGH SENSITIVITY)    EKG EKG Interpretation  Date/Time:  Saturday  Feb 14 2022 08:06:58 EDT Ventricular Rate:  80 PR Interval:  142 QRS Duration: 94 QT Interval:  370 QTC Calculation: 427 R Axis:   47 Text Interpretation: Sinus rhythm No acute changes No significant change since last tracing No old tracing to compare Confirmed by Varney Biles [phone]) on 02/14/2022 10:20:09 AM  Radiology CT Angio Chest PE W and/or Wo Contrast  Result Date: 02/14/2022 CLINICAL DATA:  Shortness of breath for 1 week. Generalized body aches with hematuria. Back and shoulder pain. EXAM: CT ANGIOGRAPHY CHEST CT ABDOMEN AND PELVIS WITH CONTRAST TECHNIQUE: Multidetector CT imaging of the chest was performed using the standard protocol during bolus administration of intravenous contrast. Multiplanar CT image reconstructions and MIPs were obtained to evaluate the vascular anatomy. Multidetector CT imaging of the abdomen and pelvis was performed using the standard protocol during bolus administration of intravenous contrast. RADIATION DOSE REDUCTION: This exam was performed according to the departmental dose-optimization program which includes automated exposure control, adjustment of the mA and/or kV according to patient size and/or use of iterative  reconstruction technique. CONTRAST:  168m OMNIPAQUE IOHEXOL 350 MG/ML SOLN COMPARISON:  Abdomen/pelvis CT 09/23/2021.  CTA Chest 08/19/2019 FINDINGS: CTA CHEST FINDINGS Cardiovascular: The heart size is normal. No substantial pericardial effusion. No thoracic aortic aneurysm. No substantial atherosclerosis of the thoracic aorta. There is no filling defect within the opacified pulmonary arteries to suggest the presence of an acute pulmonary embolus. Mediastinum/Nodes: No mediastinal lymphadenopathy. There is no hilar lymphadenopathy. The esophagus has normal imaging features. There is no axillary lymphadenopathy. Lungs/Pleura: The lungs are clear without focal pneumonia, edema, pneumothorax or pleural effusion. Dependent atelectasis noted at the lung bases. No focal airspace consolidation. No pleural effusion. Musculoskeletal: No worrisome lytic or sclerotic osseous abnormality. Review of the MIP images confirms the above findings. CT ABDOMEN and PELVIS FINDINGS Hepatobiliary: No suspicious focal abnormality within the liver parenchyma. Gallbladder is surgically absent. No intrahepatic or extrahepatic biliary dilation. Pancreas: No focal mass lesion. No dilatation of the main duct. No intraparenchymal cyst. No peripancreatic edema. Spleen: No splenomegaly. No focal mass lesion. Adrenals/Urinary Tract: No adrenal nodule or mass. Right kidney unremarkable. 6 mm hypodensity upper pole left kidney is too small to characterize. No evidence for renal or ureteral stones. No evidence for hydroureter. The urinary bladder appears normal for the degree of distention. Stomach/Bowel: Stomach is unremarkable. No gastric wall thickening. No evidence of outlet obstruction. Duodenum is normally positioned as is the ligament of Treitz. No small bowel wall thickening. No small bowel dilatation. The terminal ileum is normal. The appendix is normal. No gross colonic mass. No colonic wall thickening. Vascular/Lymphatic: No abdominal  aortic aneurysm. No abdominal aortic atherosclerotic calcification. There is no gastrohepatic or hepatoduodenal ligament lymphadenopathy. No retroperitoneal or mesenteric lymphadenopathy. No pelvic sidewall lymphadenopathy. Reproductive: Uterus unremarkable.  There is no adnexal mass. Other: No intraperitoneal free fluid. Musculoskeletal: No worrisome lytic or sclerotic osseous abnormality. Review of the MIP images confirms the above findings. IMPRESSION: 1. No CT evidence of acute pulmonary embolus. 2. No acute process in the chest, abdomen, or pelvis. Specifically, no findings to explain the patient's history of body aches and hematuria. 3. Status post cholecystectomy. Electronically Signed   By: EMisty StanleyM.D.   On: 02/14/2022 10:03   CT ABDOMEN PELVIS W CONTRAST  Result Date: 02/14/2022 CLINICAL DATA:  Shortness of breath for 1 week. Generalized body aches with hematuria. Back and shoulder pain. EXAM: CT ANGIOGRAPHY CHEST CT ABDOMEN AND PELVIS WITH CONTRAST TECHNIQUE: Multidetector CT  imaging of the chest was performed using the standard protocol during bolus administration of intravenous contrast. Multiplanar CT image reconstructions and MIPs were obtained to evaluate the vascular anatomy. Multidetector CT imaging of the abdomen and pelvis was performed using the standard protocol during bolus administration of intravenous contrast. RADIATION DOSE REDUCTION: This exam was performed according to the departmental dose-optimization program which includes automated exposure control, adjustment of the mA and/or kV according to patient size and/or use of iterative reconstruction technique. CONTRAST:  125m OMNIPAQUE IOHEXOL 350 MG/ML SOLN COMPARISON:  Abdomen/pelvis CT 09/23/2021.  CTA Chest 08/19/2019 FINDINGS: CTA CHEST FINDINGS Cardiovascular: The heart size is normal. No substantial pericardial effusion. No thoracic aortic aneurysm. No substantial atherosclerosis of the thoracic aorta. There is no filling  defect within the opacified pulmonary arteries to suggest the presence of an acute pulmonary embolus. Mediastinum/Nodes: No mediastinal lymphadenopathy. There is no hilar lymphadenopathy. The esophagus has normal imaging features. There is no axillary lymphadenopathy. Lungs/Pleura: The lungs are clear without focal pneumonia, edema, pneumothorax or pleural effusion. Dependent atelectasis noted at the lung bases. No focal airspace consolidation. No pleural effusion. Musculoskeletal: No worrisome lytic or sclerotic osseous abnormality. Review of the MIP images confirms the above findings. CT ABDOMEN and PELVIS FINDINGS Hepatobiliary: No suspicious focal abnormality within the liver parenchyma. Gallbladder is surgically absent. No intrahepatic or extrahepatic biliary dilation. Pancreas: No focal mass lesion. No dilatation of the main duct. No intraparenchymal cyst. No peripancreatic edema. Spleen: No splenomegaly. No focal mass lesion. Adrenals/Urinary Tract: No adrenal nodule or mass. Right kidney unremarkable. 6 mm hypodensity upper pole left kidney is too small to characterize. No evidence for renal or ureteral stones. No evidence for hydroureter. The urinary bladder appears normal for the degree of distention. Stomach/Bowel: Stomach is unremarkable. No gastric wall thickening. No evidence of outlet obstruction. Duodenum is normally positioned as is the ligament of Treitz. No small bowel wall thickening. No small bowel dilatation. The terminal ileum is normal. The appendix is normal. No gross colonic mass. No colonic wall thickening. Vascular/Lymphatic: No abdominal aortic aneurysm. No abdominal aortic atherosclerotic calcification. There is no gastrohepatic or hepatoduodenal ligament lymphadenopathy. No retroperitoneal or mesenteric lymphadenopathy. No pelvic sidewall lymphadenopathy. Reproductive: Uterus unremarkable.  There is no adnexal mass. Other: No intraperitoneal free fluid. Musculoskeletal: No worrisome  lytic or sclerotic osseous abnormality. Review of the MIP images confirms the above findings. IMPRESSION: 1. No CT evidence of acute pulmonary embolus. 2. No acute process in the chest, abdomen, or pelvis. Specifically, no findings to explain the patient's history of body aches and hematuria. 3. Status post cholecystectomy. Electronically Signed   By: EMisty StanleyM.D.   On: 02/14/2022 10:03    Procedures Procedures  {Document cardiac monitor, telemetry assessment procedure when appropriate:1}  Medications Ordered in ED Medications  HYDROcodone-acetaminophen (NORCO/VICODIN) 5-325 MG per tablet 1 tablet (has no administration in time range)  HYDROcodone-acetaminophen (NORCO/VICODIN) 5-325 MG per tablet 1 tablet (has no administration in time range)  pantoprazole (PROTONIX) injection 40 mg (40 mg Intravenous Given 02/14/22 0815)  alum & mag hydroxide-simeth (MAALOX/MYLANTA) 200-200-20 MG/5ML suspension 30 mL (30 mLs Oral Given 02/14/22 0815)  ondansetron (ZOFRAN) injection 4 mg (4 mg Intravenous Given 02/14/22 0838)  iohexol (OMNIPAQUE) 350 MG/ML injection 100 mL (100 mLs Intravenous Contrast Given 02/14/22 0926)  haloperidol lactate (HALDOL) injection 2.5 mg (2.5 mg Intravenous Given 02/14/22 1027)    ED Course/ Medical Decision Making/ A&P Clinical Course as of 02/14/22 1106  Sat Feb 14, 2022  1017 CBC  with Differential(!) CBC with normal white count, normal hemoglobin. [AN]  1018 Comprehensive metabolic panel(!) Mildly elevated AST and ALT without any significant electrolyte disturbance or renal failure. [AN]  1018 D-Dimer, Quant(!): 1.11 D-dimer elevated, will proceed with CT angio chest for her shortness of breath [AN]  1018 RBC / HPF(!): >50 Hematuria noticed.  CT renal stone was initially ordered, but changed to CT abdomen pelvis with contrast after it was clear that patient will need to contrast for her chest anyways. [AN]  1105 Pt reassessed. Pt's VSS and WNL. Pt's cap refill < 3  seconds. Pt has been hydrated in the ER and now passed po challenge. We will discharge with antiemetic. Strict ER return precautions have been discussed and pt will return if he is unable to tolerate fluids and symptoms are getting worse.  [AN]    Clinical Course User Index [AN] Varney Biles, MD                           Medical Decision Making Amount and/or Complexity of Data Reviewed Labs: ordered. Decision-making details documented in ED Course. Radiology: ordered.  Risk OTC drugs. Prescription drug management.   This patient presents to the ED with multiple complaints, primarily shortness of breath with exertion, abdominal pain with nausea, vomiting and diarrhea and body aches with pertinent past medical history of cholecystectomy, renal stones which further complicates the presenting complaint. The complaint involves an extensive differential diagnosis and also carries with it a high risk of complications and morbidity.    The differential diagnosis includes: CHF, PE, ACS, kidney stones, intra-abdominal infection, gastroenteritis, severe electrolyte abnormality, renal failure, viral syndrome, COVID-19, gastritis, gastroparesis  The initial plan is to order basic labs including troponin and D-dimer.  We will also check urine analysis to make sure there is no infection or signs of kidney stone.   Additional history obtained: Records reviewed  previous CT scans of the abdomen and pelvis, which revealed no evidence of intra-abdominal acute process.  Patient is status postcholecystectomy. I have also reviewed patient's upper endoscopy results from few years back.  She had esophageal strictures.  Independent labs interpretation:  The following labs were independently interpreted: See work-up tab above   Independent visualization of imaging: - I independently visualized the following imaging with scope of interpretation limited to determining acute life threatening conditions  related to emergency care: CT scan of the chest, abdomen and pelvis, which revealed no clear evidence of pneumonia, pneumothorax, perforated viscus.  Radiologist interpretation is negative for PE or even kidney stones.  Treatment and Reassessment: Patient continues to have some discomfort.  Will order Haldol.  Second troponin pending at this time.  Results discussed with her.   Consideration for admission or further workup: Given patient's symptoms, admission was considered.  However, if the aggressive work-up in the ER including CT scan, symptom management, organ damage evaluation is reassuring and patient passes oral challenge, then we will be able to discharge her.   Final Clinical Impression(s) / ED Diagnoses Final diagnoses:  Hematuria, unspecified type  Epigastric abdominal pain  Nausea and vomiting, unspecified vomiting type    Rx / DC Orders ED Discharge Orders     None

## 2022-02-14 NOTE — Discharge Instructions (Signed)
We saw you in the ER for pain, nausea, vomiting. All the results in the ER are normal, labs and imaging.  We did see that you have slight blood in your urine.  Please take the antibiotics prescribed.  Please follow-up with your primary care doctor to ensure that the blood in the urine has cleared.  We are not sure what is causing your symptoms.  One of the thoughts is that you might have gastritis.  Please start taking the medications that are prescribed.  If your symptoms persist, discussed that with your primary care doctor to see if GI follow-up or additional work-up is indicated.  The workup in the ER is not complete, and is limited to screening for life threatening and emergent conditions only, so please see a primary care doctor for further evaluation.

## 2022-02-14 NOTE — ED Triage Notes (Signed)
She c/o "hurting" everywhere and some shortness of breath without cough x 1 week. She is ambulatory and in no distress.

## 2022-05-26 ENCOUNTER — Emergency Department (HOSPITAL_BASED_OUTPATIENT_CLINIC_OR_DEPARTMENT_OTHER): Payer: Medicaid Other | Admitting: Radiology

## 2022-05-26 ENCOUNTER — Emergency Department (HOSPITAL_BASED_OUTPATIENT_CLINIC_OR_DEPARTMENT_OTHER)
Admission: EM | Admit: 2022-05-26 | Discharge: 2022-05-26 | Disposition: A | Discharge status: Home / Self Care | Payer: Medicaid Other | Attending: Emergency Medicine | Admitting: Emergency Medicine

## 2022-05-26 ENCOUNTER — Other Ambulatory Visit: Payer: Self-pay

## 2022-05-26 DIAGNOSIS — J449 Chronic obstructive pulmonary disease, unspecified: Secondary | ICD-10-CM | POA: Insufficient documentation

## 2022-05-26 DIAGNOSIS — Z20822 Contact with and (suspected) exposure to covid-19: Secondary | ICD-10-CM | POA: Insufficient documentation

## 2022-05-26 DIAGNOSIS — Z7951 Long term (current) use of inhaled steroids: Secondary | ICD-10-CM | POA: Diagnosis not present

## 2022-05-26 DIAGNOSIS — I10 Essential (primary) hypertension: Secondary | ICD-10-CM | POA: Insufficient documentation

## 2022-05-26 DIAGNOSIS — Z8616 Personal history of COVID-19: Secondary | ICD-10-CM | POA: Diagnosis not present

## 2022-05-26 DIAGNOSIS — R0602 Shortness of breath: Secondary | ICD-10-CM | POA: Diagnosis present

## 2022-05-26 DIAGNOSIS — E119 Type 2 diabetes mellitus without complications: Secondary | ICD-10-CM | POA: Insufficient documentation

## 2022-05-26 DIAGNOSIS — I158 Other secondary hypertension: Secondary | ICD-10-CM | POA: Insufficient documentation

## 2022-05-26 DIAGNOSIS — Z79899 Other long term (current) drug therapy: Secondary | ICD-10-CM | POA: Diagnosis not present

## 2022-05-26 DIAGNOSIS — J4531 Mild persistent asthma with (acute) exacerbation: Secondary | ICD-10-CM | POA: Diagnosis not present

## 2022-05-26 LAB — CBC WITH DIFFERENTIAL/PLATELET
Abs Immature Granulocytes: 0.05 10*3/uL (ref 0.00–0.07)
Basophils Absolute: 0 10*3/uL (ref 0.0–0.1)
Basophils Relative: 0 %
Eosinophils Absolute: 0.2 10*3/uL (ref 0.0–0.5)
Eosinophils Relative: 2 %
HCT: 36.4 % (ref 36.0–46.0)
Hemoglobin: 13 g/dL (ref 12.0–15.0)
Immature Granulocytes: 1 %
Lymphocytes Relative: 30 %
Lymphs Abs: 2.9 10*3/uL (ref 0.7–4.0)
MCH: 33.2 pg (ref 26.0–34.0)
MCHC: 35.7 g/dL (ref 30.0–36.0)
MCV: 93.1 fL (ref 80.0–100.0)
Monocytes Absolute: 0.8 10*3/uL (ref 0.1–1.0)
Monocytes Relative: 8 %
Neutro Abs: 5.6 10*3/uL (ref 1.7–7.7)
Neutrophils Relative %: 59 %
Platelets: 161 10*3/uL (ref 150–400)
RBC: 3.91 MIL/uL (ref 3.87–5.11)
RDW: 13.3 % (ref 11.5–15.5)
WBC: 9.5 10*3/uL (ref 4.0–10.5)
nRBC: 0 % (ref 0.0–0.2)

## 2022-05-26 LAB — BASIC METABOLIC PANEL
Anion gap: 9 (ref 5–15)
BUN: 19 mg/dL (ref 8–23)
CO2: 27 mmol/L (ref 22–32)
Calcium: 8.6 mg/dL — ABNORMAL LOW (ref 8.9–10.3)
Chloride: 108 mmol/L (ref 98–111)
Creatinine, Ser: 0.8 mg/dL (ref 0.44–1.00)
GFR, Estimated: >60 mL/min (ref >60)
Glucose, Bld: 135 mg/dL — ABNORMAL HIGH (ref 70–99)
Potassium: 3.8 mmol/L (ref 3.5–5.1)
Sodium: 144 mmol/L (ref 135–145)

## 2022-05-26 LAB — BRAIN NATRIURETIC PEPTIDE: B Natriuretic Peptide: 40.1 pg/mL (ref 0.0–100.0)

## 2022-05-26 LAB — RESP PANEL BY RT-PCR (FLU A&B, COVID) ARPGX2
Influenza A by PCR: NEGATIVE
Influenza B by PCR: NEGATIVE
SARS Coronavirus 2 by RT PCR: NEGATIVE

## 2022-05-26 MED ORDER — OLMESARTAN MEDOXOMIL 20 MG PO TABS: 20.0000 mg | ORAL_TABLET | Freq: Every day | ORAL | 0 refills | Status: DC

## 2022-05-26 MED ORDER — METHYLPREDNISOLONE SODIUM SUCC 125 MG IJ SOLR
125.0000 mg | Freq: Once | INTRAMUSCULAR | Status: AC
  Administered 2022-05-26: 125 mg via INTRAVENOUS
  Filled 2022-05-26: qty 2

## 2022-05-26 MED ORDER — IPRATROPIUM-ALBUTEROL 0.5-2.5 (3) MG/3ML IN SOLN
3.0000 mL | Freq: Once | RESPIRATORY_TRACT | Status: AC
Start: 2022-05-26 — End: 2022-05-26
  Administered 2022-05-26: 3 mL via RESPIRATORY_TRACT
  Filled 2022-05-26: qty 3

## 2022-05-26 NOTE — ED Notes (Signed)
ED Provider at bedside. 

## 2022-05-26 NOTE — ED Notes (Signed)
Pt sts that she's having hot flashes and feels dizzy, fan given and pt reports some relief.

## 2022-05-26 NOTE — ED Provider Notes (Signed)
[place] EMERGENCY DEPT Provider Note  CSN: [id number] Arrival date & time: 05/26/22 0401  Chief Complaint(s) Shortness of Breath and Cough  HPI Susan Hogan is a 61 y.o. female     Shortness of Breath Severity:  Moderate Onset quality:  Gradual Duration:  1 month Timing:  Intermittent Progression:  Waxing and waning Chronicity:  Recurrent Context: URI   Relieved by:  Inhaler Worsened by:  Coughing Associated symptoms: cough   Associated symptoms: no chest pain, no fever, no hemoptysis, no sputum production and no wheezing   Cough Cough characteristics:  Non-productive Severity:  Moderate Onset quality:  Gradual Duration:  4 weeks Timing:  Intermittent Progression:  Waxing and waning Chronicity:  New Context: upper respiratory infection   Associated symptoms: shortness of breath   Associated symptoms: no chest pain, no fever and no wheezing    Patient reports that she was seen by her PCPs office 2 weeks ago and had a negative chest x-ray for pneumonia.  She was however prescribed doxycycline which is currently awaiting insurance approval.  She was also prescribed steroids but was instructed to only take the steroids after completing the doxycycline dose.  Past Medical History Past Medical History:  Diagnosis Date   Anemia    Arthritis    RA "states does not see Rheumatologist"   Asthma    Carpal tunnel syndrome    bilaterally   Complication of anesthesia    difficulty breathing   COPD (chronic obstructive pulmonary disease) (HCC)    COVID-19    Degenerative disk disease    back   Depression    Diabetes mellitus    Fibromyalgia    GERD (gastroesophageal reflux disease)    Headache(784.0)    migraines   Heart murmur    Hepatitis    In followup treatment for hepatitis C   Hypertension    no medication for at this time, sees Dr. Dinah Beers Pcp   Hypotensive episode    hx of   Irritable bowel syndrome    Kidney stone    with  hematuria   Memory loss    Migraines    Recurrent upper respiratory infection (URI)    Shortness of breath    Syncope    Patient Active Problem List   Diagnosis Date Noted   UTI (urinary tract infection) 02/20/2020   TIA (transient ischemic attack) 02/19/2020   Sinus bradycardia 02/19/2020   Chronic migraine without aura, with intractable migraine, so stated, with status migrainosus 03/01/2019   Medication overuse headache 03/01/2019   Seasonal allergic rhinitis due to pollen 03/25/2018   Adnexal cyst 03/25/2018   Morbid obesity ([place]) 03/25/2018   HTN (hypertension) 03/25/2018   Pure hypercholesterolemia 03/25/2018   Sleep-disordered breathing 03/25/2018   Vitamin D deficiency 03/25/2018   Bilateral lower extremity edema 03/25/2018   Acute meniscal tear of left knee 11/24/2016   Chronic hepatitis C without hepatic coma ([place]) 11/24/2016   Fatty liver 11/24/2016   H/O degenerative disc disease 11/24/2016   COPD (chronic obstructive pulmonary disease) ([place]) 10/22/2016   Diabetes mellitus ([place]) 07/25/2014   Fibromyalgia 07/25/2014   MDD (major depressive disorder), recurrent severe, without psychosis ([place]) 03/17/2013   Hepatitis C 08/02/2007   IBS (irritable bowel syndrome) 08/02/2007   Mild persistent asthma with acute exacerbation 09/21/1962   Home Medication(s) Prior to Admission medications   Medication Sig Start Date End Date Taking? Authorizing Provider  olmesartan (BENICAR) 20 MG tablet Take 1 tablet (20 mg total) by mouth daily.  05/26/22  Yes Dontrelle Mazon, Grayce Sessions, MD  albuterol (VENTOLIN HFA) 108 (90 Base) MCG/ACT inhaler Inhale 1-2 puffs into the lungs every 6 (six) hours as needed for wheezing or shortness of breath. 11/14/19   Olalere, Cicero Duck A, MD  amLODipine (NORVASC) 10 MG tablet Take 1 tablet (10 mg total) by mouth daily. 10/10/19 02/19/20  O'NealCassie Freer, MD  atorvastatin (LIPITOR) 40 MG tablet Take 1 tablet (40 mg total) by mouth daily. 02/21/20 05/21/20   Darliss Cheney, MD  blood glucose meter kit and supplies KIT Dispense based on patient and insurance preference. Use up to four times daily as directed. (FOR ICD-9 250.00, 250.01). 03/25/18   Briscoe Deutscher, DO  cetirizine (ZYRTEC) 10 MG tablet Take 10 mg by mouth daily as needed for allergies.  01/17/20   [provider]  fluticasone (FLONASE) 50 MCG/ACT nasal spray Place 1 spray into both nostrils daily. 12/19/19   [provider]  glucose blood (ACCU-CHEK ACTIVE STRIPS) test strip Use as instructed 04/15/16   Nona Dell, PA-C  hydrOXYzine (ATARAX/VISTARIL) 25 MG tablet Take 25-50 mg by mouth at bedtime as needed. 01/17/20   [provider]  metoprolol succinate (TOPROL-XL) 25 MG 24 hr tablet Take 25 mg by mouth as needed.  01/17/20   [provider]  nitrofurantoin, macrocrystal-monohydrate, (MACROBID) 100 MG capsule Take 1 capsule (100 mg total) by mouth 2 (two) times daily. 02/14/22   Sherwood Gambler, MD  ondansetron (ZOFRAN-ODT) 8 MG disintegrating tablet Take 1 tablet (8 mg total) by mouth every 8 (eight) hours as needed for nausea. 02/14/22   Sherwood Gambler, MD  pantoprazole (PROTONIX) 40 MG tablet Take 1 tablet (40 mg total) by mouth daily. 02/14/22   Sherwood Gambler, MD  prochlorperazine (COMPAZINE) 10 MG tablet Take 10 mg by mouth every 6 (six) hours as needed for nausea or vomiting.  12/19/19   [provider]  rizatriptan (MAXALT-MLT) 10 MG disintegrating tablet Take 10 mg by mouth 2 (two) times daily as needed for migraine.  01/17/20   [provider]  sucralfate (CARAFATE) 1 g tablet Take 1 tablet (1 g total) by mouth 4 (four) times daily -  with meals and at bedtime. 02/14/22   Sherwood Gambler, MD  umeclidinium-vilanterol (ANORO ELLIPTA) 62.5-25 MCG/INH AEPB Inhale 1 puff into the lungs daily. Patient taking differently: Inhale 1 puff into the lungs as needed (wheezing).  11/14/19   Laurin Coder, MD  dicyclomine (BENTYL) 20 MG  tablet Take 1 tablet (20 mg total) by mouth 2 (two) times daily. Patient not taking: Reported on 10/01/2019 08/19/19 10/01/19  Lorayne Bender, PA-C  diphenhydrAMINE (BENADRYL) 25 MG tablet Take 1 tablet (25 mg total) by mouth every 6 (six) hours as needed. Patient not taking: Reported on 10/01/2019 09/24/19 10/01/19  Petrucelli, Aldona Bar R, PA-C  escitalopram (LEXAPRO) 10 MG tablet TAKE 1 TABLET(10 MG) BY MOUTH DAILY Patient not taking: Reported on 07/05/2018 06/01/18 09/24/19  Briscoe Deutscher, DO  labetalol (NORMODYNE) 100 MG tablet Take 1 tablet (100 mg total) by mouth 2 (two) times daily. Patient not taking: Reported on 10/01/2019 08/30/19 10/01/19  Milton Ferguson, MD  topiramate (TOPAMAX) 100 MG tablet Take 1 tablet (100 mg total) by mouth at bedtime. Patient not taking: Reported on 10/01/2019 03/09/19 10/01/19  Melvenia Beam, MD  Allergies Anesthetic ether [ether], Nubain [nalbuphine hcl], Penicillins, Lisinopril, Advair diskus [fluticasone-salmeterol], Lactose intolerance (gi), and Tizanidine  Review of Systems Review of Systems  Constitutional:  Negative for fever.  Respiratory:  Positive for cough and shortness of breath. Negative for hemoptysis, sputum production and wheezing.   Cardiovascular:  Negative for chest pain.   As noted in HPI  Physical Exam Vital Signs  I have reviewed the triage vital signs BP (!) 178/76   Pulse 61   Temp 99 F (37.2 C) (Oral)   Resp (!) 12   Ht $R'4\' 11"'Gr$  (1.499 m)   LMP  (LMP Unknown)   SpO2 97%   BMI 43.83 kg/m   Physical Exam Vitals reviewed.  Constitutional:      General: She is not in acute distress.    Appearance: She is well-developed. She is not diaphoretic.  HENT:     Head: Normocephalic and atraumatic.     Nose: Nose normal.  Eyes:     General: No scleral icterus.       Right eye: No discharge.        Left eye:  No discharge.     Conjunctiva/sclera: Conjunctivae normal.     Pupils: Pupils are equal, round, and reactive to light.  Cardiovascular:     Rate and Rhythm: Normal rate and regular rhythm.     Heart sounds: No murmur heard.    No friction rub. No gallop.  Pulmonary:     Effort: Pulmonary effort is normal. No respiratory distress.     Breath sounds: Normal breath sounds. No stridor. No wheezing, rhonchi or rales.  Abdominal:     General: There is no distension.     Palpations: Abdomen is soft.     Tenderness: There is no abdominal tenderness.  Musculoskeletal:        General: No tenderness.     Cervical back: Normal range of motion and neck supple.     Right lower leg: No edema.     Left lower leg: No edema.  Skin:    General: Skin is warm and dry.     Findings: No erythema or rash.  Neurological:     Mental Status: She is alert and oriented to person, place, and time.     ED Results and Treatments Labs (all labs ordered are listed, but only abnormal results are displayed) Labs Reviewed  BASIC METABOLIC PANEL - Abnormal; Notable for the following components:      Result Value   Glucose, Bld 135 (*)    Calcium 8.6 (*)    All other components within normal limits  RESP PANEL BY RT-PCR (FLU A&B, COVID) ARPGX2  CBC WITH DIFFERENTIAL/PLATELET  BRAIN NATRIURETIC PEPTIDE                                                                                                                         EKG  EKG Interpretation  Date/Time:  Tuesday May 26 2022 04:12:07 EDT Ventricular Rate:  71 PR Interval:  157 QRS Duration: 94 QT Interval:  409 QTC Calculation: 445 R Axis:   13 Text Interpretation: Sinus rhythm Baseline wander in lead(s) I II III aVL aVF V2 V4 V5 V6 No significant change was found Confirmed by Addison Lank [phone]) on 05/26/2022 4:38:14 AM       Radiology DG Chest 2 View  Result Date: 05/26/2022 CLINICAL DATA:  Shortness of breath and cough. EXAM: CHEST - 2  VIEW COMPARISON:  01/19/2022 FINDINGS: The lungs are clear without focal pneumonia, edema, pneumothorax or pleural effusion. The cardiopericardial silhouette is within normal limits for size. The visualized bony structures of the thorax are unremarkable. Telemetry leads overlie the chest. IMPRESSION: No active cardiopulmonary disease. Electronically Signed   By: Misty Stanley M.D.   On: 05/26/2022 05:34    Medications Ordered in ED Medications  ipratropium-albuterol (DUONEB) 0.5-2.5 (3) MG/3ML nebulizer solution 3 mL (3 mLs Nebulization Given 05/26/22 0444)  methylPREDNISolone sodium succinate (SOLU-MEDROL) 125 mg/2 mL injection 125 mg (125 mg Intravenous Given 05/26/22 0434)                                                                                                                                     Procedures Procedures  (including critical care time)  Medical Decision Making / ED Course   Medical Decision Making Amount and/or Complexity of Data Reviewed External Data Reviewed: notes.    Details: PCP clinic note confirming above visit, assessment and prescriptions Labs: ordered. Decision-making details documented in ED Course. Radiology: ordered and independent interpretation performed. Decision-making details documented in ED Course. ECG/medicine tests: ordered and independent interpretation performed. Decision-making details documented in ED Course.  Risk Prescription drug management.   Cough and shortness of breath.  Likely asthma exacerbation.  Will assess for pneumonia.  Doubt pneumothorax but will be assessed with chest x-ray.   Patient is noted to be hypertensive.  States that she has not been taking her blood pressure medicine since April.  She is unsure which medicine she supposed to be taking.  On review of her PCP clinic notes it appears that patient should be taking 20 mg of Benicar daily.   Given the elevated blood pressure, will assess for new onset heart failure  however low suspicion for this.  We will also check for anemia.  CBC without leukocytosis or anemia Metabolic panel without significant electrolyte derangements or renal sufficiency. BNP negative COVID/influenza negative  Chest x-ray without evidence of pneumonia, pneumothorax, pulmonary edema or pleural effusions. EKG without acute ischemic changes, dysrhythmias or blocks.  Patient treated with single DuoNeb and IV steroids.  She reported significant relief following treatment.  Personally, I think we can hold off on antibiotics for now.  She was instructed to start taking her steroids for asthma exacerbation.  She was prescribed Benicar for blood pressure and instructed to follow-up closely with PCP.      Final Clinical Impression(s) /  ED Diagnoses Final diagnoses:  Other secondary hypertension  Mild persistent asthma with acute exacerbation   The patient appears reasonably screened and/or stabilized for discharge and I doubt any other medical condition or other [facility] requiring further screening, evaluation, or treatment in the ED at this time. I have discussed the findings, Dx and Tx plan with the patient/family who expressed understanding and agree(s) with the plan. Discharge instructions discussed at length. The patient/family was given strict return precautions who verbalized understanding of the instructions. No further questions at time of discharge.  Disposition: Discharge  Condition: Good  ED Discharge Orders          Ordered    olmesartan (BENICAR) 20 MG tablet  Daily        05/26/22 0612             Follow Up: Nicholes Rough, PA-C [place] [address] [phone]  Call  to schedule an appointment for close follow up           This chart was dictated using voice recognition software.  Despite best efforts to proofread,  errors can occur which can change the documentation meaning.    Fatima Blank, MD 05/26/22 [phone]

## 2022-05-26 NOTE — ED Notes (Addendum)
Late entry - Pt to and from Xray via w/c -- pt RR even and unlabored on RA with symmetrical rise and fall of chest.  No audible adventitious lungs sounds.  Pt reports difficulty swallowing however states swallowing is not painful. Pt reports h/o esophageal strictures with stretching however states it has been years since esophageal stretching required.  Continuous cardiac and pulse ox maintained.  Will monitor for acute changes and maintain plan of care as pt awaits test results.

## 2022-05-26 NOTE — Discharge Instructions (Signed)
For the rest of today and while awake, take 4 puffs of albuterol every 4 hours.  Starting tomorrow you can take 2 to 4 puffs every 4-6 hours as needed for shortness of breath or wheezing.   Tomorrow you can start taking your steroids for your asthma.  I have prescribed the blood pressure medication listed in your PCPs notes, Benicar.  Please start taking this today.

## 2022-05-26 NOTE — ED Triage Notes (Signed)
POV, pt sts that she's been seeing PCP for cough x 2 weeks, had clear CXR and breathing tx in office, tonight she started coughing and was having trouble breathing. Tried using rescue inhaler without relief. Pt alert and oriented x 4, ambulatory to room.   Hx of asthma and COPD with O2 dependency.

## 2022-06-09 ENCOUNTER — Other Ambulatory Visit: Payer: Self-pay

## 2022-06-09 ENCOUNTER — Emergency Department (HOSPITAL_COMMUNITY)
Admission: EM | Admit: 2022-06-09 | Discharge: 2022-06-09 | Disposition: A | Discharge status: Home / Self Care | Payer: Medicaid Other | Attending: Emergency Medicine | Admitting: Emergency Medicine

## 2022-06-09 ENCOUNTER — Emergency Department (HOSPITAL_COMMUNITY): Payer: Medicaid Other

## 2022-06-09 DIAGNOSIS — Z8616 Personal history of COVID-19: Secondary | ICD-10-CM | POA: Diagnosis not present

## 2022-06-09 DIAGNOSIS — I10 Essential (primary) hypertension: Secondary | ICD-10-CM | POA: Insufficient documentation

## 2022-06-09 DIAGNOSIS — Z7951 Long term (current) use of inhaled steroids: Secondary | ICD-10-CM | POA: Diagnosis not present

## 2022-06-09 DIAGNOSIS — E119 Type 2 diabetes mellitus without complications: Secondary | ICD-10-CM | POA: Diagnosis not present

## 2022-06-09 DIAGNOSIS — R61 Generalized hyperhidrosis: Secondary | ICD-10-CM | POA: Insufficient documentation

## 2022-06-09 DIAGNOSIS — R0600 Dyspnea, unspecified: Secondary | ICD-10-CM | POA: Insufficient documentation

## 2022-06-09 DIAGNOSIS — J449 Chronic obstructive pulmonary disease, unspecified: Secondary | ICD-10-CM | POA: Diagnosis not present

## 2022-06-09 DIAGNOSIS — J45909 Unspecified asthma, uncomplicated: Secondary | ICD-10-CM | POA: Insufficient documentation

## 2022-06-09 DIAGNOSIS — Z79899 Other long term (current) drug therapy: Secondary | ICD-10-CM | POA: Insufficient documentation

## 2022-06-09 LAB — CBC
HCT: 40.4 % (ref 36.0–46.0)
Hemoglobin: 14.2 g/dL (ref 12.0–15.0)
MCH: 33.6 pg (ref 26.0–34.0)
MCHC: 35.1 g/dL (ref 30.0–36.0)
MCV: 95.7 fL (ref 80.0–100.0)
Platelets: 176 10*3/uL (ref 150–400)
RBC: 4.22 MIL/uL (ref 3.87–5.11)
RDW: 13.2 % (ref 11.5–15.5)
WBC: 11.9 10*3/uL — ABNORMAL HIGH (ref 4.0–10.5)
nRBC: 0 % (ref 0.0–0.2)

## 2022-06-09 LAB — BASIC METABOLIC PANEL
Anion gap: 10 (ref 5–15)
BUN: 9 mg/dL (ref 8–23)
CO2: 25 mmol/L (ref 22–32)
Calcium: 10.1 mg/dL (ref 8.9–10.3)
Chloride: 106 mmol/L (ref 98–111)
Creatinine, Ser: 0.96 mg/dL (ref 0.44–1.00)
GFR, Estimated: >60 mL/min (ref >60)
Glucose, Bld: 143 mg/dL — ABNORMAL HIGH (ref 70–99)
Potassium: 3.7 mmol/L (ref 3.5–5.1)
Sodium: 141 mmol/L (ref 135–145)

## 2022-06-09 MED ORDER — IPRATROPIUM-ALBUTEROL 0.5-2.5 (3) MG/3ML IN SOLN
3.0000 mL | Freq: Once | RESPIRATORY_TRACT | Status: AC
  Administered 2022-06-09: 3 mL via RESPIRATORY_TRACT
  Filled 2022-06-09: qty 3

## 2022-06-09 MED ORDER — ONDANSETRON HCL 4 MG/2ML IJ SOLN
4.0000 mg | Freq: Once | INTRAMUSCULAR | Status: AC
  Administered 2022-06-09: 4 mg via INTRAVENOUS
  Filled 2022-06-09: qty 2

## 2022-06-09 MED ORDER — MAGNESIUM SULFATE 2 GM/50ML IV SOLN
2.0000 g | Freq: Once | INTRAVENOUS | Status: AC
  Administered 2022-06-09: 2 g via INTRAVENOUS
  Filled 2022-06-09: qty 50

## 2022-06-09 NOTE — ED Provider Notes (Addendum)
[facility] EMERGENCY DEPARTMENT Provider Note   CSN: [id number] Arrival date & time: 06/09/22  1102     History Past Medical History:  Diagnosis Date   Anemia    Arthritis    RA "states does not see Rheumatologist"   Asthma    Carpal tunnel syndrome    bilaterally   Complication of anesthesia    difficulty breathing   COPD (chronic obstructive pulmonary disease) (HCC)    COVID-19    Degenerative disk disease    back   Depression    Diabetes mellitus    Fibromyalgia    GERD (gastroesophageal reflux disease)    Headache(784.0)    migraines   Heart murmur    Hepatitis    In followup treatment for hepatitis C   Hypertension    no medication for at this time, sees Dr. Dinah Beers Pcp   Hypotensive episode    hx of   Irritable bowel syndrome    Kidney stone    with hematuria   Memory loss    Migraines    Recurrent upper respiratory infection (URI)    Shortness of breath    Syncope     Chief Complaint  Patient presents with   Hypertension   Shortness of Breath   Leg Pain   Headache    Susan Hogan is a 61 y.o. female.  61 yo F with poorly controlled hypertension, ?COPD/Asthma (no documented PFTs), T2DM, MDD, and fibromyalgia.   She states that about one week ago she developed some worsening shortness of breath. This has been an ongoing and chronic issue for her and she notes she has had multiple work ups that have been unrevealing including a recent normal TTE and normal CTA chest.   However, this AM she states that she had some diaphoresis in addition to her dyspnea. She checked her blood pressure and noted that it was in the 671 systolics x2 and the 245Y diastolics x2. Because of her symptoms and elevated blood pressure she decided to present to the ED.   She states that she also had some chest discomfort which is positional as well as some back discomfort. She had multiple chronic complaints of leg tingling and R focal flank pain,  and intermittent dysphagia. She also notes leg swelling and nausea.   She denies fevers, chills, cough, blurry vision, dysuria, constipation/diarrhea.    Hypertension Associated symptoms include headaches and shortness of breath.  Shortness of Breath Associated symptoms: headaches   Leg Pain Headache      Home Medications Prior to Admission medications   Medication Sig Start Date End Date Taking? Authorizing Provider  albuterol (VENTOLIN HFA) 108 (90 Base) MCG/ACT inhaler Inhale 1-2 puffs into the lungs every 6 (six) hours as needed for wheezing or shortness of breath. 11/14/19   Olalere, Cicero Duck A, MD  amLODipine (NORVASC) 10 MG tablet Take 1 tablet (10 mg total) by mouth daily. 10/10/19 02/19/20  O'NealCassie Freer, MD  atorvastatin (LIPITOR) 40 MG tablet Take 1 tablet (40 mg total) by mouth daily. 02/21/20 05/21/20  Darliss Cheney, MD  blood glucose meter kit and supplies KIT Dispense based on patient and insurance preference. Use up to four times daily as directed. (FOR ICD-9 250.00, 250.01). 03/25/18   Briscoe Deutscher, DO  cetirizine (ZYRTEC) 10 MG tablet Take 10 mg by mouth daily as needed for allergies.  01/17/20   [provider]  fluticasone (FLONASE) 50 MCG/ACT nasal spray Place 1 spray into both nostrils  daily. 12/19/19   [provider]  glucose blood (ACCU-CHEK ACTIVE STRIPS) test strip Use as instructed 04/15/16   Nona Dell, PA-C  hydrOXYzine (ATARAX/VISTARIL) 25 MG tablet Take 25-50 mg by mouth at bedtime as needed. 01/17/20   [provider]  metoprolol succinate (TOPROL-XL) 25 MG 24 hr tablet Take 25 mg by mouth as needed.  01/17/20   [provider]  nitrofurantoin, macrocrystal-monohydrate, (MACROBID) 100 MG capsule Take 1 capsule (100 mg total) by mouth 2 (two) times daily. 02/14/22   Sherwood Gambler, MD  olmesartan (BENICAR) 20 MG tablet Take 1 tablet (20 mg total) by mouth daily. 05/26/22   Fatima Blank, MD  ondansetron  (ZOFRAN-ODT) 8 MG disintegrating tablet Take 1 tablet (8 mg total) by mouth every 8 (eight) hours as needed for nausea. 02/14/22   Sherwood Gambler, MD  pantoprazole (PROTONIX) 40 MG tablet Take 1 tablet (40 mg total) by mouth daily. 02/14/22   Sherwood Gambler, MD  prochlorperazine (COMPAZINE) 10 MG tablet Take 10 mg by mouth every 6 (six) hours as needed for nausea or vomiting.  12/19/19   [provider]  rizatriptan (MAXALT-MLT) 10 MG disintegrating tablet Take 10 mg by mouth 2 (two) times daily as needed for migraine.  01/17/20   [provider]  sucralfate (CARAFATE) 1 g tablet Take 1 tablet (1 g total) by mouth 4 (four) times daily -  with meals and at bedtime. 02/14/22   Sherwood Gambler, MD  umeclidinium-vilanterol (ANORO ELLIPTA) 62.5-25 MCG/INH AEPB Inhale 1 puff into the lungs daily. Patient taking differently: Inhale 1 puff into the lungs as needed (wheezing).  11/14/19   Laurin Coder, MD  dicyclomine (BENTYL) 20 MG tablet Take 1 tablet (20 mg total) by mouth 2 (two) times daily. Patient not taking: Reported on 10/01/2019 08/19/19 10/01/19  Lorayne Bender, PA-C  diphenhydrAMINE (BENADRYL) 25 MG tablet Take 1 tablet (25 mg total) by mouth every 6 (six) hours as needed. Patient not taking: Reported on 10/01/2019 09/24/19 10/01/19  Petrucelli, Aldona Bar R, PA-C  escitalopram (LEXAPRO) 10 MG tablet TAKE 1 TABLET(10 MG) BY MOUTH DAILY Patient not taking: Reported on 07/05/2018 06/01/18 09/24/19  Briscoe Deutscher, DO  labetalol (NORMODYNE) 100 MG tablet Take 1 tablet (100 mg total) by mouth 2 (two) times daily. Patient not taking: Reported on 10/01/2019 08/30/19 10/01/19  Milton Ferguson, MD  topiramate (TOPAMAX) 100 MG tablet Take 1 tablet (100 mg total) by mouth at bedtime. Patient not taking: Reported on 10/01/2019 03/09/19 10/01/19  Melvenia Beam, MD      Allergies    Anesthetic ether [ether], Nubain [nalbuphine hcl], Penicillins, Lisinopril, Advair diskus [fluticasone-salmeterol],  Lactose intolerance (gi), and Tizanidine    Review of Systems   Review of Systems  Respiratory:  Positive for shortness of breath.   Neurological:  Positive for headaches.  All other systems reviewed and are negative.   Physical Exam Updated Vital Signs BP (!) 150/82   Pulse 64   Temp 97.7 F (36.5 C) (Oral)   Resp (!) 24   Ht $R'4\' 11"'eN$  (1.499 m)   Wt 93.9 kg   LMP  (LMP Unknown)   SpO2 93%   BMI 41.81 kg/m  Physical Exam Constitutional:      General: She is not in acute distress.    Appearance: She is well-developed. She is not ill-appearing.  HENT:     Head: Normocephalic.  Cardiovascular:     Rate and Rhythm: Normal rate and regular rhythm.  Pulmonary:  Effort: Pulmonary effort is normal.     Breath sounds: Normal breath sounds. No decreased breath sounds, wheezing or rales.  Chest:     Chest wall: Tenderness present.     Comments: Center of chest  Abdominal:     Palpations: Abdomen is soft.     Tenderness: There is no abdominal tenderness.  Musculoskeletal:     Right lower leg: No edema.     Left lower leg: No edema.  Skin:    General: Skin is warm and dry.     Findings: No erythema.  Psychiatric:        Mood and Affect: Mood normal.     ED Results / Procedures / Treatments   Labs (all labs ordered are listed, but only abnormal results are displayed) Labs Reviewed  BASIC METABOLIC PANEL - Abnormal; Notable for the following components:      Result Value   Glucose, Bld 143 (*)    All other components within normal limits  CBC - Abnormal; Notable for the following components:   WBC 11.9 (*)    All other components within normal limits    EKG EKG Interpretation  Date/Time:  Tuesday June 09 2022 12:27:17 EDT Ventricular Rate:  73 PR Interval:  138 QRS Duration: 84 QT Interval:  384 QTC Calculation: 423 R Axis:   33 Text Interpretation: Normal sinus rhythm Cannot rule out Anterior infarct , age undetermined Abnormal ECG No significant  change since last tracing Confirmed by Deno Etienne [phone]) on 06/09/2022 1:40:57 PM  Radiology DG Chest 2 View  Result Date: 06/09/2022 CLINICAL DATA:  Chest pain. EXAM: CHEST - 2 VIEW COMPARISON:  May 26, 2022 FINDINGS: Mild cardiac enlargement similar to previous imaging. Stable appearance of hilar structures. The lungs are clear. No sign of effusion or edema. No pneumothorax. On limited assessment no acute skeletal findings. IMPRESSION: Mild cardiac enlargement. No acute cardiopulmonary disease. Electronically Signed   By: Zetta Bills M.D.   On: 06/09/2022 13:12    Procedures Procedures    Medications Ordered in ED Medications  magnesium sulfate IVPB 2 g 50 mL (has no administration in time range)  ondansetron (ZOFRAN) injection 4 mg (has no administration in time range)  ipratropium-albuterol (DUONEB) 0.5-2.5 (3) MG/3ML nebulizer solution 3 mL (has no administration in time range)    ED Course/ Medical Decision Making/ A&P                           Medical Decision Making Patient has a history of reported Asthma/ COPD with no PFTs as well as T2DM. She presents with multiple complaints most notably diaphoresis and dyspnea. She also notes some positional chest and back discomfort. Finally, she noted some elevated blood pressures.   Unclear exact etiology of patients presenting symptoms. She has had ongoing dyspnea for many months with a negative work up so far including normal TTE. She has scheduled follow up with both pulmonology and cardiology for this. She does have a low  HEART score. She has subjective dyspnea but her O2 sats are >95% on RA and she has no increased work of breathing. Additionally, she recently presented for similar complaint ~2 weeks ago with unrevealing workup. She had a Cta PE 01/2022 that was negative for pulmonary embolism and given that her dyspnea appears stable and chronic, low suspicion for PE this ED visit. She does have a diagnosis of asthma/COPD with  PFTs. She has no wheezing on exam making  an exacerbation of either less likely as culprit of her presenting symptoms.  -Will give nebulizer treatment as patient states these have helped her symptoms in the past.  -Zofran for her nausea   Amount and/or Complexity of Data Reviewed Labs: ordered. Radiology: ordered.  Risk Prescription drug management.    Final Clinical Impression(s) / ED Diagnoses Final diagnoses:  None    Rx / DC Orders ED Discharge Orders     None         Rick Duff, MD 06/09/22 [place], [place], MD 06/09/22 [place], DO 06/09/22 1616

## 2022-06-09 NOTE — Discharge Instructions (Addendum)
If you developed worsening shortness of breath, chest pain that happens with or without movement, worsening headache, changes in your vision, fever or chills please come to the ED.   It is very important for you to come to make your PCP appointment to discuss changing your blood pressure medications.

## 2022-06-09 NOTE — ED Triage Notes (Signed)
Pt. Stated, Ive had high BP and SOB today , my legs are hurting, headache , nausea, and also I have a knot on the left side of my stomach. Once in awhile I have my toes will cramp.

## 2022-08-31 ENCOUNTER — Other Ambulatory Visit: Payer: Self-pay

## 2022-08-31 ENCOUNTER — Emergency Department (HOSPITAL_BASED_OUTPATIENT_CLINIC_OR_DEPARTMENT_OTHER)
Admission: EM | Admit: 2022-08-31 | Discharge: 2022-08-31 | Disposition: A | Discharge status: Home / Self Care | Payer: Medicaid Other | Attending: Emergency Medicine | Admitting: Emergency Medicine

## 2022-08-31 ENCOUNTER — Encounter (HOSPITAL_BASED_OUTPATIENT_CLINIC_OR_DEPARTMENT_OTHER): Payer: Self-pay

## 2022-08-31 DIAGNOSIS — I10 Essential (primary) hypertension: Secondary | ICD-10-CM | POA: Diagnosis not present

## 2022-08-31 DIAGNOSIS — Z20822 Contact with and (suspected) exposure to covid-19: Secondary | ICD-10-CM | POA: Insufficient documentation

## 2022-08-31 DIAGNOSIS — J449 Chronic obstructive pulmonary disease, unspecified: Secondary | ICD-10-CM | POA: Diagnosis not present

## 2022-08-31 DIAGNOSIS — Z79899 Other long term (current) drug therapy: Secondary | ICD-10-CM | POA: Insufficient documentation

## 2022-08-31 DIAGNOSIS — R519 Headache, unspecified: Secondary | ICD-10-CM | POA: Diagnosis present

## 2022-08-31 DIAGNOSIS — J45909 Unspecified asthma, uncomplicated: Secondary | ICD-10-CM | POA: Diagnosis not present

## 2022-08-31 DIAGNOSIS — Z7951 Long term (current) use of inhaled steroids: Secondary | ICD-10-CM | POA: Insufficient documentation

## 2022-08-31 DIAGNOSIS — R0981 Nasal congestion: Secondary | ICD-10-CM | POA: Insufficient documentation

## 2022-08-31 DIAGNOSIS — E119 Type 2 diabetes mellitus without complications: Secondary | ICD-10-CM | POA: Insufficient documentation

## 2022-08-31 DIAGNOSIS — G44209 Tension-type headache, unspecified, not intractable: Secondary | ICD-10-CM | POA: Diagnosis not present

## 2022-08-31 LAB — RESP PANEL BY RT-PCR (FLU A&B, COVID) ARPGX2
Influenza A by PCR: NEGATIVE
Influenza B by PCR: NEGATIVE
SARS Coronavirus 2 by RT PCR: NEGATIVE

## 2022-08-31 NOTE — ED Provider Notes (Signed)
[facility] EMERGENCY DEPT Provider Note  CSN: [id number] Arrival date & time: 08/31/22 0148  Chief Complaint(s) Headache  HPI Susan Hogan is a 61 y.o. female with a past medical history listed below who presents to the emergency department with 3 days of nasal congestion and rhinorrhea with right ear discomfort and headache that became severe tonight.  Headache has been there for several days and gradually worsening.  She is taken over-the-counter home medications including Excedrin with minimal relief.  She denies any nausea or vomiting.  No chest pain.  Shortness of breath with coughing which resolves with her inhalers.  No other physical complaints.  The history is provided by the patient.    Past Medical History Past Medical History:  Diagnosis Date   Anemia    Arthritis    RA "states does not see Rheumatologist"   Asthma    Carpal tunnel syndrome    bilaterally   Complication of anesthesia    difficulty breathing   COPD (chronic obstructive pulmonary disease) (HCC)    COVID-19    Degenerative disk disease    back   Depression    Diabetes mellitus    Fibromyalgia    GERD (gastroesophageal reflux disease)    Headache(784.0)    migraines   Heart murmur    Hepatitis    In followup treatment for hepatitis C   Hypertension    no medication for at this time, sees Dr. Dinah Beers Pcp   Hypotensive episode    hx of   Irritable bowel syndrome    Kidney stone    with hematuria   Memory loss    Migraines    Recurrent upper respiratory infection (URI)    Shortness of breath    Syncope    Patient Active Problem List   Diagnosis Date Noted   UTI (urinary tract infection) 02/20/2020   TIA (transient ischemic attack) 02/19/2020   Sinus bradycardia 02/19/2020   Chronic migraine without aura, with intractable migraine, so stated, with status migrainosus 03/01/2019   Medication overuse headache 03/01/2019   Seasonal allergic rhinitis due to pollen  03/25/2018   Adnexal cyst 03/25/2018   Morbid obesity ([place]) 03/25/2018   HTN (hypertension) 03/25/2018   Pure hypercholesterolemia 03/25/2018   Sleep-disordered breathing 03/25/2018   Vitamin D deficiency 03/25/2018   Bilateral lower extremity edema 03/25/2018   Acute meniscal tear of left knee 11/24/2016   Chronic hepatitis C without hepatic coma ([place]) 11/24/2016   Fatty liver 11/24/2016   H/O degenerative disc disease 11/24/2016   COPD (chronic obstructive pulmonary disease) ([place]) 10/22/2016   Diabetes mellitus ([place]) 07/25/2014   Fibromyalgia 07/25/2014   MDD (major depressive disorder), recurrent severe, without psychosis ([place]) 03/17/2013   Hepatitis C 08/02/2007   IBS (irritable bowel syndrome) 08/02/2007   Mild persistent asthma with acute exacerbation 09/21/1962   Home Medication(s) Prior to Admission medications   Medication Sig Start Date End Date Taking? Authorizing Provider  albuterol (VENTOLIN HFA) 108 (90 Base) MCG/ACT inhaler Inhale 1-2 puffs into the lungs every 6 (six) hours as needed for wheezing or shortness of breath. 11/14/19   Olalere, Cicero Duck A, MD  amLODipine (NORVASC) 10 MG tablet Take 1 tablet (10 mg total) by mouth daily. 10/10/19 02/19/20  O'NealCassie Freer, MD  atorvastatin (LIPITOR) 40 MG tablet Take 1 tablet (40 mg total) by mouth daily. 02/21/20 05/21/20  Darliss Cheney, MD  blood glucose meter kit and supplies KIT Dispense based on patient and insurance preference. Use up to four  times daily as directed. (FOR ICD-9 250.00, 250.01). 03/25/18   Briscoe Deutscher, DO  cetirizine (ZYRTEC) 10 MG tablet Take 10 mg by mouth daily as needed for allergies.  01/17/20   [provider]  fluticasone (FLONASE) 50 MCG/ACT nasal spray Place 1 spray into both nostrils daily. 12/19/19   [provider]  glucose blood (ACCU-CHEK ACTIVE STRIPS) test strip Use as instructed 04/15/16   Nona Dell, PA-C  hydrOXYzine (ATARAX/VISTARIL) 25 MG tablet Take 25-50  mg by mouth at bedtime as needed. 01/17/20   [provider]  metoprolol succinate (TOPROL-XL) 25 MG 24 hr tablet Take 25 mg by mouth as needed.  01/17/20   [provider]  nitrofurantoin, macrocrystal-monohydrate, (MACROBID) 100 MG capsule Take 1 capsule (100 mg total) by mouth 2 (two) times daily. 02/14/22   Sherwood Gambler, MD  olmesartan (BENICAR) 20 MG tablet Take 1 tablet (20 mg total) by mouth daily. 05/26/22   Fatima Blank, MD  ondansetron (ZOFRAN-ODT) 8 MG disintegrating tablet Take 1 tablet (8 mg total) by mouth every 8 (eight) hours as needed for nausea. 02/14/22   Sherwood Gambler, MD  pantoprazole (PROTONIX) 40 MG tablet Take 1 tablet (40 mg total) by mouth daily. 02/14/22   Sherwood Gambler, MD  prochlorperazine (COMPAZINE) 10 MG tablet Take 10 mg by mouth every 6 (six) hours as needed for nausea or vomiting.  12/19/19   [provider]  rizatriptan (MAXALT-MLT) 10 MG disintegrating tablet Take 10 mg by mouth 2 (two) times daily as needed for migraine.  01/17/20   [provider]  sucralfate (CARAFATE) 1 g tablet Take 1 tablet (1 g total) by mouth 4 (four) times daily -  with meals and at bedtime. 02/14/22   Sherwood Gambler, MD  umeclidinium-vilanterol (ANORO ELLIPTA) 62.5-25 MCG/INH AEPB Inhale 1 puff into the lungs daily. Patient taking differently: Inhale 1 puff into the lungs as needed (wheezing).  11/14/19   Laurin Coder, MD  dicyclomine (BENTYL) 20 MG tablet Take 1 tablet (20 mg total) by mouth 2 (two) times daily. Patient not taking: Reported on 10/01/2019 08/19/19 10/01/19  Lorayne Bender, PA-C  diphenhydrAMINE (BENADRYL) 25 MG tablet Take 1 tablet (25 mg total) by mouth every 6 (six) hours as needed. Patient not taking: Reported on 10/01/2019 09/24/19 10/01/19  Petrucelli, Aldona Bar R, PA-C  escitalopram (LEXAPRO) 10 MG tablet TAKE 1 TABLET(10 MG) BY MOUTH DAILY Patient not taking: Reported on 07/05/2018 06/01/18 09/24/19  Briscoe Deutscher, DO   labetalol (NORMODYNE) 100 MG tablet Take 1 tablet (100 mg total) by mouth 2 (two) times daily. Patient not taking: Reported on 10/01/2019 08/30/19 10/01/19  Milton Ferguson, MD  topiramate (TOPAMAX) 100 MG tablet Take 1 tablet (100 mg total) by mouth at bedtime. Patient not taking: Reported on 10/01/2019 03/09/19 10/01/19  Melvenia Beam, MD  Allergies Anesthetic ether [ether], Nubain [nalbuphine hcl], Penicillins, Lisinopril, Advair diskus [fluticasone-salmeterol], Lactose intolerance (gi), and Tizanidine  Review of Systems Review of Systems As noted in HPI  Physical Exam Vital Signs  I have reviewed the triage vital signs BP (!) 185/83   Pulse 74   Temp 98.2 F (36.8 C) (Oral)   Resp 18   Ht _0  (1.499 m)   Wt 98 kg   LMP  (LMP Unknown)   SpO2 99%   BMI 43.63 kg/m   Physical Exam Vitals reviewed.  Constitutional:      General: She is not in acute distress.    Appearance: She is well-developed. She is not diaphoretic.  HENT:     Head: Normocephalic and atraumatic.     Right Ear: Tympanic membrane normal.     Left Ear: Tympanic membrane normal.     Nose: Congestion and rhinorrhea present.     Mouth/Throat:     Mouth: No angioedema.     Pharynx: Oropharynx is clear. No posterior oropharyngeal erythema or uvula swelling.     Tonsils: No tonsillar exudate.  Eyes:     General: No scleral icterus.       Right eye: No discharge.        Left eye: No discharge.     Conjunctiva/sclera: Conjunctivae normal.     Pupils: Pupils are equal, round, and reactive to light.  Cardiovascular:     Rate and Rhythm: Normal rate and regular rhythm.     Heart sounds: No murmur heard.    No friction rub. No gallop.  Pulmonary:     Effort: Pulmonary effort is normal. No respiratory distress.     Breath sounds: Normal breath sounds. No stridor. No wheezing,  rhonchi or rales.  Abdominal:     General: There is no distension.     Palpations: Abdomen is soft.     Tenderness: There is no abdominal tenderness.  Musculoskeletal:        General: No tenderness.     Cervical back: Normal range of motion and neck supple.  Skin:    General: Skin is warm and dry.     Findings: No erythema or rash.  Neurological:     Mental Status: She is alert and oriented to person, place, and time.     ED Results and Treatments Labs (all labs ordered are listed, but only abnormal results are displayed) Labs Reviewed  RESP PANEL BY RT-PCR (FLU A&B, COVID) [id number]                                                                                                                         EKG  EKG Interpretation  Date/Time:    Ventricular Rate:    PR Interval:    QRS Duration:   QT Interval:    QTC Calculation:   R Axis:     Text Interpretation:         Radiology No results found.  Medications  Ordered in ED Medications - No data to display                                                                                                                                   Procedures Procedures  (including critical care time)  Medical Decision Making / ED Course   Medical Decision Making   62 y.o. female presents with cough, rhinorrhea, and headache for 3 days. adequate oral hydration. Rest of history as above.  Patient appears well. No signs of toxicity, patient is interactive and playful. No hypoxia, tachypnea or other signs of respiratory distress. No sign of clinical dehydration. Lung exam clear. Rest of exam as above.  Most consistent with viral upper respiratory infection. COVID/influenza negative.  No evidence suggestive of pharyngitis, AOM, PNA. Chest x-ray not indicated at this time.  Headache significantly improved with thenar acupressure.  Pain went from 9-10 to 2-3.  No additional medications needed.  Likely tension headache.  Doubt ICH,  meningitis.   Discussed symptomatic treatment with the parents and they will follow closely with their PCP.         Final Clinical Impression(s) / ED Diagnoses Final diagnoses:  Tension headache  Nasal congestion   The patient appears reasonably screened and/or stabilized for discharge and I doubt any other medical condition or other [facility] requiring further screening, evaluation, or treatment in the ED at this time. I have discussed the findings, Dx and Tx plan with the patient/family who expressed understanding and agree(s) with the plan. Discharge instructions discussed at length. The patient/family was given strict return precautions who verbalized understanding of the instructions. No further questions at time of discharge.  Disposition: Discharge  Condition: Good  ED Discharge Orders     None       Follow Up: Nicholes Rough, PA-C [place] [address] [phone]  Call  to schedule an appointment for close follow up           This chart was dictated using voice recognition software.  Despite best efforts to proofread,  errors can occur which can change the documentation meaning.    Fatima Blank, MD 08/31/22 [phone]

## 2022-08-31 NOTE — ED Triage Notes (Signed)
Pt states that she has had congestion, headache, fatigue and right ear x 3 days

## 2022-12-21 ENCOUNTER — Ambulatory Visit: Payer: Medicaid Other | Admitting: Pulmonary Disease

## 2022-12-24 ENCOUNTER — Other Ambulatory Visit (HOSPITAL_BASED_OUTPATIENT_CLINIC_OR_DEPARTMENT_OTHER): Payer: Self-pay

## 2022-12-24 DIAGNOSIS — J42 Unspecified chronic bronchitis: Secondary | ICD-10-CM

## 2022-12-24 DIAGNOSIS — J4531 Mild persistent asthma with (acute) exacerbation: Secondary | ICD-10-CM

## 2022-12-26 ENCOUNTER — Emergency Department (HOSPITAL_BASED_OUTPATIENT_CLINIC_OR_DEPARTMENT_OTHER)
Admission: EM | Admit: 2022-12-26 | Discharge: 2022-12-26 | Disposition: A | Discharge status: Home / Self Care | Payer: Medicaid Other | Attending: Emergency Medicine | Admitting: Emergency Medicine

## 2022-12-26 ENCOUNTER — Emergency Department (HOSPITAL_BASED_OUTPATIENT_CLINIC_OR_DEPARTMENT_OTHER): Payer: Medicaid Other

## 2022-12-26 ENCOUNTER — Other Ambulatory Visit: Payer: Self-pay

## 2022-12-26 DIAGNOSIS — Z79899 Other long term (current) drug therapy: Secondary | ICD-10-CM | POA: Insufficient documentation

## 2022-12-26 DIAGNOSIS — J45909 Unspecified asthma, uncomplicated: Secondary | ICD-10-CM | POA: Diagnosis not present

## 2022-12-26 DIAGNOSIS — U071 COVID-19: Secondary | ICD-10-CM | POA: Diagnosis not present

## 2022-12-26 DIAGNOSIS — I10 Essential (primary) hypertension: Secondary | ICD-10-CM | POA: Insufficient documentation

## 2022-12-26 DIAGNOSIS — E119 Type 2 diabetes mellitus without complications: Secondary | ICD-10-CM | POA: Insufficient documentation

## 2022-12-26 DIAGNOSIS — J449 Chronic obstructive pulmonary disease, unspecified: Secondary | ICD-10-CM | POA: Diagnosis not present

## 2022-12-26 DIAGNOSIS — R059 Cough, unspecified: Secondary | ICD-10-CM | POA: Diagnosis present

## 2022-12-26 DIAGNOSIS — Z7951 Long term (current) use of inhaled steroids: Secondary | ICD-10-CM | POA: Diagnosis not present

## 2022-12-26 LAB — RESP PANEL BY RT-PCR (RSV, FLU A&B, COVID)  RVPGX2
Influenza A by PCR: NEGATIVE
Influenza B by PCR: NEGATIVE
Resp Syncytial Virus by PCR: NEGATIVE
SARS Coronavirus 2 by RT PCR: POSITIVE — AB

## 2022-12-26 MED ORDER — IBUPROFEN 800 MG PO TABS
800.0000 mg | ORAL_TABLET | Freq: Once | ORAL | Status: AC
  Administered 2022-12-26: 800 mg via ORAL
  Filled 2022-12-26: qty 1

## 2022-12-26 MED ORDER — MOLNUPIRAVIR EUA 200MG CAPSULE: 4.0000 | ORAL_CAPSULE | Freq: Two times a day (BID) | ORAL | 0 refills | Status: DC

## 2022-12-26 MED ORDER — MOLNUPIRAVIR EUA 200MG CAPSULE: 4.0000 | ORAL_CAPSULE | Freq: Two times a day (BID) | ORAL | 0 refills | Status: AC

## 2022-12-26 NOTE — ED Provider Notes (Signed)
[facility] Provider Note   CSN: [id number] Arrival date & time: 12/26/22  [id number]     History  Chief Complaint  Patient presents with   Shortness of Breath   Cough    Ernestyne Earnest ConroyFlores is a 62 y.o. female.  Patient is a 62 year old female with history of COPD, asthma, hypertension, type 2 diabetes, fibromyalgia.  Patient presenting today for evaluation of cough and congestion.  This has been worsening over the past several days.  Patient recently experienced a 7251-month long respiratory illness for which she was prescribed multiple medications by her primary doctor.  Symptoms are now returning.  She reports fever at home to 103.2 along with frequent sneezing.  The history is provided by the patient.       Home Medications Prior to Admission medications   Medication Sig Start Date End Date Taking? Authorizing Provider  albuterol (VENTOLIN HFA) 108 (90 Base) MCG/ACT inhaler Inhale 1-2 puffs into the lungs every 6 (six) hours as needed for wheezing or shortness of breath. 11/14/19   Olalere, Onnie BoerAdewale A, MD  amLODipine (NORVASC) 10 MG tablet Take 1 tablet (10 mg total) by mouth daily. 10/10/19 02/19/20  O'NealRonnald Ramp, [place], MD  atorvastatin (LIPITOR) 40 MG tablet Take 1 tablet (40 mg total) by mouth daily. 02/21/20 05/21/20  Hughie ClossPahwani, Ravi, MD  blood glucose meter kit and supplies KIT Dispense based on patient and insurance preference. Use up to four times daily as directed. (FOR ICD-9 250.00, 250.01). 03/25/18   Helane RimaWallace, Erica, DO  cetirizine (ZYRTEC) 10 MG tablet Take 10 mg by mouth daily as needed for allergies.  01/17/20   [provider]  fluticasone (FLONASE) 50 MCG/ACT nasal spray Place 1 spray into both nostrils daily. 12/19/19   [provider]  glucose blood (ACCU-CHEK ACTIVE STRIPS) test strip Use as instructed 04/15/16   Barrett HenleNadeau, Nicole Elizabeth, PA-C  hydrOXYzine (ATARAX/VISTARIL) 25 MG tablet Take 25-50 mg by mouth at bedtime as  needed. 01/17/20   [provider]  metoprolol succinate (TOPROL-XL) 25 MG 24 hr tablet Take 25 mg by mouth as needed.  01/17/20   [provider]  nitrofurantoin, macrocrystal-monohydrate, (MACROBID) 100 MG capsule Take 1 capsule (100 mg total) by mouth 2 (two) times daily. 02/14/22   Pricilla LovelessGoldston, Scott, MD  olmesartan (BENICAR) 20 MG tablet Take 1 tablet (20 mg total) by mouth daily. 05/26/22   Nira Connardama, Pedro Eduardo, MD  ondansetron (ZOFRAN-ODT) 8 MG disintegrating tablet Take 1 tablet (8 mg total) by mouth every 8 (eight) hours as needed for nausea. 02/14/22   Pricilla LovelessGoldston, Scott, MD  pantoprazole (PROTONIX) 40 MG tablet Take 1 tablet (40 mg total) by mouth daily. 02/14/22   Pricilla LovelessGoldston, Scott, MD  prochlorperazine (COMPAZINE) 10 MG tablet Take 10 mg by mouth every 6 (six) hours as needed for nausea or vomiting.  12/19/19   [provider]  rizatriptan (MAXALT-MLT) 10 MG disintegrating tablet Take 10 mg by mouth 2 (two) times daily as needed for migraine.  01/17/20   [provider]  sucralfate (CARAFATE) 1 g tablet Take 1 tablet (1 g total) by mouth 4 (four) times daily -  with meals and at bedtime. 02/14/22   Pricilla LovelessGoldston, Scott, MD  umeclidinium-vilanterol (ANORO ELLIPTA) 62.5-25 MCG/INH AEPB Inhale 1 puff into the lungs daily. Patient taking differently: Inhale 1 puff into the lungs as needed (wheezing).  11/14/19   Tomma Lightninglalere, Adewale A, MD  dicyclomine (BENTYL) 20 MG tablet Take 1 tablet (20 mg total) by  mouth 2 (two) times daily. Patient not taking: Reported on 10/01/2019 08/19/19 10/01/19  Anselm Pancoast, PA-C  diphenhydrAMINE (BENADRYL) 25 MG tablet Take 1 tablet (25 mg total) by mouth every 6 (six) hours as needed. Patient not taking: Reported on 10/01/2019 09/24/19 10/01/19  Petrucelli, Lelon Mast R, PA-C  escitalopram (LEXAPRO) 10 MG tablet TAKE 1 TABLET(10 MG) BY MOUTH DAILY Patient not taking: Reported on 07/05/2018 06/01/18 09/24/19  Helane Rima, DO  labetalol (NORMODYNE) 100 MG  tablet Take 1 tablet (100 mg total) by mouth 2 (two) times daily. Patient not taking: Reported on 10/01/2019 08/30/19 10/01/19  Bethann Berkshire, MD  topiramate (TOPAMAX) 100 MG tablet Take 1 tablet (100 mg total) by mouth at bedtime. Patient not taking: Reported on 10/01/2019 03/09/19 10/01/19  Anson Fret, MD      Allergies    Anesthetic ether [ether], Nubain [nalbuphine hcl], Penicillins, Lisinopril, Advair diskus [fluticasone-salmeterol], Lactose intolerance (gi), and Tizanidine    Review of Systems   Review of Systems  All other systems reviewed and are negative.   Physical Exam Updated Vital Signs BP (!) 188/90 (BP Location: Right Arm)   Pulse 84   Temp 98.8 F (37.1 C) (Oral)   Resp 20   Wt 98 kg   LMP  (LMP Unknown)   SpO2 99%   BMI 43.64 kg/m  Physical Exam Vitals and nursing note reviewed.  Constitutional:      General: She is not in acute distress.    Appearance: She is well-developed. She is not diaphoretic.  HENT:     Head: Normocephalic and atraumatic.  Cardiovascular:     Rate and Rhythm: Normal rate and regular rhythm.     Heart sounds: No murmur heard.    No friction rub. No gallop.  Pulmonary:     Effort: Pulmonary effort is normal. No respiratory distress.     Breath sounds: Normal breath sounds. No wheezing.  Abdominal:     General: Bowel sounds are normal. There is no distension.     Palpations: Abdomen is soft.     Tenderness: There is no abdominal tenderness.  Musculoskeletal:        General: Normal range of motion.     Cervical back: Normal range of motion and neck supple.  Skin:    General: Skin is warm and dry.  Neurological:     General: No focal deficit present.     Mental Status: She is alert and oriented to person, place, and time.     ED Results / Procedures / Treatments   Labs (all labs ordered are listed, but only abnormal results are displayed) Labs Reviewed  RESP PANEL BY RT-PCR (RSV, FLU A&B, COVID)  [id number]    EKG EKG  Interpretation  Date/Time:  Saturday December 26 2022 03:53:22 EDT Ventricular Rate:  83 PR Interval:  142 QRS Duration: 99 QT Interval:  393 QTC Calculation: 462 R Axis:   66 Text Interpretation: Sinus rhythm Probable left ventricular hypertrophy Inferior infarct, age indeterminate No significant change since 06/09/2022 Confirmed by Geoffery Lyons ([id number]) on 12/26/2022 4:04:34 AM  Radiology No results found.  Procedures Procedures    Medications Ordered in ED Medications - No data to display  ED Course/ Medical Decision Making/ A&P  Patient is a 62 year old female with recent URI treated with antibiotics and various other medications.  She was feeling better until 2 days ago when symptoms returned.  Tonight she had temp of 103 and cough/sneezing.  She arrives here afebrile with  stable vital signs and no hypoxia.  COVID/flu/RSV testing obtained was positive for COVID.  Chest x-ray is clear.  Patient to be treated with molnupiravir, over-the-counter medications, and return as needed.  There is no hypoxia and I feel as though patient can safely be discharged.  She did receive Motrin 800 for headache here in the ER.  Final Clinical Impression(s) / ED Diagnoses Final diagnoses:  None    Rx / DC Orders ED Discharge Orders     None         Geoffery Lyons, MD 12/26/22 [phone]

## 2022-12-26 NOTE — Discharge Instructions (Addendum)
Begin taking molnupiravir as prescribed.  Take over-the-counter medications as needed for relief of symptoms.  Return to the emergency department if symptoms significantly worsen or change.

## 2022-12-26 NOTE — ED Triage Notes (Addendum)
Sick for 2 mo with URI sx and then PNA.  Now experiencing HA, SOB, sneezing, eye irritation, sore throat and cough since yesterday. H/o asthma  Has tried zyrtec and cough  Takes amlodipine for blood pressure, htn in triage at [facility]

## 2022-12-28 ENCOUNTER — Encounter (HOSPITAL_BASED_OUTPATIENT_CLINIC_OR_DEPARTMENT_OTHER): Payer: Medicaid Other

## 2022-12-30 ENCOUNTER — Ambulatory Visit: Payer: Medicaid Other | Admitting: Primary Care

## 2023-01-05 ENCOUNTER — Encounter (HOSPITAL_BASED_OUTPATIENT_CLINIC_OR_DEPARTMENT_OTHER): Payer: Medicaid Other

## 2023-01-07 ENCOUNTER — Ambulatory Visit: Payer: Medicaid Other | Admitting: Primary Care

## 2023-02-08 ENCOUNTER — Ambulatory Visit (HOSPITAL_BASED_OUTPATIENT_CLINIC_OR_DEPARTMENT_OTHER): Payer: Medicaid Other

## 2023-02-08 ENCOUNTER — Encounter (HOSPITAL_BASED_OUTPATIENT_CLINIC_OR_DEPARTMENT_OTHER): Payer: Self-pay

## 2023-02-09 ENCOUNTER — Ambulatory Visit (INDEPENDENT_AMBULATORY_CARE_PROVIDER_SITE_OTHER): Payer: Medicaid Other | Admitting: Primary Care

## 2023-02-09 ENCOUNTER — Encounter: Payer: Self-pay | Admitting: Primary Care

## 2023-02-09 VITALS — BP 138/80 | HR 74 | Temp 98.1°F | Ht 59.0 in | Wt 206.8 lb

## 2023-02-09 DIAGNOSIS — J4489 Other specified chronic obstructive pulmonary disease: Secondary | ICD-10-CM | POA: Diagnosis not present

## 2023-02-09 LAB — POCT EXHALED NITRIC OXIDE: FeNO level (ppb): 22

## 2023-02-09 MED ORDER — SPIRIVA RESPIMAT 1.25 MCG/ACT IN AERS: 2.0000 | INHALATION_SPRAY | Freq: Every day | RESPIRATORY_TRACT | 0 refills | Status: DC

## 2023-02-09 NOTE — Patient Instructions (Addendum)
Recommendations: Continue Allergy medication Refill Albuterol medication   Orders: FENO re: asthma  Labs today (respiratory allergy panel) Basic spirometry re: asthma (ordered)  Follow-up: Please schedule 30 min PFT and follow-up in 6-8 weeks with Dr. Wynona Neat

## 2023-02-09 NOTE — Progress Notes (Signed)
@Patient  ID: Susan Hogan, female    DOB: 1961-07-19, 62 y.o.   MRN: [id number]  Chief Complaint  Patient presents with   Follow-up    Needs medication refills on albuterol MDI and nebulizer solution.    Referring provider: Ladora Daniel, PA-C  HPI: 62 year old female, never smoked.  Past medical history significant for asthma/COPD.  Dr. Wynona Neat, last seen in office on 11/14/2019.  02/09/2023 Patient was last seen in February 2021 by Dr. Wynona Neat for shortness of breath, dyspnea felt to be multifactorial.  She has a past hx of asthma. Never smoked but had second hard exposure. She was ordered for pulmonary function testing which were never complete.  She has been tried on Breo in the past but developed hives.  It was recommended that she be started on Anoro during consult and continue albuterol as needed.  Patient was to follow back up with our office in 6 weeks but failed to do so.  She canceled several appointments prior to today's visit.  She had COVID in November 2020 and recently in April 2024.  X-ray on 12/26/2022 showed mild cardiomegaly, mild aortic tortuosity and calcification. Lungs were clear, no pleural effusion. She was unable to do PFTs yesterday due to claustrophobia.   She continues to experience dyspnea symptoms and chest tightness at night and with exertion. She is using Albuterol as needed. She did not tolerate BREO. She never received/filled prescription for Anoro.  She was unable to do full PFTs due to claustrophobia, plan get basic spirometry. FENO today was 22.    Allergies  Allergen Reactions   Anesthetic Ether [Ether] Shortness Of Breath    IV anesthesia causes extreme pain and tightness in back and ribcage.   Nubain [Nalbuphine Hcl] Anaphylaxis   Penicillins Hives and Shortness Of Breath    DID THE REACTION INVOLVE: Swelling of the face/tongue/throat, SOB, or low BP? Yes Sudden or severe rash/hives, skin peeling, or the inside of the mouth or nose? Yes Did it  require medical treatment? Yes When did it last happen? About 3 years ago    If all above answers are "NO", may proceed with cephalosporin use.   Lisinopril Other (See Comments)    Pt states that this causes her bp to increase.   Advair Diskus [Fluticasone-Salmeterol] Hives   Lactose Intolerance (Gi) Diarrhea   Tizanidine Palpitations and Other (See Comments)    "made my right side numb"    Immunization History  Administered Date(s) Administered   Pneumococcal Polysaccharide-23 12/03/2016    Past Medical History:  Diagnosis Date   Anemia    Arthritis    RA "states does not see Rheumatologist"   Asthma    Carpal tunnel syndrome    bilaterally   Complication of anesthesia    difficulty breathing   COPD (chronic obstructive pulmonary disease) (HCC)    COVID-19    Degenerative disk disease    back   Depression    Diabetes mellitus    Fibromyalgia    GERD (gastroesophageal reflux disease)    Headache(784.0)    migraines   Heart murmur    Hepatitis    In followup treatment for hepatitis C   Hypertension    no medication for at this time, sees Dr. Estevan Oaks Pcp   Hypotensive episode    hx of   Irritable bowel syndrome    Kidney stone    with hematuria   Memory loss    Migraines    Recurrent upper respiratory infection (URI)  Shortness of breath    Syncope     Tobacco History: Social History   Tobacco Use  Smoking Status Never   Passive exposure: Never  Smokeless Tobacco Never   Counseling given: Not Answered   Outpatient Medications Prior to Visit  Medication Sig Dispense Refill   blood glucose meter kit and supplies KIT Dispense based on patient and insurance preference. Use up to four times daily as directed. (FOR ICD-9 250.00, 250.01). 1 each 0   fluticasone (FLONASE) 50 MCG/ACT nasal spray Place 1 spray into both nostrils daily as needed for allergies.     glucose blood (ACCU-CHEK ACTIVE STRIPS) test strip Use as instructed 100 each 12    nitrofurantoin, macrocrystal-monohydrate, (MACROBID) 100 MG capsule Take 1 capsule (100 mg total) by mouth 2 (two) times daily. (Patient not taking: Reported on 06/09/2023) 10 capsule 0   olmesartan (BENICAR) 20 MG tablet Take 1 tablet (20 mg total) by mouth daily. (Patient not taking: Reported on 06/09/2023) 30 tablet 0   ondansetron (ZOFRAN-ODT) 8 MG disintegrating tablet Take 1 tablet (8 mg total) by mouth every 8 (eight) hours as needed for nausea. (Patient not taking: Reported on 06/09/2023) 20 tablet 0   pantoprazole (PROTONIX) 40 MG tablet Take 1 tablet (40 mg total) by mouth daily. (Patient not taking: Reported on 06/09/2023) 30 tablet 0   sucralfate (CARAFATE) 1 g tablet Take 1 tablet (1 g total) by mouth 4 (four) times daily -  with meals and at bedtime. (Patient not taking: Reported on 06/09/2023) 90 tablet 0   albuterol (VENTOLIN HFA) 108 (90 Base) MCG/ACT inhaler Inhale 1-2 puffs into the lungs every 6 (six) hours as needed for wheezing or shortness of breath. 18 g 5   cetirizine (ZYRTEC) 10 MG tablet Take 10 mg by mouth daily as needed for allergies.      hydrOXYzine (ATARAX/VISTARIL) 25 MG tablet Take 25-50 mg by mouth at bedtime as needed.     metoprolol succinate (TOPROL-XL) 25 MG 24 hr tablet Take 25 mg by mouth as needed.  (Patient not taking: Reported on 06/09/2023)     prochlorperazine (COMPAZINE) 10 MG tablet Take 10 mg by mouth every 6 (six) hours as needed for nausea or vomiting.  (Patient not taking: Reported on 06/09/2023)     rizatriptan (MAXALT-MLT) 10 MG disintegrating tablet Take 10 mg by mouth 2 (two) times daily as needed for migraine.  (Patient not taking: Reported on 06/09/2023)     umeclidinium-vilanterol (ANORO ELLIPTA) 62.5-25 MCG/INH AEPB Inhale 1 puff into the lungs daily. (Patient taking differently: Inhale 1 puff into the lungs as needed (wheezing).) 60 each 0   amLODipine (NORVASC) 10 MG tablet Take 1 tablet (10 mg total) by mouth daily. 90 tablet 3   atorvastatin  (LIPITOR) 40 MG tablet Take 1 tablet (40 mg total) by mouth daily. 30 tablet 2   No facility-administered medications prior to visit.      Review of Systems  Review of Systems  Constitutional: Negative.   Respiratory:  Positive for chest tightness and shortness of breath.    Physical Exam  BP 138/80 (BP Location: Right Arm, Patient Position: Sitting, Cuff Size: Large)   Pulse 74   Temp 98.1 F (36.7 C) (Oral)   Ht 4\' 11"  (1.499 m)   Wt 206 lb 12.8 oz (93.8 kg)   LMP  (LMP Unknown)   SpO2 98%   BMI 41.77 kg/m  Physical Exam Constitutional:      Appearance: Normal appearance.  Cardiovascular:  Rate and Rhythm: Normal rate.  Pulmonary:     Effort: Pulmonary effort is normal.  Skin:    General: Skin is warm and dry.  Neurological:     General: No focal deficit present.     Mental Status: She is alert and oriented to person, place, and time. Mental status is at baseline.  Psychiatric:        Mood and Affect: Mood normal.        Behavior: Behavior normal.        Thought Content: Thought content normal.        Judgment: Judgment normal.      Lab Results:  CBC    Component Value Date/Time   WBC 9.8 06/09/2023 0046   RBC 3.64 (L) 06/09/2023 0046   HGB 11.9 (L) 06/09/2023 0046   HCT 34.6 (L) 06/09/2023 0046   PLT 165 06/09/2023 0046   MCV 95.1 06/09/2023 0046   MCH 32.7 06/09/2023 0046   MCHC 34.4 06/09/2023 0046   RDW 13.8 06/09/2023 0046   LYMPHSABS 3.0 06/09/2023 0046   MONOABS 0.8 06/09/2023 0046   EOSABS 0.2 06/09/2023 0046   BASOSABS 0.1 06/09/2023 0046    BMET    Component Value Date/Time   NA 137 06/09/2023 0046   K 3.6 06/09/2023 0046   CL 107 06/09/2023 0046   CO2 22 06/09/2023 0046   GLUCOSE 136 (H) 06/09/2023 0046   BUN 19 06/09/2023 0046   CREATININE 0.86 06/09/2023 0046   CREATININE 0.71 04/08/2018 1536   CALCIUM 8.3 (L) 06/09/2023 0046   GFRNONAA >60 06/09/2023 0046   GFRAA >60 02/19/2020 1500    BNP    Component Value  Date/Time   BNP 40.1 05/26/2022 0425    ProBNP No results found for: "PROBNP"  Imaging: DG Chest Portable 1 View  Result Date: 06/09/2023 CLINICAL DATA:  Shortness of breath EXAM: PORTABLE CHEST 1 VIEW COMPARISON:  12/26/2022 FINDINGS: Cardiac shadow is within normal limits. The lungs are well aerated bilaterally. No focal infiltrate or effusion is seen. No bony abnormality is noted. IMPRESSION: No acute abnormality noted. Electronically Signed   By: Alcide Clever M.D.   On: 06/09/2023 03:51     Assessment & Plan:   Asthma-COPD overlap syndrome - Seen for initial pulmonary consult in February 2021 by Dr. Wynona Neat.  Dyspnea felt to be multifactorial.  She was ordered for pulmonary function testing which were never completed.  These will need to be rescheduled.  She has previously tried and failed both Breo and Anoro.   Recommendations: Continue Allergy medication Refill Albuterol medication   Orders: FENO re: asthma  Labs today (respiratory allergy panel) Basic spirometry re: asthma (ordered)  Follow-up: Please schedule 30 min PFT and follow-up in 6-8 weeks with Dr. Daleen Squibb, NP 06/21/2023

## 2023-02-09 NOTE — Assessment & Plan Note (Addendum)
-   Seen for initial pulmonary consult in February 2021 by Dr. Wynona Neat.  Dyspnea felt to be multifactorial.  She was ordered for pulmonary function testing which were never completed.  These will need to be rescheduled.  She has previously tried and failed both Breo and Anoro.   Recommendations: Continue Allergy medication Refill Albuterol medication   Orders: FENO re: asthma  Labs today (respiratory allergy panel) Basic spirometry re: asthma (ordered)  Follow-up: Please schedule 30 min PFT and follow-up in 6-8 weeks with Dr. Wynona Neat

## 2023-02-10 ENCOUNTER — Telehealth: Payer: Self-pay | Admitting: Primary Care

## 2023-02-10 ENCOUNTER — Telehealth: Payer: Self-pay

## 2023-02-10 DIAGNOSIS — J4531 Mild persistent asthma with (acute) exacerbation: Secondary | ICD-10-CM

## 2023-02-10 LAB — RESPIRATORY ALLERGY PROFILE REGION II ~~LOC~~
Allergen, A. alternata, m6: <0.1 kU/L
Allergen, Cedar tree, t12: 0.14 kU/L — ABNORMAL HIGH
Allergen, Comm Silver Birch, t9: 7.85 kU/L — ABNORMAL HIGH
Allergen, Cottonwood, t14: <0.1 kU/L
Allergen, D pternoyssinus,d7: 1.59 kU/L — ABNORMAL HIGH
Allergen, Mouse Urine Protein, e78: <0.1 kU/L
Allergen, Mulberry, t76: <0.1 kU/L
Allergen, Oak,t7: 24.5 kU/L — ABNORMAL HIGH
Allergen, P. notatum, m1: <0.1 kU/L
Aspergillus fumigatus, m3: <0.1 kU/L
Bermuda Grass: 0.22 kU/L — ABNORMAL HIGH
Box Elder IgE: 0.16 kU/L — ABNORMAL HIGH
CLADOSPORIUM HERBARUM (M2) IGE: <0.1 kU/L
COMMON RAGWEED (SHORT) (W1) IGE: 1.49 kU/L — ABNORMAL HIGH
Cat Dander: <0.1 kU/L
Class: 0
Class: 0
Class: 0
Class: 0
Class: 0
Class: 0
Class: 0
Class: 0
Class: 0
Class: 0
Class: 0
Class: 0
Class: 1
Class: 2
Class: 2
Class: 2
Class: 2
Class: 3
Class: 3
Class: 4
Cockroach: <0.1 kU/L
D. farinae: 2.05 kU/L — ABNORMAL HIGH
Dog Dander: <0.1 kU/L
Elm IgE: 0.39 kU/L — ABNORMAL HIGH
IgE (Immunoglobulin E), Serum: 112 kU/L (ref <=114)
Johnson Grass: 0.17 kU/L — ABNORMAL HIGH
Pecan/Hickory Tree IgE: 3.56 kU/L — ABNORMAL HIGH
Rough Pigweed  IgE: <0.1 kU/L
Sheep Sorrel IgE: <0.1 kU/L
Timothy Grass: 0.7 kU/L — ABNORMAL HIGH

## 2023-02-10 LAB — CBC WITH DIFFERENTIAL/PLATELET
Basophils Absolute: 0.1 10*3/uL (ref 0.0–0.1)
Basophils Relative: 1.2 % (ref 0.0–3.0)
Eosinophils Absolute: 0.2 10*3/uL (ref 0.0–0.7)
Eosinophils Relative: 1.8 % (ref 0.0–5.0)
HCT: 38 % (ref 36.0–46.0)
Hemoglobin: 12.8 g/dL (ref 12.0–15.0)
Lymphocytes Relative: 21.5 % (ref 12.0–46.0)
Lymphs Abs: 2 10*3/uL (ref 0.7–4.0)
MCHC: 33.6 g/dL (ref 30.0–36.0)
MCV: 95.8 fl (ref 78.0–100.0)
Monocytes Absolute: 0.7 10*3/uL (ref 0.1–1.0)
Monocytes Relative: 7.3 % (ref 3.0–12.0)
Neutro Abs: 6.4 10*3/uL (ref 1.4–7.7)
Neutrophils Relative %: 68.2 % (ref 43.0–77.0)
Platelets: 191 10*3/uL (ref 150.0–400.0)
RBC: 3.97 Mil/uL (ref 3.87–5.11)
RDW: 14.1 % (ref 11.5–15.5)
WBC: 9.4 10*3/uL (ref 4.0–10.5)

## 2023-02-10 LAB — INTERPRETATION:

## 2023-02-10 MED ORDER — ALBUTEROL SULFATE HFA 108 (90 BASE) MCG/ACT IN AERS: 1.0000 | INHALATION_SPRAY | Freq: Four times a day (QID) | RESPIRATORY_TRACT | 5 refills | Status: AC | PRN

## 2023-02-10 NOTE — Telephone Encounter (Signed)
Patient checking on 2 RX's to be called in. Pharmacy is [facility] [address]. Patient phone number is [phone].

## 2023-02-10 NOTE — Telephone Encounter (Signed)
-----   Message from Glenford Bayley, NP sent at 02/10/2023  3:40 PM EDT ----- Patient has several allergies to dust mites, trees, gras, ragweed. Refer to allergy please

## 2023-02-10 NOTE — Telephone Encounter (Signed)
Called patient.  Gave all lab result information.  Sent in referral for allergy evaluation.

## 2023-02-10 NOTE — Progress Notes (Signed)
Patient has several allergies to dust mites, trees, gras, ragweed. Refer to allergy please

## 2023-02-10 NOTE — Telephone Encounter (Signed)
I spoke with the patient. She said Susan Hogan was going to send in Albuterol to her pharmacy and it was not there yet.   Per office visit from yesterday-  Instructions  Recommendations: Continue Allergy medication Refill Albuterol medication        I have sent in the Albuterol refill and notified the patient.  Nothing further needed.

## 2023-02-25 ENCOUNTER — Ambulatory Visit: Payer: Medicaid Other | Attending: Physician Assistant

## 2023-02-25 DIAGNOSIS — M6281 Muscle weakness (generalized): Secondary | ICD-10-CM | POA: Insufficient documentation

## 2023-02-25 DIAGNOSIS — M5459 Other low back pain: Secondary | ICD-10-CM | POA: Insufficient documentation

## 2023-02-25 DIAGNOSIS — R29898 Other symptoms and signs involving the musculoskeletal system: Secondary | ICD-10-CM | POA: Insufficient documentation

## 2023-03-04 ENCOUNTER — Ambulatory Visit: Payer: Medicaid Other

## 2023-03-04 ENCOUNTER — Other Ambulatory Visit: Payer: Self-pay

## 2023-03-04 DIAGNOSIS — M6281 Muscle weakness (generalized): Secondary | ICD-10-CM | POA: Diagnosis present

## 2023-03-04 DIAGNOSIS — M5459 Other low back pain: Secondary | ICD-10-CM | POA: Diagnosis present

## 2023-03-04 DIAGNOSIS — R29898 Other symptoms and signs involving the musculoskeletal system: Secondary | ICD-10-CM

## 2023-03-04 NOTE — Therapy (Signed)
OUTPATIENT PHYSICAL THERAPY NEURO EVALUATION   Patient Name: Susan Hogan MRN: [id number] DOB:April 17, 1961, 62 y.o., female Today's Date: 03/04/2023   PCP: Ladora Daniel, PA-C REFERRING PROVIDER: Ernest Mallick, PA-C  END OF SESSION:  PT End of Session - 03/04/23 1530     Visit Number 1    Number of Visits 6    Date for PT Re-Evaluation 04/29/23    Authorization Type [employer] Assurance//[employer]    PT Start Time 1530    PT Stop Time 1615    PT Time Calculation (min) 45 min             Past Medical History:  Diagnosis Date   Anemia    Arthritis    RA "states does not see Rheumatologist"   Asthma    Carpal tunnel syndrome    bilaterally   Complication of anesthesia    difficulty breathing   COPD (chronic obstructive pulmonary disease) (HCC)    COVID-19    Degenerative disk disease    back   Depression    Diabetes mellitus    Fibromyalgia    GERD (gastroesophageal reflux disease)    Headache(784.0)    migraines   Heart murmur    Hepatitis    In followup treatment for hepatitis C   Hypertension    no medication for at this time, sees Dr. Estevan Oaks Pcp   Hypotensive episode    hx of   Irritable bowel syndrome    Kidney stone    with hematuria   Memory loss    Migraines    Recurrent upper respiratory infection (URI)    Shortness of breath    Syncope    Past Surgical History:  Procedure Laterality Date   CHOLECYSTECTOMY  2019   DILATION AND CURETTAGE OF UTERUS     Left knee surgery     LIVER BIOPSY     TONSILLECTOMY     TUBAL LIGATION     Patient Active Problem List   Diagnosis Date Noted   UTI (urinary tract infection) 02/20/2020   TIA (transient ischemic attack) 02/19/2020   Sinus bradycardia 02/19/2020   Chronic migraine without aura, with intractable migraine, so stated, with status migrainosus 03/01/2019   Medication overuse headache 03/01/2019   Seasonal allergic rhinitis due to pollen 03/25/2018   Adnexal cyst  03/25/2018   Morbid obesity (HCC) 03/25/2018   HTN (hypertension) 03/25/2018   Pure hypercholesterolemia 03/25/2018   Sleep-disordered breathing 03/25/2018   Vitamin D deficiency 03/25/2018   Bilateral lower extremity edema 03/25/2018   Acute meniscal tear of left knee 11/24/2016   Chronic hepatitis C without hepatic coma (HCC) 11/24/2016   Fatty liver 11/24/2016   H/O degenerative disc disease 11/24/2016   Asthma-COPD overlap syndrome 10/22/2016   Diabetes mellitus (HCC) 07/25/2014   Fibromyalgia 07/25/2014   MDD (major depressive disorder), recurrent severe, without psychosis (HCC) 03/17/2013   Hepatitis C 08/02/2007   IBS (irritable bowel syndrome) 08/02/2007   Mild persistent asthma with acute exacerbation 09/21/1962    ONSET DATE: 3 years ago  REFERRING DIAG:  R26.89 (ICD-10-CM) - Other abnormalities of gait and mobility    THERAPY DIAG:  Muscle weakness (generalized)  Other symptoms and signs involving the musculoskeletal system  Other low back pain  Rationale for Evaluation and Treatment: Rehabilitation  SUBJECTIVE:  SUBJECTIVE STATEMENT: Was hospitalized for COVID and reports she received an injection in each hip and was unable to walk for some reason.  Patient reports she then went to see a spine specialist and received back injections and reports she was then able to walk with improvement.  Patient reports primarily the issue is back pain with walking and notes radiating LLE pain.    Patient reports that her visits with spine specialist have stopped as she was recommending surgery vs continued injections.  Patient is interested in pursuing PT to improve her back pain and overall activity tolerance  Pt accompanied by: self  PERTINENT HISTORY: Past medical history significant for  asthma/COPD  PAIN:  Are you having pain? No  PRECAUTIONS: None  WEIGHT BEARING RESTRICTIONS: No  FALLS: Has patient fallen in last 6 months? No  LIVING ENVIRONMENT: Lives with: lives with their family and lives with their daughter Lives in: House/apartment Stairs: Yes: External: 2 steps; HR present Has following equipment at home: Environmental consultant - 4 wheeled  PLOF: Independent  PATIENT GOALS: be able to stand/walk longer  OBJECTIVE:   DIAGNOSTIC FINDINGS:   COGNITION: Overall cognitive status: Within functional limits for tasks assessed   SENSATION: WFL  COORDINATION: WNL   EDEMA:  none   POSTURE: No Significant postural limitations  LOWER EXTREMITY ROM:     ROM WNL  LUMBAR SPINE ROM: Flexion: able to reach mid-patella Extension: to neutral Lateral flexion/rotation: [facility] but uncomfortable  LOWER EXTREMITY MMT:    Gross 4/5 BLE strength assessed in sitting  Trunk strength: 2+/5  BED MOBILITY:  Indep  TRANSFERS: Assistive device utilized: None  Sit to stand: Complete Independence Stand to sit: Complete Independence Chair to chair: Complete Independence Floor:  NT   CURB:  Level of Assistance: Modified independence Assistive device utilized: None Curb Comments: increased time and hesitant  STAIRS: Level of Assistance: Modified independence Stair Negotiation Technique: Alternating Pattern  with Single Rail on Right   GAIT: Gait pattern: WFL Distance walked: 1,035 ft Assistive device utilized: None Level of assistance: Complete Independence Comments:   FUNCTIONAL TESTS:  30 seconds chair stand test 6 minute walk test: 1,035 ft vitals pre-test: 96%, 64 bpm; post-test 96%, 83 bpm 30 sec chair stand test: 8 reps      PATIENT EDUCATION: Education details: assessment details. Recommend trial of walking for exercise with her rollator to see if this helps stamina and reduce back pain during activity Person educated: Patient Education method:  Explanation Education comprehension: verbalized understanding  HOME EXERCISE PROGRAM: TBD  GOALS: Goals reviewed with patient? Yes  SHORT TERM GOALS: Target date: 03/25/2023    Patient will be independent in HEP to improve functional outcomes  Baseline: Goal status: INITIAL  2.  Teach-back strategies to improve household function without back pain limiting Baseline: reports unable to stand to cook meal Goal status: INITIAL  3. Demo improved lumbar flexibility as evident by ability to forward flex and reach mid-shin to improve mobility and ADL tolerance  Baseline: mid-patella  Goal status: INITIAL   LONG TERM GOALS: Target date: 04/29/2023    Patient will be independent in HEP to improve functional outcomes with activities to include land and aquatic-based interventions Baseline:  Goal status: INITIAL  2.  Manifest improved BLE strength per 12 reps on 30 second chair to stand test Baseline: 8 reps Goal status: INITIAL  3.  Demo improved activity/gait tolerance ambulating distance of 1,200 ft during Baseline: 1,035 ft Goal status: INITIAL  ASSESSMENT:  CLINICAL IMPRESSION: Patient is a 62 y.o. lady who was seen today for physical therapy evaluation and treatment for other abnormalities of gait and mobility.  Pt reports chronic LBP and occasional radiating LE pain which is exacerbated by prolonged standing, walking, activity which overall limits activity tolerance and participation.  Demonstrates generalized weakness and reduced gait speed/endurance/tolerance pre outcome measures.  Patient would benefit from PT intervention to address deifcits and limitations   OBJECTIVE IMPAIRMENTS: decreased activity tolerance, decreased endurance, difficulty walking, decreased strength, impaired flexibility, and pain.   ACTIVITY LIMITATIONS: bending, standing, and locomotion level  PARTICIPATION LIMITATIONS: meal prep, shopping, community activity, and exercise  compliance  PERSONAL FACTORS: Age, Time since onset of injury/illness/exacerbation, and 1-2 comorbidities: PMH  are also affecting patient's functional outcome.   REHAB POTENTIAL: Good  CLINICAL DECISION MAKING: Stable/uncomplicated  EVALUATION COMPLEXITY: Low  PLAN:  PT FREQUENCY: 1x/week  PT DURATION: 6 weeks  PLANNED INTERVENTIONS: Therapeutic exercises, Therapeutic activity, Neuromuscular re-education, Balance training, Gait training, Patient/Family education, Self Care, Joint mobilization, Stair training, Vestibular training, Canalith repositioning, DME instructions, Aquatic Therapy, Dry Needling, Electrical stimulation, Spinal mobilization, Cryotherapy, Moist heat, Ionotophoresis 4mg /ml Dexamethasone, and Manual therapy  PLAN FOR NEXT SESSION: land-based HEP for lumbar ROM/strength   5:11 PM, 03/04/23 M. Shary Decamp, PT, DPT Physical Therapist- [place] Office Number: [phone]

## 2023-03-09 ENCOUNTER — Ambulatory Visit: Payer: Medicaid Other | Admitting: Physical Therapy

## 2023-03-16 ENCOUNTER — Encounter: Payer: Self-pay | Admitting: Rehabilitation

## 2023-03-16 ENCOUNTER — Ambulatory Visit: Payer: Medicaid Other | Admitting: Rehabilitation

## 2023-03-16 DIAGNOSIS — M6281 Muscle weakness (generalized): Secondary | ICD-10-CM | POA: Diagnosis not present

## 2023-03-16 DIAGNOSIS — M5459 Other low back pain: Secondary | ICD-10-CM

## 2023-03-16 DIAGNOSIS — R29898 Other symptoms and signs involving the musculoskeletal system: Secondary | ICD-10-CM

## 2023-03-16 NOTE — Therapy (Signed)
OUTPATIENT PHYSICAL THERAPY NEURO TREATMENT   Patient Name: Susan Hogan MRN: [id number] DOB:01-28-1961, 62 y.o., female Today's Date: 03/16/2023   PCP: Ladora Daniel, PA-C REFERRING PROVIDER: Ernest Mallick, PA-C  END OF SESSION:  PT End of Session - 03/16/23 0825     Visit Number 2    Number of Visits 6    Date for PT Re-Evaluation 04/29/23    Authorization Type [employer] Assurance//[employer]    PT Start Time 0930    PT Stop Time 1015    PT Time Calculation (min) 45 min    Equipment Utilized During Treatment Other (comment)   floatation devices as needed for safety   Activity Tolerance Patient tolerated treatment well    Behavior During Therapy WFL for tasks assessed/performed             Past Medical History:  Diagnosis Date   Anemia    Arthritis    RA "states does not see Rheumatologist"   Asthma    Carpal tunnel syndrome    bilaterally   Complication of anesthesia    difficulty breathing   COPD (chronic obstructive pulmonary disease) (HCC)    COVID-19    Degenerative disk disease    back   Depression    Diabetes mellitus    Fibromyalgia    GERD (gastroesophageal reflux disease)    Headache(784.0)    migraines   Heart murmur    Hepatitis    In followup treatment for hepatitis C   Hypertension    no medication for at this time, sees Dr. Estevan Oaks Pcp   Hypotensive episode    hx of   Irritable bowel syndrome    Kidney stone    with hematuria   Memory loss    Migraines    Recurrent upper respiratory infection (URI)    Shortness of breath    Syncope    Past Surgical History:  Procedure Laterality Date   CHOLECYSTECTOMY  2019   DILATION AND CURETTAGE OF UTERUS     Left knee surgery     LIVER BIOPSY     TONSILLECTOMY     TUBAL LIGATION     Patient Active Problem List   Diagnosis Date Noted   UTI (urinary tract infection) 02/20/2020   TIA (transient ischemic attack) 02/19/2020   Sinus bradycardia 02/19/2020    Chronic migraine without aura, with intractable migraine, so stated, with status migrainosus 03/01/2019   Medication overuse headache 03/01/2019   Seasonal allergic rhinitis due to pollen 03/25/2018   Adnexal cyst 03/25/2018   Morbid obesity (HCC) 03/25/2018   HTN (hypertension) 03/25/2018   Pure hypercholesterolemia 03/25/2018   Sleep-disordered breathing 03/25/2018   Vitamin D deficiency 03/25/2018   Bilateral lower extremity edema 03/25/2018   Acute meniscal tear of left knee 11/24/2016   Chronic hepatitis C without hepatic coma (HCC) 11/24/2016   Fatty liver 11/24/2016   H/O degenerative disc disease 11/24/2016   Asthma-COPD overlap syndrome 10/22/2016   Diabetes mellitus (HCC) 07/25/2014   Fibromyalgia 07/25/2014   MDD (major depressive disorder), recurrent severe, without psychosis (HCC) 03/17/2013   Hepatitis C 08/02/2007   IBS (irritable bowel syndrome) 08/02/2007   Mild persistent asthma with acute exacerbation 09/21/1962    ONSET DATE: 3 years ago  REFERRING DIAG:  R26.89 (ICD-10-CM) - Other abnormalities of gait and mobility    THERAPY DIAG:  Muscle weakness (generalized)  Other symptoms and signs involving the musculoskeletal system  Other low back pain  Rationale for Evaluation  and Treatment: Rehabilitation  SUBJECTIVE:                                                                                                                                                                                             SUBJECTIVE STATEMENT: Pt presents to pool reporting 8/10 pain in low back and legs.  R>L today.  Is excited to get in the pool to work.   Pt accompanied by: self  PERTINENT HISTORY: Past medical history significant for asthma/COPD  PAIN:  Are you having pain? Yes: NPRS scale: 8/10 Pain location: low back and legs Pain description: sore, tight, achy Aggravating factors: walking, standing prolonged periods Relieving factors: heat  PRECAUTIONS:  None  WEIGHT BEARING RESTRICTIONS: No  FALLS: Has patient fallen in last 6 months? No  LIVING ENVIRONMENT: Lives with: lives with their family and lives with their daughter Lives in: House/apartment Stairs: Yes: External: 2 steps; HR present Has following equipment at home: Environmental consultant - 4 wheeled  PLOF: Independent  PATIENT GOALS: be able to stand/walk longer  OBJECTIVE:   DIAGNOSTIC FINDINGS:   COGNITION: Overall cognitive status: Within functional limits for tasks assessed   SENSATION: WFL  COORDINATION: WNL   EDEMA:  none   POSTURE: No Significant postural limitations  LOWER EXTREMITY ROM:     ROM WNL  LUMBAR SPINE ROM: Flexion: able to reach mid-patella Extension: to neutral Lateral flexion/rotation: [facility] but uncomfortable  LOWER EXTREMITY MMT:    Gross 4/5 BLE strength assessed in sitting  Trunk strength: 2+/5   FUNCTIONAL TESTS:  30 seconds chair stand test 6 minute walk test: 1,035 ft vitals pre-test: 96%, 64 bpm; post-test 96%, 83 bpm 30 sec chair stand test: 8 reps   Todays Treatment:   Patient seen for aquatic therapy today.  Treatment took place in water 3.6-4.0 feet deep depending upon activity.  Pt entered and exited the pool via stairs using rails.  Pool temp approx 92 deg.   Warm up:  Forwards walking x 18' x 4 laps, backwards walking x 4 laps, side stepping x 4 laps all without UE support.  Pt reporting slight pain in posterior R leg with backwards walking.   Stretching:  Standing with back against wall, hamstring stretch using noodle x 30 secs>laterally for groin stretch x 30 secs.  Did BLEs.  Had her stand facing pool bench with leg in figure 4 position with forward lean for piriformis stretch x 30 secs on each leg.     LE/core strengthening:  Feet apart, moving pool noodle side/side for trunk rotation in gentle pattern x 20 reps. Standing with feet apart, attempted to  have her perform B shoulder flex/ext (with elbow ext) with large pool  noodle, however this was too difficult so progressed to smaller pool noodle and performed x 10 reps with emphasis on slow return to surface of water.  Ended with 10 fast reps, keeping noodle in water for increased core strengthening.  With back against pool wall, performed knee and hip ext with foot on noodle for resistance x 10 reps>quick reps x 10 for increased strengthening.  Standing at pool wall for UE support performing heel lifts x 10 reps, squats x 10 reps with min cues for correct technique.  Sitting on yellow noodle "saddle" sit style maintaining balance with intermittent min/guard or wall for support>LAQ's x 10 reps, bicyling to opposite end of pool x 2 laps.  "Swing" sitting on noodle maintaining balance>LAQ's x 10 reps, alt UE raises x 10 reps, needing intermittent min to mod A to maintain balance.    NMR:  Standing holding small pool noodle for support, performing LE hip flex/ext (with knee ext) x 10 reps each side.  Pt with slight pain in R ankle so did 2 sets of 5 on each side to give ankle more rest.    Discussion of benefits of aquatic therapy to reduce load on joints and allow for more strengthening in pain free environment.  Discussed that she does have pool sessions scheduled in July but is going out of town end of this week for about 10 days.  She reports that she also has cirrhosis of liver and has to see a specialist and get a biopsy for this.     Pt requires buoyancy of water for support for reduced fall risk and for unloading/reduced stress on joints (spine and B knees) as pt able to tolerate increased standing and ambulation in water compared to that on land; viscosity of water is needed for resistance for strengthening and current of water provides perturbations for challenge for balance training       PATIENT EDUCATION: Education details: aquatic PT rationale Person educated: Patient Education method: Explanation Education comprehension: verbalized understanding  HOME  EXERCISE PROGRAM: TBD  GOALS: Goals reviewed with patient? Yes  SHORT TERM GOALS: Target date: 03/25/2023    Patient will be independent in HEP to improve functional outcomes  Baseline: Goal status: INITIAL  2.  Teach-back strategies to improve household function without back pain limiting Baseline: reports unable to stand to cook meal Goal status: INITIAL  3. Demo improved lumbar flexibility as evident by ability to forward flex and reach mid-shin to improve mobility and ADL tolerance  Baseline: mid-patella  Goal status: INITIAL   LONG TERM GOALS: Target date: 04/29/2023    Patient will be independent in HEP to improve functional outcomes with activities to include land and aquatic-based interventions Baseline:  Goal status: INITIAL  2.  Manifest improved BLE strength per 12 reps on 30 second chair to stand test Baseline: 8 reps Goal status: INITIAL  3.  Demo improved activity/gait tolerance ambulating distance of 1,200 ft during Baseline: 1,035 ft Goal status: INITIAL    ASSESSMENT:  CLINICAL IMPRESSION: Pt seen for first aquatic session today at [facility] pool.  She presents with 8/10 pain but following session reports 6/10 pain with less pain in backs of legs.  Pt needs cuing for safety with movements as she tends to move quick and wants larger ROM.  Pt needed support for all strengthening and balance tasks today.  Will benefit from ongoing pool therapy to address remaining  deficits.  Educated on benefits of ongoing pool exercises and she mentions he may join here or [employer] to have access to pool and gym.    OBJECTIVE IMPAIRMENTS: decreased activity tolerance, decreased endurance, difficulty walking, decreased strength, impaired flexibility, and pain.   ACTIVITY LIMITATIONS: bending, standing, and locomotion level  PARTICIPATION LIMITATIONS: meal prep, shopping, community activity, and exercise compliance  PERSONAL FACTORS: Age, Time since onset of  injury/illness/exacerbation, and 1-2 comorbidities: PMH  are also affecting patient's functional outcome.   REHAB POTENTIAL: Good  CLINICAL DECISION MAKING: Stable/uncomplicated  EVALUATION COMPLEXITY: Low  PLAN:  PT FREQUENCY: 1x/week  PT DURATION: 6 weeks  PLANNED INTERVENTIONS: Therapeutic exercises, Therapeutic activity, Neuromuscular re-education, Balance training, Gait training, Patient/Family education, Self Care, Joint mobilization, Stair training, Vestibular training, Canalith repositioning, DME instructions, Aquatic Therapy, Dry Needling, Electrical stimulation, Spinal mobilization, Cryotherapy, Moist heat, Ionotophoresis 4mg /ml Dexamethasone, and Manual therapy  PLAN FOR NEXT SESSION: land-based HEP for lumbar ROM/strength  Pool:  Focus on gentle strengthening, stretching (hamstrings, piriformis), sit<>stand from bench, step ups (forward/lateral), core strengthening     Harriet Butte, PT, MPT [facility] [address] [place], [place], [address] Phone: [phone]   Fax:  [phone] 03/16/23, 10:26 AM

## 2023-03-30 ENCOUNTER — Ambulatory Visit: Payer: Medicaid Other | Attending: Physician Assistant

## 2023-03-30 DIAGNOSIS — M5459 Other low back pain: Secondary | ICD-10-CM | POA: Diagnosis present

## 2023-03-30 DIAGNOSIS — R29898 Other symptoms and signs involving the musculoskeletal system: Secondary | ICD-10-CM | POA: Insufficient documentation

## 2023-03-30 DIAGNOSIS — M6281 Muscle weakness (generalized): Secondary | ICD-10-CM | POA: Insufficient documentation

## 2023-03-30 NOTE — Therapy (Signed)
OUTPATIENT PHYSICAL THERAPY NEURO TREATMENT   Patient Name: Susan Hogan MRN: [id number] DOB:[birth year], 62 y.o., female Today's Date: 03/30/2023   PCP: Ladora Daniel, PA-C REFERRING PROVIDER: Ernest Mallick, PA-C  END OF SESSION:  PT End of Session - 03/30/23 1609     Visit Number 3    Number of Visits 6    Date for PT Re-Evaluation 04/29/23    Authorization Type [employer] Assurance//[employer]    PT Start Time 1615    PT Stop Time 1700    PT Time Calculation (min) 45 min    Equipment Utilized During Treatment Other (comment)   floatation devices as needed for safety   Activity Tolerance Patient tolerated treatment well    Behavior During Therapy WFL for tasks assessed/performed             Past Medical History:  Diagnosis Date   Anemia    Arthritis    RA "states does not see Rheumatologist"   Asthma    Carpal tunnel syndrome    bilaterally   Complication of anesthesia    difficulty breathing   COPD (chronic obstructive pulmonary disease) (HCC)    COVID-19    Degenerative disk disease    back   Depression    Diabetes mellitus    Fibromyalgia    GERD (gastroesophageal reflux disease)    Headache(784.0)    migraines   Heart murmur    Hepatitis    In followup treatment for hepatitis C   Hypertension    no medication for at this time, sees Dr. Estevan Oaks Pcp   Hypotensive episode    hx of   Irritable bowel syndrome    Kidney stone    with hematuria   Memory loss    Migraines    Recurrent upper respiratory infection (URI)    Shortness of breath    Syncope    Past Surgical History:  Procedure Laterality Date   CHOLECYSTECTOMY  2019   DILATION AND CURETTAGE OF UTERUS     Left knee surgery     LIVER BIOPSY     TONSILLECTOMY     TUBAL LIGATION     Patient Active Problem List   Diagnosis Date Noted   UTI (urinary tract infection) 02/20/2020   TIA (transient ischemic attack) 02/19/2020   Sinus bradycardia 02/19/2020    Chronic migraine without aura, with intractable migraine, so stated, with status migrainosus 03/01/2019   Medication overuse headache 03/01/2019   Seasonal allergic rhinitis due to pollen 03/25/2018   Adnexal cyst 03/25/2018   Morbid obesity (HCC) 03/25/2018   HTN (hypertension) 03/25/2018   Pure hypercholesterolemia 03/25/2018   Sleep-disordered breathing 03/25/2018   Vitamin D deficiency 03/25/2018   Bilateral lower extremity edema 03/25/2018   Acute meniscal tear of left knee 11/24/2016   Chronic hepatitis C without hepatic coma (HCC) 11/24/2016   Fatty liver 11/24/2016   H/O degenerative disc disease 11/24/2016   Asthma-COPD overlap syndrome 10/22/2016   Diabetes mellitus (HCC) 07/25/2014   Fibromyalgia 07/25/2014   MDD (major depressive disorder), recurrent severe, without psychosis (HCC) 03/17/2013   Hepatitis C 08/02/2007   IBS (irritable bowel syndrome) 08/02/2007   Mild persistent asthma with acute exacerbation 09/21/1962    ONSET DATE: 3 years ago  REFERRING DIAG:  R26.89 (ICD-10-CM) - Other abnormalities of gait and mobility    THERAPY DIAG:  Muscle weakness (generalized)  Other symptoms and signs involving the musculoskeletal system  Other low back pain  Rationale for Evaluation  and Treatment: Rehabilitation  SUBJECTIVE:                                                                                                                                                                                             SUBJECTIVE STATEMENT: Did a lot of walking during vacation and the lateral lower legs are very painful.  Pt accompanied by: self  PERTINENT HISTORY: Past medical history significant for asthma/COPD  PAIN:  Are you having pain? Yes: NPRS scale: 8/10 Pain location: low back and legs Pain description: sore, tight, achy Aggravating factors: walking, standing prolonged periods Relieving factors: heat  PRECAUTIONS: None  WEIGHT BEARING RESTRICTIONS:  No  FALLS: Has patient fallen in last 6 months? No  LIVING ENVIRONMENT: Lives with: lives with their family and lives with their daughter Lives in: House/apartment Stairs: Yes: External: 2 steps; HR present Has following equipment at home: Environmental consultant - 4 wheeled  PLOF: Independent  PATIENT GOALS: be able to stand/walk longer  OBJECTIVE:   TODAY'S TREATMENT: 03/30/23 Activity Comments  Lumbar trunk rolls x 60 sec   Ab set 2x10   Active hamstring stretch 10x 2 sec   Hip flexion isometric 1x10 hooklying  Seated rows 2x10 15#  Lat pull down 2x10 15#  Pt education in exercise format and metabolic preference   NU-step x 6 min Speed intervals 30 sec fast; 60 sec slow    PATIENT EDUCATION: Education details: aquatic PT rationale Person educated: Patient Education method: Explanation Education comprehension: verbalized understanding  HOME EXERCISE PROGRAM: Access Code: [email or web address] URL: [email or web address] Date: 03/30/2023 Prepared by: Shary Decamp  Exercises - Supine Lower Trunk Rotation  - 1 x daily - 7 x weekly - 3 sets - 10 reps - Supine Pelvic Tilt  - 1 x daily - 7 x weekly - 3 sets - 10 reps - 2 sec hold - Supine Hamstring Stretch  - 1 x daily - 7 x weekly - 3 sets - 10 reps - 2 sec hold - Hooklying Isometric Hip Flexion  - 1 x daily - 7 x weekly - 1-3 sets - 10 reps - 2 sec hold  DIAGNOSTIC FINDINGS:   COGNITION: Overall cognitive status: Within functional limits for tasks assessed   SENSATION: WFL  COORDINATION: WNL   EDEMA:  none   POSTURE: No Significant postural limitations  LOWER EXTREMITY ROM:     ROM WNL  LUMBAR SPINE ROM: Flexion: able to reach mid-patella Extension: to neutral Lateral flexion/rotation: [facility] but uncomfortable  LOWER EXTREMITY MMT:    Gross 4/5 BLE strength assessed in sitting  Trunk strength: 2+/5   FUNCTIONAL TESTS:  30  seconds chair stand test 6 minute walk test: 1,035 ft vitals pre-test: 96%, 64 bpm;  post-test 96%, 83 bpm 30 sec chair stand test: 8 reps      GOALS: Goals reviewed with patient? Yes  SHORT TERM GOALS: Target date: 03/25/2023    Patient will be independent in HEP to improve functional outcomes  Baseline: Goal status: IN PROGRESS  2.  Teach-back strategies to improve household function without back pain limiting Baseline: reports unable to stand to cook meal Goal status: IN PROGRESS  3. Demo improved lumbar flexibility as evident by ability to forward flex and reach mid-shin to improve mobility and ADL tolerance  Baseline: mid-patella  Goal status: INITIAL   LONG TERM GOALS: Target date: 04/29/2023    Patient will be independent in HEP to improve functional outcomes with activities to include land and aquatic-based interventions Baseline:  Goal status: INITIAL  2.  Manifest improved BLE strength per 12 reps on 30 second chair to stand test Baseline: 8 reps Goal status: INITIAL  3.  Demo improved activity/gait tolerance ambulating distance of 1,200 ft during Baseline: 1,035 ft Goal status: INITIAL    ASSESSMENT:  CLINICAL IMPRESSION: Pt reports increased aching/fatigue from recent trip that required extended standing/walking.  Reports good tolerance to pool-based session at last visit. Inititaed training today for HEP development including lumbar mobility/stretching and strength training to improve recruitment and stability.  Education on general strength and cardiovascular training as it pertains to chronic LBP. Voiced understanding.  Will continue aquatic/land based sessions for comprehensive HEP development.  OBJECTIVE IMPAIRMENTS: decreased activity tolerance, decreased endurance, difficulty walking, decreased strength, impaired flexibility, and pain.   ACTIVITY LIMITATIONS: bending, standing, and locomotion level  PARTICIPATION LIMITATIONS: meal prep, shopping, community activity, and exercise compliance  PERSONAL FACTORS: Age, Time since  onset of injury/illness/exacerbation, and 1-2 comorbidities: PMH  are also affecting patient's functional outcome.   REHAB POTENTIAL: Good  CLINICAL DECISION MAKING: Stable/uncomplicated  EVALUATION COMPLEXITY: Low  PLAN:  PT FREQUENCY: 1x/week  PT DURATION: 6 weeks  PLANNED INTERVENTIONS: Therapeutic exercises, Therapeutic activity, Neuromuscular re-education, Balance training, Gait training, Patient/Family education, Self Care, Joint mobilization, Stair training, Vestibular training, Canalith repositioning, DME instructions, Aquatic Therapy, Dry Needling, Electrical stimulation, Spinal mobilization, Cryotherapy, Moist heat, Ionotophoresis 4mg /ml Dexamethasone, and Manual therapy  PLAN FOR NEXT SESSION: land-based HEP for lumbar ROM/strength  Pool:  Focus on gentle strengthening, stretching (hamstrings, piriformis), sit<>stand from bench, step ups (forward/lateral), core strengthening     4:55 PM, 03/30/23 M. Shary Decamp, PT, DPT Physical Therapist- [place] Office Number: [phone]

## 2023-04-04 NOTE — Progress Notes (Deleted)
NEW PATIENT Date of Service/Encounter:  04/04/23 Referring provider: Glenford Bayley, NP Primary care provider: Ladora Daniel, PA-C  Subjective:  Susan Hogan is a 62 y.o. female with a PMHx of depression, HTN, obesity, IBS, DM, hepatitis C, vitamin D deficieny, fatty liver, hypercholesterolemia presenting today for evaluation of asthma/COPD overlap and chronic rhinitis. History obtained from: chart review and {Persons; PED relatives w/patient:[id number]::"patient"}.   Asthma/COPD History:  Follows by [facility] -Diagnosed at age ***.  -Current symptoms include {Blank multiple:19196:a:"***","chest tightness","cough","shortness of breath","wheezing"} *** daytime symptoms in past month, *** nighttime awakenings in past month Using rescue inhaler *** -Limitations to daily activity: {Blank single:19197::"none","mild","some","severe"} - *** ED visits, *** UC visits and *** oral steroids in the past year - *** number of lifetime hospitalizations, *** number of lifetime intubations.  - Identified Triggers: {Blank multiple:19196:a:"***","exercise","respiratory illness","smoke exposure","cold air"} - Up-to-date with {Blank multiple:19196:a:"***","pneumonia,","Covid-19,","Flu,"} vaccines. - History of prior pneumonias: *** - History of prior COVID-19 infection: *** - Smoking exposure: *** Previous Diagnostics:  - Prior PFTs or spirometry: none - Most Recent AEC (03/14/23): 150 -Most Recent Chest Imaging: CXR on (12/26/22): No active disease. Mild cardiomegaly. Aortic atherosclerosis. Stable chest.  - 02/09/23: FENO 22  - Today's Asthma Control Test:  .   Management:  - Previously used therapies: breo-didn't tolerate, reported hives.  - Current regimen:  - Maintenance: spiriva - Rescue: Albuterol 2 puffs q4-6 hrs PRN, *** prior to exercise  Chronic rhinitis: started *** Symptoms include: {Blank multiple:19196:a:"***","nasal congestion","rhinorrhea","post nasal  drainage","sneezing","watery eyes","itchy eyes","itchy nose"}  Occurs {Blank single:19197::"year-round","seasonally-***","year-round with seasonal flares","***"} Potential triggers: *** Treatments tried: *** Previous allergy testing:  yes-labs 02/09/23 environmental panel: + DM,tree pollen, grass pollen, ragweed pollen  ; total IgE 112 History of reflux/heartburn: {Blank single:19197::"yes","no"} Previous sinus, ear, tonsil, adenoid surgeries: *** Previous sinus imaging: ***  Referral from [facility] for chronic rhinitis.   Other allergy screening: Asthma: {Blank single:19197::"yes","no"} Rhino conjunctivitis: {Blank single:19197::"yes","no"} Food allergy: {Blank single:19197::"yes","no"} Medication allergy: {Blank single:19197::"yes","no"} Hymenoptera allergy: {Blank single:19197::"yes","no"} Urticaria: {Blank single:19197::"yes","no"} Eczema:{Blank single:19197::"yes","no"} History of recurrent infections suggestive of immunodeficency: {Blank single:19197::"yes","no"} ***Vaccinations are up to date.   Past Medical History: Past Medical History:  Diagnosis Date   Anemia    Arthritis    RA "states does not see Rheumatologist"   Asthma    Carpal tunnel syndrome    bilaterally   Complication of anesthesia    difficulty breathing   COPD (chronic obstructive pulmonary disease) (HCC)    COVID-19    Degenerative disk disease    back   Depression    Diabetes mellitus    Fibromyalgia    GERD (gastroesophageal reflux disease)    Headache(784.0)    migraines   Heart murmur    Hepatitis    In followup treatment for hepatitis C   Hypertension    no medication for at this time, sees Dr. Estevan Oaks Pcp   Hypotensive episode    hx of   Irritable bowel syndrome    Kidney stone    with hematuria   Memory loss    Migraines    Recurrent upper respiratory infection (URI)    Shortness of breath    Syncope    Medication List:  Current Outpatient Medications   Medication Sig Dispense Refill   albuterol (VENTOLIN HFA) 108 (90 Base) MCG/ACT inhaler Inhale 1-2 puffs into the lungs every 6 (six) hours as needed for wheezing or shortness of breath. 18 g 5   amLODipine (NORVASC) 10 MG tablet Take 1 tablet (  10 mg total) by mouth daily. 90 tablet 3   atorvastatin (LIPITOR) 40 MG tablet Take 1 tablet (40 mg total) by mouth daily. 30 tablet 2   blood glucose meter kit and supplies KIT Dispense based on patient and insurance preference. Use up to four times daily as directed. (FOR ICD-9 250.00, 250.01). 1 each 0   cetirizine (ZYRTEC) 10 MG tablet Take 10 mg by mouth daily as needed for allergies.      fluticasone (FLONASE) 50 MCG/ACT nasal spray Place 1 spray into both nostrils daily.     glucose blood (ACCU-CHEK ACTIVE STRIPS) test strip Use as instructed 100 each 12   hydrOXYzine (ATARAX/VISTARIL) 25 MG tablet Take 25-50 mg by mouth at bedtime as needed.     metoprolol succinate (TOPROL-XL) 25 MG 24 hr tablet Take 25 mg by mouth as needed.      nitrofurantoin, macrocrystal-monohydrate, (MACROBID) 100 MG capsule Take 1 capsule (100 mg total) by mouth 2 (two) times daily. 10 capsule 0   olmesartan (BENICAR) 20 MG tablet Take 1 tablet (20 mg total) by mouth daily. 30 tablet 0   ondansetron (ZOFRAN-ODT) 8 MG disintegrating tablet Take 1 tablet (8 mg total) by mouth every 8 (eight) hours as needed for nausea. 20 tablet 0   pantoprazole (PROTONIX) 40 MG tablet Take 1 tablet (40 mg total) by mouth daily. 30 tablet 0   prochlorperazine (COMPAZINE) 10 MG tablet Take 10 mg by mouth every 6 (six) hours as needed for nausea or vomiting.      rizatriptan (MAXALT-MLT) 10 MG disintegrating tablet Take 10 mg by mouth 2 (two) times daily as needed for migraine.      sucralfate (CARAFATE) 1 g tablet Take 1 tablet (1 g total) by mouth 4 (four) times daily -  with meals and at bedtime. 90 tablet 0   Tiotropium Bromide Monohydrate (SPIRIVA RESPIMAT) 1.25 MCG/ACT AERS Inhale 2 puffs  into the lungs daily. 4 g 0   No current facility-administered medications for this visit.   Known Allergies:  Allergies  Allergen Reactions   Anesthetic Ether [Ether] Shortness Of Breath    IV anesthesia causes extreme pain and tightness in back and ribcage.   Nubain [Nalbuphine Hcl] Anaphylaxis   Penicillins Hives and Shortness Of Breath    DID THE REACTION INVOLVE: Swelling of the face/tongue/throat, SOB, or low BP? Yes Sudden or severe rash/hives, skin peeling, or the inside of the mouth or nose? Yes Did it require medical treatment? Yes When did it last happen? About 3 years ago    If all above answers are "NO", may proceed with cephalosporin use.   Lisinopril Other (See Comments)    Pt states that this causes her bp to increase.   Advair Diskus [Fluticasone-Salmeterol] Hives   Lactose Intolerance (Gi) Diarrhea   Tizanidine Palpitations and Other (See Comments)    "made my right side numb"   Past Surgical History: Past Surgical History:  Procedure Laterality Date   CHOLECYSTECTOMY  2019   DILATION AND CURETTAGE OF UTERUS     Left knee surgery     LIVER BIOPSY     TONSILLECTOMY     TUBAL LIGATION     Family History: Family History  Problem Relation Age of Onset   Heart disease Mother    Diabetes Mother    Asthma Mother    Anemia Mother    Cerebral aneurysm Mother        ruptured   Kidney failure Mother  Migraines Mother    Parkinson's disease Mother    Colon cancer Maternal Uncle    Heart disease Maternal Uncle    Heart disease Maternal Grandmother    Diabetes Maternal Grandmother    Asthma Maternal Grandmother    Heart Problems Maternal Grandmother    Heart Problems Other        through family   Diabetes Granddaughter    Diabetes Daughter    Social History: [place] lives ***.   ROS:  All other systems negative except as noted per HPI.  Objective:  There were no vitals taken for this visit. There is no height or weight on file to calculate  BMI. Physical Exam:  General Appearance:  Alert, cooperative, no distress, appears stated age  Head:  Normocephalic, without obvious abnormality, atraumatic  Eyes:  Conjunctiva clear, EOM's intact  Nose: Nares normal, {Blank multiple:19196:a:"***","hypertrophic turbinates","normal mucosa","no visible anterior polyps","septum midline"}  Throat: Lips, tongue normal; teeth and gums normal, {Blank multiple:19196:a:"***","normal posterior oropharynx","tonsils 2+","tonsils 3+","no tonsillar exudate","+ cobblestoning","surgically absent tonsils","mildly erythematous posterior oropharynx"}  Neck: Supple, symmetrical  Lungs:   {Blank multiple:19196:a:"***","clear to auscultation bilaterally","end-expiratory wheezing","wheezing throughout"}, Respirations unlabored, {Blank multiple:19196:a:"***","no coughing","intermittent dry coughing","intermittent productive-sounding cough"}  Heart:  {Blank multiple:19196:a:"***","regular rate and rhythm","no murmur"}, Appears well perfused  Extremities: No edema  Skin: {Blank multiple:19196:a:"***","erythematous, dry patches scattered on ***","lichenification on ***","Skin color, texture, turgor normal","no rashes or lesions on visualized portions of skin"}  Neurologic: No gross deficits     Diagnostics: Spirometry:  Tracings reviewed. Her effort: {Blank single:19197::"Good reproducible efforts.","It was hard to get consistent efforts and there is a question as to whether this reflects a maximal maneuver.","Poor effort, data can not be interpreted.","Variable effort-results affected","effort okay for first attempt at spirometry.","Results not reproducible due to ***"} FVC: ***L (pre), ***L  (post) FEV1: ***L, ***% predicted (pre), ***L, ***% predicted (post) FEV1/FVC ratio: *** (pre), *** (post) Interpretation: {Blank single:19197::"Spirometry consistent with mild obstructive disease","Spirometry consistent with moderate obstructive disease","Spirometry consistent  with severe obstructive disease","Spirometry consistent with possible restrictive disease","Spirometry consistent with mixed obstructive and restrictive disease","Spirometry uninterpretable due to technique","Spirometry consistent with normal pattern","No overt abnormalities noted given today's efforts","Nonobstructive ratio, low FEV1","Nonobstructive ratio, low FEV1, possible restriction"}.  Please see scanned spirometry results for details.  Skin Testing: {Blank single:19197::"Select foods","Environmental allergy panel","Environmental allergy panel and select foods","Food allergy panel","None","Deferred due to recent antihistamines use","deferred due to recent reaction","Pediatric Environmental Allergy Panel","Pediatric Food Panel","Select foods and environmental allergies"}. {Blank single:19197::"Adequate positive and negative controls","Inadequate positive control-testing invalid","Adequate positive and negative controls, dermatographism present, testing difficult to interpret"}. Results discussed with patient/family.   {Blank single:19197::"Allergy testing results were read and interpreted by myself, documented by clinical staff.","Allergy testing results were read by ***,FNP, documented by clinical staff"}  Assessment and Plan  ***  {Blank single:19197::"This note in its entirety was forwarded to the Provider who requested this consultation."}  Thank you for your kind referral. I appreciate the opportunity to take part in Amna's care. Please do not hesitate to contact me with questions.***  Sincerely,  Tonny Bollman, MD [facility]

## 2023-04-05 ENCOUNTER — Ambulatory Visit: Payer: Medicaid Other | Admitting: Internal Medicine

## 2023-04-05 ENCOUNTER — Ambulatory Visit: Payer: Medicaid Other | Admitting: Physical Therapy

## 2023-04-05 ENCOUNTER — Encounter: Payer: Self-pay | Admitting: Physical Therapy

## 2023-04-05 DIAGNOSIS — M6281 Muscle weakness (generalized): Secondary | ICD-10-CM

## 2023-04-05 DIAGNOSIS — M5459 Other low back pain: Secondary | ICD-10-CM

## 2023-04-05 NOTE — Therapy (Signed)
OUTPATIENT PHYSICAL THERAPY NEURO TREATMENT/AQUATIC THERAPY   Patient Name: Susan Hogan MRN: [id number] DOB:Aug 22, 1961, 62 y.o., female Today's Date: 04/05/2023   PCP: Ladora Daniel, PA-C REFERRING PROVIDER: Ernest Mallick, PA-C  END OF SESSION:  PT End of Session - 04/05/23 2013     Visit Number 4    Number of Visits 6    Date for PT Re-Evaluation 04/29/23    Authorization Type [employer] Assurance//[employer]    PT Start Time 1535    PT Stop Time 1630    PT Time Calculation (min) 55 min    Equipment Utilized During Treatment Other (comment)   small bar bells, pool noodle   Activity Tolerance Patient tolerated treatment well    Behavior During Therapy WFL for tasks assessed/performed             Past Medical History:  Diagnosis Date   Anemia    Arthritis    RA "states does not see Rheumatologist"   Asthma    Carpal tunnel syndrome    bilaterally   Complication of anesthesia    difficulty breathing   COPD (chronic obstructive pulmonary disease) (HCC)    COVID-19    Degenerative disk disease    back   Depression    Diabetes mellitus    Fibromyalgia    GERD (gastroesophageal reflux disease)    Headache(784.0)    migraines   Heart murmur    Hepatitis    In followup treatment for hepatitis C   Hypertension    no medication for at this time, sees Dr. Estevan Oaks Pcp   Hypotensive episode    hx of   Irritable bowel syndrome    Kidney stone    with hematuria   Memory loss    Migraines    Recurrent upper respiratory infection (URI)    Shortness of breath    Syncope    Past Surgical History:  Procedure Laterality Date   CHOLECYSTECTOMY  2019   DILATION AND CURETTAGE OF UTERUS     Left knee surgery     LIVER BIOPSY     TONSILLECTOMY     TUBAL LIGATION     Patient Active Problem List   Diagnosis Date Noted   UTI (urinary tract infection) 02/20/2020   TIA (transient ischemic attack) 02/19/2020   Sinus bradycardia 02/19/2020    Chronic migraine without aura, with intractable migraine, so stated, with status migrainosus 03/01/2019   Medication overuse headache 03/01/2019   Seasonal allergic rhinitis due to pollen 03/25/2018   Adnexal cyst 03/25/2018   Morbid obesity (HCC) 03/25/2018   HTN (hypertension) 03/25/2018   Pure hypercholesterolemia 03/25/2018   Sleep-disordered breathing 03/25/2018   Vitamin D deficiency 03/25/2018   Bilateral lower extremity edema 03/25/2018   Acute meniscal tear of left knee 11/24/2016   Chronic hepatitis C without hepatic coma (HCC) 11/24/2016   Fatty liver 11/24/2016   H/O degenerative disc disease 11/24/2016   Asthma-COPD overlap syndrome 10/22/2016   Diabetes mellitus (HCC) 07/25/2014   Fibromyalgia 07/25/2014   MDD (major depressive disorder), recurrent severe, without psychosis (HCC) 03/17/2013   Hepatitis C 08/02/2007   IBS (irritable bowel syndrome) 08/02/2007   Mild persistent asthma with acute exacerbation 09/21/1962    ONSET DATE: 3 years ago  REFERRING DIAG:  R26.89 (ICD-10-CM) - Other abnormalities of gait and mobility    THERAPY DIAG:  Other low back pain  Muscle weakness (generalized)  Rationale for Evaluation and Treatment: Rehabilitation  SUBJECTIVE:  SUBJECTIVE STATEMENT: Pt reports she felt really good after previous pool session 2 weeks ago - states she went home, took a shower and fell asleep; rested well due to decreased pain  Pt accompanied by: self  PERTINENT HISTORY: Past medical history significant for asthma/COPD  PAIN:  (start of session 04-05-23)  Are you having pain? Yes: NPRS scale: 7/10 Pain location: low back and legs Pain description: sore, tight, achy Aggravating factors: walking, standing prolonged periods Relieving factors:  heat  PRECAUTIONS: None  WEIGHT BEARING RESTRICTIONS: No  FALLS: Has patient fallen in last 6 months? No  LIVING ENVIRONMENT: Lives with: lives with their family and lives with their daughter Lives in: House/apartment Stairs: Yes: External: 2 steps; HR present Has following equipment at home: Environmental consultant - 4 wheeled  PLOF: Independent  PATIENT GOALS: be able to stand/walk longer  OBJECTIVE:   DIAGNOSTIC FINDINGS:   COGNITION: Overall cognitive status: Within functional limits for tasks assessed   SENSATION: WFL  COORDINATION: WNL   EDEMA:  none   POSTURE: No Significant postural limitations  LOWER EXTREMITY ROM:     ROM WNL  LUMBAR SPINE ROM: Flexion: able to reach mid-patella Extension: to neutral Lateral flexion/rotation: [facility] but uncomfortable  LOWER EXTREMITY MMT:    Gross 4/5 BLE strength assessed in sitting  Trunk strength: 2+/5   FUNCTIONAL TESTS:  30 seconds chair stand test 6 minute walk test: 1,035 ft vitals pre-test: 96%, 64 bpm; post-test 96%, 83 bpm 30 sec chair stand test: 8 reps   Todays Treatment:  Aquatic therapy at [place] - pool temp 90 degrees  Patient seen for aquatic therapy today.  Treatment took place in water 3.6-4.0 feet deep depending upon activity.  Pt entered and exited the pool via stairs using rails.    Warm up:  Forwards walking x 18' x 4 laps, backwards walking x 4 laps, side stepping x 4 laps all without UE support  Stretching:  Standing with back against wall, hamstring stretch using noodle x 20 sec hold, progressing to gentle side to side movement for adductor stretching with hamstring stretching 10 reps each leg    Piriformis stretch RLE and LLE in seated position on bench in pool 20 sec hold 1 rep each   LE/core strengthening:  marching in place 10 reps with small bar bells:  sidestepping with squats with bar bells 18' x 4 reps; standing hip flexion, abduction and extension 5 reps each leg RLE and LLE with UE  support on bar bells Closed chain strengthening - pushing solid blue noodle down to floor from 90 degree hip and knee flexion for hip extensor and quad strengthening 10 reps; knee extension/flexion 10 reps with foot on noodle RLE and LLE  Pt reclined in supine position with yellow noodle for flotation with PT supporting also - pt performed bicycling LE's 20 reps each leg; hip abduction/adduction 20 reps with feet flexed 20 reps; bil. Knees to chest with extension 20 reps  Pt requires buoyancy of water for support for reduced fall risk and for unloading/reduced stress on joints (spine and B knees) as pt able to tolerate increased standing and ambulation in water compared to that on land; viscosity of water is needed for resistance for strengthening and current of water provides perturbations for challenge for balance training       PATIENT EDUCATION: Education details: aquatic exercises initiated; also educated pt on facilities in town that have a pool; pt states she may join [employer]; pt was informed of  [employer] and [employer] Person educated: Patient Education method: Explanation Education comprehension: verbalized understanding  HOME EXERCISE PROGRAM: TBD  GOALS: Goals reviewed with patient? Yes  SHORT TERM GOALS: Target date: 03/25/2023    Patient will be independent in HEP to improve functional outcomes  Baseline: Goal status: IN PROGRESS  2.  Teach-back strategies to improve household function without back pain limiting Baseline: reports unable to stand to cook meal Goal status: IN PROGRESS  3. Demo improved lumbar flexibility as evident by ability to forward flex and reach mid-shin to improve mobility and ADL tolerance  Baseline: mid-patella  Goal status: INITIAL   LONG TERM GOALS: Target date: 04/29/2023    Patient will be independent in HEP to improve functional outcomes with activities to include land and aquatic-based interventions Baseline:  Goal status:  INITIAL  2.  Manifest improved BLE strength per 12 reps on 30 second chair to stand test Baseline: 8 reps Goal status: INITIAL  3.  Demo improved activity/gait tolerance ambulating distance of 1,200 ft during Baseline: 1,035 ft Goal status: INITIAL    ASSESSMENT:  CLINICAL IMPRESSION: Aquatic PT session focused on gentle stretching, water walking and LE ROM in pain free range to pt's tolerance.  Pt reported pain in low back and Lt hip at 7/10 intensity at start of session and reported pain 5/10 during aquatic PT session.  Pt reported some low back pain with standing hip extension; pt was informed to perform with smaller ROM and pt reported that pain was significantly reduced with this modification.  Pt stayed after session completed and performed water walking independently for additional exercise with reduced pain in aquatic environment compared to that experienced on land.  Cont with POC.    OBJECTIVE IMPAIRMENTS: decreased activity tolerance, decreased endurance, difficulty walking, decreased strength, impaired flexibility, and pain.   ACTIVITY LIMITATIONS: bending, standing, and locomotion level  PARTICIPATION LIMITATIONS: meal prep, shopping, community activity, and exercise compliance  PERSONAL FACTORS: Age, Time since onset of injury/illness/exacerbation, and 1-2 comorbidities: PMH  are also affecting patient's functional outcome.   REHAB POTENTIAL: Good  CLINICAL DECISION MAKING: Stable/uncomplicated  EVALUATION COMPLEXITY: Low  PLAN:  PT FREQUENCY: 1x/week  PT DURATION: 6 weeks  PLANNED INTERVENTIONS: Therapeutic exercises, Therapeutic activity, Neuromuscular re-education, Balance training, Gait training, Patient/Family education, Self Care, Joint mobilization, Stair training, Vestibular training, Canalith repositioning, DME instructions, Aquatic Therapy, Dry Needling, Electrical stimulation, Spinal mobilization, Cryotherapy, Moist heat, Ionotophoresis 4mg /ml  Dexamethasone, and Manual therapy  PLAN FOR NEXT SESSION: land-based HEP for lumbar ROM/strength  Pool:  Focus on gentle strengthening, stretching (hamstrings, piriformis), sit<>stand from bench, step ups (forward/lateral), core strengthening     Kerry Fort, PT [facility] [address] [place], [place], [address] Phone: [phone]   Fax:  [phone] 04/05/23, 8:20 PM

## 2023-04-12 ENCOUNTER — Ambulatory Visit: Payer: Medicaid Other

## 2023-04-19 ENCOUNTER — Ambulatory Visit: Payer: Medicaid Other | Admitting: Physical Therapy

## 2023-05-12 ENCOUNTER — Ambulatory Visit: Payer: Medicaid Other | Attending: Physician Assistant

## 2023-05-12 DIAGNOSIS — M6281 Muscle weakness (generalized): Secondary | ICD-10-CM | POA: Insufficient documentation

## 2023-05-12 DIAGNOSIS — M5459 Other low back pain: Secondary | ICD-10-CM | POA: Insufficient documentation

## 2023-05-12 DIAGNOSIS — R29898 Other symptoms and signs involving the musculoskeletal system: Secondary | ICD-10-CM | POA: Insufficient documentation

## 2023-05-14 ENCOUNTER — Other Ambulatory Visit: Payer: Self-pay

## 2023-05-14 ENCOUNTER — Encounter: Payer: Self-pay | Admitting: Allergy

## 2023-05-14 ENCOUNTER — Ambulatory Visit: Payer: Medicaid Other | Admitting: Allergy

## 2023-05-14 VITALS — BP 144/92 | HR 77 | Temp 97.5°F | Resp 16 | Ht 59.0 in | Wt 211.2 lb

## 2023-05-14 DIAGNOSIS — J3089 Other allergic rhinitis: Secondary | ICD-10-CM

## 2023-05-14 DIAGNOSIS — J4489 Other specified chronic obstructive pulmonary disease: Secondary | ICD-10-CM | POA: Diagnosis not present

## 2023-05-14 DIAGNOSIS — H1013 Acute atopic conjunctivitis, bilateral: Secondary | ICD-10-CM | POA: Diagnosis not present

## 2023-05-14 DIAGNOSIS — J302 Other seasonal allergic rhinitis: Secondary | ICD-10-CM

## 2023-05-14 MED ORDER — OLOPATADINE HCL 0.2 % OP SOLN: 1.0000 [drp] | OPHTHALMIC | 5 refills | Status: DC

## 2023-05-14 MED ORDER — AZELASTINE-FLUTICASONE 137-50 MCG/ACT NA SUSP: 2.0000 | NASAL | 5 refills | Status: DC

## 2023-05-14 MED ORDER — FLUTICASONE PROPIONATE HFA 110 MCG/ACT IN AERO: 2.0000 | INHALATION_SPRAY | Freq: Two times a day (BID) | RESPIRATORY_TRACT | 5 refills | Status: DC

## 2023-05-14 MED ORDER — CETIRIZINE HCL 10 MG PO TABS: 10.0000 mg | ORAL_TABLET | Freq: Every day | ORAL | 5 refills | Status: DC | PRN

## 2023-05-14 NOTE — Patient Instructions (Signed)
-   Environmental allergy testing from May showed positive to dust mites, tree pollen, grass pollen, ragweed pollen - Stop Flonase as will replace with a combination spray - Continue with: Zyrtec (cetirizine) 10mg  tablet once daily as needed - Start taking:  Dymista (fluticasone/azelastine) two sprays per nostril 1-2 times daily as needed for runny or stuffy nose.  Pataday (olopatadine) one drop per eye daily as needed itchy/watery eyes.  - You can use an extra dose of the antihistamine, if needed, for breakthrough symptoms.  - Consider nasal saline rinses 1-2 times daily to remove allergens from the nasal cavities as well as help with mucous clearance (this is especially helpful to do before the nasal sprays are given) - Consider allergy shots as a means of long-term control. - Allergy shots "re-train" and "reset" the immune system to ignore environmental allergens and decrease the resulting immune response to those allergens (sneezing, itchy watery eyes, runny nose, nasal congestion, etc).    - Allergy shots improve symptoms in 75-85% of patients.  - We can discuss more at a future appointment if the medications are not working for you.  - Spacer sample and demonstration provided. - Stop Pulmicort - Daily controller medication(s): Flovent 2 puffs twice daily with spacer - Prior to physical activity: albuterol 2 puffs 10-15 minutes before physical activity. - Rescue medications: albuterol 2 puffs every 4-6 hours as needed - Changes during respiratory infections or worsening symptoms: Increase Flovent to 3 puffs three times daily for TWO WEEKS. - Asthma control goals:  * Full participation in all desired activities (may need albuterol before activity) * Albuterol use two time or less a week on average (not counting use with activity) * Cough interfering with sleep two time or less a month * Oral steroids no more than once a year * No hospitalizations  Follow-up in 3-4 months or sooner  if needed

## 2023-05-14 NOTE — Progress Notes (Signed)
New Patient Note  RE: Susan Hogan MRN: [id number] DOB: [birth year] Date of Office Visit: 05/14/2023  Primary care provider: Ladora Daniel, PA-C  Chief Complaint: asthma and allergies  History of present illness: Susan Hogan is a 62 y.o. female presenting today for evaluation of asthma and allergies.    She states she has had asthma since childhood.  She also COPD diagnosis from secondhand exposure is what she was told.  She has been following with Ames Dura, NP in pulmonology.  She states she moved to this area from [place] in 1993.  The humidity here is a big trigger for her.  Other triggers include going up stairs/activity, smoke exposure are triggers.  Symptoms include shortness of breath, wheeze, cough, chest tightness.  She has not had any hospitalizations for asthma flares.  In past year has had 2 ED visits for asthma symptoms and 1 visit to her PCP.  She has had prednisone with ED visits. She has an albuterol inhaler. She has been prescribed Pulmicort and states has been using as needed but states it didn't work when used as needed. She has used Breo in the past and broke out in rash like welts on arms and throat tightness that she discontinue use.  She states she has has symptoms to want to use inhaler weekly during the warm weather.   She has itchy/watery eyes, runny nose stuffy, sneezing.  Symptoms are seasonal and year-round.  She does use Flonase as needed for nasal congestion.  Review of systems: 10pt ROS negative unless noted above in HPI  Past medical history: Past Medical History:  Diagnosis Date   Anemia    Arthritis    RA "states does not see Rheumatologist"   Asthma    Carpal tunnel syndrome    bilaterally   Complication of anesthesia    difficulty breathing   COPD (chronic obstructive pulmonary disease) (HCC)    COVID-19    Degenerative disk disease    back   Depression    Diabetes mellitus    Fibromyalgia    GERD (gastroesophageal reflux  disease)    Headache(784.0)    migraines   Heart murmur    Hepatitis    In followup treatment for hepatitis C   Hypertension    no medication for at this time, sees Dr. Estevan Oaks Pcp   Hypotensive episode    hx of   Irritable bowel syndrome    Kidney stone    with hematuria   Memory loss    Migraines    Recurrent upper respiratory infection (URI)    Shortness of breath    Syncope     Past surgical history: Past Surgical History:  Procedure Laterality Date   CHOLECYSTECTOMY  2019   DILATION AND CURETTAGE OF UTERUS     Left knee surgery     LIVER BIOPSY     TONSILLECTOMY     TUBAL LIGATION      Family history:  Family History  Problem Relation Age of Onset   Heart disease Mother    Diabetes Mother    Asthma Mother    Anemia Mother    Cerebral aneurysm Mother        ruptured   Kidney failure Mother    Migraines Mother    Parkinson's disease Mother    Colon cancer Maternal Uncle    Heart disease Maternal Uncle    Heart disease Maternal Grandmother    Diabetes Maternal Grandmother    Asthma Maternal  Grandmother    Heart Problems Maternal Grandmother    Heart Problems Other        through family   Diabetes Granddaughter    Diabetes Daughter     Social history: Lives in a home without carpeting with electric heating and central cooling.  No pets in the home.  No concern for roaches in the home.  Not sure about water damage/mildew in the home.  She is retired.  Does not report smoking history.     Medication List: Current Outpatient Medications  Medication Sig Dispense Refill   albuterol (VENTOLIN HFA) 108 (90 Base) MCG/ACT inhaler Inhale 1-2 puffs into the lungs every 6 (six) hours as needed for wheezing or shortness of breath. 18 g 5   blood glucose meter kit and supplies KIT Dispense based on patient and insurance preference. Use up to four times daily as directed. (FOR ICD-9 250.00, 250.01). 1 each 0   cetirizine (ZYRTEC) 10 MG tablet Take 10 mg by  mouth daily as needed for allergies.      fluticasone (FLONASE) 50 MCG/ACT nasal spray Place 1 spray into both nostrils daily.     glucose blood (ACCU-CHEK ACTIVE STRIPS) test strip Use as instructed 100 each 12   metoprolol succinate (TOPROL-XL) 25 MG 24 hr tablet Take 25 mg by mouth as needed.      nitrofurantoin, macrocrystal-monohydrate, (MACROBID) 100 MG capsule Take 1 capsule (100 mg total) by mouth 2 (two) times daily. 10 capsule 0   olmesartan (BENICAR) 20 MG tablet Take 1 tablet (20 mg total) by mouth daily. 30 tablet 0   ondansetron (ZOFRAN-ODT) 8 MG disintegrating tablet Take 1 tablet (8 mg total) by mouth every 8 (eight) hours as needed for nausea. 20 tablet 0   pantoprazole (PROTONIX) 40 MG tablet Take 1 tablet (40 mg total) by mouth daily. 30 tablet 0   prochlorperazine (COMPAZINE) 10 MG tablet Take 10 mg by mouth every 6 (six) hours as needed for nausea or vomiting.      rizatriptan (MAXALT-MLT) 10 MG disintegrating tablet Take 10 mg by mouth 2 (two) times daily as needed for migraine.      sucralfate (CARAFATE) 1 g tablet Take 1 tablet (1 g total) by mouth 4 (four) times daily -  with meals and at bedtime. 90 tablet 0   No current facility-administered medications for this visit.    Known medication allergies: Allergies  Allergen Reactions   Anesthetic Ether [Ether] Shortness Of Breath    IV anesthesia causes extreme pain and tightness in back and ribcage.   Nubain [Nalbuphine Hcl] Anaphylaxis   Penicillins Hives and Shortness Of Breath    DID THE REACTION INVOLVE: Swelling of the face/tongue/throat, SOB, or low BP? Yes Sudden or severe rash/hives, skin peeling, or the inside of the mouth or nose? Yes Did it require medical treatment? Yes When did it last happen? About 3 years ago    If all above answers are "NO", may proceed with cephalosporin use.   Lisinopril Other (See Comments)    Pt states that this causes her bp to increase.   Advair Diskus [Fluticasone-Salmeterol]  Hives   Lactose Intolerance (Gi) Diarrhea   Tizanidine Palpitations and Other (See Comments)    "made my right side numb"     Physical examination: Blood pressure (!) 144/92, pulse 77, temperature (!) 97.5 F (36.4 C), resp. rate 16, height 4\' 11"  (1.499 m), weight 211 lb 3.2 oz (95.8 kg), SpO2 95%.  General: Alert, interactive, in  no acute distress. HEENT: PERRLA, TMs pearly gray, turbinates minimally edematous without discharge, post-pharynx non erythematous. Neck: Supple without lymphadenopathy. Lungs: Clear to auscultation without wheezing, rhonchi or rales. {no increased work of breathing. CV: Normal S1, S2 with faint systolic murmur. Abdomen: Nondistended, nontender. Skin: Warm and dry, without lesions or rashes. Extremities:  No clubbing, cyanosis or edema. Neuro:   Grossly intact.  Diagnositics/Labs: Labs: Component     Latest Ref Rng 02/09/2023  Allergen, D pternoyssinus,d7     kU/L 1.59 (H)   Class 4   Class 1   Class 3   Class 0   Class 0/1   Class 2   Class 0/1   Class 2   Class 0   Class 0   Class 0   Class 2   Class 2   Class 0   Class 0   Class 0   Class 0   Class 0   Class 0   Class 0   Class 0/1   Class 3   Class 0/1   Class 0   D. farinae     kU/L 2.05 (H)   Allergen, P. notatum, m1     kU/L <0.10   CLADOSPORIUM HERBARUM (M2) IGE     kU/L <0.10   Aspergillus fumigatus, m3     kU/L <0.10   Allergen, A. alternata, m6     kU/L <0.10   Cat Dander     kU/L <0.10   Dog Dander     kU/L <0.10   Cockroach     kU/L <0.10   Box Elder IgE     kU/L 0.16 (H)   Allergen, Comm Silver Charletta Cousin, t9     kU/L 7.85 (H)   Allergen, Cedar tree, t12     kU/L 0.14 (H)   Allergen, Cottonwood, t14     kU/L <0.10   Allergen, Oak,t7     kU/L 24.50 (H)   Elm IgE     kU/L 0.39 (H)   Pecan/Hickory Tree IgE     kU/L 3.56 (H)   Allergen, Mulberry, t76     kU/L <0.10   [place] Grass     kU/L 0.22 (H)   Timothy Grass     kU/L 0.70 (H)   Johnson Grass      kU/L 0.17 (H)   COMMON RAGWEED (SHORT) (W1) IGE     kU/L 1.49 (H)   Rough Pigweed  IgE     kU/L <0.10   Sheep Sorrel IgE     kU/L <0.10   Allergen, Mouse Urine Protein, e78     kU/L <0.10   IgE (Immunoglobulin E), Serum     <OR=114 kU/L 112   WBC     4.0 - 10.5 K/uL 9.4   RBC     3.87 - 5.11 Mil/uL 3.97   Hemoglobin     12.0 - 15.0 g/dL 40.9   HCT     81.1 - 91.4 % 38.0   MCV     78.0 - 100.0 fl 95.8   MCHC     30.0 - 36.0 g/dL 78.2   RDW     95.6 - 21.3 % 14.1   Platelets     150.0 - 400.0 K/uL 191.0   Neutrophils     43.0 - 77.0 % 68.2   Lymphocytes     12.0 - 46.0 % 21.5   Monocytes Relative     3.0 - 12.0 % 7.3   Eosinophil  0.0 - 5.0 % 1.8   Basophil     0.0 - 3.0 % 1.2   NEUT#     1.4 - 7.7 K/uL 6.4   Lymphocyte #     0.7 - 4.0 K/uL 2.0   Monocyte #     0.1 - 1.0 K/uL 0.7   Eosinophils Absolute     0.0 - 0.7 K/uL 0.2   Basophils Absolute     0.0 - 0.1 K/uL 0.1      Spirometry: FEV1: 1.83L 98%, FVC: 1.96L 84%, ratio consistent with nonobstructive pattern  Assessment and plan: Allergic rhinitis with conjunctivitis  - Environmental allergy testing from May showed positive to dust mites, tree pollen, grass pollen, ragweed pollen - Stop Flonase as will replace with a combination spray - Continue with: Zyrtec (cetirizine) 10mg  tablet once daily as needed - Start taking:  Dymista (fluticasone/azelastine) two sprays per nostril 1-2 times daily as needed for runny or stuffy nose.  Pataday (olopatadine) one drop per eye daily as needed itchy/watery eyes.  - You can use an extra dose of the antihistamine, if needed, for breakthrough symptoms.  - Consider nasal saline rinses 1-2 times daily to remove allergens from the nasal cavities as well as help with mucous clearance (this is especially helpful to do before the nasal sprays are given) - Consider allergy shots as a means of long-term control. - Allergy shots "re-train" and "reset" the immune system to  ignore environmental allergens and decrease the resulting immune response to those allergens (sneezing, itchy watery eyes, runny nose, nasal congestion, etc).    - Allergy shots improve symptoms in 75-85% of patients.  - We can discuss more at a future appointment if the medications are not working for you.  Asthma with COPD overlap - Spacer sample and demonstration provided. - Stop Pulmicort - Daily controller medication(s): Flovent 2 puffs twice daily with spacer - Prior to physical activity: albuterol 2 puffs 10-15 minutes before physical activity. - Rescue medications: albuterol 2 puffs every 4-6 hours as needed - Changes during respiratory infections or worsening symptoms: Increase Flovent to 3 puffs three times daily for TWO WEEKS. - Asthma control goals:  * Full participation in all desired activities (may need albuterol before activity) * Albuterol use two time or less a week on average (not counting use with activity) * Cough interfering with sleep two time or less a month * Oral steroids no more than once a year * No hospitalizations  Follow-up in 3-4 months or sooner if needed   I appreciate the opportunity to take part in Verania's care. Please do not hesitate to contact me with questions.  Sincerely,   Margo Aye, MD Allergy/Immunology [facility]

## 2023-05-17 ENCOUNTER — Ambulatory Visit: Payer: No Typology Code available for payment source
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
## 2023-05-19 ENCOUNTER — Ambulatory Visit: Payer: Medicaid Other | Admitting: Physical Therapy

## 2023-05-19 ENCOUNTER — Encounter: Payer: Self-pay | Admitting: Physical Therapy

## 2023-05-19 ENCOUNTER — Telehealth: Payer: Self-pay | Admitting: Physical Therapy

## 2023-05-19 DIAGNOSIS — M5459 Other low back pain: Secondary | ICD-10-CM | POA: Diagnosis present

## 2023-05-19 DIAGNOSIS — R29898 Other symptoms and signs involving the musculoskeletal system: Secondary | ICD-10-CM

## 2023-05-19 DIAGNOSIS — M6281 Muscle weakness (generalized): Secondary | ICD-10-CM

## 2023-05-19 NOTE — Telephone Encounter (Signed)
Hello,     Ms. Mcclaine was seen in OPPT today per your referral. She reported new c/o radicular midback pain and B LE edema. Vitals also revealed HTN at rest (163/105 mmHg, 97%, 75 bpm) despite report of being compliant with her medications. Advise that she be seen in your office before proceeding with PT d/t these new concerns. Please advise if any questions.    Thanks!  Anette Guarneri, PT, DPT

## 2023-06-09 ENCOUNTER — Encounter (HOSPITAL_COMMUNITY): Payer: Self-pay | Admitting: Emergency Medicine

## 2023-06-09 ENCOUNTER — Other Ambulatory Visit: Payer: Self-pay

## 2023-06-09 ENCOUNTER — Emergency Department (HOSPITAL_COMMUNITY)
Admission: EM | Admit: 2023-06-09 | Discharge: 2023-06-09 | Disposition: A | Discharge status: Home / Self Care | Payer: Medicaid Other | Attending: Emergency Medicine | Admitting: Emergency Medicine

## 2023-06-09 ENCOUNTER — Emergency Department (HOSPITAL_COMMUNITY): Payer: Medicaid Other

## 2023-06-09 DIAGNOSIS — J45909 Unspecified asthma, uncomplicated: Secondary | ICD-10-CM | POA: Insufficient documentation

## 2023-06-09 DIAGNOSIS — R0602 Shortness of breath: Secondary | ICD-10-CM | POA: Insufficient documentation

## 2023-06-09 DIAGNOSIS — Z20822 Contact with and (suspected) exposure to covid-19: Secondary | ICD-10-CM | POA: Diagnosis not present

## 2023-06-09 DIAGNOSIS — Z7951 Long term (current) use of inhaled steroids: Secondary | ICD-10-CM | POA: Insufficient documentation

## 2023-06-09 DIAGNOSIS — R7401 Elevation of levels of liver transaminase levels: Secondary | ICD-10-CM | POA: Insufficient documentation

## 2023-06-09 DIAGNOSIS — R531 Weakness: Secondary | ICD-10-CM | POA: Diagnosis not present

## 2023-06-09 DIAGNOSIS — I10 Essential (primary) hypertension: Secondary | ICD-10-CM | POA: Insufficient documentation

## 2023-06-09 DIAGNOSIS — E1165 Type 2 diabetes mellitus with hyperglycemia: Secondary | ICD-10-CM | POA: Diagnosis not present

## 2023-06-09 DIAGNOSIS — R300 Dysuria: Secondary | ICD-10-CM | POA: Diagnosis present

## 2023-06-09 DIAGNOSIS — N3090 Cystitis, unspecified without hematuria: Secondary | ICD-10-CM | POA: Diagnosis not present

## 2023-06-09 DIAGNOSIS — J449 Chronic obstructive pulmonary disease, unspecified: Secondary | ICD-10-CM | POA: Insufficient documentation

## 2023-06-09 DIAGNOSIS — Z79899 Other long term (current) drug therapy: Secondary | ICD-10-CM | POA: Diagnosis not present

## 2023-06-09 LAB — CBC WITH DIFFERENTIAL/PLATELET
Abs Immature Granulocytes: 0.08 10*3/uL — ABNORMAL HIGH (ref 0.00–0.07)
Basophils Absolute: 0.1 10*3/uL (ref 0.0–0.1)
Basophils Relative: 1 %
Eosinophils Absolute: 0.2 10*3/uL (ref 0.0–0.5)
Eosinophils Relative: 2 %
HCT: 34.6 % — ABNORMAL LOW (ref 36.0–46.0)
Hemoglobin: 11.9 g/dL — ABNORMAL LOW (ref 12.0–15.0)
Immature Granulocytes: 1 %
Lymphocytes Relative: 30 %
Lymphs Abs: 3 10*3/uL (ref 0.7–4.0)
MCH: 32.7 pg (ref 26.0–34.0)
MCHC: 34.4 g/dL (ref 30.0–36.0)
MCV: 95.1 fL (ref 80.0–100.0)
Monocytes Absolute: 0.8 10*3/uL (ref 0.1–1.0)
Monocytes Relative: 8 %
Neutro Abs: 5.7 10*3/uL (ref 1.7–7.7)
Neutrophils Relative %: 58 %
Platelets: 165 10*3/uL (ref 150–400)
RBC: 3.64 MIL/uL — ABNORMAL LOW (ref 3.87–5.11)
RDW: 13.8 % (ref 11.5–15.5)
WBC: 9.8 10*3/uL (ref 4.0–10.5)
nRBC: 0 % (ref 0.0–0.2)

## 2023-06-09 LAB — COMPREHENSIVE METABOLIC PANEL WITH GFR
ALT: 97 U/L — ABNORMAL HIGH (ref 0–44)
AST: 103 U/L — ABNORMAL HIGH (ref 15–41)
Albumin: 3.1 g/dL — ABNORMAL LOW (ref 3.5–5.0)
Alkaline Phosphatase: 80 U/L (ref 38–126)
Anion gap: 8 (ref 5–15)
BUN: 19 mg/dL (ref 8–23)
CO2: 22 mmol/L (ref 22–32)
Calcium: 8.3 mg/dL — ABNORMAL LOW (ref 8.9–10.3)
Chloride: 107 mmol/L (ref 98–111)
Creatinine, Ser: 0.86 mg/dL (ref 0.44–1.00)
GFR, Estimated: >60 mL/min (ref >60)
Glucose, Bld: 136 mg/dL — ABNORMAL HIGH (ref 70–99)
Potassium: 3.6 mmol/L (ref 3.5–5.1)
Sodium: 137 mmol/L (ref 135–145)
Total Bilirubin: 0.4 mg/dL (ref 0.3–1.2)
Total Protein: 5.8 g/dL — ABNORMAL LOW (ref 6.5–8.1)

## 2023-06-09 LAB — URINALYSIS, ROUTINE W REFLEX MICROSCOPIC
Bilirubin Urine: NEGATIVE
Glucose, UA: NEGATIVE mg/dL
Ketones, ur: NEGATIVE mg/dL
Nitrite: NEGATIVE
Protein, ur: 100 mg/dL — AB
Specific Gravity, Urine: 1.009 (ref 1.005–1.030)
pH: 5 (ref 5.0–8.0)

## 2023-06-09 LAB — SARS CORONAVIRUS 2 BY RT PCR: SARS Coronavirus 2 by RT PCR: NEGATIVE

## 2023-06-09 MED ORDER — ACETAMINOPHEN 500 MG PO TABS
1000.0000 mg | ORAL_TABLET | Freq: Four times a day (QID) | ORAL | Status: DC | PRN
  Administered 2023-06-09: 1000 mg via ORAL
  Filled 2023-06-09: qty 2

## 2023-06-09 MED ORDER — AMLODIPINE BESYLATE 5 MG PO TABS
10.0000 mg | ORAL_TABLET | Freq: Once | ORAL | Status: AC
  Administered 2023-06-09: 10 mg via ORAL
  Filled 2023-06-09: qty 2

## 2023-06-09 MED ORDER — CEPHALEXIN 500 MG PO CAPS: 500.0000 mg | ORAL_CAPSULE | Freq: Four times a day (QID) | ORAL | 0 refills | Status: DC

## 2023-06-09 NOTE — ED Triage Notes (Signed)
Pt c/o dysuria, body aches, intermittent SHOB and chills x 2 days.

## 2023-06-09 NOTE — ED Notes (Signed)
Advised pt we need a urine specimen provided pt with a cup

## 2023-06-09 NOTE — Discharge Instructions (Signed)
It was a pleasure taking part in your care today.  As we discussed, workup reassuring.  I believe that you have a UTI so I am sending you home on Keflex.  You will take 500 mg Keflex 4 times daily for the next 7 days.  Please follow-up with your PCP for reevaluation.  Please return to the ED with any new or worsening symptoms such as nausea, vomiting, abdominal pain, fevers, worsening painful urination.  Please continue drink plenty of fluids.

## 2023-06-09 NOTE — ED Provider Notes (Signed)
[place] EMERGENCY DEPARTMENT AT [facility] Provider Note   CSN: [id number] Arrival date & time: 06/09/23  0007     History  Chief Complaint  Patient presents with   Dysuria    Susan Hogan is a 62 y.o. female with medical history of MDD, hepatitis C, IBS, diabetes, fibromyalgia, asthma COPD, fatty liver, asthma, hypertension, vitamin D deficiency.  Patient presents to ED for evaluation of whole body pain, dysuria, body aches and chills, shortness of breath.  Patient reports that 3 days ago she was at [facility] when she had a sudden onset of whole body aches associated with weakness.  States that she became so weak at 1 point she had to hold onto her cart in order to not fall over.  States that this resolved and she went home that night.  States that over the last 2 days she has had intermittent whole body pain, itching at the end of her urine stream.  Denies vaginal discharge, fevers, nausea, vomiting, diarrhea, abdominal pain, chest pain.  She is also endorsing shortness of breath which she states began tonight here in the ED.  She is also complaining of high blood pressure, states she takes 10 mg amlodipine for her blood pressure, did take it last night.  She is denying blurred vision but endorsing a headache.   Dysuria Associated symptoms: no fever, no nausea, no vaginal discharge and no vomiting        Home Medications Prior to Admission medications   Medication Sig Start Date End Date Taking? Authorizing Provider  albuterol (VENTOLIN HFA) 108 (90 Base) MCG/ACT inhaler Inhale 1-2 puffs into the lungs every 6 (six) hours as needed for wheezing or shortness of breath. 02/10/23  Yes Glenford Bayley, NP  amLODipine (NORVASC) 5 MG tablet Take 5 mg by mouth daily. 05/12/23  Yes [provider]  Azelastine-Fluticasone (DYMISTA) 137-50 MCG/ACT SUSP Place 2 sprays into both nostrils 1 day or 1 dose. 05/14/23  Yes Padgett, Pilar Grammes, MD  cephALEXin (KEFLEX) 500  MG capsule Take 1 capsule (500 mg total) by mouth 4 (four) times daily. 06/09/23  Yes Al Decant, PA-C  cyclobenzaprine (FLEXERIL) 10 MG tablet Take 10 mg by mouth 3 (three) times daily. 04/19/23  Yes [provider]  fluticasone (FLONASE) 50 MCG/ACT nasal spray Place 1 spray into both nostrils daily as needed for allergies. 12/19/19  Yes [provider]  fluticasone (FLOVENT HFA) 110 MCG/ACT inhaler Inhale 2 puffs into the lungs 2 (two) times daily. 05/14/23  Yes Padgett, Pilar Grammes, MD  blood glucose meter kit and supplies KIT Dispense based on patient and insurance preference. Use up to four times daily as directed. (FOR ICD-9 250.00, 250.01). 03/25/18   Helane Rima, DO  cetirizine (ZYRTEC) 10 MG tablet Take 1 tablet (10 mg total) by mouth daily as needed for allergies. Patient not taking: Reported on 06/09/2023 05/14/23   Marcelyn Bruins, MD  glucose blood [facility] ACTIVE STRIPS) test strip Use as instructed 04/15/16   Barrett Henle, PA-C  loratadine (CLARITIN) 10 MG tablet Take by mouth. Patient not taking: Reported on 06/09/2023 04/19/23   [provider]  nitrofurantoin, macrocrystal-monohydrate, (MACROBID) 100 MG capsule Take 1 capsule (100 mg total) by mouth 2 (two) times daily. Patient not taking: Reported on 06/09/2023 02/14/22   Pricilla Loveless, MD  olmesartan (BENICAR) 20 MG tablet Take 1 tablet (20 mg total) by mouth daily. Patient not taking: Reported on 06/09/2023 05/26/22   Cardama, Amadeo Garnet,  MD  Olopatadine HCl (PATADAY) 0.2 % SOLN Place 1 drop into both eyes 1 day or 1 dose. Patient not taking: Reported on 06/09/2023 05/14/23   Marcelyn Bruins, MD  ondansetron (ZOFRAN-ODT) 8 MG disintegrating tablet Take 1 tablet (8 mg total) by mouth every 8 (eight) hours as needed for nausea. Patient not taking: Reported on 06/09/2023 02/14/22   Pricilla Loveless, MD  pantoprazole (PROTONIX) 40 MG tablet Take 1 tablet (40 mg total)  by mouth daily. Patient not taking: Reported on 06/09/2023 02/14/22   Pricilla Loveless, MD  sucralfate (CARAFATE) 1 g tablet Take 1 tablet (1 g total) by mouth 4 (four) times daily -  with meals and at bedtime. Patient not taking: Reported on 06/09/2023 02/14/22   Pricilla Loveless, MD  dicyclomine (BENTYL) 20 MG tablet Take 1 tablet (20 mg total) by mouth 2 (two) times daily. Patient not taking: Reported on 10/01/2019 08/19/19 10/01/19  Anselm Pancoast, PA-C  diphenhydrAMINE (BENADRYL) 25 MG tablet Take 1 tablet (25 mg total) by mouth every 6 (six) hours as needed. Patient not taking: Reported on 10/01/2019 09/24/19 10/01/19  Petrucelli, Lelon Mast R, PA-C  escitalopram (LEXAPRO) 10 MG tablet TAKE 1 TABLET(10 MG) BY MOUTH DAILY Patient not taking: Reported on 07/05/2018 06/01/18 09/24/19  Helane Rima, DO  labetalol (NORMODYNE) 100 MG tablet Take 1 tablet (100 mg total) by mouth 2 (two) times daily. Patient not taking: Reported on 10/01/2019 08/30/19 10/01/19  Bethann Berkshire, MD  topiramate (TOPAMAX) 100 MG tablet Take 1 tablet (100 mg total) by mouth at bedtime. Patient not taking: Reported on 10/01/2019 03/09/19 10/01/19  Anson Fret, MD      Allergies    Anesthetic ether [ether], Nubain [nalbuphine hcl], Penicillins, Lisinopril, Advair diskus [fluticasone-salmeterol], Lactose intolerance (gi), and Tizanidine    Review of Systems   Review of Systems  Constitutional:  Positive for chills. Negative for fever.  Respiratory:  Positive for shortness of breath.   Cardiovascular:  Negative for chest pain.  Gastrointestinal:  Negative for diarrhea, nausea and vomiting.  Genitourinary:  Positive for dysuria. Negative for vaginal discharge.  Musculoskeletal:  Positive for myalgias.  All other systems reviewed and are negative.   Physical Exam Updated Vital Signs BP (!) 159/86   Pulse (!) 59   Temp 97.6 F (36.4 C) (Oral)   Resp 16   Ht 4\' 11"  (1.499 m)   Wt 95.8 kg   LMP  (LMP Unknown)   SpO2 98%   BMI  42.66 kg/m  Physical Exam Vitals and nursing note reviewed.  Constitutional:      General: She is not in acute distress.    Appearance: Normal appearance. She is not ill-appearing, toxic-appearing or diaphoretic.  HENT:     Head: Normocephalic and atraumatic.     Nose: Nose normal.     Mouth/Throat:     Mouth: Mucous membranes are moist.     Pharynx: Oropharynx is clear.  Eyes:     Extraocular Movements: Extraocular movements intact.     Conjunctiva/sclera: Conjunctivae normal.     Pupils: Pupils are equal, round, and reactive to light.  Cardiovascular:     Rate and Rhythm: Normal rate and regular rhythm.  Pulmonary:     Effort: Pulmonary effort is normal.     Breath sounds: Normal breath sounds. No wheezing.  Abdominal:     General: Abdomen is flat. Bowel sounds are normal.     Palpations: Abdomen is soft.     Tenderness: There is no  abdominal tenderness. There is no right CVA tenderness or left CVA tenderness.  Musculoskeletal:     Cervical back: Normal range of motion and neck supple. No tenderness.  Skin:    General: Skin is warm and dry.     Capillary Refill: Capillary refill takes less than 2 seconds.  Neurological:     General: No focal deficit present.     Mental Status: She is alert and oriented to person, place, and time.     GCS: GCS eye subscore is 4. GCS verbal subscore is 5. GCS motor subscore is 6.     Cranial Nerves: Cranial nerves 2-12 are intact. No cranial nerve deficit.     Sensory: Sensation is intact. No sensory deficit.     Motor: Motor function is intact. No weakness.     Coordination: Coordination is intact. Heel to [facility] Test normal.     ED Results / Procedures / Treatments   Labs (all labs ordered are listed, but only abnormal results are displayed) Labs Reviewed  CBC WITH DIFFERENTIAL/PLATELET - Abnormal; Notable for the following components:      Result Value   RBC 3.64 (*)    Hemoglobin 11.9 (*)    HCT 34.6 (*)    Abs Immature  Granulocytes 0.08 (*)    All other components within normal limits  COMPREHENSIVE METABOLIC PANEL - Abnormal; Notable for the following components:   Glucose, Bld 136 (*)    Calcium 8.3 (*)    Total Protein 5.8 (*)    Albumin 3.1 (*)    AST 103 (*)    ALT 97 (*)    All other components within normal limits  URINALYSIS, ROUTINE W REFLEX MICROSCOPIC - Abnormal; Notable for the following components:   Hgb urine dipstick MODERATE (*)    Protein, ur 100 (*)    Leukocytes,Ua MODERATE (*)    Bacteria, UA RARE (*)    All other components within normal limits  SARS CORONAVIRUS 2 BY RT PCR  URINE CULTURE    EKG None  Radiology DG Chest Portable 1 View  Result Date: 06/09/2023 CLINICAL DATA:  Shortness of breath EXAM: PORTABLE CHEST 1 VIEW COMPARISON:  12/26/2022 FINDINGS: Cardiac shadow is within normal limits. The lungs are well aerated bilaterally. No focal infiltrate or effusion is seen. No bony abnormality is noted. IMPRESSION: No acute abnormality noted. Electronically Signed   By: Alcide Clever M.D.   On: 06/09/2023 03:51    Procedures Procedures    Medications Ordered in ED Medications  acetaminophen (TYLENOL) tablet 1,000 mg (1,000 mg Oral Given 06/09/23 0342)  amLODipine (NORVASC) tablet 10 mg (10 mg Oral Given 06/09/23 0342)    ED Course/ Medical Decision Making/ A&P  Medical Decision Making Amount and/or Complexity of Data Reviewed Labs: ordered.   62 year old female presents to the ED for evaluation.  Please see HPI for further details.  On examination the patient is afebrile, nontachycardic.  Lung sounds are clear bilaterally, she is not hypoxic.  Abdomen is soft and compressible throughout.  Negative CVA tenderness bilaterally.  Neurological examination at baseline.  Patient CBC without leukocytosis, baseline hemoglobin 11.9.  The patient metabolic panel shows no electrolyte derangement, glucose 136, AST and ALT elevated at 103 and 97 respectively.  Patient has  history of elevated AST, ALT. Urinalysis shows moderate leukocytes, protein, hemoglobin, red bacteria.  Will culture patient urine.  Will send patient home on Keflex for suspected UTI.  Patient chest x-ray unremarkable, oxygen saturation are 98%.  Patient  EKG is nonischemic.  Patient was given Tylenol for headache.  Given 10 mg amlodipine for high blood pressure.  On reassessment, patient blood pressure has decreased.  Her headache is also decreased.  At this time, will send patient home with Keflex for UTI.  Will have patient follow-up with PCP.  Have encouraged the patient to return to the ED with any new or worsening signs or symptoms and she has voiced understanding.  All questions answered to the patient satisfaction.  Stable to discharge over this time.  Final Clinical Impression(s) / ED Diagnoses Final diagnoses:  Cystitis    Rx / DC Orders ED Discharge Orders          Ordered    cephALEXin (KEFLEX) 500 MG capsule  4 times daily        06/09/23 0443              Al Decant, PA-C 06/09/23 0444    Shon Baton, MD 06/10/23 [phone]

## 2023-06-11 LAB — URINE CULTURE: Culture: NO GROWTH

## 2023-06-14 ENCOUNTER — Telehealth: Payer: Self-pay | Admitting: Pulmonary Disease

## 2023-06-14 NOTE — Telephone Encounter (Signed)
Called patient to get her scheduled for her OV & PFT. Pt staes she is claustrophobic and will not be able to complete the test.

## 2023-06-17 NOTE — Telephone Encounter (Signed)
Attempted call vm box full We can still perform PFT without closing the door,

## 2023-08-11 ENCOUNTER — Encounter: Payer: Self-pay | Admitting: Pulmonary Disease

## 2023-08-11 ENCOUNTER — Ambulatory Visit: Payer: Medicaid Other | Admitting: Pulmonary Disease

## 2023-08-11 VITALS — BP 158/81 | HR 70 | Ht 59.0 in | Wt 210.0 lb

## 2023-08-11 DIAGNOSIS — J4531 Mild persistent asthma with (acute) exacerbation: Secondary | ICD-10-CM

## 2023-08-11 MED ORDER — PREDNISONE 20 MG PO TABS: 40.0000 mg | ORAL_TABLET | Freq: Every day | ORAL | 0 refills | Status: AC

## 2023-08-11 MED ORDER — METHYLPREDNISOLONE ACETATE 80 MG/ML IJ SUSP
120.0000 mg | Freq: Once | INTRAMUSCULAR | Status: AC
Start: 2023-08-11 — End: 2023-08-11
  Administered 2023-08-11: 120 mg via INTRAMUSCULAR

## 2023-08-11 MED ORDER — METHYLPREDNISOLONE SODIUM SUCC 40 MG IJ SOLR
120.0000 mg | Freq: Once | INTRAMUSCULAR | Status: DC
Start: 2023-08-11 — End: 2023-08-11

## 2023-08-11 NOTE — Progress Notes (Signed)
Susan Hogan    [id number]    Jul 21, 1961  Primary Care Physician:Beal, Christa See  Referring Physician: Ladora Daniel, PA-C [address] [place],  [place] [address]  Chief complaint:   Asthma with uncontrolled symptoms  HPI:  Patient with asthma  Recently on Flovent  Has been having more shortness of breath, waking up to use albuterol Using albuterol up to 3 times a day  Nasal stuffiness and congestion, some bleeding from the nose as well  Inhalers that she is used in the past include Symbicort, Breo  She does use Flonase  Usually a steroid shot may help her get past symptoms  History of asthma COPD overlap Exposure to secondhand smoke in the past   Outpatient Encounter Medications as of 08/11/2023  Medication Sig   albuterol (VENTOLIN HFA) 108 (90 Base) MCG/ACT inhaler Inhale 1-2 puffs into the lungs every 6 (six) hours as needed for wheezing or shortness of breath.   amLODipine (NORVASC) 5 MG tablet Take 5 mg by mouth daily.   Azelastine-Fluticasone (DYMISTA) 137-50 MCG/ACT SUSP Place 2 sprays into both nostrils 1 day or 1 dose.   blood glucose meter kit and supplies KIT Dispense based on patient and insurance preference. Use up to four times daily as directed. (FOR ICD-9 250.00, 250.01).   cephALEXin (KEFLEX) 500 MG capsule Take 1 capsule (500 mg total) by mouth 4 (four) times daily.   cetirizine (ZYRTEC) 10 MG tablet Take 1 tablet (10 mg total) by mouth daily as needed for allergies.   cyclobenzaprine (FLEXERIL) 10 MG tablet Take 10 mg by mouth 3 (three) times daily.   fluticasone (FLONASE) 50 MCG/ACT nasal spray Place 1 spray into both nostrils daily as needed for allergies.   fluticasone (FLOVENT HFA) 110 MCG/ACT inhaler Inhale 2 puffs into the lungs 2 (two) times daily.   glucose blood (ACCU-CHEK ACTIVE STRIPS) test strip Use as instructed   loratadine (CLARITIN) 10 MG tablet Take by mouth.   nitrofurantoin, macrocrystal-monohydrate, (MACROBID) 100  MG capsule Take 1 capsule (100 mg total) by mouth 2 (two) times daily.   olmesartan (BENICAR) 20 MG tablet Take 1 tablet (20 mg total) by mouth daily.   Olopatadine HCl (PATADAY) 0.2 % SOLN Place 1 drop into both eyes 1 day or 1 dose.   ondansetron (ZOFRAN-ODT) 8 MG disintegrating tablet Take 1 tablet (8 mg total) by mouth every 8 (eight) hours as needed for nausea.   pantoprazole (PROTONIX) 40 MG tablet Take 1 tablet (40 mg total) by mouth daily.   predniSONE (DELTASONE) 20 MG tablet Take 2 tablets (40 mg total) by mouth daily with breakfast for 5 days.   sucralfate (CARAFATE) 1 g tablet Take 1 tablet (1 g total) by mouth 4 (four) times daily -  with meals and at bedtime.   [DISCONTINUED] dicyclomine (BENTYL) 20 MG tablet Take 1 tablet (20 mg total) by mouth 2 (two) times daily. (Patient not taking: Reported on 10/01/2019)   [DISCONTINUED] diphenhydrAMINE (BENADRYL) 25 MG tablet Take 1 tablet (25 mg total) by mouth every 6 (six) hours as needed. (Patient not taking: Reported on 10/01/2019)   [DISCONTINUED] escitalopram (LEXAPRO) 10 MG tablet TAKE 1 TABLET(10 MG) BY MOUTH DAILY (Patient not taking: Reported on 07/05/2018)   [DISCONTINUED] labetalol (NORMODYNE) 100 MG tablet Take 1 tablet (100 mg total) by mouth 2 (two) times daily. (Patient not taking: Reported on 10/01/2019)   [DISCONTINUED] topiramate (TOPAMAX) 100 MG tablet Take 1 tablet (100 mg total)  by mouth at bedtime. (Patient not taking: Reported on 10/01/2019)   Facility-Administered Encounter Medications as of 08/11/2023  Medication   methylPREDNISolone sodium succinate (SOLU-MEDROL) 40 mg/mL injection 120 mg    Allergies as of 08/11/2023 - Review Complete 08/11/2023  Allergen Reaction Noted   Anesthetic ether [ether] Shortness Of Breath 11/25/2011   Nubain [nalbuphine hcl] Anaphylaxis 06/14/2012   Penicillins Hives and Shortness Of Breath    Lisinopril Other (See Comments) 12/19/2018   Advair diskus [fluticasone-salmeterol] Hives  03/16/2013   Lactose intolerance (gi) Diarrhea 11/24/2013   Tizanidine Palpitations and Other (See Comments) 02/19/2020    Past Medical History:  Diagnosis Date   Anemia    Arthritis    RA "states does not see Rheumatologist"   Asthma    Carpal tunnel syndrome    bilaterally   Complication of anesthesia    difficulty breathing   COPD (chronic obstructive pulmonary disease) (HCC)    COVID-19    Degenerative disk disease    back   Depression    Diabetes mellitus    Fibromyalgia    GERD (gastroesophageal reflux disease)    Headache(784.0)    migraines   Heart murmur    Hepatitis    In followup treatment for hepatitis C   Hypertension    no medication for at this time, sees Dr. Estevan Oaks Pcp   Hypotensive episode    hx of   Irritable bowel syndrome    Kidney stone    with hematuria   Memory loss    Migraines    Recurrent upper respiratory infection (URI)    Shortness of breath    Syncope     Past Surgical History:  Procedure Laterality Date   CHOLECYSTECTOMY  2019   DILATION AND CURETTAGE OF UTERUS     Left knee surgery     LIVER BIOPSY     TONSILLECTOMY     TUBAL LIGATION      Family History  Problem Relation Age of Onset   Heart disease Mother    Diabetes Mother    Asthma Mother    Anemia Mother    Cerebral aneurysm Mother        ruptured   Kidney failure Mother    Migraines Mother    Parkinson's disease Mother    Colon cancer Maternal Uncle    Heart disease Maternal Uncle    Heart disease Maternal Grandmother    Diabetes Maternal Grandmother    Asthma Maternal Grandmother    Heart Problems Maternal Grandmother    Heart Problems Other        through family   Diabetes Granddaughter    Diabetes Daughter     Social History   Socioeconomic History   Marital status: Single    Spouse name: Not on file   Number of children: 7   Years of education: Not on file   Highest education level: Not on file  Occupational History   Occupation:  Disabled  Tobacco Use   Smoking status: Never    Passive exposure: Never   Smokeless tobacco: Never  Vaping Use   Vaping status: Never Used  Substance and Sexual Activity   Alcohol use: Never    Alcohol/week: 0.0 standard drinks of alcohol   Drug use: Never   Sexual activity: Not on file  Other Topics Concern   Not on file  Social History Narrative   Lives at home. Has two sons and her grandchildren with her.    Right handed  Caffeine: very rare   Social Determinants of Health   Financial Resource Strain: Medium Risk (12/26/2022)   Received from [facility], [facility]   Overall Financial Resource Strain (CARDIA)    Difficulty of Paying Living Expenses: Somewhat hard  Food Insecurity: Food Insecurity Present (12/26/2022)   Received from [facility], [facility]   Hunger Vital Sign    Worried About Running Out of Food in the Last Year: Sometimes true    Ran Out of Food in the Last Year: Never true  Transportation Needs: No Transportation Needs (12/26/2022)   Received from [employer], [facility]   PRAPARE - Transportation    Lack of Transportation (Medical): No    Lack of Transportation (Non-Medical): No  Physical Activity: Insufficiently Active (12/26/2022)   Received from [facility], [facility]   Exercise Vital Sign    Days of Exercise per Week: 3 days    Minutes of Exercise per Session: 20 min  Stress: No Stress Concern Present (12/26/2022)   Received from [facility], [facility] - Occupational Stress Questionnaire    Feeling of Stress : Only a little  Social Connections: Socially Isolated (12/26/2022)   Received from [facility], [facility]   Social Network    How would you rate your social network (family, work, friends)?: Little participation, lonely and socially isolated  Intimate Partner Violence: Not At Risk (04/12/2023)   Received from [facility]   HITS    Over the last 12 months how often did  your partner physically hurt you?: Never    Over the last 12 months how often did your partner insult you or talk down to you?: Never    Over the last 12 months how often did your partner threaten you with physical harm?: Never    Over the last 12 months how often did your partner scream or curse at you?: Never    Review of Systems  HENT:  Positive for congestion and nosebleeds.   Respiratory:  Positive for shortness of breath and wheezing.     Vitals:   08/11/23 1515 08/11/23 1521  BP: (!) 158/89 (!) 158/81  Pulse: 70   SpO2: 97%      Physical Exam Constitutional:      Appearance: She is obese.  HENT:     Head: Normocephalic.     Mouth/Throat:     Mouth: Mucous membranes are moist.  Eyes:     Pupils: Pupils are equal, round, and reactive to light.  Cardiovascular:     Rate and Rhythm: Normal rate and regular rhythm.     Heart sounds: No murmur heard.    No friction rub.  Pulmonary:     Effort: No respiratory distress.     Breath sounds: No stridor. No wheezing or rhonchi.  Musculoskeletal:     Cervical back: No rigidity or tenderness.  Neurological:     Mental Status: She is alert.  Psychiatric:        Mood and Affect: Mood normal.    Data Reviewed: Spirometry 05/17/2023-normal spirometry  Assessment:  Asthma exacerbation  Currently on Omnicef Continues to have significant nasal discharge  Waking up up to 3 times to use albuterol  Continue Flovent  History of asthma/COPD overlap-inhalers did not help previously  Plan/Recommendations: Solu-Medrol 120  Continue Flovent  Call us if symptoms do not continue to improve while completing course of antibiotics-on Omnicef  May need to escalate inhalers  Follow-up in  about 4 to 6 weeks  Prescription for prednisone 40 mg daily for 5 days sent to pharmacy   Virl Diamond MD [facility] 08/11/2023, 3:38 PM  CC: Ladora Daniel, PA-C

## 2023-08-11 NOTE — Patient Instructions (Addendum)
We will give you a steroid shot  Solu-Medrol 120 mg  Complete your course of antibiotics  Continue Flovent  Call us back if symptoms not improving despite current treatment  Prescription for prednisone sent to pharmacy  Follow-up in about 4 to 6 weeks

## 2023-10-01 ENCOUNTER — Emergency Department (HOSPITAL_COMMUNITY)
Admission: EM | Admit: 2023-10-01 | Discharge: 2023-10-01 | Discharge status: Against Medical Advice | Payer: Medicaid Other | Attending: Emergency Medicine | Admitting: Emergency Medicine

## 2023-10-01 ENCOUNTER — Encounter (HOSPITAL_COMMUNITY): Payer: Self-pay | Admitting: Emergency Medicine

## 2023-10-01 ENCOUNTER — Other Ambulatory Visit: Payer: Self-pay

## 2023-10-01 DIAGNOSIS — G8929 Other chronic pain: Secondary | ICD-10-CM

## 2023-10-01 DIAGNOSIS — Z79899 Other long term (current) drug therapy: Secondary | ICD-10-CM | POA: Insufficient documentation

## 2023-10-01 DIAGNOSIS — R519 Headache, unspecified: Secondary | ICD-10-CM | POA: Insufficient documentation

## 2023-10-01 DIAGNOSIS — R110 Nausea: Secondary | ICD-10-CM | POA: Insufficient documentation

## 2023-10-01 DIAGNOSIS — Z5329 Procedure and treatment not carried out because of patient's decision for other reasons: Secondary | ICD-10-CM | POA: Diagnosis not present

## 2023-10-01 DIAGNOSIS — I10 Essential (primary) hypertension: Secondary | ICD-10-CM | POA: Diagnosis not present

## 2023-10-01 DIAGNOSIS — E1165 Type 2 diabetes mellitus with hyperglycemia: Secondary | ICD-10-CM | POA: Insufficient documentation

## 2023-10-01 DIAGNOSIS — J45909 Unspecified asthma, uncomplicated: Secondary | ICD-10-CM | POA: Insufficient documentation

## 2023-10-01 DIAGNOSIS — R5383 Other fatigue: Secondary | ICD-10-CM | POA: Insufficient documentation

## 2023-10-01 DIAGNOSIS — R1013 Epigastric pain: Secondary | ICD-10-CM | POA: Insufficient documentation

## 2023-10-01 DIAGNOSIS — Z8673 Personal history of transient ischemic attack (TIA), and cerebral infarction without residual deficits: Secondary | ICD-10-CM | POA: Diagnosis not present

## 2023-10-01 DIAGNOSIS — Z7951 Long term (current) use of inhaled steroids: Secondary | ICD-10-CM | POA: Diagnosis not present

## 2023-10-01 LAB — CBC WITH DIFFERENTIAL/PLATELET
Abs Immature Granulocytes: 0.03 10*3/uL (ref 0.00–0.07)
Basophils Absolute: 0 10*3/uL (ref 0.0–0.1)
Basophils Relative: 0 %
Eosinophils Absolute: 0.1 10*3/uL (ref 0.0–0.5)
Eosinophils Relative: 1 %
HCT: 35.2 % — ABNORMAL LOW (ref 36.0–46.0)
Hemoglobin: 12.4 g/dL (ref 12.0–15.0)
Immature Granulocytes: 0 %
Lymphocytes Relative: 26 %
Lymphs Abs: 2 10*3/uL (ref 0.7–4.0)
MCH: 33.4 pg (ref 26.0–34.0)
MCHC: 35.2 g/dL (ref 30.0–36.0)
MCV: 94.9 fL (ref 80.0–100.0)
Monocytes Absolute: 0.6 10*3/uL (ref 0.1–1.0)
Monocytes Relative: 8 %
Neutro Abs: 4.9 10*3/uL (ref 1.7–7.7)
Neutrophils Relative %: 65 %
Platelets: 151 10*3/uL (ref 150–400)
RBC: 3.71 MIL/uL — ABNORMAL LOW (ref 3.87–5.11)
RDW: 13.7 % (ref 11.5–15.5)
WBC: 7.7 10*3/uL (ref 4.0–10.5)
nRBC: 0 % (ref 0.0–0.2)

## 2023-10-01 LAB — COMPREHENSIVE METABOLIC PANEL
ALT: 59 U/L — ABNORMAL HIGH (ref 0–44)
AST: 70 U/L — ABNORMAL HIGH (ref 15–41)
Albumin: 2.6 g/dL — ABNORMAL LOW (ref 3.5–5.0)
Alkaline Phosphatase: 60 U/L (ref 38–126)
Anion gap: 7 (ref 5–15)
BUN: 16 mg/dL (ref 8–23)
CO2: 24 mmol/L (ref 22–32)
Calcium: 8.3 mg/dL — ABNORMAL LOW (ref 8.9–10.3)
Chloride: 110 mmol/L (ref 98–111)
Creatinine, Ser: 0.74 mg/dL (ref 0.44–1.00)
GFR, Estimated: >60 mL/min (ref >60)
Glucose, Bld: 108 mg/dL — ABNORMAL HIGH (ref 70–99)
Potassium: 3 mmol/L — ABNORMAL LOW (ref 3.5–5.1)
Sodium: 141 mmol/L (ref 135–145)
Total Bilirubin: 0.7 mg/dL (ref 0.0–1.2)
Total Protein: 5.2 g/dL — ABNORMAL LOW (ref 6.5–8.1)

## 2023-10-01 LAB — LIPASE, BLOOD: Lipase: 29 U/L (ref 11–51)

## 2023-10-01 LAB — CBG MONITORING, ED: Glucose-Capillary: 107 mg/dL — ABNORMAL HIGH (ref 70–99)

## 2023-10-01 MED ORDER — METOCLOPRAMIDE HCL 5 MG/ML IJ SOLN
10.0000 mg | Freq: Once | INTRAMUSCULAR | Status: AC
  Administered 2023-10-01: 10 mg via INTRAVENOUS
  Filled 2023-10-01: qty 2

## 2023-10-01 MED ORDER — PROCHLORPERAZINE EDISYLATE 10 MG/2ML IJ SOLN
10.0000 mg | Freq: Once | INTRAMUSCULAR | Status: AC
  Administered 2023-10-01: 10 mg via INTRAVENOUS
  Filled 2023-10-01: qty 2

## 2023-10-01 MED ORDER — AMLODIPINE BESYLATE 5 MG PO TABS: 5.0000 mg | ORAL_TABLET | Freq: Once | ORAL | Status: DC

## 2023-10-01 MED ORDER — SODIUM CHLORIDE 0.9 % IV BOLUS
500.0000 mL | Freq: Once | INTRAVENOUS | Status: AC
  Administered 2023-10-01: 500 mL via INTRAVENOUS

## 2023-10-01 MED ORDER — DIPHENHYDRAMINE HCL 50 MG/ML IJ SOLN
12.5000 mg | Freq: Once | INTRAMUSCULAR | Status: AC
  Administered 2023-10-01: 12.5 mg via INTRAVENOUS
  Filled 2023-10-01: qty 1

## 2023-10-01 NOTE — ED Provider Notes (Signed)
 Headache x 3 months.  Recurrent bloody nose.  Has R eye pain x 3 months  Awaits labs and anticipate d/c.    Unfortunately patient left AMA prior to my evaluation.   Fayrene Helper, PA-C 10/03/23 2313    Horton, Clabe Seal, DO 10/05/23 1510

## 2023-10-01 NOTE — ED Triage Notes (Addendum)
 Patient presents due to migraines, hypertension and possible hyperglycemia for 3 months. For the past 3 months she has noticed blood clots passing out of her nose. She is requesting a CT of the head. She also complains of dizziness and nausea.

## 2023-10-01 NOTE — ED Notes (Signed)
 Pt states they would like to leave AMA. Pt was educated on the risks, especially since she has not had any imaging done yet. Pt states they will return when the roads are safer, hopefully tomorrow.

## 2023-10-01 NOTE — ED Provider Notes (Cosign Needed)
 [place] EMERGENCY DEPARTMENT AT [facility] Provider Note   CSN: [id number] Arrival date & time: 10/01/23  1237     History  Chief Complaint  Patient presents with   Migraine   Hyperglycemia   Hypertension   Nausea    Susan Hogan is a 63 y.o. female.   Migraine Associated symptoms include headaches.  Hyperglycemia Hypertension Associated symptoms include headaches.  Pt presents today complaining of 3 month Hx of intermittent migraines with blood clots coming out of her nose when blowing her nose and flashing lights in R eye, light sensitivity, R eye pain, diarrhea.  Previous Endorses fatigue, nausea with no vomiting starting today. Describes the headache as a vice around head. Headache wakes patient up at night. Was previously given Toradol  and prednisone  for headache with relief on 09/07/2023, was also seen in Dec. For eye pain and was told she broke a blood vessel has been relieved by eye drops.   Also worried about BP and BG.   Concerned about these Sx due to her mom having similar symptoms before falling and going into a coma. Worried that she may have similar Sx. States mom had brain aneurysm.   Denies fever, chest pain, SOB, abdominal pain, dysuria.      Home Medications Prior to Admission medications   Medication Sig Start Date End Date Taking? Authorizing Provider  albuterol  (VENTOLIN  HFA) 108 (90 Base) MCG/ACT inhaler Inhale 1-2 puffs into the lungs every 6 (six) hours as needed for wheezing or shortness of breath. 02/10/23   Hope Almarie ORN, NP  amLODipine  (NORVASC ) 5 MG tablet Take 5 mg by mouth daily. 05/12/23   [provider]  Azelastine -Fluticasone  (DYMISTA ) 137-50 MCG/ACT SUSP Place 2 sprays into both nostrils 1 day or 1 dose. 05/14/23   Jeneal Danita Macintosh, MD  blood glucose meter kit and supplies KIT Dispense based on patient and insurance preference. Use up to four times daily as directed. (FOR ICD-9 250.00, 250.01).  03/25/18   Prentiss Frieze, DO  cephALEXin  (KEFLEX ) 500 MG capsule Take 1 capsule (500 mg total) by mouth 4 (four) times daily. 06/09/23   Ruthell Lonni FALCON, PA-C  cetirizine  (ZYRTEC ) 10 MG tablet Take 1 tablet (10 mg total) by mouth daily as needed for allergies. 05/14/23   Jeneal Danita Macintosh, MD  cyclobenzaprine  (FLEXERIL ) 10 MG tablet Take 10 mg by mouth 3 (three) times daily. 04/19/23   [provider]  fluticasone  (FLONASE ) 50 MCG/ACT nasal spray Place 1 spray into both nostrils daily as needed for allergies. 12/19/19   [provider]  fluticasone  (FLOVENT  HFA) 110 MCG/ACT inhaler Inhale 2 puffs into the lungs 2 (two) times daily. 05/14/23   Jeneal Danita Macintosh, MD  glucose blood [facility] ACTIVE STRIPS) test strip Use as instructed 04/15/16   Nevelyn Nat Almarie, PA-C  loratadine  (CLARITIN ) 10 MG tablet Take by mouth. 04/19/23   [provider]  nitrofurantoin , macrocrystal-monohydrate, (MACROBID ) 100 MG capsule Take 1 capsule (100 mg total) by mouth 2 (two) times daily. 02/14/22   Freddi Hamilton, MD  olmesartan  (BENICAR ) 20 MG tablet Take 1 tablet (20 mg total) by mouth daily. 05/26/22   Cardama, Raynell Moder, MD  Olopatadine  HCl (PATADAY ) 0.2 % SOLN Place 1 drop into both eyes 1 day or 1 dose. 05/14/23   Jeneal Danita Macintosh, MD  ondansetron  (ZOFRAN -ODT) 8 MG disintegrating tablet Take 1 tablet (8 mg total) by mouth every 8 (eight) hours as needed for nausea. 02/14/22   Freddi Hamilton, MD  pantoprazole  (PROTONIX ) 40 MG tablet Take 1 tablet (40 mg total) by mouth daily. 02/14/22   Freddi Hamilton, MD  sucralfate  (CARAFATE ) 1 g tablet Take 1 tablet (1 g total) by mouth 4 (four) times daily -  with meals and at bedtime. 02/14/22   Freddi Hamilton, MD  dicyclomine  (BENTYL ) 20 MG tablet Take 1 tablet (20 mg total) by mouth 2 (two) times daily. Patient not taking: Reported on 10/01/2019 08/19/19 10/01/19  Zada Elouise BROCKS, PA-C  diphenhydrAMINE  (BENADRYL ) 25 MG  tablet Take 1 tablet (25 mg total) by mouth every 6 (six) hours as needed. Patient not taking: Reported on 10/01/2019 09/24/19 10/01/19  Petrucelli, Samantha R, PA-C  escitalopram  (LEXAPRO ) 10 MG tablet TAKE 1 TABLET(10 MG) BY MOUTH DAILY Patient not taking: Reported on 07/05/2018 06/01/18 09/24/19  Prentiss Frieze, DO  labetalol  (NORMODYNE ) 100 MG tablet Take 1 tablet (100 mg total) by mouth 2 (two) times daily. Patient not taking: Reported on 10/01/2019 08/30/19 10/01/19  Zammit, Joseph, MD  topiramate  (TOPAMAX ) 100 MG tablet Take 1 tablet (100 mg total) by mouth at bedtime. Patient not taking: Reported on 10/01/2019 03/09/19 10/01/19  Ines Onetha NOVAK, MD      Allergies    Anesthetic ether [ether], Nubain [nalbuphine hcl], Penicillins, Lisinopril , Advair diskus [fluticasone -salmeterol], Lactose intolerance (gi), and Tizanidine     Review of Systems   Review of Systems  Eyes:  Positive for photophobia and visual disturbance.  Neurological:  Positive for headaches.  All other systems reviewed and are negative.   Physical Exam Updated Vital Signs BP (!) 175/106   Pulse 64   Temp 97.8 F (36.6 C) (Oral)   Resp 14   LMP  (LMP Unknown)   SpO2 100%  Physical Exam Vitals and nursing note reviewed.  Constitutional:      Appearance: Normal appearance.  HENT:     Head: Normocephalic and atraumatic.  Eyes:     General: No scleral icterus.       Right eye: No discharge.        Left eye: No discharge.     Extraocular Movements: Extraocular movements intact.     Conjunctiva/sclera: Conjunctivae normal.     Pupils: Pupils are equal, round, and reactive to light.  Cardiovascular:     Rate and Rhythm: Normal rate and regular rhythm.     Pulses: Normal pulses.     Heart sounds: Normal heart sounds. No murmur heard.    No friction rub. No gallop.  Pulmonary:     Effort: Pulmonary effort is normal. No respiratory distress.     Breath sounds: Normal breath sounds.  Abdominal:     General: Abdomen is  flat.     Palpations: Abdomen is soft.     Tenderness: There is no abdominal tenderness.  Skin:    General: Skin is warm and dry.  Neurological:     General: No focal deficit present.     Mental Status: She is alert and oriented to person, place, and time. Mental status is at baseline.     Sensory: No sensory deficit.     Motor: No weakness.     Coordination: Coordination normal.     Gait: Gait normal.  Psychiatric:        Mood and Affect: Mood normal.     ED Results / Procedures / Treatments   Labs (all labs ordered are listed, but only abnormal results are displayed) Labs Reviewed  CBG MONITORING, ED - Abnormal; Notable for the following components:  Result Value   Glucose-Capillary 107 (*)    All other components within normal limits  CBC WITH DIFFERENTIAL/PLATELET  COMPREHENSIVE METABOLIC PANEL  LIPASE, BLOOD  URINALYSIS, ROUTINE W REFLEX MICROSCOPIC    EKG None  Radiology No results found.  Procedures Procedures    Medications Ordered in ED Medications  amLODipine  (NORVASC ) tablet 5 mg (has no administration in time range)  metoCLOPramide  (REGLAN ) injection 10 mg (10 mg Intravenous Given 10/01/23 1512)  prochlorperazine  (COMPAZINE ) injection 10 mg (10 mg Intravenous Given 10/01/23 1512)  diphenhydrAMINE  (BENADRYL ) injection 12.5 mg (12.5 mg Intravenous Given 10/01/23 1512)  sodium chloride  0.9 % bolus 500 mL (500 mLs Intravenous New Bag/Given 10/01/23 1513)    ED Course/ Medical Decision Making/ A&P                                 Medical Decision Making  This patient is a 63 year old female who presents to the ED for concern of chronic migraine and nausea.   Differential diagnoses prior to evaluation: The emergent differential diagnosis includes, but is not limited to, pneumonia, PE, migraine, tension headache, cluster headache. This is not an exhaustive differential.   Past Medical History / Co-morbidities / Social History: Hepatitis C, IBS,  diabetes, asthma, chronic migraines, TIA  Additional history: Chart reviewed. Pertinent results include: Treated on 06/09/2023 for cystitis with Keflex   Lab Tests/Imaging studies: I personally interpreted labs/imaging and the pertinent results include:   CMP pending Lipase pending UA pending CBC pending CT abdomen pending CT head pending   Medications: I ordered medication including Compazine , Reglan , Benadryl .  I have reviewed the patients home medicines and have made adjustments as needed.  ED Course:  Patient is a 63 year old female presents to the ED with chronic migraines, nausea, epigastric pain.  She has coinciding symptoms of diarrhea, light sensitivity, flashing lights.  She has previously seen her eye doctor and was told that she had a broken blood vessel in her eye causing the pain but was otherwise benign.  Patient states that her mom died of an aneurysm at 62 and was concerned that she may have the same.  She is requesting a head CT.  Due to headache becoming worse over time and being unrelenting, ordered head CT.  Due to abdominal pain with diarrhea, ordered abdominal CT as well.  Provided Reglan , Benadryl , Compazine  for headache.  Continue to monitor and reevaluate headache.  Labs ordered and are pending.  Currently suspicious for gastritis, migraine.  As at this time low clinical suspicion for gastric bleed, head bleed, aneurysm. patient care has been being transferred over to [employer].   Disposition: 3:46 PM Care of Susan Hogan transferred to PA Nivia Colon at the end of my shift as the patient will require reassessment once labs/imaging have resulted. Patient presentation, ED course, and plan of care discussed with review of all pertinent labs and imaging. Please see his/her note for further details regarding further ED course and disposition. Plan at time of handoff is evaluate CT head, abdomen, labs, if negative, discharge home with PCP follow up, monitor for  akathisia. This may be altered or completely changed at the discretion of the oncoming team pending results of further workup.  Final Clinical Impression(s) / ED Diagnoses Final diagnoses:  Chronic intractable headache, unspecified headache type    Rx / DC Orders ED Discharge Orders     None  Beola Terrall RAMAN, [place] 10/01/23 1546

## 2023-10-13 ENCOUNTER — Emergency Department (HOSPITAL_COMMUNITY)
Admission: EM | Admit: 2023-10-13 | Discharge: 2023-10-13 | Disposition: A | Discharge status: Home / Self Care | Payer: Medicaid Other | Attending: Emergency Medicine | Admitting: Emergency Medicine

## 2023-10-13 ENCOUNTER — Emergency Department (HOSPITAL_COMMUNITY): Payer: Medicaid Other

## 2023-10-13 ENCOUNTER — Other Ambulatory Visit: Payer: Self-pay

## 2023-10-13 ENCOUNTER — Encounter (HOSPITAL_COMMUNITY): Payer: Self-pay

## 2023-10-13 DIAGNOSIS — Z79899 Other long term (current) drug therapy: Secondary | ICD-10-CM | POA: Insufficient documentation

## 2023-10-13 DIAGNOSIS — Z8673 Personal history of transient ischemic attack (TIA), and cerebral infarction without residual deficits: Secondary | ICD-10-CM | POA: Insufficient documentation

## 2023-10-13 DIAGNOSIS — E119 Type 2 diabetes mellitus without complications: Secondary | ICD-10-CM | POA: Diagnosis not present

## 2023-10-13 DIAGNOSIS — I10 Essential (primary) hypertension: Secondary | ICD-10-CM | POA: Diagnosis present

## 2023-10-13 DIAGNOSIS — G43809 Other migraine, not intractable, without status migrainosus: Secondary | ICD-10-CM | POA: Insufficient documentation

## 2023-10-13 LAB — CBC WITH DIFFERENTIAL/PLATELET
Abs Immature Granulocytes: 0.07 10*3/uL (ref 0.00–0.07)
Basophils Absolute: 0 10*3/uL (ref 0.0–0.1)
Basophils Relative: 0 %
Eosinophils Absolute: 0 10*3/uL (ref 0.0–0.5)
Eosinophils Relative: 0 %
HCT: 39.4 % (ref 36.0–46.0)
Hemoglobin: 13.7 g/dL (ref 12.0–15.0)
Immature Granulocytes: 1 %
Lymphocytes Relative: 18 %
Lymphs Abs: 2.1 10*3/uL (ref 0.7–4.0)
MCH: 32.9 pg (ref 26.0–34.0)
MCHC: 34.8 g/dL (ref 30.0–36.0)
MCV: 94.7 fL (ref 80.0–100.0)
Monocytes Absolute: 1 10*3/uL (ref 0.1–1.0)
Monocytes Relative: 8 %
Neutro Abs: 8.8 10*3/uL — ABNORMAL HIGH (ref 1.7–7.7)
Neutrophils Relative %: 73 %
Platelets: 200 10*3/uL (ref 150–400)
RBC: 4.16 MIL/uL (ref 3.87–5.11)
RDW: 13.4 % (ref 11.5–15.5)
WBC: 12 10*3/uL — ABNORMAL HIGH (ref 4.0–10.5)
nRBC: 0 % (ref 0.0–0.2)

## 2023-10-13 LAB — BASIC METABOLIC PANEL
Anion gap: 10 (ref 5–15)
BUN: 21 mg/dL (ref 8–23)
CO2: 21 mmol/L — ABNORMAL LOW (ref 22–32)
Calcium: 8.3 mg/dL — ABNORMAL LOW (ref 8.9–10.3)
Chloride: 108 mmol/L (ref 98–111)
Creatinine, Ser: 0.88 mg/dL (ref 0.44–1.00)
GFR, Estimated: >60 mL/min (ref >60)
Glucose, Bld: 111 mg/dL — ABNORMAL HIGH (ref 70–99)
Potassium: 3.3 mmol/L — ABNORMAL LOW (ref 3.5–5.1)
Sodium: 139 mmol/L (ref 135–145)

## 2023-10-13 LAB — CBG MONITORING, ED: Glucose-Capillary: 137 mg/dL — ABNORMAL HIGH (ref 70–99)

## 2023-10-13 LAB — TROPONIN I (HIGH SENSITIVITY)
Troponin I (High Sensitivity): 22 ng/L — ABNORMAL HIGH (ref <18)
Troponin I (High Sensitivity): 19 ng/L — ABNORMAL HIGH (ref <18)

## 2023-10-13 MED ORDER — HYDRALAZINE HCL 20 MG/ML IJ SOLN
10.0000 mg | Freq: Once | INTRAMUSCULAR | Status: AC
  Administered 2023-10-13: 10 mg via INTRAVENOUS
  Filled 2023-10-13: qty 1

## 2023-10-13 MED ORDER — PROCHLORPERAZINE EDISYLATE 10 MG/2ML IJ SOLN
10.0000 mg | Freq: Once | INTRAMUSCULAR | Status: AC
  Administered 2023-10-13: 10 mg via INTRAVENOUS
  Filled 2023-10-13: qty 2

## 2023-10-13 MED ORDER — DIPHENHYDRAMINE HCL 50 MG/ML IJ SOLN
25.0000 mg | Freq: Once | INTRAMUSCULAR | Status: AC
Start: 2023-10-13 — End: 2023-10-13
  Administered 2023-10-13: 25 mg via INTRAVENOUS
  Filled 2023-10-13: qty 1

## 2023-10-13 MED ORDER — AMLODIPINE BESYLATE 10 MG PO TABS: 10.0000 mg | ORAL_TABLET | Freq: Every day | ORAL | 0 refills | Status: DC

## 2023-10-13 NOTE — ED Notes (Signed)
Called for patient 3 times, no answer.

## 2023-10-13 NOTE — ED Notes (Signed)
Patient transported to CT 

## 2023-10-13 NOTE — Discharge Instructions (Addendum)
Please follow-up with your primary care provider and the neurologist have attached your and agrees to recent symptoms and ER visit.  Today your labs and imaging improved medications but you will need to follow-up with a neurologist for your migraines for long-term management and follow-up with your primary care provider to have your blood pressure rechecked.  We refilled your amlodipine so please take this as prescribed.  If symptoms change or worsen please return to the ER.  Thank you for the opportunity to take care of you in our Emergency Department. You have been diagnosed with high blood pressure, also known as hypertension. This means that the force of blood against the walls of your blood vessels called is too strong. It also means that your heart has to work harder to move the blood. High blood pressure usually has no symptoms, but over time, it can cause serious health problems such as Heart attack and heart failure Stroke Kidney disease and failure Vision loss With the help from your healthcare provider and some important life style changes, you can manage your blood pressure and protect your health. Please read the instructions provided on hypertension, how to manage it and how to check your blood pressure. Additionally, use the blood pressure log provided to record your blood pressures. Take the blood pressure log with you to your primary care doctor so that they can adjust your blood pressure medications if needed. Please read the instructions on follow-up appointment. Return to the ER or Call 911 right away if you have any of these symptoms: Chest pain or shortness of breath Severe headache Weakness, tingling, or numbness of your face, arms, or legs (especially on 1 side of the body) Sudden change in vision Confusion, trouble speaking, or trouble understanding speech

## 2023-10-13 NOTE — ED Provider Notes (Signed)
[place] EMERGENCY DEPARTMENT AT [facility] Provider Note   CSN: [id number] Arrival date & time: 10/13/23  0446     History  Chief Complaint  Patient presents with   Headache    Susan Hogan is a 63 y.o. female history of MDD, hepatitis C, diabetes mellitus, fibromyalgia, migraines, TIA, hypertension presented with hypertension and headache.  Patient states that the headaches been present for the past 4 months and she has been seen multiple times for this and that this headache is similar to previous migraines.  Patient describes the migraine as moving from side-to-side and all around her head but states this is very similar to previous headaches she has had and was not sudden maximal onset and has been present for the past 4 months no changes.  Patient states she did see go for neurology in the past however missed too many appointments has not seen them since but states that they cannot get her migraine control there.  Patient states she is on Topamax for her migraines but this is not helping.  Patient also notes that she is hypertensive.  Patient states that she is on olmesartan for her blood pressure but states this is not working.  Patient is recovering from viral illness over the past few weeks and states that she has had constant cough that is causing her chest discomfort when she coughs but denies any shortness of breath or radiation of this chest pain.  Patient denies vision changes, jaw claudication, neck pain  Home Medications Prior to Admission medications   Medication Sig Start Date End Date Taking? Authorizing Provider  amLODipine (NORVASC) 10 MG tablet Take 1 tablet (10 mg total) by mouth daily. 10/13/23 11/12/23 Yes Dewanda Fennema, Beverly Gust, PA-C  levocetirizine (XYZAL) 5 MG tablet Take 5 mg by mouth every evening. 08/12/23  Yes [provider]  albuterol (VENTOLIN HFA) 108 (90 Base) MCG/ACT inhaler Inhale 1-2 puffs into the lungs every 6 (six) hours as needed for  wheezing or shortness of breath. 02/10/23   Glenford Bayley, NP  Azelastine-Fluticasone [facility]) 137-50 MCG/ACT SUSP Place 2 sprays into both nostrils 1 day or 1 dose. 05/14/23   Marcelyn Bruins, MD  cetirizine (ZYRTEC) 10 MG tablet Take 1 tablet (10 mg total) by mouth daily as needed for allergies. 05/14/23   Marcelyn Bruins, MD  cyclobenzaprine (FLEXERIL) 10 MG tablet Take 10 mg by mouth 3 (three) times daily. 04/19/23   [provider]  fluticasone (FLONASE) 50 MCG/ACT nasal spray Place 1 spray into both nostrils daily as needed for allergies. 12/19/19   [provider]  fluticasone (FLOVENT HFA) 110 MCG/ACT inhaler Inhale 2 puffs into the lungs 2 (two) times daily. 05/14/23   Marcelyn Bruins, MD  loratadine (CLARITIN) 10 MG tablet Take by mouth. 04/19/23   [provider]  olmesartan (BENICAR) 20 MG tablet Take 1 tablet (20 mg total) by mouth daily. 05/26/22   Cardama, Amadeo Garnet, MD  Olopatadine HCl (PATADAY) 0.2 % SOLN Place 1 drop into both eyes 1 day or 1 dose. 05/14/23   Marcelyn Bruins, MD  ondansetron (ZOFRAN-ODT) 8 MG disintegrating tablet Take 1 tablet (8 mg total) by mouth every 8 (eight) hours as needed for nausea. 02/14/22   Pricilla Loveless, MD  pantoprazole (PROTONIX) 40 MG tablet Take 1 tablet (40 mg total) by mouth daily. 02/14/22   Pricilla Loveless, MD  rizatriptan (MAXALT-MLT) 10 MG disintegrating tablet Take 10 mg by mouth as needed for migraine.  [provider]  sucralfate (CARAFATE) 1 g tablet Take 1 tablet (1 g total) by mouth 4 (four) times daily -  with meals and at bedtime. 02/14/22   Pricilla Loveless, MD  dicyclomine (BENTYL) 20 MG tablet Take 1 tablet (20 mg total) by mouth 2 (two) times daily. Patient not taking: Reported on 10/01/2019 08/19/19 10/01/19  Anselm Pancoast, PA-C  diphenhydrAMINE (BENADRYL) 25 MG tablet Take 1 tablet (25 mg total) by mouth every 6 (six) hours as needed. Patient not taking:  Reported on 10/01/2019 09/24/19 10/01/19  Petrucelli, Lelon Mast R, PA-C  escitalopram (LEXAPRO) 10 MG tablet TAKE 1 TABLET(10 MG) BY MOUTH DAILY Patient not taking: Reported on 07/05/2018 06/01/18 09/24/19  Helane Rima, DO  labetalol (NORMODYNE) 100 MG tablet Take 1 tablet (100 mg total) by mouth 2 (two) times daily. Patient not taking: Reported on 10/01/2019 08/30/19 10/01/19  Bethann Berkshire, MD  topiramate (TOPAMAX) 100 MG tablet Take 1 tablet (100 mg total) by mouth at bedtime. Patient not taking: Reported on 10/01/2019 03/09/19 10/01/19  Anson Fret, MD      Allergies    Anesthetic ether [ether], Nubain [nalbuphine hcl], Penicillins, Lisinopril, Advair diskus [fluticasone-salmeterol], Lactose intolerance (gi), and Tizanidine    Review of Systems   Review of Systems  Neurological:  Positive for headaches.    Physical Exam Updated Vital Signs BP (!) 146/64   Pulse 78   Temp 98.3 F (36.8 C) (Oral)   Resp (!) 22   Ht 4\' 11"  (1.499 m)   Wt 93.9 kg   LMP  (LMP Unknown)   SpO2 98%   BMI 41.81 kg/m  Physical Exam Constitutional:      Appearance: She is normal weight.  HENT:     Head: Normocephalic.     Comments: No temporal artery tenderness No jaw claudication Eyes:     Extraocular Movements: Extraocular movements intact.     Conjunctiva/sclera: Conjunctivae normal.     Pupils: Pupils are equal, round, and reactive to light.  Cardiovascular:     Rate and Rhythm: Normal rate and regular rhythm.     Pulses: Normal pulses.     Heart sounds: Normal heart sounds.  Pulmonary:     Effort: Pulmonary effort is normal.     Breath sounds: Normal breath sounds.  Abdominal:     Palpations: Abdomen is soft.     Tenderness: There is no abdominal tenderness. There is no guarding or rebound.  Musculoskeletal:        General: Normal range of motion.     Cervical back: Normal range of motion. No rigidity or tenderness.  Skin:    General: Skin is warm and dry.     Capillary Refill:  Capillary refill takes less than 2 seconds.  Neurological:     General: No focal deficit present.     Mental Status: She is alert.     Sensory: Sensation is intact.     Motor: Motor function is intact.     Coordination: Coordination is intact.     Gait: Gait is intact.     Comments: Cranial nerves III through XII intact Vision grossly intact Sensation tact in all 4 extremities  Psychiatric:        Mood and Affect: Mood normal.     ED Results / Procedures / Treatments   Labs (all labs ordered are listed, but only abnormal results are displayed) Labs Reviewed  BASIC METABOLIC PANEL - Abnormal; Notable for the following components:  Result Value   Potassium 3.3 (*)    CO2 21 (*)    Glucose, Bld 111 (*)    Calcium 8.3 (*)    All other components within normal limits  CBC WITH DIFFERENTIAL/PLATELET - Abnormal; Notable for the following components:   WBC 12.0 (*)    Neutro Abs 8.8 (*)    All other components within normal limits  CBG MONITORING, ED - Abnormal; Notable for the following components:   Glucose-Capillary 137 (*)    All other components within normal limits  TROPONIN I (HIGH SENSITIVITY) - Abnormal; Notable for the following components:   Troponin I (High Sensitivity) 22 (*)    All other components within normal limits  TROPONIN I (HIGH SENSITIVITY) - Abnormal; Notable for the following components:   Troponin I (High Sensitivity) 19 (*)    All other components within normal limits    EKG EKG Interpretation Date/Time:  Wednesday October 13 2023 06:48:26 EST Ventricular Rate:  81 PR Interval:  150 QRS Duration:  95 QT Interval:  416 QTC Calculation: 483 R Axis:   5  Text Interpretation: Sinus rhythm Low voltage, precordial leads Confirmed by Gilda Crease ([id number]) on 10/13/2023 7:02:56 AM  Radiology CT Head Wo Contrast Result Date: 10/13/2023 CLINICAL DATA:  Headache with hypertension. Intermittent nosebleeds. EXAM: CT HEAD WITHOUT CONTRAST  TECHNIQUE: Contiguous axial images were obtained from the base of the skull through the vertex without intravenous contrast. RADIATION DOSE REDUCTION: This exam was performed according to the departmental dose-optimization program which includes automated exposure control, adjustment of the mA and/or kV according to patient size and/or use of iterative reconstruction technique. COMPARISON:  Head CT 12/12/2021 FINDINGS: Brain: No evidence of acute infarction, hemorrhage, hydrocephalus, extra-axial collection or mass lesion/mass effect. There is mild cerebral atrophy and small-vessel disease. No midline shift. Basal cisterns are clear. Vascular: There are scattered calcifications in carotid siphons. No hyperdense central vessel is seen. Skull: Negative for fractures or focal lesions. Sinuses/Orbits: There is mild membrane thickening in the paranasal sinuses without fluid levels. The visualized mastoid air cells are clear. Negative visualized orbits. Other: None. IMPRESSION: 1. No acute intracranial CT findings. 2. Mild atrophy and small-vessel disease. 3. Mild membrane thickening in the paranasal sinuses without fluid levels. Electronically Signed   By: Almira Bar M.D.   On: 10/13/2023 07:54   DG Chest Port 1 View Result Date: 10/13/2023 CLINICAL DATA:  63 year old female with headache, hypertension, cough. EXAM: PORTABLE CHEST 1 VIEW COMPARISON:  Portable chest 06/09/2023 and earlier. FINDINGS: Portable AP semi upright view at 0651 hours. Borderline to mild cardiomegaly. Mild tortuosity of the thoracic aorta is stable. Other mediastinal contours are within normal limits. Visualized tracheal air column is within normal limits. Allowing for portable technique the lungs are clear. No pneumothorax or pleural effusion. Paucity bowel gas. No acute osseous abnormality identified. IMPRESSION: Borderline to mild cardiomegaly. No acute cardiopulmonary abnormality. Electronically Signed   By: Odessa Fleming M.D.   On:  10/13/2023 06:57    Procedures .Critical Care  Performed by: Netta Corrigan, PA-C Authorized by: Netta Corrigan, PA-C   Critical care provider statement:    Critical care time (minutes):  40   Critical care time was exclusive of:  Separately billable procedures and treating other patients   Critical care was necessary to treat or prevent imminent or life-threatening deterioration of the following conditions: Hypertension.   Critical care was time spent personally by me on the following activities:  Blood draw for  specimens, development of treatment plan with patient or surrogate, ordering and performing treatments and interventions, ordering and review of laboratory studies, ordering and review of radiographic studies, pulse oximetry, re-evaluation of patient's condition, review of old charts, obtaining history from patient or surrogate, examination of patient and evaluation of patient's response to treatment   I assumed direction of critical care for this patient from another provider in my specialty: no       Medications Ordered in ED Medications  prochlorperazine (COMPAZINE) injection 10 mg (10 mg Intravenous Given 10/13/23 0656)  diphenhydrAMINE (BENADRYL) injection 25 mg (25 mg Intravenous Given 10/13/23 0655)  hydrALAZINE (APRESOLINE) injection 10 mg (10 mg Intravenous Given 10/13/23 8413)    ED Course/ Medical Decision Making/ A&P                                 Medical Decision Making Amount and/or Complexity of Data Reviewed Labs: ordered. Radiology: ordered.  Risk Prescription drug management.   Susan Hogan 63 y.o. presented today for elevated HTN and headache. Working DDx that I considered at this time includes, but not limited to, HTN urgency/emergency, intracranial hemorrhage, acute renal artery stenosis, acute kidney injury, ACS, ophthalmologic emergencies, ICH, epidural/subdural hematoma, SAH, migraine, IIH, NPH, CVA/TIA.  R/o DDx: HTN urgency/emergency,  intracranial hemorrhage, acute renal artery stenosis, acute kidney injury, ACS, ophthalmologic emergencies, ICH, epidural/subdural hematoma, SAH, IIH, NPH, CVA/TIA: These are considered less likely due to history of present illness, physical exam, labs/imaging findings  Review of prior external notes: 10/01/2023 ED  Unique Tests and My Interpretation:  CBC: Unremarkable BMP: Unremarkable CBG: 137 Troponin: 22, 19 EKG: Rate, rhythm, axis, intervals all examined and without medically relevant abnormality. ST segments without concerns for elevations CT Head w/o Contrast: No acute findings Chest x-ray: No acute findings  Social Determinants of Health: none  Discussion with Independent Historian: None  Discussion of Management of Tests: None  Risk: Medium: prescription drug management  Risk Stratification Score: None  Staffed with Belfi, MD  Plan: On exam patient was in no acute distress but appeared uncomfortable and was noted to be hypertensive with systolic of 228 when I first evaluated her.  Patient neuroexam was reassuring however given hypertension we will obtain hypertensive workup.  Headache is been constant over the past 4 months did not feel this is an acute pathology but given that she is hypertensive here we will get CT head.  If workup is negative will refer her back to [facility] neurology as she most likely needs to follow-up with her neurologist for long-term management of her migraines.  Will give Compazine and Benadryl for headache and hold off on lowering her blood pressure until we get a CT scan.  CT scan did not show any acute features patient's labs were ultimately reassuring however troponin was 22 and will get a repeat.  Patient states she was having chest discomfort however this is only when she coughs as she is getting over a viral illness and so do feel this is more MSK than cardiogenic in nature and will get repeat.  Patient will be given hydralazine for her  hypertension as I do feel her headache is related to her blood pressure.  The Compazine and Benadryl did not help.  Pending second troponin do feel patient could have early hypertensive emergency if bump in troponin with hypertension and would need admission. If down trending patient does feel she can go home.  On recheck patient was doing well and was walking back from the bathroom without any abnormalities in her gait.  Repeat troponin is downtrending.  Repeat blood pressure has also improved.  I went to reevaluate the patient patient states she was feeling better with the medications and feels comfortable with discharge and following up outpatient.  Will refill patient's amlodipine.  It appears patient was on 10 mg amlodipine until August of last year which was dropped to 5 mg and so we will refill this at the 10 mg and have her follow-up with her primary care provider for blood pressure check.  Will also refer patient back to her neurologist for long-term management of her migraines.  In the meantime I spoke to the patient about using Tylenol every 6 hours needed for pain and we discussed return precautions.  At this time do not feel patient would benefit from admission as this is most likely uncontrolled hypertension and not hypertensive emergency and as discussed patient feels comfortable with being discharged after shared decision making.  Patient was given return precautions. Patient stable for discharge at this time.  Patient verbalized understanding of plan.  This chart was dictated using voice recognition software.  Despite best efforts to proofread,  errors can occur which can change the documentation meaning.         Final Clinical Impression(s) / ED Diagnoses Final diagnoses:  Uncontrolled hypertension  Other migraine without status migrainosus, not intractable    Rx / DC Orders ED Discharge Orders          Ordered    amLODipine (NORVASC) 10 MG tablet  Daily        10/13/23  1003    Ambulatory referral to Neurology       Comments: An appointment is requested in approximately: 1 week   10/13/23 1004              Remi Deter 10/13/23 1008    Rolan Bucco, MD 10/13/23 [phone]

## 2023-10-13 NOTE — ED Triage Notes (Signed)
Pt reports taking excedrin and half of a sudafed at 0200

## 2023-10-13 NOTE — ED Triage Notes (Signed)
Pt reports that she has had headaches for 4 months but today they are much worse today. Pt reports that when she blows her nose or sneezes she gets blood clots from her nose. Pt reports cough as well. Pt is hypertensive at triage.

## 2023-10-20 ENCOUNTER — Ambulatory Visit: Payer: Medicaid Other | Admitting: Pulmonary Disease

## 2023-10-25 ENCOUNTER — Encounter: Payer: Self-pay | Admitting: Neurology

## 2023-11-15 ENCOUNTER — Ambulatory Visit: Payer: Medicare Other | Admitting: Pulmonary Disease

## 2023-11-16 ENCOUNTER — Encounter: Payer: Self-pay | Admitting: Pulmonary Disease

## 2023-11-16 ENCOUNTER — Ambulatory Visit (INDEPENDENT_AMBULATORY_CARE_PROVIDER_SITE_OTHER): Payer: Medicare Other | Admitting: Pulmonary Disease

## 2023-11-16 VITALS — BP 150/90 | HR 81 | Temp 98.0°F | Ht 59.0 in | Wt 217.8 lb

## 2023-11-16 DIAGNOSIS — J4489 Other specified chronic obstructive pulmonary disease: Secondary | ICD-10-CM | POA: Diagnosis not present

## 2023-11-16 DIAGNOSIS — J45901 Unspecified asthma with (acute) exacerbation: Secondary | ICD-10-CM | POA: Diagnosis not present

## 2023-11-16 MED ORDER — CLARITHROMYCIN 500 MG PO TABS: 500.0000 mg | ORAL_TABLET | Freq: Two times a day (BID) | ORAL | 0 refills | Status: DC

## 2023-11-16 MED ORDER — BENZONATATE 200 MG PO CAPS
200.0000 mg | ORAL_CAPSULE | Freq: Three times a day (TID) | ORAL | 1 refills | Status: DC | PRN
Start: 2023-11-16 — End: 2024-01-28

## 2023-11-16 MED ORDER — PREDNISONE 20 MG PO TABS: 20.0000 mg | ORAL_TABLET | Freq: Every day | ORAL | 0 refills | Status: DC

## 2023-11-16 NOTE — Progress Notes (Signed)
 Susan Hogan    [id number]    05-14-1961  Primary Care Physician:Beal, Christa See  Referring Physician: Ladora Daniel, PA-C [address] [place],  [place] [address]  Chief complaint:   Asthma with uncontrolled symptoms In for cough and congestion  HPI:  Patient with asthma  Cough and congestion over the last few weeks Was recently in the hospital for a lower respiratory infection as treated -Treated with a course of doxycycline  Continues with Flovent  Shortness of breath with a lot of activities Cough and congestion Nasal stuffiness  Did use Symbicort and Breo in the past  She does use Flonase  History of asthma COPD overlap Exposure to secondhand smoke in the past   Outpatient Encounter Medications as of 11/16/2023  Medication Sig   albuterol (VENTOLIN HFA) 108 (90 Base) MCG/ACT inhaler Inhale 1-2 puffs into the lungs every 6 (six) hours as needed for wheezing or shortness of breath.   Azelastine-Fluticasone (DYMISTA) 137-50 MCG/ACT SUSP Place 2 sprays into both nostrils 1 day or 1 dose.   cetirizine (ZYRTEC) 10 MG tablet Take 1 tablet (10 mg total) by mouth daily as needed for allergies.   cyclobenzaprine (FLEXERIL) 10 MG tablet Take 10 mg by mouth 3 (three) times daily.   fluticasone (FLONASE) 50 MCG/ACT nasal spray Place 1 spray into both nostrils daily as needed for allergies.   fluticasone (FLOVENT HFA) 110 MCG/ACT inhaler Inhale 2 puffs into the lungs 2 (two) times daily.   levocetirizine (XYZAL) 5 MG tablet Take 5 mg by mouth every evening.   loratadine (CLARITIN) 10 MG tablet Take by mouth.   olmesartan (BENICAR) 20 MG tablet Take 1 tablet (20 mg total) by mouth daily.   Olopatadine HCl (PATADAY) 0.2 % SOLN Place 1 drop into both eyes 1 day or 1 dose.   ondansetron (ZOFRAN-ODT) 8 MG disintegrating tablet Take 1 tablet (8 mg total) by mouth every 8 (eight) hours as needed for nausea.   pantoprazole (PROTONIX) 40 MG tablet Take 1 tablet (40 mg  total) by mouth daily.   rizatriptan (MAXALT-MLT) 10 MG disintegrating tablet Take 10 mg by mouth as needed for migraine.   sucralfate (CARAFATE) 1 g tablet Take 1 tablet (1 g total) by mouth 4 (four) times daily -  with meals and at bedtime.   amLODipine (NORVASC) 10 MG tablet Take 1 tablet (10 mg total) by mouth daily.   [DISCONTINUED] dicyclomine (BENTYL) 20 MG tablet Take 1 tablet (20 mg total) by mouth 2 (two) times daily. (Patient not taking: Reported on 10/01/2019)   [DISCONTINUED] diphenhydrAMINE (BENADRYL) 25 MG tablet Take 1 tablet (25 mg total) by mouth every 6 (six) hours as needed. (Patient not taking: Reported on 10/01/2019)   [DISCONTINUED] escitalopram (LEXAPRO) 10 MG tablet TAKE 1 TABLET(10 MG) BY MOUTH DAILY (Patient not taking: Reported on 07/05/2018)   [DISCONTINUED] labetalol (NORMODYNE) 100 MG tablet Take 1 tablet (100 mg total) by mouth 2 (two) times daily. (Patient not taking: Reported on 10/01/2019)   [DISCONTINUED] topiramate (TOPAMAX) 100 MG tablet Take 1 tablet (100 mg total) by mouth at bedtime. (Patient not taking: Reported on 10/01/2019)   No facility-administered encounter medications on file as of 11/16/2023.    Allergies as of 11/16/2023 - Review Complete 11/16/2023  Allergen Reaction Noted   Anesthetic ether [ether] Shortness Of Breath 11/25/2011   Nubain [nalbuphine hcl] Anaphylaxis 06/14/2012   Penicillins Hives and Shortness Of Breath    Lisinopril Hives 12/19/2018  Advair diskus [fluticasone-salmeterol] Hives 03/16/2013   Lactose intolerance (gi) Diarrhea 11/24/2013   Tizanidine Palpitations and Other (See Comments) 02/19/2020    Past Medical History:  Diagnosis Date   Anemia    Arthritis    RA "states does not see Rheumatologist"   Asthma    Carpal tunnel syndrome    bilaterally   Complication of anesthesia    difficulty breathing   COPD (chronic obstructive pulmonary disease) (HCC)    COVID-19    Degenerative disk disease    back   Depression     Diabetes mellitus    Fibromyalgia    GERD (gastroesophageal reflux disease)    Headache(784.0)    migraines   Heart murmur    Hepatitis    In followup treatment for hepatitis C   Hypertension    no medication for at this time, sees Dr. Estevan Oaks Pcp   Hypotensive episode    hx of   Irritable bowel syndrome    Kidney stone    with hematuria   Memory loss    Migraines    Recurrent upper respiratory infection (URI)    Shortness of breath    Syncope     Past Surgical History:  Procedure Laterality Date   CHOLECYSTECTOMY  2019   DILATION AND CURETTAGE OF UTERUS     Left knee surgery     LIVER BIOPSY     TONSILLECTOMY     TUBAL LIGATION      Family History  Problem Relation Age of Onset   Heart disease Mother    Diabetes Mother    Asthma Mother    Anemia Mother    Cerebral aneurysm Mother        ruptured   Kidney failure Mother    Migraines Mother    Parkinson's disease Mother    Colon cancer Maternal Uncle    Heart disease Maternal Uncle    Heart disease Maternal Grandmother    Diabetes Maternal Grandmother    Asthma Maternal Grandmother    Heart Problems Maternal Grandmother    Heart Problems Other        through family   Diabetes Granddaughter    Diabetes Daughter     Social History   Socioeconomic History   Marital status: Single    Spouse name: Not on file   Number of children: 7   Years of education: Not on file   Highest education level: Not on file  Occupational History   Occupation: Disabled  Tobacco Use   Smoking status: Never    Passive exposure: Never   Smokeless tobacco: Never  Vaping Use   Vaping status: Never Used  Substance and Sexual Activity   Alcohol use: Never    Alcohol/week: 0.0 standard drinks of alcohol   Drug use: Never   Sexual activity: Not on file  Other Topics Concern   Not on file  Social History Narrative   Lives at home. Has two sons and her grandchildren with her.    Right handed   Caffeine: very  rare   Social Drivers of Health   Financial Resource Strain: Medium Risk (11/02/2023)   Received from [facility]   Overall Financial Resource Strain (CARDIA)    Difficulty of Paying Living Expenses: Somewhat hard  Food Insecurity: Food Insecurity Present (11/02/2023)   Received from [facility]   Hunger Vital Sign    Worried About Running Out of Food in the Last Year: Sometimes true    Ran Out  of Food in the Last Year: Sometimes true  Transportation Needs: No Transportation Needs (11/02/2023)   Received from [facility] - Transportation    Lack of Transportation (Medical): No    Lack of Transportation (Non-Medical): No  Physical Activity: Insufficiently Active (12/26/2022)   Received from [facility], [facility]   Exercise Vital Sign    Days of Exercise per Week: 3 days    Minutes of Exercise per Session: 20 min  Stress: No Stress Concern Present (12/26/2022)   Received from [facility], [facility] - Occupational Stress Questionnaire    Feeling of Stress : Only a little  Social Connections: Socially Isolated (12/26/2022)   Received from [facility], [facility]   Social Network    How would you rate your social network (family, work, friends)?: Little participation, lonely and socially isolated  Intimate Partner Violence: Not At Risk (04/12/2023)   Received from [facility]   HITS    Over the last 12 months how often did your partner physically hurt you?: Never    Over the last 12 months how often did your partner insult you or talk down to you?: Never    Over the last 12 months how often did your partner threaten you with physical harm?: Never    Over the last 12 months how often did your partner scream or curse at you?: Never    Review of Systems  HENT:  Positive for congestion.   Respiratory:  Positive for shortness of breath and wheezing.     Vitals:   11/16/23 1408  BP: (!) 150/90  Pulse: 81  Temp:  98 F (36.7 C)  SpO2: 96%     Physical Exam Constitutional:      Appearance: She is obese.  HENT:     Head: Normocephalic.     Mouth/Throat:     Mouth: Mucous membranes are moist.  Eyes:     Pupils: Pupils are equal, round, and reactive to light.  Cardiovascular:     Rate and Rhythm: Normal rate and regular rhythm.     Heart sounds: No murmur heard.    No friction rub.  Pulmonary:     Effort: No respiratory distress.     Breath sounds: No stridor. No wheezing or rhonchi.  Musculoskeletal:     Cervical back: No rigidity or tenderness.  Neurological:     Mental Status: She is alert.  Psychiatric:        Mood and Affect: Mood normal.    Data Reviewed: Spirometry 05/17/2023-normal spirometry  Assessment:  Asthma exacerbation  Significant exacerbation of symptoms  Failed recent antibiotic therapy, was given a course of doxycycline   History of asthma/COPD overlap-inhalers did not help previously  Plan/Recommendations: Continue current inhalers  Will call in a prescription for Biaxin  Prednisone 20 for 7 days  Prescription for tessalon perles  Follow-up in about 3 months   Virl Diamond MD  Pulmonary and Critical Care 11/16/2023, 2:14 PM  CC: Ladora Daniel, PA-C

## 2023-11-16 NOTE — Patient Instructions (Signed)
 Call you in a prescription for Biaxin to be used for 7-day  Prednisone for 7 days  Tessalon Perles  Call us if your symptoms are not getting any better  Follow-up in about 3 months

## 2023-12-13 ENCOUNTER — Ambulatory Visit: Payer: Medicaid Other | Admitting: Pulmonary Disease

## 2023-12-30 DIAGNOSIS — L089 Local infection of the skin and subcutaneous tissue, unspecified: Secondary | ICD-10-CM | POA: Diagnosis not present

## 2023-12-30 DIAGNOSIS — R0602 Shortness of breath: Secondary | ICD-10-CM | POA: Diagnosis not present

## 2023-12-30 DIAGNOSIS — J0141 Acute recurrent pansinusitis: Secondary | ICD-10-CM | POA: Diagnosis not present

## 2023-12-30 DIAGNOSIS — R062 Wheezing: Secondary | ICD-10-CM | POA: Diagnosis not present

## 2023-12-30 DIAGNOSIS — R0981 Nasal congestion: Secondary | ICD-10-CM | POA: Diagnosis not present

## 2023-12-30 DIAGNOSIS — I10 Essential (primary) hypertension: Secondary | ICD-10-CM | POA: Diagnosis not present

## 2023-12-30 DIAGNOSIS — R059 Cough, unspecified: Secondary | ICD-10-CM | POA: Diagnosis not present

## 2023-12-30 DIAGNOSIS — H109 Unspecified conjunctivitis: Secondary | ICD-10-CM | POA: Diagnosis not present

## 2023-12-30 DIAGNOSIS — R6883 Chills (without fever): Secondary | ICD-10-CM | POA: Diagnosis not present

## 2023-12-31 ENCOUNTER — Ambulatory Visit: Payer: No Typology Code available for payment source | Admitting: Family Medicine

## 2023-12-31 NOTE — Progress Notes (Deleted)
 [address] [place] [address] Dept: [phone]  FOLLOW UP NOTE  Patient ID: Susan Hogan, female    DOB: 1961/04/18  Age: 63 y.o. MRN: [id number] Date of Office Visit: 12/31/2023  Assessment  Chief Complaint: No chief complaint on file.  HPI Susan Hogan is a 63 year old female who presents to the clinic for follow-up visit.  She was last seen in this clinic on 05/14/2023 by Dr. Delorse Lek as a new patient for evaluation of asthma COPD overlap syndrome, allergic rhinitis, and allergic conjunctivitis.  Her last environmental allergy testing was positive to dust mite, tree pollen, grass pollen, and ragweed pollen.  In the interim, she has received antibiotics on 08/02/2023, 08/12/2023, and 10/22/2022 for sinus infection.  Of note, her absolute eosinophil count on 06/09/2023 was 200.    History: Shortness of breath  Technique: 2 view chest  Comparison: 04/12/2023  Interpretation: The lungs are clear. There is no pneumothorax or pleural effusion. The heart size is within normal limits.    Impression: No acute findings.  Electronically Signed by: Allen Derry on 12/30/2023 2:49 PM EXAM: CT HEAD WITHOUT CONTRAST   TECHNIQUE: Contiguous axial images were obtained from the base of the skull through the vertex without intravenous contrast.   RADIATION DOSE REDUCTION: This exam was performed according to the departmental dose-optimization program which includes automated exposure control, adjustment of the mA and/or kV according to patient size and/or use of iterative reconstruction technique.   COMPARISON:  Head CT 12/12/2021   FINDINGS: Brain: No evidence of acute infarction, hemorrhage, hydrocephalus, extra-axial collection or mass lesion/mass effect. There is mild cerebral atrophy and small-vessel disease. No midline shift. Basal cisterns are clear.   Vascular: There are scattered calcifications in carotid siphons. No hyperdense central vessel is seen.   Skull:  Negative for fractures or focal lesions.   Sinuses/Orbits: There is mild membrane thickening in the paranasal sinuses without fluid levels. The visualized mastoid air cells are clear. Negative visualized orbits.   Other: None.   IMPRESSION: 1. No acute intracranial CT findings. 2. Mild atrophy and small-vessel disease. 3. Mild membrane thickening in the paranasal sinuses without fluid levels.     Electronically Signed   By: Almira Bar M.D.   On: 10/13/2023 07:54    Drug Allergies:  Allergies  Allergen Reactions   Anesthetic Ether [Ether] Shortness Of Breath    IV anesthesia causes extreme pain and tightness in back and ribcage.   Nubain [Nalbuphine Hcl] Anaphylaxis   Penicillins Hives and Shortness Of Breath    DID THE REACTION INVOLVE: Swelling of the face/tongue/throat, SOB, or low BP? Yes Sudden or severe rash/hives, skin peeling, or the inside of the mouth or nose? Yes Did it require medical treatment? Yes When did it last happen? About 3 years ago    If all above answers are "NO", may proceed with cephalosporin use.   Lisinopril Hives   Advair Diskus [Fluticasone-Salmeterol] Hives   Lactose Intolerance (Gi) Diarrhea   Tizanidine Palpitations and Other (See Comments)    "made my right side numb"    Physical Exam: LMP  (LMP Unknown)    Physical Exam  Diagnostics:    Assessment and Plan: No diagnosis found.  No orders of the defined types were placed in this encounter.   There are no Patient Instructions on file for this visit.  No follow-ups on file.    Thank you for the opportunity to care for this patient.  Please do not hesitate to contact me  with questions.  Thermon Leyland, FNP [facility]

## 2024-01-05 ENCOUNTER — Ambulatory Visit: Payer: Medicaid Other | Admitting: Neurology

## 2024-01-13 DIAGNOSIS — R0609 Other forms of dyspnea: Secondary | ICD-10-CM | POA: Diagnosis not present

## 2024-01-13 DIAGNOSIS — I10 Essential (primary) hypertension: Secondary | ICD-10-CM | POA: Diagnosis not present

## 2024-01-13 DIAGNOSIS — R0602 Shortness of breath: Secondary | ICD-10-CM | POA: Diagnosis not present

## 2024-01-13 DIAGNOSIS — E785 Hyperlipidemia, unspecified: Secondary | ICD-10-CM | POA: Diagnosis not present

## 2024-01-13 DIAGNOSIS — J449 Chronic obstructive pulmonary disease, unspecified: Secondary | ICD-10-CM | POA: Diagnosis not present

## 2024-01-13 DIAGNOSIS — E1169 Type 2 diabetes mellitus with other specified complication: Secondary | ICD-10-CM | POA: Diagnosis not present

## 2024-01-13 DIAGNOSIS — R5383 Other fatigue: Secondary | ICD-10-CM | POA: Diagnosis not present

## 2024-01-25 ENCOUNTER — Other Ambulatory Visit: Payer: Self-pay

## 2024-01-25 ENCOUNTER — Encounter (HOSPITAL_COMMUNITY): Payer: Self-pay

## 2024-01-25 ENCOUNTER — Emergency Department (HOSPITAL_COMMUNITY)
Admission: EM | Admit: 2024-01-25 | Discharge: 2024-01-25 | Disposition: A | Discharge status: Home / Self Care | Attending: Emergency Medicine | Admitting: Emergency Medicine

## 2024-01-25 ENCOUNTER — Emergency Department (HOSPITAL_COMMUNITY)

## 2024-01-25 DIAGNOSIS — M7989 Other specified soft tissue disorders: Secondary | ICD-10-CM | POA: Insufficient documentation

## 2024-01-25 DIAGNOSIS — R002 Palpitations: Secondary | ICD-10-CM | POA: Insufficient documentation

## 2024-01-25 DIAGNOSIS — Z7951 Long term (current) use of inhaled steroids: Secondary | ICD-10-CM | POA: Diagnosis not present

## 2024-01-25 DIAGNOSIS — I10 Essential (primary) hypertension: Secondary | ICD-10-CM | POA: Insufficient documentation

## 2024-01-25 DIAGNOSIS — J449 Chronic obstructive pulmonary disease, unspecified: Secondary | ICD-10-CM | POA: Insufficient documentation

## 2024-01-25 DIAGNOSIS — R0602 Shortness of breath: Secondary | ICD-10-CM | POA: Insufficient documentation

## 2024-01-25 DIAGNOSIS — Z79899 Other long term (current) drug therapy: Secondary | ICD-10-CM | POA: Insufficient documentation

## 2024-01-25 DIAGNOSIS — E119 Type 2 diabetes mellitus without complications: Secondary | ICD-10-CM | POA: Insufficient documentation

## 2024-01-25 LAB — CBC WITH DIFFERENTIAL/PLATELET
Abs Immature Granulocytes: 0.01 10*3/uL (ref 0.00–0.07)
Basophils Absolute: 0 10*3/uL (ref 0.0–0.1)
Basophils Relative: 1 %
Eosinophils Absolute: 0.2 10*3/uL (ref 0.0–0.5)
Eosinophils Relative: 4 %
HCT: 32.8 % — ABNORMAL LOW (ref 36.0–46.0)
Hemoglobin: 11.3 g/dL — ABNORMAL LOW (ref 12.0–15.0)
Immature Granulocytes: 0 %
Lymphocytes Relative: 24 %
Lymphs Abs: 1.5 10*3/uL (ref 0.7–4.0)
MCH: 32.8 pg (ref 26.0–34.0)
MCHC: 34.5 g/dL (ref 30.0–36.0)
MCV: 95.1 fL (ref 80.0–100.0)
Monocytes Absolute: 0.5 10*3/uL (ref 0.1–1.0)
Monocytes Relative: 8 %
Neutro Abs: 3.9 10*3/uL (ref 1.7–7.7)
Neutrophils Relative %: 63 %
Platelets: 101 10*3/uL — ABNORMAL LOW (ref 150–400)
RBC: 3.45 MIL/uL — ABNORMAL LOW (ref 3.87–5.11)
RDW: 14.6 % (ref 11.5–15.5)
WBC: 6.2 10*3/uL (ref 4.0–10.5)
nRBC: 0 % (ref 0.0–0.2)

## 2024-01-25 LAB — TROPONIN I (HIGH SENSITIVITY): Troponin I (High Sensitivity): 15 ng/L (ref <18)

## 2024-01-25 LAB — COMPREHENSIVE METABOLIC PANEL WITH GFR
ALT: 59 U/L — ABNORMAL HIGH (ref 0–44)
AST: 96 U/L — ABNORMAL HIGH (ref 15–41)
Albumin: 2.2 g/dL — ABNORMAL LOW (ref 3.5–5.0)
Alkaline Phosphatase: 53 U/L (ref 38–126)
Anion gap: 9 (ref 5–15)
BUN: 13 mg/dL (ref 8–23)
CO2: 23 mmol/L (ref 22–32)
Calcium: 8.4 mg/dL — ABNORMAL LOW (ref 8.9–10.3)
Chloride: 112 mmol/L — ABNORMAL HIGH (ref 98–111)
Creatinine, Ser: 1.03 mg/dL — ABNORMAL HIGH (ref 0.44–1.00)
GFR, Estimated: >60 mL/min (ref >60)
Glucose, Bld: 94 mg/dL (ref 70–99)
Potassium: 3.2 mmol/L — ABNORMAL LOW (ref 3.5–5.1)
Sodium: 144 mmol/L (ref 135–145)
Total Bilirubin: 0.8 mg/dL (ref 0.0–1.2)
Total Protein: 4.7 g/dL — ABNORMAL LOW (ref 6.5–8.1)

## 2024-01-25 LAB — URINALYSIS, MICROSCOPIC (REFLEX)

## 2024-01-25 LAB — URINALYSIS, ROUTINE W REFLEX MICROSCOPIC
Bilirubin Urine: NEGATIVE
Glucose, UA: NEGATIVE mg/dL
Ketones, ur: NEGATIVE mg/dL
Leukocytes,Ua: NEGATIVE
Nitrite: NEGATIVE
Protein, ur: >300 mg/dL — AB
Specific Gravity, Urine: 1.015 (ref 1.005–1.030)
pH: 6 (ref 5.0–8.0)

## 2024-01-25 LAB — BRAIN NATRIURETIC PEPTIDE: B Natriuretic Peptide: 384.2 pg/mL — ABNORMAL HIGH (ref 0.0–100.0)

## 2024-01-25 MED ORDER — AMLODIPINE BESYLATE 10 MG PO TABS: 10.0000 mg | ORAL_TABLET | Freq: Every day | ORAL | 0 refills | Status: AC

## 2024-01-25 MED ORDER — OLMESARTAN MEDOXOMIL 20 MG PO TABS: 20.0000 mg | ORAL_TABLET | Freq: Every day | ORAL | 0 refills | Status: DC

## 2024-01-25 MED ORDER — FUROSEMIDE 20 MG PO TABS: 20.0000 mg | ORAL_TABLET | Freq: Every day | ORAL | 0 refills | Status: DC

## 2024-01-25 MED ORDER — ALBUTEROL SULFATE (2.5 MG/3ML) 0.083% IN NEBU
5.0000 mg | INHALATION_SOLUTION | Freq: Once | RESPIRATORY_TRACT | Status: AC
  Administered 2024-01-25: 5 mg via RESPIRATORY_TRACT
  Filled 2024-01-25: qty 6

## 2024-01-25 MED ORDER — IPRATROPIUM BROMIDE 0.02 % IN SOLN
0.5000 mg | Freq: Once | RESPIRATORY_TRACT | Status: AC
Start: 2024-01-25 — End: 2024-01-25
  Administered 2024-01-25: 0.5 mg via RESPIRATORY_TRACT
  Filled 2024-01-25: qty 2.5

## 2024-01-25 MED ORDER — HYDRALAZINE HCL 20 MG/ML IJ SOLN
10.0000 mg | Freq: Once | INTRAMUSCULAR | Status: AC
  Administered 2024-01-25: 10 mg via INTRAVENOUS
  Filled 2024-01-25: qty 1

## 2024-01-25 MED ORDER — KETOROLAC TROMETHAMINE 15 MG/ML IJ SOLN
15.0000 mg | Freq: Once | INTRAMUSCULAR | Status: AC
  Administered 2024-01-25: 15 mg via INTRAVENOUS
  Filled 2024-01-25: qty 1

## 2024-01-25 NOTE — ED Triage Notes (Addendum)
 [facility] with a c/o of SOB, palpitations and headaches off and on for the last month which have gotten worse since last 3 days.PT also has swelling in legs for the last 3 months. PT has been off BP med for 4 weeks due to physician advice. Last BP WITH ems WAS : 284/120 bp 64 HR 18 RR 98%RA  and CBG:116. Chest Xray at [facility] showed pleural effusion.

## 2024-01-25 NOTE — Discharge Instructions (Signed)
 Please keep the appointment scheduled with cardiology for the 20th of this month. Take Norvasc , Losartan  and Lasix as prescribed.   Return to the ED with any new or concerning symptoms.

## 2024-01-25 NOTE — ED Notes (Signed)
 Patient ambulated to the restroom independently, oxygen saturation was 94% when she got back in the room

## 2024-01-25 NOTE — ED Provider Notes (Signed)
 [facility] Provider Note   CSN: [id number] Arrival date & time: 01/25/24  1552     History  Chief Complaint  Patient presents with   Shortness of Breath   Leg Swelling   Palpitations    Susan Hogan is a 63 y.o. female.  Patient to ED with shortness of breath, progressively worsening lower extremity swelling. Symptoms have been coming on over 3 weeks, worse in the last 3 days. No chest pain, fever, cough. She reports sleeping with the head of her bed inclined by this is not new. She was seen by her primary care doctor on 4/24 and is being referred to cardiology.   The history is provided by the patient. No language interpreter was used.  Shortness of Breath Palpitations Associated symptoms: shortness of breath        Home Medications Prior to Admission medications   Medication Sig Start Date End Date Taking? Authorizing Provider  furosemide  (LASIX ) 20 MG tablet Take 1 tablet (20 mg total) by mouth daily. 01/25/24  Yes Mandy Second, PA-C  albuterol  (VENTOLIN  HFA) 108 (90 Base) MCG/ACT inhaler Inhale 1-2 puffs into the lungs every 6 (six) hours as needed for wheezing or shortness of breath. 02/10/23   Antonio Baumgarten, NP  amLODipine  (NORVASC ) 10 MG tablet Take 1 tablet (10 mg total) by mouth daily. 01/25/24 02/24/24  Mandy Second, PA-C  Atogepant  (QULIPTA ) 60 MG TABS Take 1 tablet (60 mg total) by mouth daily. 01/28/24   Merriam Abbey, DO  Azelastine -Fluticasone  (DYMISTA ) 137-50 MCG/ACT SUSP Place 2 sprays into both nostrils 1 day or 1 dose. 05/14/23   Brian Campanile, MD  cetirizine  (ZYRTEC ) 10 MG tablet Take 1 tablet (10 mg total) by mouth daily as needed for allergies. 05/14/23   Brian Campanile, MD  clarithromycin  (BIAXIN ) 500 MG tablet Take 1 tablet (500 mg total) by mouth 2 (two) times daily. 11/16/23   Margaretann Sharper, MD  fluticasone  (FLONASE ) 50 MCG/ACT nasal spray Place 1 spray into both nostrils daily as  needed for allergies. 12/19/19   [provider]  fluticasone  (FLOVENT  HFA) 110 MCG/ACT inhaler Inhale 2 puffs into the lungs 2 (two) times daily. 05/14/23   Brian Campanile, MD  levocetirizine (XYZAL) 5 MG tablet Take 5 mg by mouth every evening. 08/12/23   [provider]  loratadine  (CLARITIN ) 10 MG tablet Take by mouth. 04/19/23   [provider]  olmesartan  (BENICAR ) 20 MG tablet Take 1 tablet (20 mg total) by mouth daily. 01/25/24   Mandy Second, PA-C  Olopatadine  HCl (PATADAY ) 0.2 % SOLN Place 1 drop into both eyes 1 day or 1 dose. Patient not taking: Reported on 01/28/2024 05/14/23   Brian Campanile, MD  dicyclomine  (BENTYL ) 20 MG tablet Take 1 tablet (20 mg total) by mouth 2 (two) times daily. Patient not taking: Reported on 10/01/2019 08/19/19 10/01/19  Joy, Zackery Herring, PA-C  diphenhydrAMINE  (BENADRYL ) 25 MG tablet Take 1 tablet (25 mg total) by mouth every 6 (six) hours as needed. Patient not taking: Reported on 10/01/2019 09/24/19 10/01/19  Petrucelli, Samantha R, PA-C  escitalopram  (LEXAPRO ) 10 MG tablet TAKE 1 TABLET(10 MG) BY MOUTH DAILY Patient not taking: Reported on 07/05/2018 06/01/18 09/24/19  Jonn Nett, DO  labetalol  (NORMODYNE ) 100 MG tablet Take 1 tablet (100 mg total) by mouth 2 (two) times daily. Patient not taking: Reported on 10/01/2019 08/30/19 10/01/19  Zammit, Joseph, MD  topiramate  (TOPAMAX ) 100 MG tablet Take  1 tablet (100 mg total) by mouth at bedtime. Patient not taking: Reported on 10/01/2019 03/09/19 10/01/19  Glory Larsen, MD      Allergies    Anesthetic ether [ether], Nubain [nalbuphine hcl], Penicillins, Lisinopril , Advair diskus [fluticasone -salmeterol], Lactose intolerance (gi), and Tizanidine     Review of Systems   Review of Systems  Respiratory:  Positive for shortness of breath.   Cardiovascular:  Positive for palpitations.    Physical Exam Updated Vital Signs BP (!) 182/79   Pulse 84   Temp 98.3 F (36.8 C)  (Oral)   Resp 20   Ht 4\' 11"  (1.499 m)   Wt 98.8 kg   LMP  (LMP Unknown)   SpO2 100%   BMI 43.99 kg/m  Physical Exam Vitals and nursing note reviewed.  Constitutional:      Appearance: She is well-developed.  HENT:     Head: Normocephalic.  Cardiovascular:     Rate and Rhythm: Normal rate and regular rhythm.     Heart sounds: No murmur heard. Pulmonary:     Effort: Pulmonary effort is normal.     Breath sounds: Rales (Mild bibasilar rales) present. No wheezing or rhonchi.  Chest:     Chest wall: No tenderness.  Abdominal:     General: Bowel sounds are normal.     Palpations: Abdomen is soft.     Tenderness: There is no abdominal tenderness. There is no guarding or rebound.  Musculoskeletal:        General: Normal range of motion.     Cervical back: Normal range of motion and neck supple.     Comments: Legs are swollen bilaterally without significant pitting. No redness. Pulses present.   Skin:    General: Skin is warm and dry.  Neurological:     General: No focal deficit present.     Mental Status: She is alert and oriented to person, place, and time.     ED Results / Procedures / Treatments   Labs (all labs ordered are listed, but only abnormal results are displayed) Labs Reviewed  CBC WITH DIFFERENTIAL/PLATELET - Abnormal; Notable for the following components:      Result Value   RBC 3.45 (*)    Hemoglobin 11.3 (*)    HCT 32.8 (*)    Platelets 101 (*)    All other components within normal limits  COMPREHENSIVE METABOLIC PANEL WITH GFR - Abnormal; Notable for the following components:   Potassium 3.2 (*)    Chloride 112 (*)    Creatinine, Ser 1.03 (*)    Calcium  8.4 (*)    Total Protein 4.7 (*)    Albumin 2.2 (*)    AST 96 (*)    ALT 59 (*)    All other components within normal limits  BRAIN NATRIURETIC PEPTIDE - Abnormal; Notable for the following components:   B Natriuretic Peptide 384.2 (*)    All other components within normal limits  URINALYSIS,  ROUTINE W REFLEX MICROSCOPIC - Abnormal; Notable for the following components:   Hgb urine dipstick LARGE (*)    Protein, ur >300 (*)    All other components within normal limits  URINALYSIS, MICROSCOPIC (REFLEX) - Abnormal; Notable for the following components:   Bacteria, UA RARE (*)    All other components within normal limits  TROPONIN I (HIGH SENSITIVITY)    EKG EKG Interpretation Date/Time:  Tuesday Jan 25 2024 16:06:28 EDT Ventricular Rate:  65 PR Interval:  161 QRS Duration:  102 QT Interval:  476 QTC Calculation: 495 R Axis:   29  Text Interpretation: Sinus rhythm Low voltage, precordial leads Borderline T abnormalities, inferior leads Borderline prolonged QT interval when compared top rior, similar appearance No STEMI Confirmed by Wynell Heath ([id number]) on 01/25/2024 7:56:31 PM  Radiology No results found.  Procedures Procedures    Medications Ordered in ED Medications  hydrALAZINE  (APRESOLINE ) injection 10 mg (10 mg Intravenous Given 01/25/24 2008)  albuterol  (PROVENTIL ) (2.5 MG/3ML) 0.083% nebulizer solution 5 mg (5 mg Nebulization Given 01/25/24 2007)  ipratropium (ATROVENT ) nebulizer solution 0.5 mg (0.5 mg Nebulization Given 01/25/24 2007)  ketorolac  (TORADOL ) 15 MG/ML injection 15 mg (15 mg Intravenous Given 01/25/24 2009)    ED Course/ Medical Decision Making/ A&P Clinical Course as of 02/05/24 1418  Tue Jan 25, 2024  1641 Attempted to see the patient but she wanted to finish her phone call first.  [SU]    Clinical Course User Index [SU] Mandy Second, PA-C                                 Medical Decision Making This patient presents to the ED for concern of SOB, this involves an extensive number of treatment options, and is a complaint that carries with it a high risk of complications and morbidity.  The differential diagnosis includes CHF, PNA, PE, PTX   Co morbidities that complicate the patient evaluation  Morbid obesity, T2DM, HTN IBS, hepatitis C,  arthritis, kidney stones, fibromyalgia, COPD   Additional history obtained:  Additional history and/or information obtained from chart review, notable for Recent PCP office visit 4/24: "Alayah was seen today for follow-up and fatigue.  Diagnoses and all orders for this visit:  Shortness of breath DOE (dyspnea on exertion) - Ambulatory referral to Cardiology  Other fatigue Fibromyalgia contributing to this?  Essential hypertension Continue medications  Type 2 diabetes mellitus with hyperlipidemia (*) Controlled with di4t and and exercise  Chronic obstructive pulmonary disease, unspecified COPD type (*) Has albuterol  as needed Has not had to use this."     Lab Tests:  I Ordered, and personally interpreted labs.  The pertinent results include:  WBC 6.2, Hgb 11.3; BNP 384, Troponin 15; UA +protein, 6-10 RBC; Na 144, K+ 3.2, Cl 112, Cr 1.03, AST 96, ALT 59    Imaging Studies ordered:  I ordered imaging studies including CXR Per radiologist interpretation: Lingular ATX   Cardiac Monitoring:  The patient was maintained on a cardiac monitor.  I personally viewed and interpreted the cardiac monitored which showed an underlying rhythm of: nonischemic sinus rhythm   Medicines ordered and prescription drug management:  I ordered medication including Hydralazine   for HTN Reevaluation of the patient after these medicines showed that the patient improved I gave Albuterol /Atrovent  for symptoms of SOB with improvement I gave Toradol  for discomfort with improvement   Test Considered:  N/a   Critical Interventions:  N/a   Consultations Obtained:  I requested consultation with the n/a,  and discussed lab and imaging findings as well as pertinent plan - they recommend: n/a   Problem List / ED Course:  Presentation for SOB, concern for fluid overload No fever, no chest pain Labs and CXR reassuring with minimal BNP of 384, negative troponin, CXR without edema She  feels better after nebulized treatment Blood pressure improved after IV Hydralazine  Stable for discharge home Encouraged close follow up with PCP   Reevaluation:  After the interventions noted  above, I reevaluated the patient and found that they have :improved   Social Determinants of Health:  Never a smoker   Disposition:  After consideration of the diagnostic results and the patients response to treatment, I feel that the patient would benefit from discharge to home.   Amount and/or Complexity of Data Reviewed Labs: ordered. Radiology: ordered.  Risk Prescription drug management.           Final Clinical Impression(s) / ED Diagnoses Final diagnoses:  Shortness of breath    Rx / DC Orders ED Discharge Orders          Ordered    furosemide  (LASIX ) 20 MG tablet  Daily        01/25/24 2206    amLODipine  (NORVASC ) 10 MG tablet  Daily        01/25/24 2206    olmesartan  (BENICAR ) 20 MG tablet  Daily        01/25/24 2206              Mandy Second, PA-C 02/05/24 1418    Tegeler, Marine Sia, MD 02/05/24 [id number]

## 2024-01-25 NOTE — ED Notes (Signed)
 PT ambulated to the bathroom w/o assistance
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
## 2024-01-28 ENCOUNTER — Encounter: Payer: Self-pay | Admitting: Neurology

## 2024-01-28 ENCOUNTER — Ambulatory Visit (INDEPENDENT_AMBULATORY_CARE_PROVIDER_SITE_OTHER): Admitting: Neurology

## 2024-01-28 VITALS — BP 180/80 | HR 78 | Ht 59.0 in | Wt 210.0 lb

## 2024-01-28 DIAGNOSIS — G43009 Migraine without aura, not intractable, without status migrainosus: Secondary | ICD-10-CM

## 2024-01-28 MED ORDER — QULIPTA 60 MG PO TABS: 60.0000 mg | ORAL_TABLET | Freq: Every day | ORAL | 5 refills | Status: DC

## 2024-01-28 NOTE — Patient Instructions (Addendum)
  Start Qulipta 60mg  daily.  If no improvement in 3 months, contact. Take Nurtec at earliest onset of headache.  Maximum 1 tablet daily as needed.  Let me know if you want a prescription Limit use of pain relievers to no more than 9 days out of the month.  These medications include acetaminophen , NSAIDs (ibuprofen /Advil /Motrin , naproxen /Aleve , triptans (Imitrex /sumatriptan ), Excedrin, and narcotics.  This will help reduce risk of rebound headaches. Be aware of common food triggers Routine exercise Stay adequately hydrated (aim for 64 oz water daily) Keep headache diary Maintain proper stress management Maintain proper sleep hygiene Do not skip meals

## 2024-02-10 ENCOUNTER — Telehealth: Payer: Self-pay

## 2024-02-10 ENCOUNTER — Other Ambulatory Visit (HOSPITAL_COMMUNITY): Payer: Self-pay

## 2024-02-10 ENCOUNTER — Telehealth: Payer: Self-pay | Admitting: Pharmacy Technician

## 2024-02-10 NOTE — Telephone Encounter (Signed)
 PA has been submitted, and telephone encounter has been created. Please see telephone encounter dated 5.22.25.

## 2024-02-10 NOTE — Telephone Encounter (Signed)
 Letter received from [facility], Patient given a temporary supply of Qulipta  on 01/31/24. This is because the Drug us  not on the plan drug list or is subject to certain limits.    Patient will need a PA for Qulipta 

## 2024-02-10 NOTE — Telephone Encounter (Signed)
 Pharmacy Patient Advocate Encounter   Received notification from Pt Calls Messages that prior authorization for QULIPTA  60MG  is required/requested.   Insurance verification completed.   The patient is insured through [employer] .   Per test claim: PA required; PA submitted to above mentioned insurance via CoverMyMeds Key/confirmation #/EOC [id number] Status is pending

## 2024-02-15 ENCOUNTER — Ambulatory Visit (INDEPENDENT_AMBULATORY_CARE_PROVIDER_SITE_OTHER): Payer: Medicare Other | Admitting: Pulmonary Disease

## 2024-02-15 ENCOUNTER — Encounter: Payer: Self-pay | Admitting: Pulmonary Disease

## 2024-02-15 VITALS — BP 160/98 | HR 72 | Ht 59.0 in | Wt 201.0 lb

## 2024-02-15 DIAGNOSIS — J4489 Other specified chronic obstructive pulmonary disease: Secondary | ICD-10-CM

## 2024-02-15 DIAGNOSIS — J4530 Mild persistent asthma, uncomplicated: Secondary | ICD-10-CM

## 2024-02-15 DIAGNOSIS — Z6841 Body Mass Index (BMI) 40.0 and over, adult: Secondary | ICD-10-CM

## 2024-02-15 DIAGNOSIS — Z7722 Contact with and (suspected) exposure to environmental tobacco smoke (acute) (chronic): Secondary | ICD-10-CM

## 2024-02-15 NOTE — Progress Notes (Signed)
 Susan Hogan    [id number]    1960/12/11  Primary Care Physician:Beal, Lurena Sally  Referring Physician: Gerrianne Krauss, PA-C [address] [place],  [place] [address]  Chief complaint:   Asthma with uncontrolled symptoms In for follow-up today  HPI:  In for follow-up today  Despite the use of antibiotics and steroids, did not make any difference to her breathing  Still short of breath with activity Not really coughing or bringing up any secretions at present  Has used multiple inhalers previously which she stated did not really help, use of Symbicort, Breo previously with no improvement in symptoms, also prescribed Anoro in the past-was not filled  Stated she grew up with asthma  Has had 2 previous infections with COVID 1 in 2020 and 1 in April 2024  Continues to experience shortness of breath with activity  She feels her breathing is getting worse  She does use Flonase   History of asthma COPD overlap Exposure to secondhand smoke in the past   Outpatient Encounter Medications as of 02/15/2024  Medication Sig   albuterol  (VENTOLIN  HFA) 108 (90 Base) MCG/ACT inhaler Inhale 1-2 puffs into the lungs every 6 (six) hours as needed for wheezing or shortness of breath.   amLODipine  (NORVASC ) 10 MG tablet Take 1 tablet (10 mg total) by mouth daily.   Atogepant  (QULIPTA ) 60 MG TABS Take 1 tablet (60 mg total) by mouth daily.   Azelastine -Fluticasone  (DYMISTA ) 137-50 MCG/ACT SUSP Place 2 sprays into both nostrils 1 day or 1 dose.   cetirizine  (ZYRTEC ) 10 MG tablet Take 1 tablet (10 mg total) by mouth daily as needed for allergies.   clarithromycin  (BIAXIN ) 500 MG tablet Take 1 tablet (500 mg total) by mouth 2 (two) times daily.   fluticasone  (FLONASE ) 50 MCG/ACT nasal spray Place 1 spray into both nostrils daily as needed for allergies.   fluticasone  (FLOVENT  HFA) 110 MCG/ACT inhaler Inhale 2 puffs into the lungs 2 (two) times daily.   furosemide  (LASIX ) 20 MG tablet  Take 1 tablet (20 mg total) by mouth daily.   hydrochlorothiazide (HYDRODIURIL) 25 MG tablet Take 25 mg by mouth daily.   levocetirizine (XYZAL) 5 MG tablet Take 5 mg by mouth every evening.   loratadine  (CLARITIN ) 10 MG tablet Take by mouth.   olmesartan  (BENICAR ) 20 MG tablet Take 1 tablet (20 mg total) by mouth daily.   Olopatadine  HCl (PATADAY ) 0.2 % SOLN Place 1 drop into both eyes 1 day or 1 dose.   [DISCONTINUED] dicyclomine  (BENTYL ) 20 MG tablet Take 1 tablet (20 mg total) by mouth 2 (two) times daily. (Patient not taking: Reported on 10/01/2019)   [DISCONTINUED] diphenhydrAMINE  (BENADRYL ) 25 MG tablet Take 1 tablet (25 mg total) by mouth every 6 (six) hours as needed. (Patient not taking: Reported on 10/01/2019)   [DISCONTINUED] escitalopram  (LEXAPRO ) 10 MG tablet TAKE 1 TABLET(10 MG) BY MOUTH DAILY (Patient not taking: Reported on 07/05/2018)   [DISCONTINUED] labetalol  (NORMODYNE ) 100 MG tablet Take 1 tablet (100 mg total) by mouth 2 (two) times daily. (Patient not taking: Reported on 10/01/2019)   [DISCONTINUED] topiramate  (TOPAMAX ) 100 MG tablet Take 1 tablet (100 mg total) by mouth at bedtime. (Patient not taking: Reported on 10/01/2019)   No facility-administered encounter medications on file as of 02/15/2024.    Allergies as of 02/15/2024 - Review Complete 02/15/2024  Allergen Reaction Noted   Anesthetic ether [ether] Shortness Of Breath 11/25/2011   Nubain [nalbuphine hcl] Anaphylaxis 06/14/2012  Penicillins Hives and Shortness Of Breath    Lisinopril  Hives 12/19/2018   Advair diskus [fluticasone -salmeterol] Hives 03/16/2013   Lactose intolerance (gi) Diarrhea 11/24/2013   Tizanidine  Palpitations and Other (See Comments) 02/19/2020    Past Medical History:  Diagnosis Date   Anemia    Arthritis    RA "states does not see Rheumatologist"   Asthma    Carpal tunnel syndrome    bilaterally   Complication of anesthesia    difficulty breathing   COPD (chronic obstructive  pulmonary disease) (HCC)    COVID-19    Degenerative disk disease    back   Depression    Diabetes mellitus    Fibromyalgia    GERD (gastroesophageal reflux disease)    Headache(784.0)    migraines   Heart murmur    Hepatitis    In followup treatment for hepatitis C   Hypertension    no medication for at this time, sees Dr. Cole Daubs Pcp   Hypotensive episode    hx of   Irritable bowel syndrome    Kidney stone    with hematuria   Memory loss    Migraines    Recurrent upper respiratory infection (URI)    Shortness of breath    Syncope     Past Surgical History:  Procedure Laterality Date   CHOLECYSTECTOMY  2019   DILATION AND CURETTAGE OF UTERUS     Left knee surgery     LIVER BIOPSY     TONSILLECTOMY     TUBAL LIGATION      Family History  Problem Relation Age of Onset   Heart disease Mother    Diabetes Mother    Asthma Mother    Anemia Mother    Cerebral aneurysm Mother        ruptured   Kidney failure Mother    Migraines Mother    Parkinson's disease Mother    Colon cancer Maternal Uncle    Heart disease Maternal Uncle    Heart disease Maternal Grandmother    Diabetes Maternal Grandmother    Asthma Maternal Grandmother    Heart Problems Maternal Grandmother    Heart Problems Other        through family   Diabetes Granddaughter    Diabetes Daughter     Social History   Socioeconomic History   Marital status: Single    Spouse name: Not on file   Number of children: 7   Years of education: Not on file   Highest education level: Not on file  Occupational History   Occupation: Disabled  Tobacco Use   Smoking status: Never    Passive exposure: Never   Smokeless tobacco: Never  Vaping Use   Vaping status: Never Used  Substance and Sexual Activity   Alcohol use: Never    Alcohol/week: 0.0 standard drinks of alcohol   Drug use: Never   Sexual activity: Not on file  Other Topics Concern   Not on file  Social History Narrative   Lives  at home. Has two sons and her grandchildren with her.    Right handed   Caffeine : very rare   Social Drivers of Corporate investment banker Strain: Low Risk  (01/13/2024)   Received from [facility]   Overall Financial Resource Strain (CARDIA)    Difficulty of Paying Living Expenses: Not hard at all  Recent Concern: Financial Resource Strain - Medium Risk (11/02/2023)   Received from [facility]   Overall Financial Resource  Strain (CARDIA)    Difficulty of Paying Living Expenses: Somewhat hard  Food Insecurity: Food Insecurity Present (01/13/2024)   Received from [facility]   Hunger Vital Sign    Worried About Running Out of Food in the Last Year: Sometimes true    Ran Out of Food in the Last Year: Sometimes true  Transportation Needs: Unmet Transportation Needs (01/13/2024)   Received from [facility] - Transportation    Lack of Transportation (Medical): Yes    Lack of Transportation (Non-Medical): Yes  Physical Activity: Insufficiently Active (01/13/2024)   Received from [facility]   Exercise Vital Sign    Days of Exercise per Week: 2 days    Minutes of Exercise per Session: 30 min  Stress: Stress Concern Present (01/13/2024)   Received from [facility] - Occupational Stress Questionnaire    Feeling of Stress : Very much  Social Connections: Socially Isolated (01/13/2024)   Received from [facility]   Social Network    How would you rate your social network (family, work, friends)?: Little participation, lonely and socially isolated  Intimate Partner Violence: Not At Risk (02/03/2024)   Received from [facility]   HITS    Over the last 12 months how often did your partner physically hurt you?: Never    Over the last 12 months how often did your partner insult you or talk down to you?: Never    Over the last 12 months how often did your partner threaten you with physical harm?: Never    Over the last 12 months  how often did your partner scream or curse at you?: Never    Review of Systems  HENT:  Positive for congestion.   Respiratory:  Positive for shortness of breath and wheezing.     Vitals:   02/15/24 1539  BP: (!) 160/98  Pulse: 72  SpO2: 98%     Physical Exam Constitutional:      Appearance: She is obese.  HENT:     Head: Normocephalic.     Mouth/Throat:     Mouth: Mucous membranes are moist.  Eyes:     Pupils: Pupils are equal, round, and reactive to light.  Cardiovascular:     Rate and Rhythm: Normal rate and regular rhythm.     Heart sounds: No murmur heard.    No friction rub.  Pulmonary:     Effort: No respiratory distress.     Breath sounds: No stridor. No wheezing or rhonchi.  Musculoskeletal:     Cervical back: No rigidity or tenderness.  Neurological:     Mental Status: She is alert.  Psychiatric:        Mood and Affect: Mood normal.    Data Reviewed: Spirometry 05/17/2023-normal spirometry  CT scan 02/14/2022 reviewed showing some dependent atelectasis  Assessment:  Chronic persistent asthma - Did not feel any of the inhalers helped in the past  Worsening symptoms  No improvement with recent treatment for a bronchitic exacerbation  Deconditioning likely playing a significant role in shortness of breath as well  Did offer a trial of inhalers which she declined  Offered a second opinion appointment   Plan/Recommendations: Will schedule to see one of the other attendings for second opinion regarding symptoms  Will continue to use albuterol  as needed  Encouraged to call if any significant concerns  Myer Artis MD [facility] 02/15/2024, 3:42 PM See MRSA thank you CC:  Gerrianne Krauss, PA-C

## 2024-02-15 NOTE — Patient Instructions (Addendum)
 We will make an appointment for second opinion with one of my colleagues  Continue using albuterol  as needed  We can consider putting you back on Breo or Symbicort or similar inhaler to see if with the help  Avoid known triggers

## 2024-02-22 NOTE — Telephone Encounter (Signed)
 Approved on May 22 by [employer] 2017 NCPDP Request Reference Number: [id number]. QULIPTA  TAB 60MG  is approved through 09/20/2024. Your patient may now fill this prescription and it will be covered. Effective Date: 02/10/2024 Authorization Expiration Date: 09/20/2024

## 2024-05-08 ENCOUNTER — Telehealth: Payer: Self-pay | Admitting: Pulmonary Disease

## 2024-05-08 DIAGNOSIS — J4489 Other specified chronic obstructive pulmonary disease: Secondary | ICD-10-CM

## 2024-05-08 NOTE — Telephone Encounter (Signed)
 Patient called to cancel appt with Dr. Meade.   Was originally scheduled with Dr. Meade for a TOC on 05/09/2024 and had previously been established with Dr. Neda. As suggested on her previous visit, she was to f/u with one of his colleagues.   She explained that at her last visit she suggested receiving shots and that it was refused by the provider, but other than changing inhalers, she does not feel as there are any other attempts. Pt also stated that ultimately at her visit it was stated that she has to live with this. Pt did not feel that it was appropriate to suggest.   Pt also discussed the recent hospital encounter at [facility] and that they did give her a breathing treatment, in which did assist with her symptoms at the time. Pt was then d/c and coordinated to f/u with Cardiology.  Asked if there was anything I could do to assist with rescheduling with another provider to ensure her care was handled. She refused and stated she would rather move to [place]. I did let her know I can work with staff to see about her transition to [facility] and to have our Production designer, theatre/television/film f/u with her. She was pleased and agreed with this.   Routing Admin & Provider - Is it possible to place a referral or could she TOC with her records in Care Everywhere?

## 2024-05-09 ENCOUNTER — Encounter: Admitting: Internal Medicine

## 2024-05-11 NOTE — Telephone Encounter (Signed)
 Would a referral need to be placed or can we just tell her to call medical records to transfer all her information?

## 2024-05-11 NOTE — Telephone Encounter (Signed)
 Kindly assist her with transition as possible.

## 2024-05-12 NOTE — Telephone Encounter (Signed)
 Referral to [facility] has been placed.  Patient is aware and voiced her understanding.  Nothing further needed.

## 2024-06-04 ENCOUNTER — Emergency Department (HOSPITAL_COMMUNITY)
Admission: EM | Admit: 2024-06-04 | Discharge: 2024-06-05 | Disposition: A | Discharge status: Home / Self Care | Attending: Emergency Medicine | Admitting: Emergency Medicine

## 2024-06-04 ENCOUNTER — Other Ambulatory Visit: Payer: Self-pay

## 2024-06-04 ENCOUNTER — Encounter (HOSPITAL_COMMUNITY): Payer: Self-pay

## 2024-06-04 DIAGNOSIS — Z79899 Other long term (current) drug therapy: Secondary | ICD-10-CM | POA: Diagnosis not present

## 2024-06-04 DIAGNOSIS — M545 Low back pain, unspecified: Secondary | ICD-10-CM | POA: Insufficient documentation

## 2024-06-04 DIAGNOSIS — E119 Type 2 diabetes mellitus without complications: Secondary | ICD-10-CM | POA: Diagnosis not present

## 2024-06-04 DIAGNOSIS — R519 Headache, unspecified: Secondary | ICD-10-CM | POA: Diagnosis not present

## 2024-06-04 DIAGNOSIS — M542 Cervicalgia: Secondary | ICD-10-CM | POA: Diagnosis present

## 2024-06-04 DIAGNOSIS — Y92410 Unspecified street and highway as the place of occurrence of the external cause: Secondary | ICD-10-CM | POA: Insufficient documentation

## 2024-06-04 MED ORDER — ACETAMINOPHEN 325 MG PO TABS
650.0000 mg | ORAL_TABLET | Freq: Once | ORAL | Status: AC
Start: 2024-06-05 — End: 2024-06-04
  Administered 2024-06-04: 650 mg via ORAL
  Filled 2024-06-04: qty 2

## 2024-06-04 MED ORDER — LIDOCAINE 5 % EX PTCH
2.0000 | MEDICATED_PATCH | CUTANEOUS | Status: DC
  Administered 2024-06-04: 2 via TRANSDERMAL
  Filled 2024-06-04: qty 2
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
## 2024-06-04 NOTE — ED Notes (Signed)
Pt provided urine sample.  Sent to lab.

## 2024-06-05 MED ORDER — METHOCARBAMOL 500 MG PO TABS: 500.0000 mg | ORAL_TABLET | Freq: Three times a day (TID) | ORAL | 0 refills | Status: AC | PRN
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
## 2024-06-29 NOTE — Progress Notes (Signed)
 [address] - AMBULATORY [facility] [address] [place] [address]   Date of Service: 06/29/2024 Patient DOB: 1961/08/03    History of Present Illness   Patient ID: Susan Hogan is a 63 y.o. female. Patient with PMH of obesity, HTN, HF, pripheral edema, CKD, chronic hep C, IBS presents to urgent care due to concerns.  Patient concerned for whole body inflammation.  Patient reports epigastric discomfort and abdominal bloating for > 3 months, but feels that abdominal bloating has significantly increased over the past few days.  Also reports significant worsening of bilateral peripheral edema despite taking diuretics and medications prescribed by cardiology.  Associated increased shortness of breath with daily activities.  States that she currently has on a Holter monitor.  States that she has seen GI in the past for similar symptoms.  Does report some constipation over the past week in which she took MiraLAX  which should resolve symptoms.  Denies fever, decreased appetite or p.o. intake, chest pain, nausea, vomiting, diarrhea, urinary symptoms, dark or bloody stools.   Active Ambulatory Problems    Diagnosis Date Noted  . Referred ear pain 09/28/2019  . Arthralgia of left temporomandibular joint 10/10/2019   Resolved Ambulatory Problems    Diagnosis Date Noted  . No Resolved Ambulatory Problems   Past Medical History:  Diagnosis Date  . Diabetes mellitus   . GERD (gastroesophageal reflux disease)   . Hepatitis   . Hypertension     BP 180/99   Pulse 88   Ht 1.499 m (4' 11)   Wt 166 kg (365 lb)   BMI 73.72 kg/m    Review of Systems   Review of Systems  Respiratory:  Positive for shortness of breath.   Cardiovascular:  Positive for leg swelling.  Gastrointestinal:  Positive for abdominal pain.     Physicial Exam   Physical Exam Vitals and nursing note reviewed.  Constitutional:       Appearance: Normal appearance. She is obese. She is not ill-appearing or diaphoretic.  HENT:     Head: Normocephalic and atraumatic.     Right Ear: External ear normal.     Left Ear: External ear normal.     Nose: Nose normal.     Mouth/Throat:     Mouth: Mucous membranes are moist.     Pharynx: Oropharynx is clear.  Eyes:     Extraocular Movements: Extraocular movements intact.     Conjunctiva/sclera: Conjunctivae normal.  Cardiovascular:     Rate and Rhythm: Normal rate and regular rhythm.     Pulses: Normal pulses.     Heart sounds: Murmur (Systolic) heard.  Pulmonary:     Effort: Pulmonary effort is normal.     Breath sounds: Normal breath sounds.  Abdominal:     General: Abdomen is flat. Bowel sounds are normal. There is no distension.     Palpations: Abdomen is soft. There is no mass.     Tenderness: There is generalized abdominal tenderness. There is no guarding or rebound.     Hernia: No hernia is present.     Comments: Protuberant  Musculoskeletal:        General: Normal range of motion.     Cervical back: Normal range of motion and neck supple.     Right lower leg: Edema present.     Left lower leg: Edema (Bilateral 2+ pitting edema) present.  Skin:    General: Skin is  warm and dry.  Neurological:     General: No focal deficit present.     Mental Status: She is alert and oriented to person, place, and time.  Psychiatric:        Mood and Affect: Mood normal.     Diagnosis   Susan was seen today for abdominal pain.  Diagnoses and all orders for this visit:  Generalized abdominal pain  Dyspnea on exertion     Medical Decision Making Susan Hogan is a 63 y.o. female who presents to urgent care today with complaints of  Chief Complaint  Patient presents with  . Abdominal Pain    Reported generalized epigastric pain that radiates all the way down to her feet. Started 3 months ago. Reported being hospitalized before for [facility], Hypertension, and  diahrrea. Reporting now having swelling. Is worried for Crones disease. Pitting edema noted to legs.     On exam, VS stable and patient is afebrile. Appears well-hydrated and capillary refill <2 seconds.  DDx: appendicitis, bowel obstruction, AAA, UTI, pyelonephritis, nephrolithiasis, pancreatitis, cholecystitis, shingles, perforated bowel or ulcer, diverticulitis, mesenteric ischemia, inflammatory bowel disease, or strangulated/incarcerated hernia  Patient presents to urgent care due to multiple concerns. On exam, patient does appear to have generalized abdominal tenderness with 2+ pitting edema bilateral lower extremities concerning for fluid overload.  Patient reports acute on chronic epigastric abdominal pain with increased shortness of breath and peripheral edema.  Given HPI and PE findings, I recommend patient be further evaluated in ED.   Discharge Information  Symptomatic management discussed.  Patient was given verbal and written instructions on symptoms that necessitate return to the UC/ED, and instructed to f/u w/ UC or PCP if not improving in expected timeframe.   Patient/parent has been instructed on RX/OTC medications, dosages, side effects, and possible interactions as associated with each diagnosis in my impression and plan above.   Patient education (verbal/handout) given on diagnosis, pathophysiology, treatment of diagnosis, side effects of medication use for treatment, restrictions while taking medication, supportives measures such as staying hydrated.   Red Flags associated with diagnosis/es were reviewed and patient instructed on action plan if red flags develop.   They have been instructed that if symptoms worsen or red flags develop they should return to Urgent Care, go to the nearest ED, or activate EMS/911.     Patient and/or parent/guardian (if applicable) agreed with plan and voiced understanding.  No barriers to adherence perceived by myself.   Portions of this note  may have been dictated using Dragon dictation software/hardware and may contain grammatical or spelling errors.    If a new prescription was given today, then I discussed potential side effects, drug interactions, instructions for taking the medication, and the consequences of not taking it.    F/u: Follow up closely with primary care provider (PCP) and other specialists for further care and routine care, but seek medical attention sooner if worsening/concerning signs or symptoms.  Follow up with ED   Electronically signed by: Darryle Slater Fish, PA-C 06/29/2024 5:24 PM

## 2024-07-11 ENCOUNTER — Inpatient Hospital Stay (HOSPITAL_BASED_OUTPATIENT_CLINIC_OR_DEPARTMENT_OTHER)
Admission: EM | Admit: 2024-07-11 | Discharge: 2024-07-17 | DRG: 291 | Disposition: A | Discharge status: Home / Self Care | Attending: Internal Medicine | Admitting: Internal Medicine

## 2024-07-11 ENCOUNTER — Emergency Department (HOSPITAL_BASED_OUTPATIENT_CLINIC_OR_DEPARTMENT_OTHER)

## 2024-07-11 ENCOUNTER — Encounter (HOSPITAL_COMMUNITY): Payer: Self-pay | Admitting: Internal Medicine

## 2024-07-11 ENCOUNTER — Other Ambulatory Visit: Payer: Self-pay

## 2024-07-11 DIAGNOSIS — E785 Hyperlipidemia, unspecified: Secondary | ICD-10-CM | POA: Diagnosis present

## 2024-07-11 DIAGNOSIS — I130 Hypertensive heart and chronic kidney disease with heart failure and stage 1 through stage 4 chronic kidney disease, or unspecified chronic kidney disease: Principal | ICD-10-CM | POA: Diagnosis present

## 2024-07-11 DIAGNOSIS — Z800 Family history of malignant neoplasm of digestive organs: Secondary | ICD-10-CM

## 2024-07-11 DIAGNOSIS — K5730 Diverticulosis of large intestine without perforation or abscess without bleeding: Secondary | ICD-10-CM | POA: Diagnosis present

## 2024-07-11 DIAGNOSIS — K529 Noninfective gastroenteritis and colitis, unspecified: Secondary | ICD-10-CM | POA: Diagnosis present

## 2024-07-11 DIAGNOSIS — M069 Rheumatoid arthritis, unspecified: Secondary | ICD-10-CM | POA: Diagnosis present

## 2024-07-11 DIAGNOSIS — Z556 Problems related to health literacy: Secondary | ICD-10-CM

## 2024-07-11 DIAGNOSIS — M797 Fibromyalgia: Secondary | ICD-10-CM | POA: Diagnosis present

## 2024-07-11 DIAGNOSIS — R531 Weakness: Secondary | ICD-10-CM | POA: Diagnosis present

## 2024-07-11 DIAGNOSIS — B182 Chronic viral hepatitis C: Secondary | ICD-10-CM | POA: Diagnosis present

## 2024-07-11 DIAGNOSIS — Z8419 Family history of other disorders of kidney and ureter: Secondary | ICD-10-CM

## 2024-07-11 DIAGNOSIS — R188 Other ascites: Secondary | ICD-10-CM | POA: Diagnosis not present

## 2024-07-11 DIAGNOSIS — Z820 Family history of epilepsy and other diseases of the nervous system: Secondary | ICD-10-CM

## 2024-07-11 DIAGNOSIS — Z7951 Long term (current) use of inhaled steroids: Secondary | ICD-10-CM

## 2024-07-11 DIAGNOSIS — N1831 Chronic kidney disease, stage 3a: Secondary | ICD-10-CM | POA: Diagnosis present

## 2024-07-11 DIAGNOSIS — I509 Heart failure, unspecified: Secondary | ICD-10-CM | POA: Diagnosis not present

## 2024-07-11 DIAGNOSIS — K7460 Unspecified cirrhosis of liver: Secondary | ICD-10-CM | POA: Diagnosis not present

## 2024-07-11 DIAGNOSIS — Z87442 Personal history of urinary calculi: Secondary | ICD-10-CM

## 2024-07-11 DIAGNOSIS — R262 Difficulty in walking, not elsewhere classified: Secondary | ICD-10-CM | POA: Diagnosis present

## 2024-07-11 DIAGNOSIS — D631 Anemia in chronic kidney disease: Secondary | ICD-10-CM | POA: Diagnosis present

## 2024-07-11 DIAGNOSIS — R1900 Intra-abdominal and pelvic swelling, mass and lump, unspecified site: Secondary | ICD-10-CM | POA: Diagnosis present

## 2024-07-11 DIAGNOSIS — N179 Acute kidney failure, unspecified: Secondary | ICD-10-CM | POA: Diagnosis present

## 2024-07-11 DIAGNOSIS — Z833 Family history of diabetes mellitus: Secondary | ICD-10-CM

## 2024-07-11 DIAGNOSIS — Z9049 Acquired absence of other specified parts of digestive tract: Secondary | ICD-10-CM

## 2024-07-11 DIAGNOSIS — Z8249 Family history of ischemic heart disease and other diseases of the circulatory system: Secondary | ICD-10-CM

## 2024-07-11 DIAGNOSIS — I5033 Acute on chronic diastolic (congestive) heart failure: Secondary | ICD-10-CM | POA: Diagnosis not present

## 2024-07-11 DIAGNOSIS — R601 Generalized edema: Secondary | ICD-10-CM

## 2024-07-11 DIAGNOSIS — I160 Hypertensive urgency: Secondary | ICD-10-CM | POA: Diagnosis present

## 2024-07-11 DIAGNOSIS — Z825 Family history of asthma and other chronic lower respiratory diseases: Secondary | ICD-10-CM

## 2024-07-11 DIAGNOSIS — E8809 Other disorders of plasma-protein metabolism, not elsewhere classified: Secondary | ICD-10-CM | POA: Diagnosis present

## 2024-07-11 DIAGNOSIS — E1122 Type 2 diabetes mellitus with diabetic chronic kidney disease: Secondary | ICD-10-CM | POA: Diagnosis present

## 2024-07-11 DIAGNOSIS — K766 Portal hypertension: Secondary | ICD-10-CM | POA: Diagnosis present

## 2024-07-11 DIAGNOSIS — J4489 Other specified chronic obstructive pulmonary disease: Secondary | ICD-10-CM | POA: Diagnosis present

## 2024-07-11 DIAGNOSIS — E876 Hypokalemia: Secondary | ICD-10-CM | POA: Diagnosis present

## 2024-07-11 DIAGNOSIS — I1A0 Resistant hypertension: Secondary | ICD-10-CM | POA: Diagnosis present

## 2024-07-11 DIAGNOSIS — E119 Type 2 diabetes mellitus without complications: Secondary | ICD-10-CM

## 2024-07-11 DIAGNOSIS — Z79899 Other long term (current) drug therapy: Secondary | ICD-10-CM

## 2024-07-11 LAB — COMPREHENSIVE METABOLIC PANEL WITH GFR
ALT: 52 U/L — ABNORMAL HIGH (ref 0–44)
AST: 90 U/L — ABNORMAL HIGH (ref 15–41)
Albumin: 2.6 g/dL — ABNORMAL LOW (ref 3.5–5.0)
Alkaline Phosphatase: 67 U/L (ref 38–126)
Anion gap: 10 (ref 5–15)
BUN: 16 mg/dL (ref 8–23)
CO2: 22 mmol/L (ref 22–32)
Calcium: 8.5 mg/dL — ABNORMAL LOW (ref 8.9–10.3)
Chloride: 111 mmol/L (ref 98–111)
Creatinine, Ser: 1.22 mg/dL — ABNORMAL HIGH (ref 0.44–1.00)
GFR, Estimated: 49 mL/min — ABNORMAL LOW (ref >60)
Glucose, Bld: 117 mg/dL — ABNORMAL HIGH (ref 70–99)
Potassium: 3.2 mmol/L — ABNORMAL LOW (ref 3.5–5.1)
Sodium: 144 mmol/L (ref 135–145)
Total Bilirubin: 0.4 mg/dL (ref 0.0–1.2)
Total Protein: 4.7 g/dL — ABNORMAL LOW (ref 6.5–8.1)

## 2024-07-11 LAB — URINALYSIS, ROUTINE W REFLEX MICROSCOPIC
Bacteria, UA: NONE SEEN
Bilirubin Urine: NEGATIVE
Glucose, UA: NEGATIVE mg/dL
Ketones, ur: NEGATIVE mg/dL
Leukocytes,Ua: NEGATIVE
Nitrite: NEGATIVE
Protein, ur: 100 mg/dL — AB
Specific Gravity, Urine: 1.005 (ref 1.005–1.030)
pH: 6 (ref 5.0–8.0)

## 2024-07-11 LAB — CBC
HCT: 30.2 % — ABNORMAL LOW (ref 36.0–46.0)
Hemoglobin: 10.7 g/dL — ABNORMAL LOW (ref 12.0–15.0)
MCH: 32.9 pg (ref 26.0–34.0)
MCHC: 35.4 g/dL (ref 30.0–36.0)
MCV: 92.9 fL (ref 80.0–100.0)
Platelets: 151 K/uL (ref 150–400)
RBC: 3.25 MIL/uL — ABNORMAL LOW (ref 3.87–5.11)
RDW: 14.7 % (ref 11.5–15.5)
WBC: 6.3 K/uL (ref 4.0–10.5)
nRBC: 0 % (ref 0.0–0.2)

## 2024-07-11 LAB — PRO BRAIN NATRIURETIC PEPTIDE: Pro Brain Natriuretic Peptide: 4021 pg/mL — ABNORMAL HIGH (ref <300.0)

## 2024-07-11 LAB — TROPONIN T, HIGH SENSITIVITY: Troponin T High Sensitivity: 21 ng/L — ABNORMAL HIGH (ref 0–19)

## 2024-07-11 LAB — LIPASE, BLOOD: Lipase: 36 U/L (ref 11–51)

## 2024-07-11 LAB — MAGNESIUM: Magnesium: 1.6 mg/dL — ABNORMAL LOW (ref 1.7–2.4)

## 2024-07-11 MED ORDER — HYDRALAZINE HCL 20 MG/ML IJ SOLN
10.0000 mg | INTRAMUSCULAR | Status: DC | PRN
Start: 2024-07-11 — End: 2024-07-17
  Administered 2024-07-12 – 2024-07-16 (×5): 10 mg via INTRAVENOUS
  Filled 2024-07-11 (×6): qty 1

## 2024-07-11 MED ORDER — POTASSIUM CHLORIDE CRYS ER 20 MEQ PO TBCR
40.0000 meq | EXTENDED_RELEASE_TABLET | Freq: Once | ORAL | Status: AC
  Administered 2024-07-11: 40 meq via ORAL
  Filled 2024-07-11: qty 2

## 2024-07-11 MED ORDER — MAGNESIUM SULFATE 2 GM/50ML IV SOLN
2.0000 g | Freq: Once | INTRAVENOUS | Status: AC
  Administered 2024-07-11: 2 g via INTRAVENOUS
  Filled 2024-07-11: qty 50

## 2024-07-11 MED ORDER — HYDRALAZINE HCL 10 MG PO TABS
10.0000 mg | ORAL_TABLET | Freq: Once | ORAL | Status: AC
  Administered 2024-07-11: 10 mg via ORAL
  Filled 2024-07-11: qty 1

## 2024-07-11 MED ORDER — FUROSEMIDE 10 MG/ML IJ SOLN
40.0000 mg | Freq: Once | INTRAMUSCULAR | Status: AC
  Administered 2024-07-11: 40 mg via INTRAVENOUS
  Filled 2024-07-11: qty 4

## 2024-07-11 MED ORDER — POTASSIUM CHLORIDE 10 MEQ/100ML IV SOLN
10.0000 meq | Freq: Once | INTRAVENOUS | Status: AC
  Administered 2024-07-11: 10 meq via INTRAVENOUS
  Filled 2024-07-11: qty 100

## 2024-07-11 NOTE — H&P (Incomplete)
 History and Physical    Susan Hogan FMW:[id number] DOB: [birth year] DOA: 07/11/2024  Patient coming from: Home.  Chief Complaint: Shortness of breath.  HPI: Susan Hogan is a 63 y.o. female with history of liver cirrhosis secondary to hepatitis C which patient states she was treated for hepatitis C by gastroenterologist in [place] , chronic HFpEF last EF measured was 54 to 69% on April 09, 2024 admitted in July 2025 for fluid overload presents to the ER with complaints of worsening lower extremity edema and abdominal distention.  Patient states over the last week or two patient has been having progressive worsening of lower EXTR edema and abdominal distention.  Patient also is feeling a difficult to walk on exertion.  Denies chest pain productive cough fever or chills.  Has been having some nausea denies any vomiting or diarrhea.  Patient had followed up with cardiologist in July when patient was supposed to be on ARB and Aldactone in addition to the amlodipine  which patient has not been taking.  Patient states that ARB and Aldactone was causing more edema.  Has been taking Lasix .  ED Course: In the ER Dopplers were negative for DVT.  Chest x-ray was unremarkable.  Labs show hemoglobin 10.7 proBNP 4000 troponin 21.  Potassium 3.2 magnesium  1.6.  Creatinine 1.2 increased from baseline.  Patient was given Lasix  40 mg admitted for anasarca.  Blood pressure systolic was more than 200.  Review of Systems: As per HPI, rest all negative.   Past Medical History:  Diagnosis Date   Anemia    Arthritis    RA states does not see Rheumatologist   Asthma    Carpal tunnel syndrome    bilaterally   Complication of anesthesia    difficulty breathing   COPD (chronic obstructive pulmonary disease) (HCC)    COVID-19    Degenerative disk disease    back   Depression    Diabetes mellitus    Fibromyalgia    GERD (gastroesophageal reflux disease)    Headache(784.0)    migraines    Heart murmur    Hepatitis    In followup treatment for hepatitis C   Hypertension    no medication for at this time, sees Dr. Annetta Pouch Pcp   Hypotensive episode    hx of   Irritable bowel syndrome    Kidney stone    with hematuria   Memory loss    Migraines    Recurrent upper respiratory infection (URI)    Shortness of breath    Syncope     Past Surgical History:  Procedure Laterality Date   CHOLECYSTECTOMY  2019   DILATION AND CURETTAGE OF UTERUS     Left knee surgery     LIVER BIOPSY     TONSILLECTOMY     TUBAL LIGATION       reports that she has never smoked. She has never been exposed to tobacco smoke. She has never used smokeless tobacco. She reports that she does not drink alcohol and does not use drugs.  Allergies  Allergen Reactions   Anesthetic Ether [Ether] Shortness Of Breath    IV anesthesia causes extreme pain and tightness in back and ribcage.   Nubain [Nalbuphine Hcl] Anaphylaxis   Penicillins Hives and Shortness Of Breath    DID THE REACTION INVOLVE: Swelling of the face/tongue/throat, SOB, or low BP? Yes Sudden or severe rash/hives, skin peeling, or the inside of the mouth or nose? Yes Did it require medical treatment? Yes When  did it last happen? About 3 years ago    If all above answers are "NO", may proceed with cephalosporin use.   Lisinopril  Hives   Advair Diskus [Fluticasone -Salmeterol] Hives   Lactose Intolerance (Gi) Diarrhea   Tizanidine  Palpitations and Other (See Comments)    made my right side numb    Family History  Problem Relation Age of Onset   Heart disease Mother    Diabetes Mother    Asthma Mother    Anemia Mother    Cerebral aneurysm Mother        ruptured   Kidney failure Mother    Migraines Mother    Parkinson's disease Mother    Colon cancer Maternal Uncle    Heart disease Maternal Uncle    Heart disease Maternal Grandmother    Diabetes Maternal Grandmother    Asthma Maternal Grandmother    Heart  Problems Maternal Grandmother    Heart Problems Other        through family   Diabetes Granddaughter    Diabetes Daughter     Prior to Admission medications   Medication Sig Start Date End Date Taking? Authorizing Provider  albuterol  (VENTOLIN  HFA) 108 (90 Base) MCG/ACT inhaler Inhale 1-2 puffs into the lungs every 6 (six) hours as needed for wheezing or shortness of breath. 02/10/23   Hope Almarie ORN, NP  amLODipine  (NORVASC ) 10 MG tablet Take 1 tablet (10 mg total) by mouth daily. 01/25/24 02/24/24  Odell Balls, PA-C  Atogepant  (QULIPTA ) 60 MG TABS Take 1 tablet (60 mg total) by mouth daily. 01/28/24   Skeet, Adam R, DO  Azelastine -Fluticasone  (DYMISTA ) 137-50 MCG/ACT SUSP Place 2 sprays into both nostrils 1 day or 1 dose. 05/14/23   Jeneal Danita Macintosh, MD  cetirizine  (ZYRTEC ) 10 MG tablet Take 1 tablet (10 mg total) by mouth daily as needed for allergies. 05/14/23   Jeneal Danita Macintosh, MD  clarithromycin  (BIAXIN ) 500 MG tablet Take 1 tablet (500 mg total) by mouth 2 (two) times daily. 11/16/23   Neda Jennet LABOR, MD  fluticasone  (FLONASE ) 50 MCG/ACT nasal spray Place 1 spray into both nostrils daily as needed for allergies. 12/19/19   [provider]  fluticasone  (FLOVENT  HFA) 110 MCG/ACT inhaler Inhale 2 puffs into the lungs 2 (two) times daily. 05/14/23   Jeneal Danita Macintosh, MD  furosemide  (LASIX ) 20 MG tablet Take 1 tablet (20 mg total) by mouth daily. 01/25/24   Odell Balls, PA-C  hydrochlorothiazide (HYDRODIURIL) 25 MG tablet Take 25 mg by mouth daily. 02/05/24   [provider]  levocetirizine (XYZAL) 5 MG tablet Take 5 mg by mouth every evening. 08/12/23   [provider]  loratadine  (CLARITIN ) 10 MG tablet Take by mouth. 04/19/23   [provider]  methocarbamol  (ROBAXIN ) 500 MG tablet Take 1 tablet (500 mg total) by mouth every 8 (eight) hours as needed for muscle spasms. 06/05/24   Sponseller, Pleasant SAUNDERS, PA-C  olmesartan  (BENICAR )  20 MG tablet Take 1 tablet (20 mg total) by mouth daily. 01/25/24   Odell Balls, PA-C  Olopatadine  HCl (PATADAY ) 0.2 % SOLN Place 1 drop into both eyes 1 day or 1 dose. 05/14/23   Jeneal Danita Macintosh, MD  dicyclomine  (BENTYL ) 20 MG tablet Take 1 tablet (20 mg total) by mouth 2 (two) times daily. Patient not taking: Reported on 10/01/2019 08/19/19 10/01/19  Zada Elouise BROCKS, PA-C  diphenhydrAMINE  (BENADRYL ) 25 MG tablet Take 1 tablet (25 mg total) by mouth every 6 (six) hours as needed.  Patient not taking: Reported on 10/01/2019 09/24/19 10/01/19  Petrucelli, Samantha R, PA-C  escitalopram  (LEXAPRO ) 10 MG tablet TAKE 1 TABLET(10 MG) BY MOUTH DAILY Patient not taking: Reported on 07/05/2018 06/01/18 09/24/19  Prentiss Frieze, DO  labetalol  (NORMODYNE ) 100 MG tablet Take 1 tablet (100 mg total) by mouth 2 (two) times daily. Patient not taking: Reported on 10/01/2019 08/30/19 10/01/19  Zammit, Joseph, MD  topiramate  (TOPAMAX ) 100 MG tablet Take 1 tablet (100 mg total) by mouth at bedtime. Patient not taking: Reported on 10/01/2019 03/09/19 10/01/19  Ines Onetha NOVAK, MD    Physical Exam: Constitutional: Moderately built and nourished. Vitals:   07/11/24 2109 07/11/24 2145 07/11/24 2235 07/11/24 2335  BP: (!) 195/94 (!) 219/95 (!) 198/102 (!) 221/100  Pulse: 75 76 75 80  Resp: 17 19 (!) 23 17  Temp: 98.2 F (36.8 C) 98.1 F (36.7 C) 98 F (36.7 C) 98 F (36.7 C)  TempSrc: Oral Oral  Oral  SpO2: 98% 100% 98% 98%  Weight:      Height:       Eyes: Anicteric no pallor. ENMT: No discharge from the ears eyes nose or mouth. Neck: No mass felt.  No JVD appreciated. Respiratory: No rhonchi or crepitations. Cardiovascular: S1-S2 heard. Abdomen: Soft distended no guarding or rigidity. Musculoskeletal: Bilateral lower extremity edema 3+ up to the groin. Skin: No rash. Neurologic: Alert awake oriented time place and person.  Moves all extremities. Psychiatric: Appears normal.  Normal affect.   Labs on  Admission: I have personally reviewed following labs and imaging studies  CBC: Recent Labs  Lab 07/11/24 1243  WBC 6.3  HGB 10.7*  HCT 30.2*  MCV 92.9  PLT 151   Basic Metabolic Panel: Recent Labs  Lab 07/11/24 1243  NA 144  K 3.2*  CL 111  CO2 22  GLUCOSE 117*  BUN 16  CREATININE 1.22*  CALCIUM  8.5*  MG 1.6*   GFR: Estimated Creatinine Clearance: 47 mL/min (A) (by C-G formula based on SCr of 1.22 mg/dL (H)). Liver Function Tests: Recent Labs  Lab 07/11/24 1243  AST 90*  ALT 52*  ALKPHOS 67  BILITOT 0.4  PROT 4.7*  ALBUMIN 2.6*   Recent Labs  Lab 07/11/24 1243  LIPASE 36   No results for input(s): AMMONIA in the last 168 hours. Coagulation Profile: No results for input(s): INR, PROTIME in the last 168 hours. Cardiac Enzymes: No results for input(s): CKTOTAL, CKMB, CKMBINDEX, TROPONINI in the last 168 hours. BNP (last 3 results) Recent Labs    07/11/24 1243  PROBNP 4,021.0*   HbA1C: No results for input(s): HGBA1C in the last 72 hours. CBG: No results for input(s): GLUCAP in the last 168 hours. Lipid Profile: No results for input(s): CHOL, HDL, LDLCALC, TRIG, CHOLHDL, LDLDIRECT in the last 72 hours. Thyroid  Function Tests: No results for input(s): TSH, T4TOTAL, FREET4, T3FREE, THYROIDAB in the last 72 hours. Anemia Panel: No results for input(s): VITAMINB12, FOLATE, FERRITIN, TIBC, IRON, RETICCTPCT in the last 72 hours. Urine analysis:    Component Value Date/Time   COLORURINE COLORLESS (A) 07/11/2024 1630   APPEARANCEUR CLEAR 07/11/2024 1630   LABSPEC 1.005 07/11/2024 1630   PHURINE 6.0 07/11/2024 1630   GLUCOSEU NEGATIVE 07/11/2024 1630   HGBUR MODERATE (A) 07/11/2024 1630   BILIRUBINUR NEGATIVE 07/11/2024 1630   KETONESUR NEGATIVE 07/11/2024 1630   PROTEINUR 100 (A) 07/11/2024 1630   UROBILINOGEN 0.2 05/26/2013 0048   NITRITE NEGATIVE 07/11/2024 1630   LEUKOCYTESUR NEGATIVE 07/11/2024  1630  Sepsis Labs: @LABRCNTIP (procalcitonin:4,lacticidven:4) )No results found for this or any previous visit (from the past 240 hours).   Radiological Exams on Admission: US  Venous Img Lower Bilateral Result Date: 07/11/2024 CLINICAL DATA:  worsening LE edema L>R EXAM: BILATERAL LOWER EXTREMITY VENOUS DOPPLER ULTRASOUND TECHNIQUE: Gray-scale sonography with graded compression, as well as color Doppler and duplex ultrasound were performed to evaluate the lower extremity deep venous systems from the level of the common femoral vein and including the common femoral, femoral, profunda femoral, popliteal and calf veins including the posterior tibial, peroneal and gastrocnemius veins when visible. The superficial great saphenous vein was also interrogated. Spectral Doppler was utilized to evaluate flow at rest and with distal augmentation maneuvers in the common femoral, femoral and popliteal veins. COMPARISON:  None Available. FINDINGS: RIGHT LOWER EXTREMITY Common Femoral Vein: No evidence of thrombus. Normal compressibility, respiratory phasicity and response to augmentation. Saphenofemoral Junction: No evidence of thrombus. Normal compressibility and flow on color Doppler imaging. Profunda Femoral Vein: No evidence of thrombus. Normal compressibility and flow on color Doppler imaging. Femoral Vein: No evidence of thrombus. Normal compressibility, respiratory phasicity and response to augmentation. Popliteal Vein: No evidence of thrombus. Normal compressibility, respiratory phasicity and response to augmentation. Calf Veins: No evidence of thrombus. Normal compressibility and flow on color Doppler imaging. Superficial Great Saphenous Vein: No evidence of thrombus. Normal compressibility. Other Findings:  None. LEFT LOWER EXTREMITY Common Femoral Vein: No evidence of thrombus. Normal compressibility, respiratory phasicity and response to augmentation. Saphenofemoral Junction: No evidence of thrombus. Normal  compressibility and flow on color Doppler imaging. Profunda Femoral Vein: No evidence of thrombus. Normal compressibility and flow on color Doppler imaging. Femoral Vein: No evidence of thrombus. Normal compressibility, respiratory phasicity and response to augmentation. Popliteal Vein: No evidence of thrombus. Normal compressibility, respiratory phasicity and response to augmentation. Calf Veins: No evidence of thrombus. Normal compressibility and flow on color Doppler imaging. Superficial Great Saphenous Vein: No evidence of thrombus. Normal compressibility. Other Findings:  None. IMPRESSION: Negative for deep venous thrombosis within both legs. Electronically Signed   By: Rogelia Myers M.D.   On: 07/11/2024 16:36   DG Chest Portable 1 View Result Date: 07/11/2024 EXAM: 1 VIEW(S) XRAY OF THE CHEST 07/11/2024 03:22:00 PM COMPARISON: 01/25/2024 CLINICAL HISTORY: shortness of breath on exertion, history of CHF FINDINGS: LINES, TUBES AND DEVICES: Cardiac monitor over left chest. LUNGS AND PLEURA: Low lung volumes. No focal pulmonary opacity. No pulmonary edema. No pleural effusion. No pneumothorax. HEART AND MEDIASTINUM: No acute abnormality of the cardiac and mediastinal silhouettes. BONES AND SOFT TISSUES: No acute osseous abnormality. Paucity of bowel gas in visible abdomen. IMPRESSION: 1. No acute cardiopulmonary process identified. 2. Low lung volumes Electronically signed by: Waddell Calk MD 07/11/2024 03:57 PM EDT RP Workstation: [id number]    EKG: Independently reviewed.  Normal sinus rhythm.  Assessment/Plan Principal Problem:   CHF exacerbation (HCC)    Anasarca likely could be from decompensated liver cirrhosis versus acute on chronic HFpEF.  Last EF measured was 67 to 69% on April 09, 2024.  Patient received 40 mg IV Lasix .  I have placed patient on 60 mg IV Q12 for 4 doses.  Patient likely will need paracentesis.  Will also order albumin.  Will keep patient on empiric antibiotics until we  get paracentesis fluid.  Closely follow intake output metabolic panel.  Troponin was elevated but flat. Hypertensive urgency will continue home dose of amlodipine .  On IV Lasix .  Will add hydralazine .  If creatinine improves likely  discontinue hydralazine  and start patient on ARB and spironolactone.  Follow blood pressure trends. Acute renal failure when compared to recent labs in July.  Closely monitor creatinine.  If further worsening may need nephrology input.  Check urine sodium.  CT abdomen pelvis is pending. Liver cirrhosis secondary hepatitis C.  Check PT/INR to calculate MELD score.  Patient states she was treated at GI office in [facility] . Diabetes mellitus type 2 not on medication at this time.  Sliding scale coverage check hemoglobin A1c. Anemia follow CBC. History of asthma presently not wheezing. History of migraine headaches. Elevated LFTs.  LFTs are comparable to the one in August 2025.  Follow-up.  Since patient has anasarca will need further diuresis close monitoring of kidney function and more than 2 midnight stay.   DVT prophylaxis: SCDs.  May place patient on Lovenox  or heparin after paracentesis. Code Status: Full code. Family Communication: Discussed with patient. Disposition Plan: Monitored bed. Consults called: Will consult GI. Admission status: Inpatient.

## 2024-07-11 NOTE — ED Provider Notes (Signed)
 [place] EMERGENCY DEPARTMENT AT [facility] Provider Note   CSN: [id number] Arrival date & time: 07/11/24  1223     Patient presents with: Abdominal Pain   Susan Hogan is a 63 y.o. female.   63 year old female presenting with multiple complaints.  Patient reports that her symptoms have been ongoing for 1 year, but notes worsening lower extremity/abdominal edema over the past week, to the point where she is having pain with walking due to the significance of her swelling.  She has a history of CHF for which she is followed by cardiology as well as a history of hepatitis C, she believes she was also told in the past that she may have had cirrhosis but she cannot remember.  She also has hypertension, she did take amlodipine  and furosemide  this morning but is unsure if she took her other blood pressure medications, which include olmesartan , HCTZ, spironolactone, and hydralazine .  She endorses abdominal pain in the lower quadrants that she feels is worse due to her level of abdominal swelling, reports intermittent diarrhea/constipation which has been ongoing for a long time.  She also noted blood in her stool 3 days ago, describes this as bright red streaks.  She also had 1 episode of severe chest pain last week, she thought this may be attributed to asthma as she also noted some wheezing.   Abdominal Pain      Prior to Admission medications   Medication Sig Start Date End Date Taking? Authorizing Provider  albuterol  (VENTOLIN  HFA) 108 (90 Base) MCG/ACT inhaler Inhale 1-2 puffs into the lungs every 6 (six) hours as needed for wheezing or shortness of breath. 02/10/23   Hope Almarie ORN, NP  amLODipine  (NORVASC ) 10 MG tablet Take 1 tablet (10 mg total) by mouth daily. 01/25/24 02/24/24  Odell Balls, PA-C  Atogepant  (QULIPTA ) 60 MG TABS Take 1 tablet (60 mg total) by mouth daily. 01/28/24   Skeet Juliene SAUNDERS, DO  Azelastine -Fluticasone  (DYMISTA ) 137-50 MCG/ACT SUSP Place 2 sprays  into both nostrils 1 day or 1 dose. 05/14/23   Jeneal Danita Macintosh, MD  cetirizine  (ZYRTEC ) 10 MG tablet Take 1 tablet (10 mg total) by mouth daily as needed for allergies. 05/14/23   Jeneal Danita Macintosh, MD  clarithromycin  (BIAXIN ) 500 MG tablet Take 1 tablet (500 mg total) by mouth 2 (two) times daily. 11/16/23   Neda Jennet LABOR, MD  fluticasone  (FLONASE ) 50 MCG/ACT nasal spray Place 1 spray into both nostrils daily as needed for allergies. 12/19/19   [provider]  fluticasone  (FLOVENT  HFA) 110 MCG/ACT inhaler Inhale 2 puffs into the lungs 2 (two) times daily. 05/14/23   Jeneal Danita Macintosh, MD  furosemide  (LASIX ) 20 MG tablet Take 1 tablet (20 mg total) by mouth daily. 01/25/24   Odell Balls, PA-C  hydrochlorothiazide (HYDRODIURIL) 25 MG tablet Take 25 mg by mouth daily. 02/05/24   [provider]  levocetirizine (XYZAL) 5 MG tablet Take 5 mg by mouth every evening. 08/12/23   [provider]  loratadine  (CLARITIN ) 10 MG tablet Take by mouth. 04/19/23   [provider]  methocarbamol  (ROBAXIN ) 500 MG tablet Take 1 tablet (500 mg total) by mouth every 8 (eight) hours as needed for muscle spasms. 06/05/24   Sponseller, Pleasant R, PA-C  olmesartan  (BENICAR ) 20 MG tablet Take 1 tablet (20 mg total) by mouth daily. 01/25/24   Odell Balls, PA-C  Olopatadine  HCl (PATADAY ) 0.2 % SOLN Place 1 drop into both eyes 1 day or 1 dose. 05/14/23  Jeneal Danita Macintosh, MD  dicyclomine  (BENTYL ) 20 MG tablet Take 1 tablet (20 mg total) by mouth 2 (two) times daily. Patient not taking: Reported on 10/01/2019 08/19/19 10/01/19  Joy, Elouise BROCKS, PA-C  diphenhydrAMINE  (BENADRYL ) 25 MG tablet Take 1 tablet (25 mg total) by mouth every 6 (six) hours as needed. Patient not taking: Reported on 10/01/2019 09/24/19 10/01/19  Petrucelli, Samantha R, PA-C  escitalopram  (LEXAPRO ) 10 MG tablet TAKE 1 TABLET(10 MG) BY MOUTH DAILY Patient not taking: Reported on 07/05/2018 06/01/18  09/24/19  Prentiss Frieze, DO  labetalol  (NORMODYNE ) 100 MG tablet Take 1 tablet (100 mg total) by mouth 2 (two) times daily. Patient not taking: Reported on 10/01/2019 08/30/19 10/01/19  Zammit, Joseph, MD  topiramate  (TOPAMAX ) 100 MG tablet Take 1 tablet (100 mg total) by mouth at bedtime. Patient not taking: Reported on 10/01/2019 03/09/19 10/01/19  Ines Onetha NOVAK, MD    Allergies: Anesthetic ether [ether], Nubain [nalbuphine hcl], Penicillins, Lisinopril , Advair diskus [fluticasone -salmeterol], Lactose intolerance (gi), and Tizanidine     Review of Systems  Gastrointestinal:  Positive for abdominal pain.    Updated Vital Signs  Vitals:   07/11/24 1240 07/11/24 1430 07/11/24 1630  BP: (!) 218/106 (!) 209/83 (!) 186/112  Pulse: 75 67 71  Resp: 18 18 16   Temp: 98.1 F (36.7 C)  98.7 F (37.1 C)  TempSrc:   Oral  SpO2: 98% 100% 100%     Physical Exam Vitals and nursing note reviewed.  HENT:     Head: Normocephalic.  Eyes:     Extraocular Movements: Extraocular movements intact.  Cardiovascular:     Rate and Rhythm: Normal rate and regular rhythm.     Heart sounds: Murmur heard.  Pulmonary:     Effort: Pulmonary effort is normal.     Comments: Crackles noted at the lung bases bilaterally Abdominal:     General: There is distension.     Palpations: Abdomen is soft.     Tenderness: There is abdominal tenderness. There is no guarding.     Comments: Mild tenderness in the lower quadrants L>R Induration noted to across lower abdomen, no associated erythema/warmth  Musculoskeletal:     Cervical back: Normal range of motion.     Comments: 2+ LE pitting edema bilaterally 2+ DP pulses  Skin:    General: Skin is warm and dry.  Neurological:     Mental Status: She is alert and oriented to person, place, and time.     (all labs ordered are listed, but only abnormal results are displayed) Labs Reviewed  COMPREHENSIVE METABOLIC PANEL WITH GFR - Abnormal; Notable for the following  components:      Result Value   Potassium 3.2 (*)    Glucose, Bld 117 (*)    Creatinine, Ser 1.22 (*)    Calcium  8.5 (*)    Total Protein 4.7 (*)    Albumin 2.6 (*)    AST 90 (*)    ALT 52 (*)    GFR, Estimated 49 (*)    All other components within normal limits  CBC - Abnormal; Notable for the following components:   RBC 3.25 (*)    Hemoglobin 10.7 (*)    HCT 30.2 (*)    All other components within normal limits  LIPASE, BLOOD  URINALYSIS, ROUTINE W REFLEX MICROSCOPIC    EKG: EKG Interpretation Date/Time:  Tuesday July 11 2024 12:44:26 EDT Ventricular Rate:  73 PR Interval:    QRS Duration:  86 QT Interval:  398  QTC Calculation: 438 R Axis:   187  Text Interpretation: Normal sinus rhythm Nonspecific ST abnormality When compared with ECG of 25-Jan-2024 16:06, PREVIOUS ECG IS PRESENT Confirmed by Ula Barter [phone]) on 07/11/2024 12:46:21 PM  Radiology: US  Venous Img Lower Bilateral Result Date: 07/11/2024 CLINICAL DATA:  worsening LE edema L>R EXAM: BILATERAL LOWER EXTREMITY VENOUS DOPPLER ULTRASOUND TECHNIQUE: Gray-scale sonography with graded compression, as well as color Doppler and duplex ultrasound were performed to evaluate the lower extremity deep venous systems from the level of the common femoral vein and including the common femoral, femoral, profunda femoral, popliteal and calf veins including the posterior tibial, peroneal and gastrocnemius veins when visible. The superficial great saphenous vein was also interrogated. Spectral Doppler was utilized to evaluate flow at rest and with distal augmentation maneuvers in the common femoral, femoral and popliteal veins. COMPARISON:  None Available. FINDINGS: RIGHT LOWER EXTREMITY Common Femoral Vein: No evidence of thrombus. Normal compressibility, respiratory phasicity and response to augmentation. Saphenofemoral Junction: No evidence of thrombus. Normal compressibility and flow on color Doppler imaging. Profunda Femoral  Vein: No evidence of thrombus. Normal compressibility and flow on color Doppler imaging. Femoral Vein: No evidence of thrombus. Normal compressibility, respiratory phasicity and response to augmentation. Popliteal Vein: No evidence of thrombus. Normal compressibility, respiratory phasicity and response to augmentation. Calf Veins: No evidence of thrombus. Normal compressibility and flow on color Doppler imaging. Superficial Great Saphenous Vein: No evidence of thrombus. Normal compressibility. Other Findings:  None. LEFT LOWER EXTREMITY Common Femoral Vein: No evidence of thrombus. Normal compressibility, respiratory phasicity and response to augmentation. Saphenofemoral Junction: No evidence of thrombus. Normal compressibility and flow on color Doppler imaging. Profunda Femoral Vein: No evidence of thrombus. Normal compressibility and flow on color Doppler imaging. Femoral Vein: No evidence of thrombus. Normal compressibility, respiratory phasicity and response to augmentation. Popliteal Vein: No evidence of thrombus. Normal compressibility, respiratory phasicity and response to augmentation. Calf Veins: No evidence of thrombus. Normal compressibility and flow on color Doppler imaging. Superficial Great Saphenous Vein: No evidence of thrombus. Normal compressibility. Other Findings:  None. IMPRESSION: Negative for deep venous thrombosis within both legs. Electronically Signed   By: Rogelia Myers M.D.   On: 07/11/2024 16:36   DG Chest Portable 1 View Result Date: 07/11/2024 EXAM: 1 VIEW(S) XRAY OF THE CHEST 07/11/2024 03:22:00 PM COMPARISON: 01/25/2024 CLINICAL HISTORY: shortness of breath on exertion, history of CHF FINDINGS: LINES, TUBES AND DEVICES: Cardiac monitor over left chest. LUNGS AND PLEURA: Low lung volumes. No focal pulmonary opacity. No pulmonary edema. No pleural effusion. No pneumothorax. HEART AND MEDIASTINUM: No acute abnormality of the cardiac and mediastinal silhouettes. BONES AND SOFT  TISSUES: No acute osseous abnormality. Paucity of bowel gas in visible abdomen. IMPRESSION: 1. No acute cardiopulmonary process identified. 2. Low lung volumes Electronically signed by: Waddell Calk MD 07/11/2024 03:57 PM EDT RP Workstation: [id number]     Procedures   Medications Ordered in the ED  magnesium  sulfate IVPB 2 g 50 mL (has no administration in time range)  furosemide  (LASIX ) injection 40 mg (40 mg Intravenous Given 07/11/24 1556)  potassium chloride  10 mEq in 100 mL IVPB (10 mEq Intravenous New Bag/Given 07/11/24 1609)  potassium chloride  SA (KLOR-CON  M) CR tablet 40 mEq (40 mEq Oral Given 07/11/24 1556)  hydrALAZINE  (APRESOLINE ) tablet 10 mg (10 mg Oral Given 07/11/24 1757)  Medical Decision Making This patient presents to the ED for concern of Lower extremity edema/abdominal pain/hypertension , this involves an extensive number of treatment options, and is a complaint that carries with it a high risk of complications and morbidity.  The differential diagnosis includes poorly controlled hypertension, hypertensive emergency, CHF exacerbation, CKD, electrolyte disturbance   Co morbidities that complicate the patient evaluation  Poorly controlled HTN, Hep C, CHF ?   Additional history obtained:  Additional history obtained from record review External records from outside source obtained and reviewed including [facility] cardiology notes   Lab Tests:  I Ordered, and personally interpreted labs.  The pertinent results include: CBC notable for hemoglobin of 10.7, this has trended down over the past 5 months with most recent value being 11.3.  CMP notable for mild hypokalemia with potassium of 3.2, creatinine is elevated at 1.22 with most recent baseline being 1.03 from 5 months ago, AST/ALT elevated which is consistent from most recent baseline.  Lipase within normal limits.  BNP is elevated at 4021.  Initial troponin elevated at 21, likely  demand ischemia but will obtain second troponin.  Magnesium  is mildly reduced at 1.6.   Imaging Studies ordered:  I ordered imaging studies including CXR, DVT US  LE's  I independently visualized and interpreted imaging which showed  - CXR: 1. No acute cardiopulmonary process identified. 2. Low lung volumes - US : Negative for deep venous thrombosis within both legs. I agree with the radiologist interpretation   Cardiac Monitoring: / EKG:  The patient was maintained on a cardiac monitor.  I personally viewed and interpreted the cardiac monitored which showed an underlying rhythm of: NSR   Consultations Obtained:  I requested consultation with the hospitalist,  and discussed lab and imaging findings as well as pertinent plan - they recommend: I spoke with Dr. Darci who agrees that this patient is appropriate for further diuresis as well as management of her ongoing HTN in the setting of acute onset CHF   Problem List / ED Course / Critical interventions / Medication management  I ordered medication including IV Lasix  for diuresis, IV/p.o. potassium for hypokalemia, IV magnesium  for hypomagnesemia, hydralazine  for hypertension Reevaluation of the patient after these medicines showed that the patient improved I have reviewed the patients home medicines and have made adjustments as needed   Social Determinants of Health:  Financial instability, poor health literacy   Test / Admission - Considered:  Physical exam is notable as above, patient does have significant bilateral lower extremity pitting edema as well as abdominal distention with mild tenderness on exam, given this as well as crackles noted in the lung bases on physical exam I suspect that these are signs of fluid volume overload, likely secondary to CHF.  Patient reports that she does have a history of CHF, however upon review of her cardiology note from [facility] it appears that her echocardiogram in July showed a normal LVEF  of 67-69%, it is unclear if she truly has been diagnosed with CHF in the past or if this is an acute onset of this condition secondary to poorly controlled hypertension.   Patient is significantly hypertensive at time of presentation to the emergency department today, she is prescribed multiple medications for control of her hypertension, including olmesartan /amlodipine /hydralazine /furosemide /hydrochlorothiazide/spironolactone, patient mentions that she did take amlodipine  as well as 1 other of these medications this morning but it is unclear if she takes any of the other medications, she admits that she is confused and often mixes up  her medications/does not know what she is prescribed them for. She does have some mild abdominal pain/distention, however I suspect that this is secondary to her fluid overload status, I discussed with her that I do not feel that CT imaging of her abdomen is warranted at this time, she voiced understanding. Labs are notable for significantly elevated BNP, this is consistent with CHF exacerbation.  Creatinine is mildly elevated from baseline but not truly reflective of an AKI, she also has borderline hypokalemia/hypomagnesemia, will replete.  IV diuresis initiated, will admit to the hospitalist service for continued IV diuresis as well as continued management of patient's hypertension.  Staffed with Dr. Simon  Amount and/or Complexity of Data Reviewed Labs: ordered. Radiology: ordered.  Risk Prescription drug management. Decision regarding hospitalization.       Final diagnoses:  Acute on chronic congestive heart failure, unspecified heart failure type [facility])  Resistant hypertension    ED Discharge Orders     None          Marrisa Kimber N, PA-C 07/11/24 1903    Simon Lavonia SAILOR, MD 07/11/24 [phone]

## 2024-07-11 NOTE — Progress Notes (Signed)
   07/11/24 2145  Assess: MEWS Score  Temp 98.1 F (36.7 C)  BP (!) 219/95  MAP (mmHg) 136  Pulse Rate 76  ECG Heart Rate 80  Resp 19  Level of Consciousness Alert  SpO2 100 %  O2 Device Room Air  Assess: MEWS Score  MEWS Temp 0  MEWS Systolic 2  MEWS Pulse 0  MEWS RR 0  MEWS LOC 0  MEWS Score 2  MEWS Score Color Yellow  Assess: if the MEWS score is Yellow or Red  Were vital signs accurate and taken at a resting state? Yes  Does the patient meet 2 or more of the SIRS criteria? No  MEWS guidelines implemented  Yes, yellow  Treat  MEWS Interventions Considered administering scheduled or prn medications/treatments as ordered  Take Vital Signs  Increase Vital Sign Frequency  Yellow: Q2hr x1, continue Q4hrs until patient remains green for 12hrs  Escalate  MEWS: Escalate Yellow: Discuss with charge nurse and consider notifying provider and/or RRT  Notify: Charge Nurse/RN  Name of Charge Nurse/RN Notified Mindy, RN  Provider Notification  Provider Name/Title Dr. Franky  Date Provider Notified 07/11/24  Time Provider Notified 2149  Method of Notification  (secure chat)  Notification Reason Change in status (yellow MEWS due to High BP)  Provider response See new orders  Date of Provider Response 07/11/24  Time of Provider Response 2153  Assess: SIRS CRITERIA  SIRS Temperature  0  SIRS Respirations  0  SIRS Pulse 0  SIRS WBC 0  SIRS Score Sum  0

## 2024-07-11 NOTE — ED Notes (Signed)
 Called [facility] to transport the patient to [facility] rm#2

## 2024-07-11 NOTE — ED Triage Notes (Signed)
 Has multiple complaints of chronic issues that have not resolved.  States she has had abd pain, bilateral leg swelling, and weight loss x 1 year. Hx of CHF. Takes lasix  but reports no improvement.

## 2024-07-11 NOTE — Progress Notes (Signed)
 63 year old female with DM type 2, essential hypertension, hep C liver cirrhosis, CKD stage 3, and morbid obesity presented to [facility] with multiple complaints including lower extremity edema, abdominal pain, weakness, weight loss.  In ED, her BP very high, BNP >4600.  Last echo 03/2024 from care everywhere EF 65%, elevated RVSP. She got IV Lasix , electrolyte replacement in ED. She will need admission to cardiac tele unit for diastolic CHF exacerbation, HTN urgency.

## 2024-07-12 ENCOUNTER — Encounter (HOSPITAL_COMMUNITY): Payer: Self-pay | Admitting: Internal Medicine

## 2024-07-12 ENCOUNTER — Inpatient Hospital Stay (HOSPITAL_COMMUNITY)

## 2024-07-12 DIAGNOSIS — I160 Hypertensive urgency: Secondary | ICD-10-CM | POA: Insufficient documentation

## 2024-07-12 DIAGNOSIS — B182 Chronic viral hepatitis C: Secondary | ICD-10-CM

## 2024-07-12 DIAGNOSIS — E785 Hyperlipidemia, unspecified: Secondary | ICD-10-CM | POA: Diagnosis present

## 2024-07-12 DIAGNOSIS — D631 Anemia in chronic kidney disease: Secondary | ICD-10-CM | POA: Diagnosis present

## 2024-07-12 DIAGNOSIS — R600 Localized edema: Secondary | ICD-10-CM

## 2024-07-12 DIAGNOSIS — M069 Rheumatoid arthritis, unspecified: Secondary | ICD-10-CM | POA: Diagnosis present

## 2024-07-12 DIAGNOSIS — R601 Generalized edema: Secondary | ICD-10-CM

## 2024-07-12 DIAGNOSIS — K7460 Unspecified cirrhosis of liver: Secondary | ICD-10-CM | POA: Insufficient documentation

## 2024-07-12 DIAGNOSIS — R188 Other ascites: Secondary | ICD-10-CM | POA: Diagnosis not present

## 2024-07-12 DIAGNOSIS — K766 Portal hypertension: Secondary | ICD-10-CM | POA: Diagnosis present

## 2024-07-12 DIAGNOSIS — I130 Hypertensive heart and chronic kidney disease with heart failure and stage 1 through stage 4 chronic kidney disease, or unspecified chronic kidney disease: Secondary | ICD-10-CM | POA: Diagnosis present

## 2024-07-12 DIAGNOSIS — J4489 Other specified chronic obstructive pulmonary disease: Secondary | ICD-10-CM | POA: Diagnosis present

## 2024-07-12 DIAGNOSIS — Z833 Family history of diabetes mellitus: Secondary | ICD-10-CM | POA: Diagnosis not present

## 2024-07-12 DIAGNOSIS — Z8249 Family history of ischemic heart disease and other diseases of the circulatory system: Secondary | ICD-10-CM | POA: Diagnosis not present

## 2024-07-12 DIAGNOSIS — N1831 Chronic kidney disease, stage 3a: Secondary | ICD-10-CM | POA: Diagnosis present

## 2024-07-12 DIAGNOSIS — I509 Heart failure, unspecified: Secondary | ICD-10-CM | POA: Diagnosis present

## 2024-07-12 DIAGNOSIS — K529 Noninfective gastroenteritis and colitis, unspecified: Secondary | ICD-10-CM | POA: Diagnosis present

## 2024-07-12 DIAGNOSIS — E8809 Other disorders of plasma-protein metabolism, not elsewhere classified: Secondary | ICD-10-CM | POA: Diagnosis present

## 2024-07-12 DIAGNOSIS — E1122 Type 2 diabetes mellitus with diabetic chronic kidney disease: Secondary | ICD-10-CM | POA: Diagnosis present

## 2024-07-12 DIAGNOSIS — N179 Acute kidney failure, unspecified: Secondary | ICD-10-CM | POA: Diagnosis present

## 2024-07-12 DIAGNOSIS — E876 Hypokalemia: Secondary | ICD-10-CM | POA: Diagnosis present

## 2024-07-12 DIAGNOSIS — Z79899 Other long term (current) drug therapy: Secondary | ICD-10-CM | POA: Diagnosis not present

## 2024-07-12 DIAGNOSIS — M797 Fibromyalgia: Secondary | ICD-10-CM | POA: Diagnosis present

## 2024-07-12 DIAGNOSIS — I5033 Acute on chronic diastolic (congestive) heart failure: Secondary | ICD-10-CM | POA: Diagnosis present

## 2024-07-12 DIAGNOSIS — K5730 Diverticulosis of large intestine without perforation or abscess without bleeding: Secondary | ICD-10-CM | POA: Diagnosis present

## 2024-07-12 DIAGNOSIS — I1A0 Resistant hypertension: Secondary | ICD-10-CM | POA: Diagnosis present

## 2024-07-12 DIAGNOSIS — Z7951 Long term (current) use of inhaled steroids: Secondary | ICD-10-CM | POA: Diagnosis not present

## 2024-07-12 HISTORY — PX: IR PARACENTESIS: IMG2679

## 2024-07-12 LAB — GRAM STAIN: Gram Stain: NONE SEEN

## 2024-07-12 LAB — CBC WITH DIFFERENTIAL/PLATELET
Abs Immature Granulocytes: 0.04 K/uL (ref 0.00–0.07)
Basophils Absolute: 0.1 K/uL (ref 0.0–0.1)
Basophils Relative: 1 %
Eosinophils Absolute: 0.2 K/uL (ref 0.0–0.5)
Eosinophils Relative: 2 %
HCT: 35.9 % — ABNORMAL LOW (ref 36.0–46.0)
Hemoglobin: 12.4 g/dL (ref 12.0–15.0)
Immature Granulocytes: 1 %
Lymphocytes Relative: 22 %
Lymphs Abs: 1.7 K/uL (ref 0.7–4.0)
MCH: 32.5 pg (ref 26.0–34.0)
MCHC: 34.5 g/dL (ref 30.0–36.0)
MCV: 94 fL (ref 80.0–100.0)
Monocytes Absolute: 0.6 K/uL (ref 0.1–1.0)
Monocytes Relative: 8 %
Neutro Abs: 5 K/uL (ref 1.7–7.7)
Neutrophils Relative %: 66 %
Platelets: 217 K/uL (ref 150–400)
RBC: 3.82 MIL/uL — ABNORMAL LOW (ref 3.87–5.11)
RDW: 14.9 % (ref 11.5–15.5)
WBC: 7.6 K/uL (ref 4.0–10.5)
nRBC: 0 % (ref 0.0–0.2)

## 2024-07-12 LAB — BODY FLUID CELL COUNT WITH DIFFERENTIAL
Eos, Fluid: 0 %
Lymphs, Fluid: 86 %
Monocyte-Macrophage-Serous Fluid: 12 % — ABNORMAL LOW (ref 50–90)
Neutrophil Count, Fluid: 2 % (ref 0–25)
Total Nucleated Cell Count, Fluid: 122 uL (ref 0–1000)

## 2024-07-12 LAB — SODIUM, URINE, RANDOM: Sodium, Ur: 120 mmol/L

## 2024-07-12 LAB — BASIC METABOLIC PANEL WITH GFR
Anion gap: 9 (ref 5–15)
BUN: 15 mg/dL (ref 8–23)
CO2: 20 mmol/L — ABNORMAL LOW (ref 22–32)
Calcium: 8.3 mg/dL — ABNORMAL LOW (ref 8.9–10.3)
Chloride: 114 mmol/L — ABNORMAL HIGH (ref 98–111)
Creatinine, Ser: 1.38 mg/dL — ABNORMAL HIGH (ref 0.44–1.00)
GFR, Estimated: 42 mL/min — ABNORMAL LOW (ref >60)
Glucose, Bld: 115 mg/dL — ABNORMAL HIGH (ref 70–99)
Potassium: 3.5 mmol/L (ref 3.5–5.1)
Sodium: 143 mmol/L (ref 135–145)

## 2024-07-12 LAB — IRON AND TIBC
Iron: 49 ug/dL (ref 28–170)
Saturation Ratios: 13 % (ref 10.4–31.8)
TIBC: 379 ug/dL (ref 250–450)
UIBC: 330 ug/dL

## 2024-07-12 LAB — PROTIME-INR
INR: 1.1 (ref 0.8–1.2)
Prothrombin Time: 15.3 s — ABNORMAL HIGH (ref 11.4–15.2)

## 2024-07-12 LAB — PROTEIN, PLEURAL OR PERITONEAL FLUID: Total protein, fluid: <3 g/dL

## 2024-07-12 LAB — TSH: TSH: 7.952 u[IU]/mL — ABNORMAL HIGH (ref 0.350–4.500)

## 2024-07-12 LAB — RETICULOCYTES
Immature Retic Fract: 11.4 % (ref 2.3–15.9)
RBC.: 3.79 MIL/uL — ABNORMAL LOW (ref 3.87–5.11)
Retic Count, Absolute: 114.1 K/uL (ref 19.0–186.0)
Retic Ct Pct: 3 % (ref 0.4–3.1)

## 2024-07-12 LAB — GLUCOSE, CAPILLARY
Glucose-Capillary: 118 mg/dL — ABNORMAL HIGH (ref 70–99)
Glucose-Capillary: 98 mg/dL (ref 70–99)
Glucose-Capillary: 103 mg/dL — ABNORMAL HIGH (ref 70–99)
Glucose-Capillary: 112 mg/dL — ABNORMAL HIGH (ref 70–99)

## 2024-07-12 LAB — TROPONIN I (HIGH SENSITIVITY)
Troponin I (High Sensitivity): 21 ng/L — ABNORMAL HIGH (ref <18)
Troponin I (High Sensitivity): 26 ng/L — ABNORMAL HIGH

## 2024-07-12 LAB — FERRITIN: Ferritin: 94 ng/mL (ref 11–307)

## 2024-07-12 LAB — VITAMIN B12: Vitamin B-12: 356 pg/mL (ref 180–914)

## 2024-07-12 LAB — ALBUMIN, PLEURAL OR PERITONEAL FLUID: Albumin, Fluid: <1.5 g/dL

## 2024-07-12 LAB — HEMOGLOBIN A1C
Hgb A1c MFr Bld: 3.9 % — ABNORMAL LOW (ref 4.8–5.6)
Mean Plasma Glucose: 65.23 mg/dL

## 2024-07-12 LAB — MAGNESIUM: Magnesium: 1.9 mg/dL (ref 1.7–2.4)

## 2024-07-12 LAB — HIV ANTIBODY (ROUTINE TESTING W REFLEX): HIV Screen 4th Generation wRfx: NONREACTIVE

## 2024-07-12 LAB — FOLATE: Folate: 12.2 ng/mL (ref >5.9)

## 2024-07-12 MED ORDER — POTASSIUM CHLORIDE 20 MEQ PO PACK
20.0000 meq | PACK | Freq: Once | ORAL | Status: AC
  Administered 2024-07-12: 20 meq via ORAL
  Filled 2024-07-12: qty 1

## 2024-07-12 MED ORDER — SODIUM CHLORIDE 0.9 % IV SOLN
2.0000 g | Freq: Every day | INTRAVENOUS | Status: DC
  Administered 2024-07-12 – 2024-07-17 (×6): 2 g via INTRAVENOUS
  Filled 2024-07-12 (×7): qty 20

## 2024-07-12 MED ORDER — LIDOCAINE-EPINEPHRINE 1 %-1:100000 IJ SOLN
INTRAMUSCULAR | Status: AC
  Filled 2024-07-12: qty 1

## 2024-07-12 MED ORDER — LIDOCAINE-EPINEPHRINE 1 %-1:100000 IJ SOLN
20.0000 mL | Freq: Once | INTRAMUSCULAR | Status: AC
  Administered 2024-07-12: 10 mL

## 2024-07-12 MED ORDER — ONDANSETRON HCL 4 MG/2ML IJ SOLN
4.0000 mg | Freq: Four times a day (QID) | INTRAMUSCULAR | Status: DC | PRN
  Administered 2024-07-12 – 2024-07-16 (×3): 4 mg via INTRAVENOUS
  Filled 2024-07-12 (×3): qty 2

## 2024-07-12 MED ORDER — AMLODIPINE BESYLATE 10 MG PO TABS
10.0000 mg | ORAL_TABLET | Freq: Every day | ORAL | Status: DC
Start: 2024-07-12 — End: 2024-07-12
  Administered 2024-07-12: 10 mg via ORAL
  Filled 2024-07-12: qty 1

## 2024-07-12 MED ORDER — ACETAMINOPHEN 325 MG PO TABS
325.0000 mg | ORAL_TABLET | Freq: Once | ORAL | Status: AC
  Administered 2024-07-12: 325 mg via ORAL
  Filled 2024-07-12: qty 1

## 2024-07-12 MED ORDER — MORPHINE SULFATE (PF) 2 MG/ML IV SOLN
1.0000 mg | INTRAVENOUS | Status: DC | PRN
  Administered 2024-07-12 (×2): 1 mg via INTRAVENOUS
  Filled 2024-07-12 (×2): qty 1

## 2024-07-12 MED ORDER — ALBUMIN HUMAN 25 % IV SOLN
12.5000 g | Freq: Four times a day (QID) | INTRAVENOUS | Status: AC
  Administered 2024-07-12 (×2): 12.5 g via INTRAVENOUS
  Filled 2024-07-12 (×2): qty 50

## 2024-07-12 MED ORDER — FUROSEMIDE 10 MG/ML IJ SOLN
60.0000 mg | Freq: Two times a day (BID) | INTRAMUSCULAR | Status: AC
Start: 2024-07-12 — End: 2024-07-13
  Administered 2024-07-12 – 2024-07-13 (×4): 60 mg via INTRAVENOUS
  Filled 2024-07-12 (×4): qty 6

## 2024-07-12 MED ORDER — AMLODIPINE BESYLATE 10 MG PO TABS: 10.0000 mg | ORAL_TABLET | Freq: Every day | ORAL | Status: DC

## 2024-07-12 MED ORDER — OXYCODONE HCL 5 MG PO TABS
5.0000 mg | ORAL_TABLET | Freq: Four times a day (QID) | ORAL | Status: DC | PRN
Start: 2024-07-12 — End: 2024-07-17
  Administered 2024-07-12 – 2024-07-14 (×3): 5 mg via ORAL
  Filled 2024-07-12 (×3): qty 1

## 2024-07-12 MED ORDER — PROCHLORPERAZINE EDISYLATE 10 MG/2ML IJ SOLN
5.0000 mg | Freq: Once | INTRAMUSCULAR | Status: AC
  Administered 2024-07-12: 5 mg via INTRAVENOUS
  Filled 2024-07-12: qty 2

## 2024-07-12 MED ORDER — INSULIN ASPART 100 UNIT/ML IJ SOLN
0.0000 [IU] | Freq: Three times a day (TID) | INTRAMUSCULAR | Status: DC
  Administered 2024-07-13 (×2): 1 [IU] via SUBCUTANEOUS

## 2024-07-12 MED ORDER — ENOXAPARIN SODIUM 40 MG/0.4ML IJ SOSY: 40.0000 mg | PREFILLED_SYRINGE | Freq: Every day | INTRAMUSCULAR | Status: DC

## 2024-07-12 MED ORDER — HYDRALAZINE HCL 25 MG PO TABS
25.0000 mg | ORAL_TABLET | Freq: Three times a day (TID) | ORAL | Status: DC
  Administered 2024-07-12 (×2): 25 mg via ORAL
  Filled 2024-07-12 (×2): qty 1

## 2024-07-12 MED ORDER — METHOCARBAMOL 500 MG PO TABS
500.0000 mg | ORAL_TABLET | Freq: Three times a day (TID) | ORAL | Status: DC | PRN
  Administered 2024-07-13 – 2024-07-16 (×3): 500 mg via ORAL
  Filled 2024-07-12 (×3): qty 1

## 2024-07-12 MED ORDER — POTASSIUM CHLORIDE 20 MEQ PO PACK
40.0000 meq | PACK | Freq: Once | ORAL | Status: AC
  Administered 2024-07-12: 40 meq via ORAL
  Filled 2024-07-12: qty 2

## 2024-07-12 NOTE — Plan of Care (Signed)
  Problem: Clinical Measurements: Goal: Will remain free from infection Outcome: Progressing Goal: Respiratory complications will improve Outcome: Progressing Goal: Cardiovascular complication will be avoided Outcome: Progressing   Problem: Activity: Goal: Risk for activity intolerance will decrease Outcome: Progressing   Problem: Elimination: Goal: Will not experience complications related to bowel motility Outcome: Progressing Goal: Will not experience complications related to urinary retention Outcome: Progressing   Problem: Safety: Goal: Ability to remain free from injury will improve Outcome: Progressing

## 2024-07-12 NOTE — Progress Notes (Signed)
 Heart Failure Navigator Progress Note  Assessed for Heart & Vascular TOC clinic readiness.  Patient does not meet criteria due to she is seen by [facility] Cardiology. No HF TOC. .   Navigator will sign off at this time.   Stephane Haddock, BSN, Scientist, clinical (histocompatibility and immunogenetics) Only

## 2024-07-12 NOTE — Plan of Care (Signed)

## 2024-07-12 NOTE — Procedures (Signed)
 PROCEDURE SUMMARY:  Successful US  guided paracentesis from left lateral abdomen.  Yielded 4.5 liters of cloudy yellow fluid.  No immediate complications.  Patient tolerated well.  EBL = trace  Specimen sent for labs.  Anasophia Pecor CHRISTELLA Bal PA-C 07/12/2024 10:01 AM

## 2024-07-12 NOTE — Consult Note (Addendum)
 Consultation  Referring Provider: TRH/ Franky  Primary Care Physician:  Samie Frederick, PA-C Primary Gastroenterologist:  Louanna Plough   Reason for Consultation: Anasarca, history of cirrhosis, CHF  HPI: Susan Hogan is a 63 y.o. female with multiple significant comorbidities including COPD, rheumatoid arthritis, diabetes mellitus, fibromyalgia, cirrhosis felt secondary to hepatitis C/not treated, poorly controlled hypertension, congestive heart failure with last echo July 2025 showing EF 67to 69%. Presented to the emergency room last night with progressive shortness of breath.  She has developed significant lower extremity edema over the past month, and with that has had progressive exertional dyspnea even at short distances.  She denied any chest pain on admission. She has been seeing [facility] cardiology, is currently wearing a Zio patch was supposed to come off today.  She reports that when she was seen about 6 weeks ago ago she was given a couple of new medications.  She was concerned because she seemed to start developing increasing lower extremity edema after one of the new blood pressure medicines.  I think she was confused about what she was taking but it sounds like she stopped all of her heart medicines and diuretics other than one of the medicines that starts within a. On further discussion today she was not certain that she had a diagnosis of cirrhosis but knew that she has had hepatitis C.  By review of her chart she was treated briefly in 2022 but did not tolerate treatment with Mavyret and this was stopped.  She did not come back for follow-up as instructed.  Her last viral load was over 1 million as of June 2024.  At that time was seeing digestive health.  She was also scheduled for EGD and colonoscopy.  I cannot see the results of those reports.  Patient tells me that she was only able to have the upper endoscopy done and that she was not prepped well enough to do the  colonoscopy.  She did not return to have that done though she says she has been concerned about having problems with diarrhea over the past year usually having at least 3-4 bowel movements per day of loose stool.  She has recently developed some lower abdominal cramping along with this and says she had recently noted some streaks of blood in the diarrhea and is not certain whether this was due to hemorrhoids or other issue.  In the ER last p.m. with severe hypertension 221/100. Chest x-ray showed low lung volumes, no overt pulmonary edema. BNP 4600 Troponin 21 WBC 6.7/hemoglobin 10.7/hematocrit 30.2/MCV 92/platelets 151   Potassium 3.2 BUN 161/creatinine 1.22 Albumin 2.6 T. bili 0.4/alk phos 67/AST 90/ALT 52 Iron studies normal folate 12.2/B12 356  She did have noncontrasted CT of the abdomen and pelvis that shows a cirrhotic appearing liver status post cholecystectomy, there was a suitable amount of ascites present as well as evidence of anasarca and severe diverticulosis.  She underwent large-volume paracentesis this morning with 4.5 L removed Cell counts in process Stain no organisms Fluid albumin less than 1.5/total protein less than 3  She says she feels about the same today, no better or worse.  She is being diuresed with IV Lasix  60 twice daily and has been started on IV albumin every 6 hours. Blood pressure much better currently with systolic in the 130s.  She reports that she has an upcoming appointment with [facility] here in [facility] December 2025   Past Medical History:  Diagnosis Date   Anemia  Arthritis    RA states does not see Rheumatologist   Asthma    Carpal tunnel syndrome    bilaterally   Complication of anesthesia    difficulty breathing   COPD (chronic obstructive pulmonary disease) (HCC)    COVID-19    Degenerative disk disease    back   Depression    Diabetes mellitus    Fibromyalgia    GERD (gastroesophageal reflux disease)     Headache(784.0)    migraines   Heart murmur    Hepatitis    In followup treatment for hepatitis C   Hypertension    no medication for at this time, sees Dr. Annetta Pouch Pcp   Hypotensive episode    hx of   Irritable bowel syndrome    Kidney stone    with hematuria   Memory loss    Migraines    Recurrent upper respiratory infection (URI)    Shortness of breath    Syncope     Past Surgical History:  Procedure Laterality Date   CHOLECYSTECTOMY  2019   DILATION AND CURETTAGE OF UTERUS     IR PARACENTESIS  07/12/2024   Left knee surgery     LIVER BIOPSY     TONSILLECTOMY     TUBAL LIGATION      Prior to Admission medications   Medication Sig Start Date End Date Taking? Authorizing Provider  albuterol  (PROVENTIL ) (2.5 MG/3ML) 0.083% nebulizer solution Take 2.5 mg by nebulization every 6 (six) hours as needed for wheezing or shortness of breath.   Yes [provider]  albuterol  (VENTOLIN  HFA) 108 (90 Base) MCG/ACT inhaler Inhale 1-2 puffs into the lungs every 6 (six) hours as needed for wheezing or shortness of breath. 02/10/23  Yes Hope Almarie ORN, NP  amLODipine  (NORVASC ) 10 MG tablet Take 1 tablet (10 mg total) by mouth daily. 01/25/24 07/12/24 Yes Upstill, Margit, PA-C  azelastine  (ASTELIN ) 0.1 % nasal spray Place 1 spray into both nostrils 2 (two) times daily as needed for allergies. 02/07/24  Yes [provider]  cetirizine  (ZYRTEC ) 10 MG tablet Take 1 tablet (10 mg total) by mouth daily as needed for allergies. 05/14/23  Yes Padgett, Danita Macintosh, MD  fluticasone  (FLONASE ) 50 MCG/ACT nasal spray Place 1 spray into both nostrils daily as needed for allergies. 12/19/19  Yes [provider]  furosemide  (LASIX ) 20 MG tablet Take 1 tablet (20 mg total) by mouth daily. Patient taking differently: Take 20 mg by mouth daily as needed for fluid. 01/25/24  Yes Upstill, Margit, PA-C  ibuprofen  (ADVIL ) 200 MG tablet Take 200 mg by mouth every 6 (six) hours as  needed for headache.   Yes [provider]  methocarbamol  (ROBAXIN ) 500 MG tablet Take 1 tablet (500 mg total) by mouth every 8 (eight) hours as needed for muscle spasms. 06/05/24  Yes Sponseller, Pleasant SAUNDERS, PA-C  Olopatadine  HCl (PATADAY ) 0.2 % SOLN Place 1 drop into both eyes 1 day or 1 dose. Patient taking differently: Place 1 drop into both eyes daily as needed (allergies). 05/14/23  Yes Padgett, Danita Macintosh, MD  Atogepant  (QULIPTA ) 60 MG TABS Take 1 tablet (60 mg total) by mouth daily. Patient not taking: Reported on 07/12/2024 01/28/24   Skeet Juliene SAUNDERS, DO  hydrALAZINE  (APRESOLINE ) 25 MG tablet Take 25 mg by mouth in the morning and at bedtime. Patient not taking: Reported on 07/12/2024 06/07/24   [provider]  hydrochlorothiazide (HYDRODIURIL) 25 MG tablet Take 25 mg by mouth daily.  Patient not taking: Reported on 07/12/2024 02/05/24   [provider]  olmesartan  (BENICAR ) 40 MG tablet Take 40 mg by mouth daily. Patient not taking: Reported on 07/12/2024    [provider]  potassium chloride  (KLOR-CON  M) 10 MEQ tablet Take 10 mEq by mouth daily. Patient not taking: Reported on 07/12/2024 02/07/24   [provider]  spironolactone (ALDACTONE) 50 MG tablet Take 50 mg by mouth daily. Patient not taking: Reported on 07/12/2024 04/11/24   [provider]  dicyclomine  (BENTYL ) 20 MG tablet Take 1 tablet (20 mg total) by mouth 2 (two) times daily. Patient not taking: Reported on 10/01/2019 08/19/19 10/01/19  Joy, Elouise C, PA-C  diphenhydrAMINE  (BENADRYL ) 25 MG tablet Take 1 tablet (25 mg total) by mouth every 6 (six) hours as needed. Patient not taking: Reported on 10/01/2019 09/24/19 10/01/19  Petrucelli, Samantha R, PA-C  escitalopram  (LEXAPRO ) 10 MG tablet TAKE 1 TABLET(10 MG) BY MOUTH DAILY Patient not taking: Reported on 07/05/2018 06/01/18 09/24/19  Prentiss Frieze, DO  labetalol  (NORMODYNE ) 100 MG tablet Take 1 tablet (100 mg total) by mouth 2  (two) times daily. Patient not taking: Reported on 10/01/2019 08/30/19 10/01/19  Zammit, Joseph, MD  topiramate  (TOPAMAX ) 100 MG tablet Take 1 tablet (100 mg total) by mouth at bedtime. Patient not taking: Reported on 10/01/2019 03/09/19 10/01/19  Ines Onetha NOVAK, MD    Current Facility-Administered Medications  Medication Dose Route Frequency Provider Last Rate Last Admin   albumin human 25 % solution 12.5 g  12.5 g Intravenous Q6H Franky Redia SAILOR, MD 60 mL/hr at 07/12/24 0647 12.5 g at 07/12/24 9352   amLODipine  (NORVASC ) tablet 10 mg  10 mg Oral Daily Kakrakandy, Arshad N, MD   10 mg at 07/12/24 0207   cefTRIAXone  (ROCEPHIN ) 2 g in sodium chloride  0.9 % 100 mL IVPB  2 g Intravenous Daily Kakrakandy, Arshad N, MD 200 mL/hr at 07/12/24 0908 2 g at 07/12/24 0908   furosemide  (LASIX ) injection 60 mg  60 mg Intravenous BID Kakrakandy, Arshad N, MD   60 mg at 07/12/24 9148   hydrALAZINE  (APRESOLINE ) injection 10 mg  10 mg Intravenous Q4H PRN Kakrakandy, Arshad N, MD   10 mg at 07/12/24 0008   hydrALAZINE  (APRESOLINE ) tablet 25 mg  25 mg Oral Q8H Kakrakandy, Arshad N, MD   25 mg at 07/12/24 9356   insulin  aspart (novoLOG ) injection 0-9 Units  0-9 Units Subcutaneous TID WC Franky Redia SAILOR, MD       methocarbamol  (ROBAXIN ) tablet 500 mg  500 mg Oral Q8H PRN Dahal, Chapman, MD       morphine  (PF) 2 MG/ML injection 1 mg  1 mg Intravenous Q4H PRN Dahal, Binaya, MD   1 mg at 07/12/24 1052   ondansetron  (ZOFRAN ) injection 4 mg  4 mg Intravenous Q6H PRN Arlice Chapman, MD        Allergies as of 07/11/2024 - Reviewed 07/11/2024  Allergen Reaction Noted   Anesthetic ether [ether] Shortness Of Breath 11/25/2011   Nubain [nalbuphine hcl] Anaphylaxis 06/14/2012   Penicillins Hives and Shortness Of Breath    Lisinopril  Hives 12/19/2018   Advair diskus [fluticasone -salmeterol] Hives 03/16/2013   Lactose intolerance (gi) Diarrhea 11/24/2013   Tizanidine  Palpitations and Other (See Comments) 02/19/2020     Family History  Problem Relation Age of Onset   Heart disease Mother    Diabetes Mother    Asthma Mother    Anemia Mother    Cerebral aneurysm Mother  ruptured   Kidney failure Mother    Migraines Mother    Parkinson's disease Mother    Colon cancer Maternal Uncle    Heart disease Maternal Uncle    Heart disease Maternal Grandmother    Diabetes Maternal Grandmother    Asthma Maternal Grandmother    Heart Problems Maternal Grandmother    Heart Problems Other        through family   Diabetes Granddaughter    Diabetes Daughter     Social History   Socioeconomic History   Marital status: Single    Spouse name: Not on file   Number of children: 7   Years of education: Not on file   Highest education level: Not on file  Occupational History   Occupation: Disabled  Tobacco Use   Smoking status: Never    Passive exposure: Never   Smokeless tobacco: Never  Vaping Use   Vaping status: Never Used  Substance and Sexual Activity   Alcohol use: Never    Alcohol/week: 0.0 standard drinks of alcohol   Drug use: Never   Sexual activity: Not on file  Other Topics Concern   Not on file  Social History Narrative   Lives at home. Has two sons and her grandchildren with her.    Right handed   Caffeine : very rare      Patient  stated that she is [facility] living [facility] since June 2025   Social Drivers of Health   Financial Resource Strain: Low Risk  (01/13/2024)   Received from [facility]   Overall Financial Resource Strain (CARDIA)    Difficulty of Paying Living Expenses: Not hard at all  Recent Concern: Financial Resource Strain - Medium Risk (11/02/2023)   Received from [facility]   Overall Financial Resource Strain (CARDIA)    Difficulty of Paying Living Expenses: Somewhat hard  Food Insecurity: No Food Insecurity (07/11/2024)   Hunger Vital Sign    Worried About Running Out of Food in the Last Year: Never true    Ran Out of  Food in the Last Year: Never true  Transportation Needs: Unknown (07/11/2024)   PRAPARE - Transportation    Lack of Transportation (Medical): No    Lack of Transportation (Non-Medical): Patient declined  Physical Activity: Insufficiently Active (01/13/2024)   Received from [facility]   Exercise Vital Sign    On average, how many days per week do you engage in moderate to strenuous exercise (like a brisk walk)?: 2 days    On average, how many minutes do you engage in exercise at this level?: 30 min  Stress: No Stress Concern Present (04/08/2024)   Received from [facility] Occupational [facility] - Occupational Stress Questionnaire    Do you feel stress - tense, restless, nervous, or anxious, or unable to sleep at night because your mind is troubled all the time - these days?: Only a little  Recent Concern: Stress - Stress Concern Present (01/13/2024)   Received from [facility] Occupational Health - Occupational Stress Questionnaire    Feeling of Stress : Very much  Social Connections: Socially Isolated (07/11/2024)   Social Connection and Isolation Panel    Frequency of Communication with Friends and Family: Twice a week    Frequency of Social Gatherings with Friends and Family: Never    Attends Religious Services: Never    Database administrator or Organizations: No    Attends  Club or Organization Meetings: Never    Marital Status: Never married  Intimate Partner Violence: Not At Risk (07/11/2024)   Humiliation, Afraid, Rape, and Kick questionnaire    Fear of Current or Ex-Partner: No    Emotionally Abused: No    Physically Abused: No    Sexually Abused: No    Review of Systems: Pertinent positive and negative review of systems were noted in the above HPI section.  All other review of systems was otherwise negative.  Physical Exam: Vital signs in last 24 hours: Temp:  [98 F (36.7 C)-98.7 F (37.1 C)] 98.7 F (37.1 C) (10/22  0712) Pulse Rate:  [67-89] 89 (10/22 0712) Resp:  [16-23] 16 (10/22 0712) BP: (127-224)/(64-112) 134/64 (10/22 1010) SpO2:  [97 %-100 %] 97 % (10/22 0712) Weight:  [97.3 kg] 97.3 kg (10/21 2054) Last BM Date : 07/11/24 General:   Alert,  Well-developed, obese older female, pleasant and cooperative in NAD Head:  Normocephalic and atraumatic. Eyes:  Sclera clear, no icterus.   Conjunctiva pink. Ears:  Normal auditory acuity. Nose:  No deformity, discharge,  or lesions. Mouth:  No deformity or lesions.   Neck:  Supple; no masses or thyromegaly.No JVD Lungs:bibasilar crackles, few wheezes Heart:  Regular rate and rhythm;soft systolic murmur Abdomen:  obese, soft, no focal tenderness + ascites, , and pitting edema in lowe abd wall and flanks   Rectal:  not done Msk:  Symmetrical without gross deformities. . Pulses:  Normal pulses noted. Extremities: 2+ edema bilateral lower extremities to above the knees, there is some fluid medially in the thighs Neurologic:  Alert and  oriented x4;  grossly normal neurologically. No asterixis Skin:  Intact without significant lesions or rashes.. Psych:  Alert and cooperative. Normal mood and affect.  Intake/Output from previous day: 10/21 0701 - 10/22 0700 In: 600 [P.O.:600] Out: 1450 [Urine:1450] Intake/Output this shift: Total I/O In: 120 [P.O.:120] Out: 550 [Urine:550]  Lab Results: Recent Labs    07/11/24 1243 07/12/24 0242  WBC 6.3 7.6  HGB 10.7* 12.4  HCT 30.2* 35.9*  PLT 151 217   BMET Recent Labs    07/11/24 1243 07/12/24 0242  NA 144 143  K 3.2* 3.5  CL 111 114*  CO2 22 20*  GLUCOSE 117* 115*  BUN 16 15  CREATININE 1.22* 1.38*  CALCIUM  8.5* 8.3*   LFT Recent Labs    07/11/24 1243  PROT 4.7*  ALBUMIN 2.6*  AST 90*  ALT 52*  ALKPHOS 67  BILITOT 0.4   PT/INR No results for input(s): LABPROT, INR in the last 72 hours. Hepatitis Panel No results for input(s): HEPBSAG, HCVAB, HEPAIGM, HEPBIGM in the  last 72 hours.   IMPRESSION:  #62 63 year old female admitted through the emergency room with progressive dyspnea over the past 3 weeks, and progressive severe lower extremity edema. Patient has diagnosis of congestive heart failure but last echo showed EF of 65 to 70% earlier this year.  Also with diagnosis of cirrhosis secondary to hepatitis C which appears decompensated. She has developed significant increase in abdominal girth over the past month as well. Workup in the ER last p.m. with BNP of 4600, chest x-ray without evidence of overt pulmonary edema. Noncontrasted CT with cirrhotic appearing liver and significant ascites  Patient is being managed for anasarca currently being diuresed with IV Lasix , and has been placed on IV albumin every 6 hours.  she had large-volume paracentesis this morning-cell counts pending SAAG=1.1 suggesting underlying portal hypertension.  Will check  INR and then calculate MELD-Na  With significantly elevated BNP also need to consider component secondary to acute worsening of heart failure  Patient was also confused about her medications, and suspect she had stopped taking her diuretics.  #2 diabetes mellitus #3.  Chronic kidney disease stage III #4 hepatitis C/not treated-viral load 1 year ago quite high #5 chronic diarrhea-anoscopy attempted 2024/digestive health but apparently not prepped and therefore not done #6 hypertensive urgency #7 normocytic anemia    PLAN: 2 g sodium carb modified diet Patient needs repeat 2D echo  Will follow-up on peritoneal fluid cell counts, rule out SBP He is being covered with Rocephin  Will check hep C RNA quant, pro time/INR, and AFP Continue diuresis with careful monitoring of renal function She will need a colonoscopy at some point, she would like to have this done inpatient, would prefer to defer until she has further significant diuresis.  She seems to be being followed by multiple specialists within  different healthcare systems which seems to be fractionating her care some.  I encouraged her to consolidate her specialist with in 1 system.  Encouraged her to keep follow-up with [facility] on discharge.  She may ultimately need to be referred to [facility] for regular follow-up of her decompensated liver disease. GI will follow with you during admission.    Amy Esterwood PA-C 07/12/2024, 12:40 PM  I have taken an interval history, thoroughly reviewed the chart and examined the patient. I agree with the Advanced Practitioner's note, impression and recommendations, and have recorded additional findings, impressions and recommendations below. I performed a substantive portion of this encounter (>50% time spent), including a complete performance of the medical decision making.  My additional thoughts are as follows:  Decompensated cirrhosis from hepatitis C virus, previous partial treatment without clearance. Patient also has CHF and is managed by an outside cardiology clinic.  Inpatient echocardiogram warranted with that history, the anasarca and elevated BNP.  However, suspect that the liver disease is causing the majority if not all of the volume overload.  She received a 4.5 L paracentesis with studies showing the fluid is portal hypertensive in nature, and there is no SBP. No coagulopathy, thrombocytopenia or hepatic encephalopathy.  No overt GI bleeding.  Sounds like she had an upper endoscopy with the last couple of years by an outside GI practice.  Was unable to have a colonoscopy due to insufficient prep and has chronic diarrhea.  No current plans for endoscopic testing.  I agree she needs cautious diuresis with albumin administration.  Mildly increasing creatinine shows that she has some renal sensitivity to the diuresis.   She will need ongoing outpatient GI management by her current practice (or perhaps another practice that she may be changing over 2 in a couple of months).   She would also benefit from hepatology evaluation in the [facility] system for consideration of hepatitis C virus treatment candidacy.  That is probably months down the road after better management of her decompensated cirrhosis and volume overload.  Low-sodium diet   _________________  This consultation required a high degree of medical decision making due to the nature and complexity of the acute condition(s) being evaluated as well as the patient's medical comorbidities.  [facility] [phone]

## 2024-07-12 NOTE — TOC CM/SW Note (Signed)
 Transition of Care [facility]) - Inpatient Brief Assessment   Patient Details  Name: Susan Hogan MRN: [id number] Date of Birth: [birth year]  [facility] CM/SW Contact:    Luise JAYSON Pan, LCSWA Phone Number: 07/12/2024, 8:31 AM   Clinical Narrative: Per chart review, patient is from home. Patient has a PCP and uses Development worker, community. Patient has no prior SNF or HH hx.   ICM (inpatient care management) will continue to monitor through daily progression meetings.    Transition of Care Asessment: Insurance and Status: Insurance coverage has been reviewed Patient has primary care physician: Yes Home environment has been reviewed: Home Prior level of function:: Indpendent Prior/Current Home Services: No current home services Social Drivers of Health Review: SDOH reviewed no interventions necessary Readmission risk has been reviewed: Yes Transition of care needs: transition of care needs identified, TOC will continue to follow

## 2024-07-12 NOTE — Progress Notes (Signed)
 PROGRESS NOTE  Susan Hogan  DOB: 04/27/1961  PCP: Samie Frederick, PA-C FMW:[id number]  DOA: 07/11/2024  LOS: 0 days  Hospital Day: 2  Subjective: Patient was seen and examined this morning. Pleasant elderly female.  Lying on bed.  Not in distress. Afebrile, heart rate in 80s Blood pressure overnight as high as 224/99, down to 130s this morning, breathing on room air  Brief narrative: Susan Hogan is a 63 y.o. female with PMH significant for morbid obesity, OSA, DM2, HTN, HLD, depression, fibromyalgia, liver cirrhosis 2/2 hepatitis C (treated) 10/21, patient presented to the ED with c/o progressively worsening lower extremity edema and abdominal distention for 2 weeks causing difficulty in ambulation.  Last hospitalized at [facility] system 7/18-7/21 for hypervolemia.  Echo at that time showed EF of 67 to 69% with mild LV diastolic dysfunction.  She was also seen by cardiologist.  Discharged on diuretics and other antihypertensives.  Reports compliance to those.  In the ED, patient was afebrile, heart rate in 60s and 70s, initial blood pressure elevated to 218/106, breathing on room air Labs with WBC count 6.3, hemoglobin 10.7, potassium 3.2, magnesium  1.6, BUN/creatinine 16/1.22, AST/ALT 90/52, proBNP over 4000, troponin 21 > 26 Urinalysis showed clear colorless urine with moderate hemoglobin Chest x-ray unremarkable Ultrasound duplex bilateral lower extremities negative for DVT. CT abdomen pelvis showed Liver cirrhosis with considerable ascites, anasarca  Admitted to [facility]  Assessment and plan: Volume overload status Decompensated liver cirrhosis 2/2 Hep C Presented with progressively worsening abdominal distention, lower extremity edema in the setting of liver cirrhosis portal hypertension and questionable compliance to diuretics. Follows up with [facility].  Reports she was treated for hep C there. Last EF measured was 67 to 69% on April 09, 2024.  ProBNP elevated to over  4000. Patient was started on IV Lasix  40 mg twice daily Underwent paracentesis today with 4.5 L removed. Currently on empiric IV antibiotics as well [facility] consulted. Continue to monitor for daily intake output, weight, blood pressure, BNP, renal function and electrolytes. Net IO Since Admission: -1,260 mL [07/12/24 1324] Recent Labs  Lab 07/11/24 1243 07/12/24 0242  PROBNP 4,021.0*  --   BUN 16 15  CREATININE 1.22* 1.38*  NA 144 143  K 3.2* 3.5  MG 1.6* 1.9   Hypertensive urgency  Blood pressure significantly elevated over 220 last night.   Gradually improved this morning with IV hydralazine  and IV Lasix   Home med list shows amlodipine , hydralazine , Lasix , home HCTZ, olmesartan , Aldactone. Not sure if she has been compliant 2 days Currently ordered for IV Lasix  as above, amlodipine  and hydralazine .  I would continue Lasix . But hold amlodipine  and hydralazine  to create room for diuresis.  Hypokalemia Potassium level improved with replacement.  Elevated troponin Slightly elevated.  Likely due to LV strain from volume overload Recent Labs    07/12/24 0242 07/12/24 0623  TROPONINIHS 21* 26*   AKI on CKD 3A Baseline creatinine from May was 1.03 Creatinine elevated 1.38 today.  Continue to monitor with IV diuretics.  Recent Labs    10/01/23 1550 10/13/23 0645 01/25/24 1805 07/11/24 1243 07/12/24 0242  BUN 16 21 13 16 15   CREATININE 0.74 0.88 1.03* 1.22* 1.38*  CO2 24 21* 23 22 20*   H/o type 2 diabetes mellitus A1c is actually low at 3.9 on 07/12/2024 PTA meds-not on meds Continue fingerstick blood sugar monitor Recent Labs  Lab 07/12/24 0618 07/12/24 1128  GLUCAP 118* 98   Anemia  follow CBC Recent Labs  10/01/23 1550 10/13/23 0645 01/25/24 1805 07/11/24 1243 07/12/24 0242  HGB 12.4 13.7 11.3* 10.7* 12.4  MCV 94.9 94.7 95.1 92.9 94.0  VITAMINB12  --   --   --   --  356  FOLATE  --   --   --   --  12.2  FERRITIN  --   --   --   --  94  TIBC   --   --   --   --  379  IRON  --   --   --   --  49  RETICCTPCT  --   --   --   --  3.0   Ovarian cyst CT abdomen noted slight increase in left ovarian cystic lesion size when compared with the prior exam.  Radiologist recommend follow-up US  in 6-12 months.    Diverticulosis without diverticulitis Avoid constipation  H/o migraine Used to be on Qulipta .   Mobility: Encourage ambulation   Goals of care   Code Status: Full Code     DVT prophylaxis:  Place and maintain sequential compression device Start: 07/12/24 0219   Antimicrobials: IV Rocephin  Fluid: None Consultants: GI Family Communication: None at bedside  Status: Inpatient Level of care:  Telemetry Cardiac   Patient is from: Home Needs to continue in-hospital care: Ongoing workup and management Anticipated d/c to: Hopefully home eventually    Diet:  Diet Order             Diet heart healthy/carb modified Room service appropriate? Yes; Fluid consistency: Thin; Fluid restriction: 1200 mL Fluid  Diet effective now                   Scheduled Meds:  furosemide   60 mg Intravenous BID   insulin  aspart  0-9 Units Subcutaneous TID WC    PRN meds: hydrALAZINE , methocarbamol , morphine  injection, ondansetron  (ZOFRAN ) IV   Infusions:   albumin human 12.5 g (07/12/24 0647)   cefTRIAXone  (ROCEPHIN )  IV 2 g (07/12/24 0908)    Antimicrobials: Anti-infectives (From admission, onward)    Start     Dose/Rate Route Frequency Ordered Stop   07/12/24 0800  cefTRIAXone  (ROCEPHIN ) 2 g in sodium chloride  0.9 % 100 mL IVPB        2 g 200 mL/hr over 30 Minutes Intravenous Daily 07/12/24 0636         Objective: Vitals:   07/12/24 1010 07/12/24 1321  BP: 134/64 134/64  Pulse:    Resp:    Temp:    SpO2:      Intake/Output Summary (Last 24 hours) at 07/12/2024 1324 Last data filed at 07/12/2024 1240 Gross per 24 hour  Intake 1040 ml  Output 2300 ml  Net -1260 ml   Filed Weights   07/11/24 2054   Weight: 97.3 kg   Weight change:  Body mass index is 43.33 kg/m.   Physical Exam: General exam: Pleasant, elderly Caucasian female Skin: No rashes, lesions or ulcers. HEENT: Atraumatic, normocephalic, no obvious bleeding Lungs: Clear to auscultation bilaterally,  CVS: S1, S2, no murmur,   GI/Abd: Soft, nontender, nondistended, bowel sound present,   CNS: Alert, awake, oriented x 3 Psychiatry: Mood appropriate Extremities: 1+ bilateral pedal edema, no calf tenderness,   Data Review: I have personally reviewed the laboratory data and studies available.  F/u labs ordered Unresulted Labs (From admission, onward)     Start     Ordered   07/13/24 0500  Basic metabolic panel  Tomorrow morning,  R       Question:  Specimen collection method  Answer:  Lab=Lab collect   07/12/24 1315   07/12/24 1314  AFP tumor marker  Once,   R       Question:  Specimen collection method  Answer:  Lab=Lab collect   07/12/24 1313   07/12/24 1314  Protime-INR  Once,   R       Question:  Specimen collection method  Answer:  Lab=Lab collect   07/12/24 1313   07/12/24 1314  HCV RNA quant rflx ultra or genotyp  Once,   R       Question:  Specimen collection method  Answer:  Lab=Lab collect   07/12/24 1313   07/12/24 0050  HIV Antibody (routine testing w rflx)  (HIV Antibody (Routine testing w reflex) panel)  Once,   R        07/12/24 0051   07/11/24 2044  Miscellaneous [facility] test (send-out)  Once,   R        07/11/24 2044            Signed, Chapman Rota, MD [facility] 07/12/2024

## 2024-07-13 ENCOUNTER — Inpatient Hospital Stay (HOSPITAL_COMMUNITY)

## 2024-07-13 DIAGNOSIS — R188 Other ascites: Secondary | ICD-10-CM

## 2024-07-13 DIAGNOSIS — I5033 Acute on chronic diastolic (congestive) heart failure: Secondary | ICD-10-CM

## 2024-07-13 DIAGNOSIS — K529 Noninfective gastroenteritis and colitis, unspecified: Secondary | ICD-10-CM | POA: Diagnosis not present

## 2024-07-13 DIAGNOSIS — B182 Chronic viral hepatitis C: Secondary | ICD-10-CM | POA: Diagnosis not present

## 2024-07-13 DIAGNOSIS — R600 Localized edema: Secondary | ICD-10-CM | POA: Diagnosis not present

## 2024-07-13 DIAGNOSIS — K7460 Unspecified cirrhosis of liver: Secondary | ICD-10-CM | POA: Diagnosis not present

## 2024-07-13 LAB — MISC LABCORP TEST (SEND OUT): Labcorp test code: 140150

## 2024-07-13 LAB — ECHOCARDIOGRAM COMPLETE
Area-P 1/2: 3.33 cm2
Height: 59 in
S' Lateral: 2.7 cm
Weight: 3241.64 [oz_av]

## 2024-07-13 LAB — BASIC METABOLIC PANEL WITH GFR
Anion gap: 8 (ref 5–15)
BUN: 16 mg/dL (ref 8–23)
CO2: 24 mmol/L (ref 22–32)
Calcium: 7.9 mg/dL — ABNORMAL LOW (ref 8.9–10.3)
Chloride: 110 mmol/L (ref 98–111)
Creatinine, Ser: 1.49 mg/dL — ABNORMAL HIGH (ref 0.44–1.00)
GFR, Estimated: 39 mL/min — ABNORMAL LOW (ref >60)
Glucose, Bld: 92 mg/dL (ref 70–99)
Potassium: 3.5 mmol/L (ref 3.5–5.1)
Sodium: 142 mmol/L (ref 135–145)

## 2024-07-13 LAB — GLUCOSE, CAPILLARY
Glucose-Capillary: 114 mg/dL — ABNORMAL HIGH (ref 70–99)
Glucose-Capillary: 122 mg/dL — ABNORMAL HIGH (ref 70–99)
Glucose-Capillary: 121 mg/dL — ABNORMAL HIGH (ref 70–99)
Glucose-Capillary: 107 mg/dL — ABNORMAL HIGH (ref 70–99)

## 2024-07-13 LAB — CYTOLOGY - NON PAP

## 2024-07-13 LAB — AFP TUMOR MARKER: AFP, Serum, Tumor Marker: 11.5 ng/mL — ABNORMAL HIGH (ref 0.0–9.2)

## 2024-07-13 MED ORDER — FUROSEMIDE 10 MG/ML IJ SOLN: 60.0000 mg | Freq: Every day | INTRAMUSCULAR | Status: DC

## 2024-07-13 MED ORDER — FUROSEMIDE 10 MG/ML IJ SOLN: 80.0000 mg | Freq: Two times a day (BID) | INTRAMUSCULAR | Status: DC

## 2024-07-13 MED ORDER — FLUTICASONE PROPIONATE 50 MCG/ACT NA SUSP
1.0000 | Freq: Every day | NASAL | Status: DC
  Administered 2024-07-13 – 2024-07-17 (×5): 1 via NASAL
  Filled 2024-07-13: qty 16

## 2024-07-13 MED ORDER — ALBUTEROL SULFATE HFA 108 (90 BASE) MCG/ACT IN AERS: 1.0000 | INHALATION_SPRAY | RESPIRATORY_TRACT | Status: DC | PRN

## 2024-07-13 MED ORDER — SPIRONOLACTONE 25 MG PO TABS
25.0000 mg | ORAL_TABLET | Freq: Every day | ORAL | Status: DC
  Administered 2024-07-13 – 2024-07-17 (×5): 25 mg via ORAL
  Filled 2024-07-13 (×5): qty 1

## 2024-07-13 MED ORDER — AZELASTINE HCL 0.1 % NA SOLN
1.0000 | Freq: Two times a day (BID) | NASAL | Status: DC
Start: 2024-07-13 — End: 2024-07-17
  Administered 2024-07-13 – 2024-07-17 (×8): 1 via NASAL
  Filled 2024-07-13: qty 30

## 2024-07-13 MED ORDER — LACTULOSE 10 GM/15ML PO SOLN
20.0000 g | Freq: Two times a day (BID) | ORAL | Status: DC
  Administered 2024-07-13: 20 g via ORAL
  Filled 2024-07-13: qty 30

## 2024-07-13 MED ORDER — ALBUMIN HUMAN 25 % IV SOLN
25.0000 g | Freq: Four times a day (QID) | INTRAVENOUS | Status: AC
  Administered 2024-07-13 (×2): 25 g via INTRAVENOUS
  Filled 2024-07-13 (×2): qty 100

## 2024-07-13 MED ORDER — AMLODIPINE BESYLATE 5 MG PO TABS
5.0000 mg | ORAL_TABLET | Freq: Every day | ORAL | Status: DC
  Administered 2024-07-13 – 2024-07-17 (×5): 5 mg via ORAL
  Filled 2024-07-13 (×5): qty 1

## 2024-07-13 NOTE — TOC Progression Note (Addendum)
 Transition of Care [facility]) - Progression Note    Patient Details  Name: Susan Hogan MRN: [id number] Date of Birth: 28-Nov-1960  Transition of Care [facility] CM/SW Contact  Luise JAYSON Pan, [place] Phone Number: 07/13/2024, 11:24 AM  Clinical Narrative:   CSW provided community resources to address patients SDOH needs and other needs.   3:23 PM CSW followed up patient at bedside regarding consult. Patient explained that she no longer lives with her daughter and is now living at a senior community in her own apartment. Patient feels safe in her apartment. Patient stated she is going to follow up with her PCP for a letter about needing to live on a lower floor in her building. Patient stated she has 7 children in total, only 6 are still living. Patient stated 3/6 of her children have an estranged relationship with her.   ICM will continue to follow.    Expected Discharge Plan: Home/Self Care Barriers to Discharge: Continued Medical Work up               Expected Discharge Plan and Services                                               Social Drivers of Health (SDOH) Interventions SDOH Screenings   Food Insecurity: No Food Insecurity (07/11/2024)  Housing: High Risk (07/11/2024)  Transportation Needs: Unknown (07/11/2024)  Utilities: Not At Risk (07/11/2024)  Financial Resource Strain: Low Risk  (01/13/2024)   Received from [facility]  Recent Concern: Financial Resource Strain - Medium Risk (11/02/2023)   Received from [facility]  Physical Activity: Insufficiently Active (01/13/2024)   Received from [facility]  Social Connections: Socially Isolated (07/11/2024)  Stress: No Stress Concern Present (04/08/2024)   Received from [facility]  Recent Concern: Stress - Stress Concern Present (01/13/2024)   Received from [facility]  Tobacco Use: Low Risk  (07/12/2024)    Readmission Risk Interventions     No data to display

## 2024-07-13 NOTE — Progress Notes (Addendum)
 Patient ID: Susan Hogan, female   DOB: 1960-10-04, 63 y.o.   MRN: [id number]    Progress Note   Subjective   Day #2 CC: Anasarca, cirrhosis secondary to hep C/NASH, CHF  Paracentesis yesterday with removal of 4.5 l/cell counts not consistent with SBP/cultures pending Cytology negative for malignancy  Hep C RNA quant still pending AFP elevated at 11.5 INR 1.1/pro time 15.3 Sodium 142/potassium 3.5/BUN 16/creatinine 1.49  MELD 3.0: 14 at 07/13/2024  2:15 AM MELD-Na: 11 at 07/13/2024  2:15 AM Calculated from: Serum Creatinine: 1.49 mg/dL at 89/76/7974  7:84 AM Serum Sodium: 142 mmol/L (Using max of 137 mmol/L) at 07/13/2024  2:15 AM Total Bilirubin: 0.4 mg/dL (Using min of 1 mg/dL) at 89/78/7974 87:56 PM Serum Albumin: 2.6 g/dL at 89/78/7974 87:56 PM INR(ratio): 1.1 at 07/12/2024  2:35 PM Age at listing (hypothetical): 63 years Sex: Female at 07/13/2024  2:15 AM   Echo done today, pending  Patient says she has a headache today, she said some lightheadedness with ambulation, abdomen feels a little bit less tense, and though her legs are still quite edematous says she has noticed some improvement.  Still having diarrhea 2-3 times per day/chronic    Objective   Vital signs in last 24 hours: Temp:  [97.9 F (36.6 C)-99.2 F (37.3 C)] 98.8 F (37.1 C) (10/23 1300) Pulse Rate:  [77-88] 79 (10/23 1300) Resp:  [15-20] 16 (10/23 1137) BP: (145-172)/(66-97) 167/67 (10/23 1300) SpO2:  [96 %-99 %] 98 % (10/23 1300) Weight:  [91.9 kg] 91.9 kg (10/23 0336) Last BM Date : 07/12/24 General:    Older Hispanic female in NAD, pleasant Heart:  Regular rate and rhythm; no murmurs Lungs: Respirations even and unlabored, Abdomen:  Soft, obese, nontense ascites, she still has quite a bit of abdominal wall edema across the lower abdomen and into the flanks Extremities: 2+ edema bilateral lower extremities to the knee, 1+ edema medial thighs Neurologic:  Alert and oriented,  grossly normal  neurologically.  No asterixis Psych:  Cooperative. Normal mood and affect.  Intake/Output from previous day: 10/22 0701 - 10/23 0700 In: 760 [P.O.:560; IV Piggyback:200] Out: 3150 [Urine:3150] Intake/Output this shift: Total I/O In: 120 [P.O.:120] Out: 950 [Urine:950]  Lab Results: Recent Labs    07/11/24 1243 07/12/24 0242  WBC 6.3 7.6  HGB 10.7* 12.4  HCT 30.2* 35.9*  PLT 151 217   BMET Recent Labs    07/11/24 1243 07/12/24 0242 07/13/24 0215  NA 144 143 142  K 3.2* 3.5 3.5  CL 111 114* 110  CO2 22 20* 24  GLUCOSE 117* 115* 92  BUN 16 15 16   CREATININE 1.22* 1.38* 1.49*  CALCIUM  8.5* 8.3* 7.9*   LFT Recent Labs    07/11/24 1243  PROT 4.7*  ALBUMIN 2.6*  AST 90*  ALT 52*  ALKPHOS 67  BILITOT 0.4   PT/INR Recent Labs    07/12/24 1435  LABPROT 15.3*  INR 1.1    Studies/Results: IR Paracentesis Result Date: 07/12/2024 INDICATION: 63 year old female with a history of liver cirrhosis secondary to hepatitis C. Imaging notable for abdominal ascites. Request for diagnostic and therapeutic paracentesis. EXAM: ULTRASOUND GUIDED DIAGNOSTIC AND THERAPEUTIC, LEFT-SIDED PARACENTESIS MEDICATIONS: 1% lidocaine , 9 mL. COMPLICATIONS: None immediate. PROCEDURE: Informed written consent was obtained from the patient after a discussion of the risks, benefits and alternatives to treatment. A timeout was performed prior to the initiation of the procedure. Initial ultrasound scanning demonstrates a moderate amount of ascites within the left lower abdominal  quadrant. The left lower abdomen was prepped and draped in the usual sterile fashion. 1% lidocaine  was used for local anesthesia. Following this, a 19 gauge, 7-cm, Yueh catheter was introduced. An ultrasound image was saved for documentation purposes. The paracentesis was performed. The catheter was removed and a dressing was applied. The patient tolerated the procedure well without immediate post procedural complication.  FINDINGS: A total of approximately 4.5L of cloudy yellow fluid was removed. Samples were sent to the laboratory as requested by the clinical team. IMPRESSION: Successful ultrasound-guided paracentesis yielding 4.5 liters of peritoneal fluid. PLAN: If the patient eventually requires >/=2 paracenteses in a 30 day period, candidacy for formal evaluation by the [facility] Radiology [facility] will be assessed. Procedure performed by: Sherrilee Bal, PA-C under the supervision of Dr. ONEIDA Specking Electronically Signed   By: CHRISTELLA.  Shick M.D.   On: 07/12/2024 10:41   CT ABDOMEN PELVIS WO CONTRAST Result Date: 07/12/2024 CLINICAL DATA:  Acute abdominal pain EXAM: CT ABDOMEN AND PELVIS WITHOUT CONTRAST TECHNIQUE: Multidetector CT imaging of the abdomen and pelvis was performed following the standard protocol without IV contrast. RADIATION DOSE REDUCTION: This exam was performed according to the departmental dose-optimization program which includes automated exposure control, adjustment of the mA and/or kV according to patient size and/or use of iterative reconstruction technique. COMPARISON:  02/14/2022 FINDINGS: Lower chest: No acute abnormality. Hepatobiliary: Liver is nodular and somewhat shrunken when compared with the prior exam with increased perihepatic ascites. The gallbladder has been surgically removed. Pancreas: Unremarkable. No pancreatic ductal dilatation or surrounding inflammatory changes. Spleen: Normal in size without focal abnormality. Adrenals/Urinary Tract: Adrenal glands appear within normal limits. The kidneys show no renal calculi or obstructive changes. The bladder is well distended. Stomach/Bowel: Scattered diverticular change of the colon is noted. No diverticulitis is seen. The appendix is not discretely visualized. Stomach is within normal limits. Small bowel shows no obstructive changes. Fluid is noted throughout the abdomen surrounding the large and small bowel.  Vascular/Lymphatic: No significant vascular findings are present. No enlarged abdominal or pelvic lymph nodes. Reproductive: Uterus is stable in appearance. Left adnexal cyst measures 3.4 cm. This is slightly increased when compared with the prior exam of uncertain significance given the patient's age. Other: Considerable free fluid is noted throughout the abdomen. Changes of anasarca are noted as well. No herniation is identified. Musculoskeletal: No acute or significant osseous findings. IMPRESSION: Cirrhotic change of the liver with considerable ascites and changes of anasarca. Slight increase in left ovarian cystic lesion size when compared with the prior exam. Recommend follow-up US  in 6-12 months. Note: This recommendation does not apply to premenarchal patients and to those with increased risk (genetic, family history, elevated tumor markers or other high-risk factors) of ovarian cancer. Reference: JACR 2020 Feb; 17(2):248-254 Diverticulosis without diverticulitis. Electronically Signed   By: Oneil Devonshire M.D.   On: 07/12/2024 02:11   US  Venous Img Lower Bilateral Result Date: 07/11/2024 CLINICAL DATA:  worsening LE edema L>R EXAM: BILATERAL LOWER EXTREMITY VENOUS DOPPLER ULTRASOUND TECHNIQUE: Gray-scale sonography with graded compression, as well as color Doppler and duplex ultrasound were performed to evaluate the lower extremity deep venous systems from the level of the common femoral vein and including the common femoral, femoral, profunda femoral, popliteal and calf veins including the posterior tibial, peroneal and gastrocnemius veins when visible. The superficial great saphenous vein was also interrogated. Spectral Doppler was utilized to evaluate flow at rest and with distal augmentation maneuvers in the common femoral, femoral  and popliteal veins. COMPARISON:  None Available. FINDINGS: RIGHT LOWER EXTREMITY Common Femoral Vein: No evidence of thrombus. Normal compressibility, respiratory  phasicity and response to augmentation. Saphenofemoral Junction: No evidence of thrombus. Normal compressibility and flow on color Doppler imaging. Profunda Femoral Vein: No evidence of thrombus. Normal compressibility and flow on color Doppler imaging. Femoral Vein: No evidence of thrombus. Normal compressibility, respiratory phasicity and response to augmentation. Popliteal Vein: No evidence of thrombus. Normal compressibility, respiratory phasicity and response to augmentation. Calf Veins: No evidence of thrombus. Normal compressibility and flow on color Doppler imaging. Superficial Great Saphenous Vein: No evidence of thrombus. Normal compressibility. Other Findings:  None. LEFT LOWER EXTREMITY Common Femoral Vein: No evidence of thrombus. Normal compressibility, respiratory phasicity and response to augmentation. Saphenofemoral Junction: No evidence of thrombus. Normal compressibility and flow on color Doppler imaging. Profunda Femoral Vein: No evidence of thrombus. Normal compressibility and flow on color Doppler imaging. Femoral Vein: No evidence of thrombus. Normal compressibility, respiratory phasicity and response to augmentation. Popliteal Vein: No evidence of thrombus. Normal compressibility, respiratory phasicity and response to augmentation. Calf Veins: No evidence of thrombus. Normal compressibility and flow on color Doppler imaging. Superficial Great Saphenous Vein: No evidence of thrombus. Normal compressibility. Other Findings:  None. IMPRESSION: Negative for deep venous thrombosis within both legs. Electronically Signed   By: Rogelia Myers M.D.   On: 07/11/2024 16:36   DG Chest Portable 1 View Result Date: 07/11/2024 EXAM: 1 VIEW(S) XRAY OF THE CHEST 07/11/2024 03:22:00 PM COMPARISON: 01/25/2024 CLINICAL HISTORY: shortness of breath on exertion, history of CHF FINDINGS: LINES, TUBES AND DEVICES: Cardiac monitor over left chest. LUNGS AND PLEURA: Low lung volumes. No focal pulmonary opacity.  No pulmonary edema. No pleural effusion. No pneumothorax. HEART AND MEDIASTINUM: No acute abnormality of the cardiac and mediastinal silhouettes. BONES AND SOFT TISSUES: No acute osseous abnormality. Paucity of bowel gas in visible abdomen. IMPRESSION: 1. No acute cardiopulmonary process identified. 2. Low lung volumes Electronically signed by: Waddell Calk MD 07/11/2024 03:57 PM EDT RP Workstation: [id number]       Assessment / Plan:    #83 63 year old female admitted through the emergency room with progressive dyspnea over the past 3 weeks as well as progressive lower extremity edema and increasing abdominal girth. She does have a diagnosis of congestive heart failure but last echo showed EF of 65 to 70% earlier this year BNP 4600 on admission, no overt pulmonary edema on chest x-ray  2D echo done today, reassess degree of heart failure  Patient also has diagnosis of cirrhosis secondary to hepatitis C which appears decompensated She did not complete treatment for hep C when initially offered in 2022 secondary to side effects. Hep C RNA quant pending AFP elevated  Stable status post paracentesis yesterday, no SBP, cytology negative SAA G= 1.1 suggestive of underlying portal hypertension  MELD-Na=11  She has been on fairly aggressive diuresis with Lasix  80 mg IV every 12 since admission Creatinine on the rise today  #2 diabetes mellitus 3.  Chronic kidney disease stage III 4.  Chronic diarrhea-should eventually have colonoscopy but not until she has had significant diuresis as bowel wall edema may be playing a role. Check fecal elastase  #6 hypertensive urgency blood pressure significantly improved since admission #7 normocytic anemia  Plan; continue 2 g sodium carb modified diet Decrease Lasix  to 60 mg once daily, continue low-dose Aldactone Continue to monitor kidney function closely Patient had been started on Chronulac here, she is not encephalopathic and with  her complaint of  chronic diarrhea will stop and observe Fecal elastase, calprotectin Follow-up echo Follow-up hep C RNA quant  Will plan for MRI of the liver prior to discharge Not planning colonoscopy during this admission as she has upcoming follow-up to establish with [facility] She likely will also need to be seen by [facility] hepat[facility] for treatment of hepatitis C in setting of cirrhosis GI will continue to follow with you    Principal Problem:   Anasarca Active Problems:   Diabetes mellitus (HCC)   Acute on chronic heart failure with preserved ejection fraction (HFpEF) (HCC)   Hypertensive urgency   Cirrhosis of liver (HCC)     LOS: 1 day   Jeancarlo Leffler EsterwoodPA-C  07/13/2024, 2:37 PM  I have taken an interval history, thoroughly reviewed the chart and examined the patient. I agree with the Advanced Practitioner's note, impression and recommendations, and have recorded additional findings, impressions and recommendations below. I performed a substantive portion of this encounter (>50% time spent), including a complete performance of the medical decision making.  My additional thoughts are as follows:  Slow progress is being made with this patient's volume overload, but her creatinine is slowly rising with the aggressive diuresis.  She has chronic diarrhea with 2-3 loose stools per day, something that has been going on for well over a year.  Sounds like she had some workup at an outside GI clinic with an upper endoscopy (unknown if small bowel biopsies were taken).  She apparently had an insufficient bowel prep and had not gotten back to have a repeat colonoscopy.  Her profound hypoalbuminemia is likely due to synthetic dysfunction from her cirrhosis, though there is some protein detected in her urinalysis and it would probably be wise to do a 24-hour urine collection or protein to creatinine ratio to see if there is nephrotic range proteinuria.  She will need to reconnect  with her outpatient GI practice shortly after discharge for reevaluation of her cirrhosis, candidacy for HCV treatment and colonoscopy when her volume status is improved.  Nonspecific mild elevation of the AFP, may just be related to cirrhosis.  No mass on admission CT, but it was without IV contrast.  Prior to discharge, she needs an MRI of the liver with and without gadolinium for better evaluation. _________________  This consultation required a moderate degree of medical decision making due to the nature and complexity of the acute condition(s) being evaluated as well as the patient's medical comorbidities.  Victory LITTIE Brand III Office:[phone]

## 2024-07-13 NOTE — Plan of Care (Signed)

## 2024-07-13 NOTE — Progress Notes (Addendum)
 PROGRESS NOTE    Susan Hogan  FMW:[id number] DOB: [birth year] DOA: 07/11/2024 PCP: Samie Frederick, PA-C  63/F with history of OSA, obesity, hep C, cirrhosis, type 2 diabetes mellitus, hypertension, dyslipidemia presented to the ED with progressive lower extremity edema and abdominal distention for few weeks. - In the ER hypertensive, creatinine 1.2, agree with proBNP over 4000, troponin 21, 26, chest x-ray was unremarkable, CT abdomen pelvis noted liver cirrhosis with considerable ascites and anasarca. - Admitted, started on diuretics, underwent paracentesis, 1.4 L drained   Subjective: -Complains of allergies, breathing a little better, abdominal swelling improved  Assessment and Plan:  Decompensated liver cirrhosis, history of hep C Acute on chronic diastolic CHF Portal hypertension, ascites -Underwent large-volume paracentesis yesterday, 4.5 L drained -Continue IV Lasix  80 mg twice daily today, add Aldactone -Follow-up repeat echo, monitor BP and kidney function - On ceftriaxone  for SBP prophylaxis  Hypertensive urgency  -Proving with diuresis and volume removal, restart amlodipine    Hypokalemia -Repleted  Elevated troponin Slightly elevated.  Likely due to LV strain from volume overload  AKI on CKD 3A Baseline creatinine from May was 1.03  H/o type 2 diabetes mellitus A1c is actually low at 3.9 on 07/12/2024  Ovarian cyst CT abdomen noted slight increase in left ovarian cystic lesion size when compared with the prior exam.  Radiologist recommend follow-up US  in 6-12 months.     Diverticulosis without diverticulitis Avoid constipation   H/o migraine Used to be on Qulipta .    DVT prophylaxis:add lovenox  Code Status: Full code Family Communication: None present Disposition Plan: Home pending improvement in volume status    Objective: Vitals:   07/13/24 0447 07/13/24 0620 07/13/24 0654 07/13/24 0738  BP: (!) 161/75 (!) 172/70 (!) 157/66 (!) 160/76  Pulse:  88  81 82  Resp: 20  20 20   Temp: 98.6 F (37 C)   98.6 F (37 C)  TempSrc: Oral   Oral  SpO2: 97%  98% 99%  Weight:      Height:        Intake/Output Summary (Last 24 hours) at 07/13/2024 1009 Last data filed at 07/13/2024 1007 Gross per 24 hour  Intake 760 ml  Output 3850 ml  Net -3090 ml   Filed Weights   07/11/24 2054 07/13/24 0336  Weight: 97.3 kg 91.9 kg    Examination:  General exam: Appears calm and comfortable  Respiratory system: Clear to auscultation Cardiovascular system: S1 & S2 heard, RRR.  Abd: nondistended, soft and nontender.Normal bowel sounds heard. Central nervous system: Alert and oriented. No focal neurological deficits. Extremities: no edema Skin: No rashes Psychiatry:  Mood & affect appropriate.     Data Reviewed:   CBC: Recent Labs  Lab 07/11/24 1243 07/12/24 0242  WBC 6.3 7.6  NEUTROABS  --  5.0  HGB 10.7* 12.4  HCT 30.2* 35.9*  MCV 92.9 94.0  PLT 151 217   Basic Metabolic Panel: Recent Labs  Lab 07/11/24 1243 07/12/24 0242 07/13/24 0215  NA 144 143 142  K 3.2* 3.5 3.5  CL 111 114* 110  CO2 22 20* 24  GLUCOSE 117* 115* 92  BUN 16 15 16   CREATININE 1.22* 1.38* 1.49*  CALCIUM  8.5* 8.3* 7.9*  MG 1.6* 1.9  --    GFR: Estimated Creatinine Clearance: 37.3 mL/min (A) (by C-G formula based on SCr of 1.49 mg/dL (H)). Liver Function Tests: Recent Labs  Lab 07/11/24 1243  AST 90*  ALT 52*  ALKPHOS 67  BILITOT 0.4  PROT  4.7*  ALBUMIN 2.6*   Recent Labs  Lab 07/11/24 1243  LIPASE 36   No results for input(s): AMMONIA in the last 168 hours. Coagulation Profile: Recent Labs  Lab 07/12/24 1435  INR 1.1   Cardiac Enzymes: No results for input(s): CKTOTAL, CKMB, CKMBINDEX, TROPONINI in the last 168 hours. BNP (last 3 results) Recent Labs    07/11/24 1243  PROBNP 4,021.0*   HbA1C: Recent Labs    07/12/24 0242  HGBA1C 3.9*   CBG: Recent Labs  Lab 07/12/24 0618 07/12/24 1128 07/12/24 1530  07/12/24 2127 07/13/24 0614  GLUCAP 118* 98 103* 112* 114*   Lipid Profile: No results for input(s): CHOL, HDL, LDLCALC, TRIG, CHOLHDL, LDLDIRECT in the last 72 hours. Thyroid  Function Tests: Recent Labs    07/12/24 0242  TSH 7.952*   Anemia Panel: Recent Labs    07/12/24 0242  VITAMINB12 356  FOLATE 12.2  FERRITIN 94  TIBC 379  IRON 49  RETICCTPCT 3.0   Urine analysis:    Component Value Date/Time   COLORURINE COLORLESS (A) 07/11/2024 1630   APPEARANCEUR CLEAR 07/11/2024 1630   LABSPEC 1.005 07/11/2024 1630   PHURINE 6.0 07/11/2024 1630   GLUCOSEU NEGATIVE 07/11/2024 1630   HGBUR MODERATE (A) 07/11/2024 1630   BILIRUBINUR NEGATIVE 07/11/2024 1630   KETONESUR NEGATIVE 07/11/2024 1630   PROTEINUR 100 (A) 07/11/2024 1630   UROBILINOGEN 0.2 05/26/2013 0048   NITRITE NEGATIVE 07/11/2024 1630   LEUKOCYTESUR NEGATIVE 07/11/2024 1630   Sepsis Labs: @LABRCNTIP (procalcitonin:4,lacticidven:4)  ) Recent Results (from the past 240 hours)  Gram stain     Status: None   Collection Time: 07/12/24 10:31 AM   Specimen: Abdomen; Peritoneal Fluid  Result Value Ref Range Status   Specimen Description PERITONEAL  Final   Special Requests ABDOMEN  Final   Gram Stain   Final    NO WBC SEEN NO ORGANISMS SEEN Performed at [facility], [address], [place], [place] [address]    Report Status 07/12/2024 FINAL  Final     Radiology Studies: IR Paracentesis Result Date: 07/12/2024 INDICATION: 63 year old female with a history of liver cirrhosis secondary to hepatitis C. Imaging notable for abdominal ascites. Request for diagnostic and therapeutic paracentesis. EXAM: ULTRASOUND GUIDED DIAGNOSTIC AND THERAPEUTIC, LEFT-SIDED PARACENTESIS MEDICATIONS: 1% lidocaine , 9 mL. COMPLICATIONS: None immediate. PROCEDURE: Informed written consent was obtained from the patient after a discussion of the risks, benefits and alternatives to treatment. A timeout was performed prior  to the initiation of the procedure. Initial ultrasound scanning demonstrates a moderate amount of ascites within the left lower abdominal quadrant. The left lower abdomen was prepped and draped in the usual sterile fashion. 1% lidocaine  was used for local anesthesia. Following this, a 19 gauge, 7-cm, Yueh catheter was introduced. An ultrasound image was saved for documentation purposes. The paracentesis was performed. The catheter was removed and a dressing was applied. The patient tolerated the procedure well without immediate post procedural complication. FINDINGS: A total of approximately 4.5L of cloudy yellow fluid was removed. Samples were sent to the laboratory as requested by the clinical team. IMPRESSION: Successful ultrasound-guided paracentesis yielding 4.5 liters of peritoneal fluid. PLAN: If the patient eventually requires >/=2 paracenteses in a 30 day period, candidacy for formal evaluation by the [facility] will be assessed. Procedure performed by: Sherrilee Bal, PA-C under the supervision of Dr. ONEIDA Specking Electronically Signed   By: CHRISTELLA.  Shick M.D.   On: 07/12/2024 10:41   CT ABDOMEN  PELVIS WO CONTRAST Result Date: 07/12/2024 CLINICAL DATA:  Acute abdominal pain EXAM: CT ABDOMEN AND PELVIS WITHOUT CONTRAST TECHNIQUE: Multidetector CT imaging of the abdomen and pelvis was performed following the standard protocol without IV contrast. RADIATION DOSE REDUCTION: This exam was performed according to the departmental dose-optimization program which includes automated exposure control, adjustment of the mA and/or kV according to patient size and/or use of iterative reconstruction technique. COMPARISON:  02/14/2022 FINDINGS: Lower chest: No acute abnormality. Hepatobiliary: Liver is nodular and somewhat shrunken when compared with the prior exam with increased perihepatic ascites. The gallbladder has been surgically removed. Pancreas: Unremarkable. No  pancreatic ductal dilatation or surrounding inflammatory changes. Spleen: Normal in size without focal abnormality. Adrenals/Urinary Tract: Adrenal glands appear within normal limits. The kidneys show no renal calculi or obstructive changes. The bladder is well distended. Stomach/Bowel: Scattered diverticular change of the colon is noted. No diverticulitis is seen. The appendix is not discretely visualized. Stomach is within normal limits. Small bowel shows no obstructive changes. Fluid is noted throughout the abdomen surrounding the large and small bowel. Vascular/Lymphatic: No significant vascular findings are present. No enlarged abdominal or pelvic lymph nodes. Reproductive: Uterus is stable in appearance. Left adnexal cyst measures 3.4 cm. This is slightly increased when compared with the prior exam of uncertain significance given the patient's age. Other: Considerable free fluid is noted throughout the abdomen. Changes of anasarca are noted as well. No herniation is identified. Musculoskeletal: No acute or significant osseous findings. IMPRESSION: Cirrhotic change of the liver with considerable ascites and changes of anasarca. Slight increase in left ovarian cystic lesion size when compared with the prior exam. Recommend follow-up US  in 6-12 months. Note: This recommendation does not apply to premenarchal patients and to those with increased risk (genetic, family history, elevated tumor markers or other high-risk factors) of ovarian cancer. Reference: JACR 2020 Feb; 17(2):248-254 Diverticulosis without diverticulitis. Electronically Signed   By: Oneil Devonshire M.D.   On: 07/12/2024 02:11   US  Venous Img Lower Bilateral Result Date: 07/11/2024 CLINICAL DATA:  worsening LE edema L>R EXAM: BILATERAL LOWER EXTREMITY VENOUS DOPPLER ULTRASOUND TECHNIQUE: Gray-scale sonography with graded compression, as well as color Doppler and duplex ultrasound were performed to evaluate the lower extremity deep venous systems  from the level of the common femoral vein and including the common femoral, femoral, profunda femoral, popliteal and calf veins including the posterior tibial, peroneal and gastrocnemius veins when visible. The superficial great saphenous vein was also interrogated. Spectral Doppler was utilized to evaluate flow at rest and with distal augmentation maneuvers in the common femoral, femoral and popliteal veins. COMPARISON:  None Available. FINDINGS: RIGHT LOWER EXTREMITY Common Femoral Vein: No evidence of thrombus. Normal compressibility, respiratory phasicity and response to augmentation. Saphenofemoral Junction: No evidence of thrombus. Normal compressibility and flow on color Doppler imaging. Profunda Femoral Vein: No evidence of thrombus. Normal compressibility and flow on color Doppler imaging. Femoral Vein: No evidence of thrombus. Normal compressibility, respiratory phasicity and response to augmentation. Popliteal Vein: No evidence of thrombus. Normal compressibility, respiratory phasicity and response to augmentation. Calf Veins: No evidence of thrombus. Normal compressibility and flow on color Doppler imaging. Superficial Great Saphenous Vein: No evidence of thrombus. Normal compressibility. Other Findings:  None. LEFT LOWER EXTREMITY Common Femoral Vein: No evidence of thrombus. Normal compressibility, respiratory phasicity and response to augmentation. Saphenofemoral Junction: No evidence of thrombus. Normal compressibility and flow on color Doppler imaging. Profunda Femoral Vein: No evidence of thrombus. Normal compressibility and flow on color  Doppler imaging. Femoral Vein: No evidence of thrombus. Normal compressibility, respiratory phasicity and response to augmentation. Popliteal Vein: No evidence of thrombus. Normal compressibility, respiratory phasicity and response to augmentation. Calf Veins: No evidence of thrombus. Normal compressibility and flow on color Doppler imaging. Superficial Great  Saphenous Vein: No evidence of thrombus. Normal compressibility. Other Findings:  None. IMPRESSION: Negative for deep venous thrombosis within both legs. Electronically Signed   By: Rogelia Myers M.D.   On: 07/11/2024 16:36   DG Chest Portable 1 View Result Date: 07/11/2024 EXAM: 1 VIEW(S) XRAY OF THE CHEST 07/11/2024 03:22:00 PM COMPARISON: 01/25/2024 CLINICAL HISTORY: shortness of breath on exertion, history of CHF FINDINGS: LINES, TUBES AND DEVICES: Cardiac monitor over left chest. LUNGS AND PLEURA: Low lung volumes. No focal pulmonary opacity. No pulmonary edema. No pleural effusion. No pneumothorax. HEART AND MEDIASTINUM: No acute abnormality of the cardiac and mediastinal silhouettes. BONES AND SOFT TISSUES: No acute osseous abnormality. Paucity of bowel gas in visible abdomen. IMPRESSION: 1. No acute cardiopulmonary process identified. 2. Low lung volumes Electronically signed by: Waddell Calk MD 07/11/2024 03:57 PM EDT RP Workstation: [id number]     Scheduled Meds:  azelastine   1 spray Each Nare BID   fluticasone   1 spray Each Nare Daily   furosemide   80 mg Intravenous BID   insulin  aspart  0-9 Units Subcutaneous TID WC   lactulose  20 g Oral BID   spironolactone  25 mg Oral Daily   Continuous Infusions:  albumin human     cefTRIAXone  (ROCEPHIN )  IV Stopped (07/12/24 0940)     LOS: 1 day    Time spent:    Sigurd Pac, MD [facility]   07/13/2024, 10:09 AM

## 2024-07-13 NOTE — Progress Notes (Signed)
 Orthopedic Tech Progress Note Patient Details:  Susan Hogan 12-22-1960 [id number]  [name] will need to be replaced Monday, 10/27.  Ortho Devices Type of Ortho Device: Radio broadcast assistant Ortho Device/Splint Location: BLE Ortho Device/Splint Interventions: Ordered, Application, Adjustment   Post Interventions Patient Tolerated: Well Instructions Provided: Care of device, Adjustment of device  Malakye Nolden Ronal Brasil 07/13/2024, 6:52 PM

## 2024-07-13 NOTE — Plan of Care (Signed)
  Problem: Clinical Measurements: Goal: Will remain free from infection Outcome: Progressing Goal: Respiratory complications will improve Outcome: Progressing Goal: Cardiovascular complication will be avoided Outcome: Progressing   Problem: Elimination: Goal: Will not experience complications related to bowel motility Outcome: Progressing   Problem: Safety: Goal: Ability to remain free from injury will improve Outcome: Progressing

## 2024-07-13 NOTE — Discharge Instructions (Addendum)
 [employer] assistance programs Crisis assistance programs  -[facility] Arts development officer. If you are experiencing homelessness in [place], [place] , your first point of contact should be Pensions consultant. You can reach Coordinated Entry by calling ([phone]) (623) 165-9669 or by emailing [email or web address] [email or web address].  Community access points: [employer] 351-287-[address], [place]) every Tuesday from 9am-10am. [facility] ([address]. [address], [place]) every Wednesday from 8am-9am.   -[place] Coordinated Re-entry Daniel Mcalpine: Dial 211 and request. Offers referrals to homeless shelters in the area.    -The [employer] 9066409169) offers several services to local families, as funding allows. The Emergency Assistance Program (EAP), which they administer, provides household goods, free food, clothing, and financial aid to people in need in the [place]  area. The EAP program does have some qualification, and counselors will interview clients for financial assistance by written referral only. Referrals need to be made by the Department of Social Services or by other EAP approved human services agencies or charities in the area.  -[facility] of [employer], which can be reached at [phone], offers emergency assistance programs for those in need of help, such as food, rent assistance, a soup kitchen, shelter, and clothing. They are based in [facility] [place]  but provide a number of services to those that qualify for assistance.   [facility] Department of Social Services may be able to offer temporary financial assistance and cash grants for paying rent and utilities, Help may be provided for local county residents who may be experiencing personal crisis when other resources, including government programs, are not available. Call [phone]  -[employer] is a [employer] agency, The organization can offer emergency assistance for paying rent, [employer], utilities, food, household products and furniture. They offer extensive emergency and transitional housing for families, children and single women, and also run a Boy's and [employer]. Thrift Shops, Secondary school teacher, and other aid offered too. [address], [place], [place]  [address], ([phone]  -[place] Low Income Energy Assistance Program -- This is offered for [facility] families. The federal government created [employer] Program provides a one-time cash grant payment to help eligible low-income families pay their electric and heating bills. [address] [place], [place]  [address], 360-059-5247  -[place] Emergency Assistance -- A program offers emergency utility and rent funds for greater [employer] area residents. The program can also provide counseling and referrals to charities and government programs. Also provides food and a free meal program that serves lunch Mondays - Saturdays and dinner seven days per week to individuals in the community. [address] [place], [place]  [address], [phone]  -[employer] - Offers affordable apartment and housing communities across      [place] and [place]. The low income and seniors can access public housing, rental assistance to qualified applicants, and apply for the section 8 rent subsidy program. Other programs include Chiropractor and Engineer, maintenance. [address], [place], [place]  [address], dial [phone].  -[facility] provides transitional housing to veterans and the disabled. Clients will also access other services too, including assistance in applying for Disability, life skills classes, case management, and assistance in finding permanent housing. [address] [place], [place]  [place] [address], call [phone]  -[place] Transitional Housing through [facility] is for people who were just  evicted or that are formerly homeless. The non-profit will also help then gain self-sufficiency, find a home or apartment to live in, and also provides information on rent assistance when needed. Phone [phone]  -The [employer] Tri[facility]  [place] [employer] Program helps low income, elderly, or disabled residents in seven counties in the [place]  ([place], [place], [place], [place], [place], [place], [place], and [place]) save energy and reduce their utility bills by improving energy efficiency. Phone [phone].  -[employer] is located in the [place] [facility] in the [employer], [address], [place], [place] [address]. Parking is in the rear of the building. Phone: [phone]   General Email: info@gsohc .org  [facility] provides free housing counseling assistance in locating affordable rental housing or housing with support services for families and individuals in crisis and the chronically homeless. We provide potential resources for other housing needs like utilities. Our trained counselors also work with clients on budgeting and financial literacy in effort to empower them to take control of their financial situations. [employer] collaborates with homeless service providers and other stakeholders as part of the [employer] COC (Continuum of Care). The (COC) is a regional/local planning body that coordinates housing and services funding for homeless families and individuals. The role of [facility] in the COC is through housing counseling to work with people we serve on diversion strategies for those that are at imminent risk of becoming homeless. We also work with the Coordinated Assessment/Entry Specialist who attempts to find temporary solutions and/or connects the people  to Housing First, Rapid Re-housing or transitional housing programs. Our Homelessness Prevention Housing Counselors meet with clients on business days (Monday-Fridays, except scheduled holidays) from 8:30 am to 4:30 pm.   Mental Health Resources 24 Hour Mental Health Crisis Services *[facility] - walk in urgent care 24/7 for all insurances/uninsured - ([address] [place], [place] [address]) [phone]/[phone] *[facility] Mobile Crisis (860)878-5233 *[place]  National Suicide Hotline (431)050-2859 *Family Services of the [employer] - domestic violence, rape, victim assistance (407) 217-8822 *[facility] [employer] 256-757-2147 8-5pm or 937-656-6496 after hours  Other Mental Health (Therapy/Counseling/Psychiatry) Services -[facility] ([address] [place], [place] [address]) [phone] - Medicaid, uninsured   -[facility] ([address], [place], [place] [phone] - Medicare   -[facility] ([address], [place], [place] [address]) [phone]  -[facility] ([address], [place], [place] [address]) [phone] - Medicare  -[facility] ([address] [place], [place] [phone] - Medicaid  -[facility] the [facility] ([address]  [place], [place], [place]) [phone] - Medicaid, sliding scale   -[facility] ([address], [place], [place]) [phone] - Medicaid   -[facility] ([address] [place], [place], [place] [phone]   -[facility] [phone], [place], [place]) [phone] - uninsured and underinsured patients   -[facility] ([address], [place], [place] [address]) [phone] - Medicare and Medicaid   -Psychotherapeutic Services/ [facility]T Services ([address] [place], [place]  [phone] - Medicaid   -[facility] ([place] and [place] locations) [phone] - Medicare and Medicaid   -Ima Anon, MD ([address] [place], [place] [address]) [phone]  [facility] ([address] [place], [place] [address]) [phone] - subsidized costs available  -[facility] ([address], [place], [place]) [phone]  -[facility] ([address], [place],  [place] [address]) [phone]  [facility] [address] [place] Joylene W[address], [place] [address]) [phone]  [facility] [phone] [place], [place], [place] [address]) [phone]  [facility] ([address] [place], [place], [place]) [phone]  -[facility] ([address] [place], [place], [place]) [phone]  -[facility] ([address] [place], [place], [place] [address]) [phone]  -[address] [place], [place] [address]) [phone]  -[facility] ([address] [place], [place], [place]) [phone] - Medicaid and Medicare, payment plans available  -[facility] ([address] [place], [place]) [phone]  -[address] Counseling ([address], [place], [place] [phone] -   -[facility] Social Emotional Learning ([address], [place], [place] [phone] - Medicaid  -[facility] ([address] [place], [place], [place] [phone] - Medicaid  -[facility] ([address], [place], [place] [phone] - Medicare  -[facility] ([address] [place], [address]rd Floor, [place], [place] [phone]- Medicaid, income based sliding scale   -[facility] [phone] Clover [place], [place], [place] [address]) [phone] - Medicaid  -[facility] [place] ([address] [place], [place]) [phone]   [facility]  Mental Illness - [facility] [phone]; [place] Line [phone]    -Medication Assistance Program serves as a link between pharmaceutical companies and patients to provide low cost or free prescription medications. This service is available for residents who meet certain income restrictions and have no insurance coverage. PLEASE CALL [phone] [place]) OR ([phone] ([place])  -One Step Further: Materials engineer, [employer] Support & Nutrition Program, [employer]. Call ([phone]/ [phone].  Food pantry and assistance -[facility]-Food Bank: [address], [place] [address]. Phone [phone]  -[facility] Food Pantry: [address] [place], [place] [address]. [phone].  -Missionary Ministry: has the purpose of visiting the sick and shut-ins and provide for needs in the surrounding communities. Call [phone]. Email: [email or web address] This program provides: Food box for seniors, Financial assistance, Food to meet basic nutritional needs.  -Meals on Wheels with Senior Resources: [facility] residents age 4 and over who are homebound and unable to obtain and prepare a nutritious meal for themselves are eligible for this service. There may be a waiting list in certain parts of [address] [facility] if the route in that area is full. If you are in [facility] and Ages call [phone] to register. For all other areas call [phone] to register.  -Greater Dietitian: [email or web address]://[email or web address]  TRANSPORTATION: -[employer] Department of Health: Call [facility] and [employer] at [phone] for details. AttractionGuides.es  -Access [place]: Access [place] is the [employer]'s shared-ride transportation service for eligible riders who have a disability that prevents  them from riding the fixed route bus. Call [phone]. Access [place] riders must pay a fare of $1.50 per trip, or may purchase a 10-ride punch card for $14.00 ($1.40 per ride) or a 40-ride punch card for $48.00 ($1.20 per ride).  -The Shepherd's WHEELS rideshare transportation service is provided for senior citizens (60+) who live independently within [place] city limits and are unable to drive or have limited access to transportation. Call [phone] to schedule an appointment.  -[place] [facility]: For Medicare or Medicaid recipients call [phone]?[facility]RA Ambulance, wheelchair fleeta, and ambulatory quotes available.   FLEEING VIOLENCE: -Family Services of the [place]- 24/7 Crisis line (212)596-8085) -[facility]: (239)013-2787) 641-SAFE (281)[phone])  [place] 2-1-1 is another useful way to locate resources in the community. Visit ShedSizes.ch to find service information online. If you need additional assistance, 2-1-1 Referral Specialists are available 24 hours a day, every day by dialing 2-1-1 or [phone] from any phone. The call is free, confidential, and available in any language.  Affordable Housing Search http://www.nchousingsearch.[facility])   M-F 8a-3p [address]  [place], [place] [address] [phone] Services include: laundry, barbering, support groups, case management, phone & computer access, showers, AA/NA mtgs, mental health/substance abuse nurse, job skills class, disability information, VA assistance, spiritual classes, etc. Winter Shelter available when temperatures are less than 32 degrees.   HOMELESS SHELTERS [facility] at [facility]- Call [phone] ext. [phone] or ext. [phone]. Located at [address], [place], [place] [address]  [facility]- Call [phone]. Located at [address] [place] [address].  [facility] [employer]. Call  [phone]. Office located at [address], [place] [address].  [facility] through [place] [phone].  [facility]- Call [phone]. Located at [address] [place], [place], [place] [address].  Room at the Inn-For Pregnant mothers. Call [phone]. Located at [address] [place], [address].  [facility]-For men in [place]. Call [phone]. [facility]-Shelter in [place]. Call [phone].  Home of [employer] for [employer] [phone]. Office located at [address], [place], [address].  [employer] be agreeable to help with chores. Call [phone] ext. [phone].  Men's: [address] [place], [place] [address]. Women's: [facility]  [address] [place], [place] [address]  Crisis Services [facility] Mobile Crisis Management- [phone]  [facility] [address] [place], [place] [address]. Phone: [phone]

## 2024-07-14 DIAGNOSIS — K7460 Unspecified cirrhosis of liver: Secondary | ICD-10-CM | POA: Diagnosis not present

## 2024-07-14 DIAGNOSIS — K529 Noninfective gastroenteritis and colitis, unspecified: Secondary | ICD-10-CM | POA: Diagnosis not present

## 2024-07-14 DIAGNOSIS — B182 Chronic viral hepatitis C: Secondary | ICD-10-CM | POA: Diagnosis not present

## 2024-07-14 DIAGNOSIS — R188 Other ascites: Secondary | ICD-10-CM | POA: Diagnosis not present

## 2024-07-14 LAB — BASIC METABOLIC PANEL WITH GFR
Anion gap: 8 (ref 5–15)
BUN: 16 mg/dL (ref 8–23)
CO2: 25 mmol/L (ref 22–32)
Calcium: 7.8 mg/dL — ABNORMAL LOW (ref 8.9–10.3)
Chloride: 108 mmol/L (ref 98–111)
Creatinine, Ser: 1.53 mg/dL — ABNORMAL HIGH (ref 0.44–1.00)
GFR, Estimated: 38 mL/min — ABNORMAL LOW (ref >60)
Glucose, Bld: 87 mg/dL (ref 70–99)
Potassium: 3.4 mmol/L — ABNORMAL LOW (ref 3.5–5.1)
Sodium: 141 mmol/L (ref 135–145)

## 2024-07-14 LAB — HEPATIC FUNCTION PANEL
ALT: 31 U/L (ref 0–44)
AST: 57 U/L — ABNORMAL HIGH (ref 15–41)
Albumin: 2.4 g/dL — ABNORMAL LOW (ref 3.5–5.0)
Alkaline Phosphatase: 37 U/L — ABNORMAL LOW (ref 38–126)
Bilirubin, Direct: <0.1 mg/dL (ref 0.0–0.2)
Total Bilirubin: 0.5 mg/dL (ref 0.0–1.2)
Total Protein: 4 g/dL — ABNORMAL LOW (ref 6.5–8.1)

## 2024-07-14 LAB — HEPATITIS C GENOTYPE

## 2024-07-14 LAB — HCV RNA QUANT RFLX ULTRA OR GENOTYP
HCV RNA Qnt(log copy/mL): 5.672 {Log_IU}/mL
HepC Qn: 470000 [IU]/mL

## 2024-07-14 LAB — GLUCOSE, CAPILLARY
Glucose-Capillary: 81 mg/dL (ref 70–99)
Glucose-Capillary: 91 mg/dL (ref 70–99)
Glucose-Capillary: 83 mg/dL (ref 70–99)
Glucose-Capillary: 95 mg/dL (ref 70–99)

## 2024-07-14 MED ORDER — FUROSEMIDE 10 MG/ML IJ SOLN
60.0000 mg | Freq: Two times a day (BID) | INTRAMUSCULAR | Status: DC
  Administered 2024-07-14 – 2024-07-16 (×6): 60 mg via INTRAVENOUS
  Filled 2024-07-14 (×8): qty 6

## 2024-07-14 MED ORDER — POTASSIUM CHLORIDE CRYS ER 20 MEQ PO TBCR
40.0000 meq | EXTENDED_RELEASE_TABLET | Freq: Two times a day (BID) | ORAL | Status: AC
  Administered 2024-07-14 (×2): 40 meq via ORAL
  Filled 2024-07-14 (×2): qty 2

## 2024-07-14 MED ORDER — ACETAMINOPHEN 325 MG PO TABS
650.0000 mg | ORAL_TABLET | Freq: Four times a day (QID) | ORAL | Status: DC | PRN
  Administered 2024-07-14 – 2024-07-16 (×2): 650 mg via ORAL
  Filled 2024-07-14 (×2): qty 2

## 2024-07-14 MED ORDER — BUTALBITAL-APAP-CAFFEINE 50-325-40 MG PO TABS
1.0000 | ORAL_TABLET | Freq: Once | ORAL | Status: AC
  Administered 2024-07-14: 1 via ORAL
  Filled 2024-07-14: qty 1

## 2024-07-14 NOTE — Progress Notes (Addendum)
 PROGRESS NOTE    Susan Hogan  FMW:[id number] DOB: [birth year] DOA: 07/11/2024 PCP: Samie Frederick, PA-C  63/F with history of OSA, obesity, hep C, cirrhosis, type 2 diabetes mellitus, hypertension, dyslipidemia presented to the ED with progressive lower extremity edema and abdominal distention for few weeks. - In the ER hypertensive, creatinine 1.2, agree with proBNP over 4000, troponin 21, 26, chest x-ray was unremarkable, CT abdomen pelvis noted liver cirrhosis with considerable ascites and anasarca. - Admitted, started on diuretics, underwent paracentesis, 1.4 L drained   Subjective: - Has some sinus congestion, swelling and breathing slowly improving  Assessment and Plan:  Decompensated liver cirrhosis, history of hep C Acute on chronic diastolic CHF Portal hypertension, ascites -Underwent large-volume paracentesis, 4.5 L drained - Remains significantly volume overloaded, increase Lasix  dose, continue Aldactone, Unna boots -2D echo noted EF of 60-65, grade 1 DD, normal RV - On ceftriaxone  for SBP prophylaxis - GI following, recommended liver MRI prior to discharge and follow-up with [facility]  Hypertensive urgency  - Reviewing with diuresis, continue amlodipine    Hypokalemia -Repleted  Elevated troponin Slightly elevated.  Likely due to LV strain from volume overload  AKI on CKD 3A Baseline creatinine from May was 1.03  H/o type 2 diabetes mellitus A1c is actually low at 3.9 on 07/12/2024  Ovarian cyst CT abdomen noted slight increase in left ovarian cystic lesion size when compared with the prior exam.  Radiologist recommend follow-up US  in 6-12 months.     Diverticulosis without diverticulitis Avoid constipation   H/o migraine Used to be on Qulipta .    DVT prophylaxis:add lovenox  Code Status: Full code Family Communication: None present Disposition Plan: Home pending improvement in volume status    Objective: Vitals:   07/13/24 2106 07/14/24  0549 07/14/24 0723 07/14/24 0849  BP: (!) 155/73 (!) 189/101 (!) 159/72 (!) 159/72  Pulse: 86 70 85   Resp: 18 18 19    Temp: 98.1 F (36.7 C) 97.6 F (36.4 C) 97.7 F (36.5 C)   TempSrc: Oral Oral Oral   SpO2: 98% 99% 97%   Weight:  89.5 kg    Height:        Intake/Output Summary (Last 24 hours) at 07/14/2024 1006 Last data filed at 07/14/2024 0500 Gross per 24 hour  Intake 380.07 ml  Output 950 ml  Net -569.93 ml   Filed Weights   07/11/24 2054 07/13/24 0336 07/14/24 0549  Weight: 97.3 kg 91.9 kg 89.5 kg    Examination:  General exam: Appears calm and comfortable  Respiratory system: Decreased breath sounds at the bases Cardiovascular system: S1 & S2 heard, RRR.  Abd: Distended, soft, nontender Central nervous system: Alert and oriented. No focal neurological deficits. Extremities: 2+ edema, Unna boots on Skin: No rashes Psychiatry:  Mood & affect appropriate.     Data Reviewed:   CBC: Recent Labs  Lab 07/11/24 1243 07/12/24 0242  WBC 6.3 7.6  NEUTROABS  --  5.0  HGB 10.7* 12.4  HCT 30.2* 35.9*  MCV 92.9 94.0  PLT 151 217   Basic Metabolic Panel: Recent Labs  Lab 07/11/24 1243 07/12/24 0242 07/13/24 0215 07/14/24 0226  NA 144 143 142 141  K 3.2* 3.5 3.5 3.4*  CL 111 114* 110 108  CO2 22 20* 24 25  GLUCOSE 117* 115* 92 87  BUN 16 15 16 16   CREATININE 1.22* 1.38* 1.49* 1.53*  CALCIUM  8.5* 8.3* 7.9* 7.8*  MG 1.6* 1.9  --   --    GFR:  Estimated Creatinine Clearance: 35.7 mL/min (A) (by C-G formula based on SCr of 1.53 mg/dL (H)). Liver Function Tests: Recent Labs  Lab 07/11/24 1243 07/14/24 0226  AST 90* 57*  ALT 52* 31  ALKPHOS 67 37*  BILITOT 0.4 0.5  PROT 4.7* 4.0*  ALBUMIN 2.6* 2.4*   Recent Labs  Lab 07/11/24 1243  LIPASE 36   No results for input(s): AMMONIA in the last 168 hours. Coagulation Profile: Recent Labs  Lab 07/12/24 1435  INR 1.1   Cardiac Enzymes: No results for input(s): CKTOTAL, CKMB, CKMBINDEX,  TROPONINI in the last 168 hours. BNP (last 3 results) Recent Labs    07/11/24 1243  PROBNP 4,021.0*   HbA1C: Recent Labs    07/12/24 0242  HGBA1C 3.9*   CBG: Recent Labs  Lab 07/13/24 0614 07/13/24 1137 07/13/24 1601 07/13/24 2104 07/14/24 0547  GLUCAP 114* 122* 121* 107* 81   Lipid Profile: No results for input(s): CHOL, HDL, LDLCALC, TRIG, CHOLHDL, LDLDIRECT in the last 72 hours. Thyroid  Function Tests: Recent Labs    07/12/24 0242  TSH 7.952*   Anemia Panel: Recent Labs    07/12/24 0242  VITAMINB12 356  FOLATE 12.2  FERRITIN 94  TIBC 379  IRON 49  RETICCTPCT 3.0   Urine analysis:    Component Value Date/Time   COLORURINE COLORLESS (A) 07/11/2024 1630   APPEARANCEUR CLEAR 07/11/2024 1630   LABSPEC 1.005 07/11/2024 1630   PHURINE 6.0 07/11/2024 1630   GLUCOSEU NEGATIVE 07/11/2024 1630   HGBUR MODERATE (A) 07/11/2024 1630   BILIRUBINUR NEGATIVE 07/11/2024 1630   KETONESUR NEGATIVE 07/11/2024 1630   PROTEINUR 100 (A) 07/11/2024 1630   UROBILINOGEN 0.2 05/26/2013 0048   NITRITE NEGATIVE 07/11/2024 1630   LEUKOCYTESUR NEGATIVE 07/11/2024 1630   Sepsis Labs: @LABRCNTIP (procalcitonin:4,lacticidven:4)  ) Recent Results (from the past 240 hours)  Gram stain     Status: None   Collection Time: 07/12/24 10:31 AM   Specimen: Abdomen; Peritoneal Fluid  Result Value Ref Range Status   Specimen Description PERITONEAL  Final   Special Requests ABDOMEN  Final   Gram Stain   Final    NO WBC SEEN NO ORGANISMS SEEN Performed at [facility], [address], [place], [place] [address]    Report Status 07/12/2024 FINAL  Final     Radiology Studies: ECHOCARDIOGRAM COMPLETE Result Date: 07/13/2024    ECHOCARDIOGRAM REPORT   Patient Name:   Susan Hogan Date of Exam: 07/13/2024 Medical Rec #:  [id number]      Height:       59.0 in Accession #:    [id number]     Weight:       202.6 lb Date of Birth:  25-Oct-1960      BSA:          1.855 m  Patient Age:    63 years       BP:           160/76 mmHg Patient Gender: F              HR:           74 bpm. Exam Location:  Inpatient Procedure: 2D Echo and Strain Analysis (Both Spectral and Color Flow Doppler            were utilized during procedure). Indications:    CHF  History:        Patient has prior history of Echocardiogram examinations.  Sonographer:    Charmaine Gaskins Referring Phys: [phone]  Dionta Larke IMPRESSIONS  1. Left ventricular ejection fraction, by estimation, is 60 to 65%. The left ventricle has normal function. The left ventricle has no regional wall motion abnormalities. There is moderate concentric left ventricular hypertrophy. Left ventricular diastolic parameters are consistent with Grade I diastolic dysfunction (impaired relaxation). The average left ventricular global longitudinal strain is -17.1 %. The global longitudinal strain is normal.  2. Right ventricular systolic function is normal. The right ventricular size is normal. There is normal pulmonary artery systolic pressure. The estimated right ventricular systolic pressure is 30.7 mmHg.  3. Left atrial size was mildly dilated.  4. The mitral valve is normal in structure. Trivial mitral valve regurgitation. No evidence of mitral stenosis.  5. The aortic valve is tricuspid. There is mild calcification of the aortic valve. Aortic valve regurgitation is not visualized. Aortic valve sclerosis/calcification is present, without any evidence of aortic stenosis.  6. The inferior vena cava is normal in size with greater than 50% respiratory variability, suggesting right atrial pressure of 3 mmHg. FINDINGS  Left Ventricle: Left ventricular ejection fraction, by estimation, is 60 to 65%. The left ventricle has normal function. The left ventricle has no regional wall motion abnormalities. The average left ventricular global longitudinal strain is -17.1 %. Strain was performed and the global longitudinal strain is normal. The left ventricular  internal cavity size was normal in size. There is moderate concentric left ventricular hypertrophy. Left ventricular diastolic parameters are consistent with Grade I diastolic dysfunction (impaired relaxation). Right Ventricle: The right ventricular size is normal. No increase in right ventricular wall thickness. Right ventricular systolic function is normal. There is normal pulmonary artery systolic pressure. The tricuspid regurgitant velocity is 2.63 m/s, and  with an assumed right atrial pressure of 3 mmHg, the estimated right ventricular systolic pressure is 30.7 mmHg. Left [facility]: Left atrial size was mildly dilated. Right [facility]: Right atrial size was normal in size. Pericardium: There is no evidence of pericardial effusion. Mitral Valve: The mitral valve is normal in structure. Trivial mitral valve regurgitation. No evidence of mitral valve stenosis. Tricuspid Valve: The tricuspid valve is normal in structure. Tricuspid valve regurgitation is trivial. No evidence of tricuspid stenosis. Aortic Valve: The aortic valve is tricuspid. There is mild calcification of the aortic valve. Aortic valve regurgitation is not visualized. Aortic valve sclerosis/calcification is present, without any evidence of aortic stenosis. Pulmonic Valve: The pulmonic valve was normal in structure. Pulmonic valve regurgitation is not visualized. No evidence of pulmonic stenosis. Aorta: The aortic root is normal in size and structure. Venous: The inferior vena cava is normal in size with greater than 50% respiratory variability, suggesting right atrial pressure of 3 mmHg. IAS/Shunts: No atrial level shunt detected by color flow Doppler.  LEFT VENTRICLE PLAX 2D LVIDd:         4.30 cm   Diastology LVIDs:         2.70 cm   LV e' medial:    5.22 cm/s LV PW:         1.40 cm   LV E/e' medial:  15.4 LV IVS:        1.40 cm   LV e' lateral:   8.05 cm/s LVOT diam:     2.00 cm   LV E/e' lateral: 10.0 LVOT Area:     3.14 cm LV IVRT:       134 msec   2D Longitudinal Strain  2D Strain GLS Avg:     -17.1 % RIGHT VENTRICLE RV Basal diam:  2.10 cm     PULMONARY VEINS RV Mid diam:    2.00 cm     Diastolic Velocity: 39.10 cm/s RV S prime:     14.00 cm/s  S/D Velocity:       1.70 RVSP:           30.7 mmHg   Systolic Velocity:  68.40 cm/s LEFT ATRIUM             Index        RIGHT ATRIUM           Index LA diam:        3.10 cm 1.67 cm/m   RA Pressure: 3.00 mmHg LA Vol (A2C):   72.0 ml 38.82 ml/m  RA Area:     8.94 cm LA Vol (A4C):   57.1 ml 30.79 ml/m  RA Volume:   14.60 ml  7.87 ml/m LA Biplane Vol: 66.6 ml 35.91 ml/m   AORTA Ao Root diam: 2.90 cm Ao Asc diam:  3.10 cm MITRAL VALVE                TRICUSPID VALVE MV Area (PHT): 3.33 cm     TR Peak grad:   27.7 mmHg MV Decel Time: 228 msec     TR Vmax:        263.00 cm/s MV E velocity: 80.50 cm/s   Estimated RAP:  3.00 mmHg MV A velocity: 113.00 cm/s  RVSP:           30.7 mmHg MV E/A ratio:  0.71                             SHUNTS                             Systemic Diam: 2.00 cm Toribio Fuel MD Electronically signed by Toribio Fuel MD Signature Date/Time: 07/13/2024/7:35:06 PM    Final    IR Paracentesis Result Date: 07/12/2024 INDICATION: 63 year old female with a history of liver cirrhosis secondary to hepatitis C. Imaging notable for abdominal ascites. Request for diagnostic and therapeutic paracentesis. EXAM: ULTRASOUND GUIDED DIAGNOSTIC AND THERAPEUTIC, LEFT-SIDED PARACENTESIS MEDICATIONS: 1% lidocaine , 9 mL. COMPLICATIONS: None immediate. PROCEDURE: Informed written consent was obtained from the patient after a discussion of the risks, benefits and alternatives to treatment. A timeout was performed prior to the initiation of the procedure. Initial ultrasound scanning demonstrates a moderate amount of ascites within the left lower abdominal quadrant. The left lower abdomen was prepped and draped in the usual sterile fashion. 1% lidocaine  was used for local anesthesia.  Following this, a 19 gauge, 7-cm, Yueh catheter was introduced. An ultrasound image was saved for documentation purposes. The paracentesis was performed. The catheter was removed and a dressing was applied. The patient tolerated the procedure well without immediate post procedural complication. FINDINGS: A total of approximately 4.5L of cloudy yellow fluid was removed. Samples were sent to the laboratory as requested by the clinical team. IMPRESSION: Successful ultrasound-guided paracentesis yielding 4.5 liters of peritoneal fluid. PLAN: If the patient eventually requires >/=2 paracenteses in a 30 day period, candidacy for formal evaluation by the [facility] will be assessed. Procedure performed by: Sherrilee Bal, PA-C under the supervision of Dr. ONEIDA Specking Electronically Signed   By: CHRISTELLA.  Shick  M.D.   On: 07/12/2024 10:41     Scheduled Meds:  amLODipine   5 mg Oral Daily   azelastine   1 spray Each Nare BID   fluticasone   1 spray Each Nare Daily   furosemide   60 mg Intravenous BID   insulin  aspart  0-9 Units Subcutaneous TID WC   potassium chloride   40 mEq Oral BID   spironolactone  25 mg Oral Daily   Continuous Infusions:  cefTRIAXone  (ROCEPHIN )  IV 2 g (07/14/24 0856)     LOS: 2 days    Time spent:    Sigurd Pac, MD [facility]   07/14/2024, 10:06 AM

## 2024-07-15 DIAGNOSIS — K7460 Unspecified cirrhosis of liver: Secondary | ICD-10-CM | POA: Diagnosis not present

## 2024-07-15 DIAGNOSIS — R188 Other ascites: Secondary | ICD-10-CM | POA: Diagnosis not present

## 2024-07-15 DIAGNOSIS — K529 Noninfective gastroenteritis and colitis, unspecified: Secondary | ICD-10-CM | POA: Diagnosis not present

## 2024-07-15 DIAGNOSIS — B182 Chronic viral hepatitis C: Secondary | ICD-10-CM | POA: Diagnosis not present

## 2024-07-15 LAB — BASIC METABOLIC PANEL WITH GFR
Anion gap: 11 (ref 5–15)
BUN: 18 mg/dL (ref 8–23)
CO2: 24 mmol/L (ref 22–32)
Calcium: 8.3 mg/dL — ABNORMAL LOW (ref 8.9–10.3)
Chloride: 103 mmol/L (ref 98–111)
Creatinine, Ser: 1.45 mg/dL — ABNORMAL HIGH (ref 0.44–1.00)
GFR, Estimated: 40 mL/min — ABNORMAL LOW (ref >60)
Glucose, Bld: 92 mg/dL (ref 70–99)
Potassium: 3.8 mmol/L (ref 3.5–5.1)
Sodium: 138 mmol/L (ref 135–145)

## 2024-07-15 LAB — GLUCOSE, CAPILLARY
Glucose-Capillary: 77 mg/dL (ref 70–99)
Glucose-Capillary: 102 mg/dL — ABNORMAL HIGH (ref 70–99)
Glucose-Capillary: 101 mg/dL — ABNORMAL HIGH (ref 70–99)
Glucose-Capillary: 143 mg/dL — ABNORMAL HIGH (ref 70–99)

## 2024-07-15 MED ORDER — ALBUTEROL SULFATE (2.5 MG/3ML) 0.083% IN NEBU
2.5000 mg | INHALATION_SOLUTION | RESPIRATORY_TRACT | Status: DC | PRN
  Administered 2024-07-15: 2.5 mg via RESPIRATORY_TRACT
  Filled 2024-07-15: qty 3

## 2024-07-15 MED ORDER — ENOXAPARIN SODIUM 40 MG/0.4ML IJ SOSY: 40.0000 mg | PREFILLED_SYRINGE | INTRAMUSCULAR | Status: DC

## 2024-07-15 MED ORDER — POTASSIUM CHLORIDE CRYS ER 20 MEQ PO TBCR
40.0000 meq | EXTENDED_RELEASE_TABLET | Freq: Once | ORAL | Status: AC
  Administered 2024-07-15: 40 meq via ORAL
  Filled 2024-07-15: qty 2

## 2024-07-15 MED ORDER — POTASSIUM CHLORIDE CRYS ER 20 MEQ PO TBCR: 40.0000 meq | EXTENDED_RELEASE_TABLET | Freq: Two times a day (BID) | ORAL | Status: DC

## 2024-07-15 NOTE — Progress Notes (Signed)
 PROGRESS NOTE    Susan Hogan  FMW:[id number] DOB: March 18, 1961 DOA: 07/11/2024 PCP: Samie Frederick, PA-C  63/F with history of OSA, obesity, hep C, cirrhosis, type 2 diabetes mellitus, hypertension, dyslipidemia presented to the ED with progressive lower extremity edema and abdominal distention for few weeks. - In the ER hypertensive, creatinine 1.2, agree with proBNP over 4000, troponin 21, 26, chest x-ray was unremarkable, CT abdomen pelvis noted liver cirrhosis with considerable ascites and anasarca. - Admitted, started on diuretics, underwent paracentesis, 1.4 L drained - AFP elevated, plan for liver MRI   Subjective: - Still with mild abdominal discomfort, breathing better  Assessment and Plan:  Decompensated liver cirrhosis, history of hep C Acute on chronic diastolic CHF Portal hypertension, ascites -Underwent large-volume paracentesis, 4.5 L drained - Volume status improving, continue Lasix  60 mg p.o. daily, Aldactone, 6.8 L negative continue Unna boots  -2D echo noted EF of 60-65, grade 1 DD, normal RV - On ceftriaxone  for SBP prophylaxis - GI following, recommended liver MRI prior to discharge and follow-up with [facility]  Elevated AFP, cirrhosis - Plan for MRI liver prior to discharge  Hypertensive urgency  - Reviewing with diuresis, continue amlodipine    Hypokalemia -Repleted  Elevated troponin Slightly elevated.  Likely due to LV strain from volume overload  AKI on CKD 3A Baseline creatinine from May was 1.03  H/o type 2 diabetes mellitus A1c is actually low at 3.9 on 07/12/2024  Ovarian cyst CT abdomen noted slight increase in left ovarian cystic lesion size when compared with the prior exam.  Radiologist recommend follow-up US  in 6-12 months.     Diverticulosis without diverticulitis Avoid constipation   H/o migraine Used to be on Qulipta .    DVT prophylaxis:lovenox  Code Status: Full code Family Communication: None present Disposition  Plan: Home pending improvement in volume status    Objective: Vitals:   07/15/24 0600 07/15/24 0744 07/15/24 1125 07/15/24 1130  BP: (!) 154/54 (!) 141/62  (!) 156/71  Pulse: 83 83 73   Resp: 18 (!) 21 (!) 30 18  Temp:  97.7 F (36.5 C)    TempSrc:  Oral    SpO2: 98% 98% 99%   Weight:      Height:        Intake/Output Summary (Last 24 hours) at 07/15/2024 1329 Last data filed at 07/15/2024 1300 Gross per 24 hour  Intake 1130.44 ml  Output 4801 ml  Net -3670.56 ml   Filed Weights   07/14/24 0549 07/15/24 0014 07/15/24 0500  Weight: 89.5 kg 88.9 kg 88.9 kg    Examination:  General exam: Appears calm and comfortable  Respiratory system: Decreased breath sounds at the bases Cardiovascular system: S1 & S2 heard, RRR.  Abd: Distended, soft, nontender, bowel sounds present Central nervous system: Alert and oriented. No focal neurological deficits. Extremities: 1+ edema, Unna boots on Skin: No rashes Psychiatry:  Mood & affect appropriate.     Data Reviewed:   CBC: Recent Labs  Lab 07/11/24 1243 07/12/24 0242  WBC 6.3 7.6  NEUTROABS  --  5.0  HGB 10.7* 12.4  HCT 30.2* 35.9*  MCV 92.9 94.0  PLT 151 217   Basic Metabolic Panel: Recent Labs  Lab 07/11/24 1243 07/12/24 0242 07/13/24 0215 07/14/24 0226 07/15/24 0242  NA 144 143 142 141 138  K 3.2* 3.5 3.5 3.4* 3.8  CL 111 114* 110 108 103  CO2 22 20* 24 25 24   GLUCOSE 117* 115* 92 87 92  BUN 16 15 16  16 18  CREATININE 1.22* 1.38* 1.49* 1.53* 1.45*  CALCIUM  8.5* 8.3* 7.9* 7.8* 8.3*  MG 1.6* 1.9  --   --   --    GFR: Estimated Creatinine Clearance: 37.6 mL/min (A) (by C-G formula based on SCr of 1.45 mg/dL (H)). Liver Function Tests: Recent Labs  Lab 07/11/24 1243 07/14/24 0226  AST 90* 57*  ALT 52* 31  ALKPHOS 67 37*  BILITOT 0.4 0.5  PROT 4.7* 4.0*  ALBUMIN 2.6* 2.4*   Recent Labs  Lab 07/11/24 1243  LIPASE 36   No results for input(s): AMMONIA in the last 168 hours. Coagulation  Profile: Recent Labs  Lab 07/12/24 1435  INR 1.1   Cardiac Enzymes: No results for input(s): CKTOTAL, CKMB, CKMBINDEX, TROPONINI in the last 168 hours. BNP (last 3 results) Recent Labs    07/11/24 1243  PROBNP 4,021.0*   HbA1C: No results for input(s): HGBA1C in the last 72 hours.  CBG: Recent Labs  Lab 07/14/24 1042 07/14/24 1615 07/14/24 2104 07/15/24 0611 07/15/24 1127  GLUCAP 91 83 95 77 102*   Lipid Profile: No results for input(s): CHOL, HDL, LDLCALC, TRIG, CHOLHDL, LDLDIRECT in the last 72 hours. Thyroid  Function Tests: No results for input(s): TSH, T4TOTAL, FREET4, T3FREE, THYROIDAB in the last 72 hours.  Anemia Panel: No results for input(s): VITAMINB12, FOLATE, FERRITIN, TIBC, IRON, RETICCTPCT in the last 72 hours.  Urine analysis:    Component Value Date/Time   COLORURINE COLORLESS (A) 07/11/2024 1630   APPEARANCEUR CLEAR 07/11/2024 1630   LABSPEC 1.005 07/11/2024 1630   PHURINE 6.0 07/11/2024 1630   GLUCOSEU NEGATIVE 07/11/2024 1630   HGBUR MODERATE (A) 07/11/2024 1630   BILIRUBINUR NEGATIVE 07/11/2024 1630   KETONESUR NEGATIVE 07/11/2024 1630   PROTEINUR 100 (A) 07/11/2024 1630   UROBILINOGEN 0.2 05/26/2013 0048   NITRITE NEGATIVE 07/11/2024 1630   LEUKOCYTESUR NEGATIVE 07/11/2024 1630   Sepsis Labs: @LABRCNTIP (procalcitonin:4,lacticidven:4)  ) Recent Results (from the past 240 hours)  Gram stain     Status: None   Collection Time: 07/12/24 10:31 AM   Specimen: Abdomen; Peritoneal Fluid  Result Value Ref Range Status   Specimen Description PERITONEAL  Final   Special Requests ABDOMEN  Final   Gram Stain   Final    NO WBC SEEN NO ORGANISMS SEEN Performed at [facility], [address], [place], [place] [address]    Report Status 07/12/2024 FINAL  Final     Radiology Studies: No results found.    Scheduled Meds:  amLODipine   5 mg Oral Daily   azelastine   1 spray Each Nare BID    fluticasone   1 spray Each Nare Daily   furosemide   60 mg Intravenous BID   insulin  aspart  0-9 Units Subcutaneous TID WC   spironolactone  25 mg Oral Daily   Continuous Infusions:  cefTRIAXone  (ROCEPHIN )  IV 2 g (07/15/24 0951)     LOS: 3 days    Time spent:    Sigurd Pac, MD [facility]   07/15/2024, 1:29 PM

## 2024-07-16 ENCOUNTER — Inpatient Hospital Stay (HOSPITAL_COMMUNITY)

## 2024-07-16 DIAGNOSIS — K7460 Unspecified cirrhosis of liver: Secondary | ICD-10-CM | POA: Diagnosis not present

## 2024-07-16 DIAGNOSIS — B182 Chronic viral hepatitis C: Secondary | ICD-10-CM | POA: Diagnosis not present

## 2024-07-16 DIAGNOSIS — K529 Noninfective gastroenteritis and colitis, unspecified: Secondary | ICD-10-CM | POA: Diagnosis not present

## 2024-07-16 DIAGNOSIS — R188 Other ascites: Secondary | ICD-10-CM | POA: Diagnosis not present

## 2024-07-16 LAB — BASIC METABOLIC PANEL WITH GFR
Anion gap: 13 (ref 5–15)
BUN: 23 mg/dL (ref 8–23)
CO2: 28 mmol/L (ref 22–32)
Calcium: 8.8 mg/dL — ABNORMAL LOW (ref 8.9–10.3)
Chloride: 101 mmol/L (ref 98–111)
Creatinine, Ser: 1.41 mg/dL — ABNORMAL HIGH (ref 0.44–1.00)
GFR, Estimated: 41 mL/min — ABNORMAL LOW (ref >60)
Glucose, Bld: 131 mg/dL — ABNORMAL HIGH (ref 70–99)
Potassium: 4 mmol/L (ref 3.5–5.1)
Sodium: 142 mmol/L (ref 135–145)

## 2024-07-16 LAB — CBC
HCT: 34.5 % — ABNORMAL LOW (ref 36.0–46.0)
Hemoglobin: 11.5 g/dL — ABNORMAL LOW (ref 12.0–15.0)
MCH: 31.9 pg (ref 26.0–34.0)
MCHC: 33.3 g/dL (ref 30.0–36.0)
MCV: 95.6 fL (ref 80.0–100.0)
Platelets: 211 K/uL (ref 150–400)
RBC: 3.61 MIL/uL — ABNORMAL LOW (ref 3.87–5.11)
RDW: 14.4 % (ref 11.5–15.5)
WBC: 7.2 K/uL (ref 4.0–10.5)
nRBC: 0 % (ref 0.0–0.2)

## 2024-07-16 LAB — GLUCOSE, CAPILLARY
Glucose-Capillary: 94 mg/dL (ref 70–99)
Glucose-Capillary: 91 mg/dL (ref 70–99)
Glucose-Capillary: 96 mg/dL (ref 70–99)
Glucose-Capillary: 117 mg/dL — ABNORMAL HIGH (ref 70–99)

## 2024-07-16 MED ORDER — POLYETHYLENE GLYCOL 3350 17 G PO PACK
17.0000 g | PACK | Freq: Every day | ORAL | Status: DC | PRN
  Administered 2024-07-17: 17 g via ORAL
  Filled 2024-07-16: qty 1

## 2024-07-16 MED ORDER — LACTULOSE 10 GM/15ML PO SOLN: 20.0000 g | Freq: Two times a day (BID) | ORAL | Status: DC

## 2024-07-16 MED ORDER — GADOBUTROL 1 MMOL/ML IV SOLN
8.5000 mL | Freq: Once | INTRAVENOUS | Status: AC | PRN
  Administered 2024-07-16: 8.5 mL via INTRAVENOUS

## 2024-07-16 MED ORDER — SENNOSIDES-DOCUSATE SODIUM 8.6-50 MG PO TABS
1.0000 | ORAL_TABLET | Freq: Two times a day (BID) | ORAL | Status: DC
  Filled 2024-07-16 (×3): qty 1

## 2024-07-16 NOTE — Plan of Care (Signed)
  Problem: Education: Goal: Knowledge of General Education information will improve Description: Including pain rating scale, medication(s)/side effects and non-pharmacologic comfort measures Outcome: Progressing   Problem: Nutrition: Goal: Adequate nutrition will be maintained Outcome: Progressing   Problem: Safety: Goal: Ability to remain free from injury will improve Outcome: Progressing   Problem: Nutritional: Goal: Maintenance of adequate nutrition will improve Outcome: Progressing

## 2024-07-16 NOTE — Progress Notes (Signed)
 PROGRESS NOTE    Susan Hogan  FMW:[id number] DOB: September 13, 1961 DOA: 07/11/2024 PCP: Samie Frederick, PA-C  63/F with history of OSA, obesity, hep C, cirrhosis, type 2 diabetes mellitus, hypertension, dyslipidemia presented to the ED with progressive lower extremity edema and abdominal distention for few weeks. - In the ER hypertensive, creatinine 1.2, agree with proBNP over 4000, troponin 21, 26, chest x-ray was unremarkable, CT abdomen pelvis noted liver cirrhosis with considerable ascites and anasarca. - Admitted, started on diuretics, underwent paracentesis, 1.4 L drained - AFP elevated, plan for liver MRI   Subjective: - Mild right sided abdominal discomfort, breathing and swelling improving  Assessment and Plan:  Decompensated liver cirrhosis, history of hep C Acute on chronic diastolic CHF Portal hypertension, ascites -Underwent large-volume paracentesis, 4.5 L drained - Volume status improving, continue Lasix  60 mg p.o. daily, Aldactone, 10.9 L negative, weight down 27 LB, continue Unna boots  -2D echo noted EF of 60-65, grade 1 DD, normal RV - On ceftriaxone  for SBP prophylaxis - GI following, recommended liver MRI prior to discharge and follow-up with [facility]  Elevated AFP, cirrhosis - Plan for MRI liver today  Hypertensive urgency  - Reviewing with diuresis, continue amlodipine    Hypokalemia -Repleted  Elevated troponin Slightly elevated.  Likely due to LV strain from volume overload  AKI on CKD 3A Baseline creatinine from May was 1.03 - Creatinine now stable in the 1.4-1.5 range  H/o type 2 diabetes mellitus A1c is actually low at 3.9 on 07/12/2024  Ovarian cyst CT abdomen noted slight increase in left ovarian cystic lesion size when compared with the prior exam.  Radiologist recommend follow-up US  in 6-12 months.     Diverticulosis without diverticulitis Avoid constipation   H/o migraine Used to be on Qulipta .    DVT  prophylaxis:lovenox  Code Status: Full code Family Communication: None present Disposition Plan: Home pending improvement in volume status    Objective: Vitals:   07/16/24 0148 07/16/24 0149 07/16/24 0544 07/16/24 0722  BP: (!) 139/52   (!) 173/70  Pulse:    82  Resp: (!) 28 17  14   Temp:    98.2 F (36.8 C)  TempSrc:    Tympanic  SpO2:    100%  Weight:   85.1 kg   Height:        Intake/Output Summary (Last 24 hours) at 07/16/2024 1034 Last data filed at 07/16/2024 0428 Gross per 24 hour  Intake 537 ml  Output 4400 ml  Net -3863 ml   Filed Weights   07/15/24 0014 07/15/24 0500 07/16/24 0544  Weight: 88.9 kg 88.9 kg 85.1 kg    Examination:  General exam: Appears calm and comfortable  Respiratory system: Decreased breath sounds at the bases Cardiovascular system: S1 & S2 heard, RRR.  Abd: Distended, soft, nontender, bowel sounds present Central nervous system: Alert and oriented. No focal neurological deficits. Extremities: 1+ edema, Unna boots on Skin: No rashes Psychiatry:  Mood & affect appropriate.     Data Reviewed:   CBC: Recent Labs  Lab 07/11/24 1243 07/12/24 0242 07/16/24 0141  WBC 6.3 7.6 7.2  NEUTROABS  --  5.0  --   HGB 10.7* 12.4 11.5*  HCT 30.2* 35.9* 34.5*  MCV 92.9 94.0 95.6  PLT 151 217 211   Basic Metabolic Panel: Recent Labs  Lab 07/11/24 1243 07/12/24 0242 07/13/24 0215 07/14/24 0226 07/15/24 0242 07/16/24 0141  NA 144 143 142 141 138 142  K 3.2* 3.5 3.5 3.4* 3.8 4.0  CL 111 114* 110 108 103 101  CO2 22 20* 24 25 24 28   GLUCOSE 117* 115* 92 87 92 131*  BUN 16 15 16 16 18 23   CREATININE 1.22* 1.38* 1.49* 1.53* 1.45* 1.41*  CALCIUM  8.5* 8.3* 7.9* 7.8* 8.3* 8.8*  MG 1.6* 1.9  --   --   --   --    GFR: Estimated Creatinine Clearance: 37.7 mL/min (A) (by C-G formula based on SCr of 1.41 mg/dL (H)). Liver Function Tests: Recent Labs  Lab 07/11/24 1243 07/14/24 0226  AST 90* 57*  ALT 52* 31  ALKPHOS 67 37*  BILITOT  0.4 0.5  PROT 4.7* 4.0*  ALBUMIN 2.6* 2.4*   Recent Labs  Lab 07/11/24 1243  LIPASE 36   No results for input(s): AMMONIA in the last 168 hours. Coagulation Profile: Recent Labs  Lab 07/12/24 1435  INR 1.1   Cardiac Enzymes: No results for input(s): CKTOTAL, CKMB, CKMBINDEX, TROPONINI in the last 168 hours. BNP (last 3 results) Recent Labs    07/11/24 1243  PROBNP 4,021.0*   HbA1C: No results for input(s): HGBA1C in the last 72 hours.  CBG: Recent Labs  Lab 07/15/24 0611 07/15/24 1127 07/15/24 1605 07/15/24 2129 07/16/24 0627  GLUCAP 77 102* 101* 143* 94   Lipid Profile: No results for input(s): CHOL, HDL, LDLCALC, TRIG, CHOLHDL, LDLDIRECT in the last 72 hours. Thyroid  Function Tests: No results for input(s): TSH, T4TOTAL, FREET4, T3FREE, THYROIDAB in the last 72 hours.  Anemia Panel: No results for input(s): VITAMINB12, FOLATE, FERRITIN, TIBC, IRON, RETICCTPCT in the last 72 hours.  Urine analysis:    Component Value Date/Time   COLORURINE COLORLESS (A) 07/11/2024 1630   APPEARANCEUR CLEAR 07/11/2024 1630   LABSPEC 1.005 07/11/2024 1630   PHURINE 6.0 07/11/2024 1630   GLUCOSEU NEGATIVE 07/11/2024 1630   HGBUR MODERATE (A) 07/11/2024 1630   BILIRUBINUR NEGATIVE 07/11/2024 1630   KETONESUR NEGATIVE 07/11/2024 1630   PROTEINUR 100 (A) 07/11/2024 1630   UROBILINOGEN 0.2 05/26/2013 0048   NITRITE NEGATIVE 07/11/2024 1630   LEUKOCYTESUR NEGATIVE 07/11/2024 1630   Sepsis Labs: @LABRCNTIP (procalcitonin:4,lacticidven:4)  ) Recent Results (from the past 240 hours)  Gram stain     Status: None   Collection Time: 07/12/24 10:31 AM   Specimen: Abdomen; Peritoneal Fluid  Result Value Ref Range Status   Specimen Description PERITONEAL  Final   Special Requests ABDOMEN  Final   Gram Stain   Final    NO WBC SEEN NO ORGANISMS SEEN Performed at [facility], [address] [place], [place] [address]     Report Status 07/12/2024 FINAL  Final     Radiology Studies: No results found.    Scheduled Meds:  amLODipine   5 mg Oral Daily   azelastine   1 spray Each Nare BID   enoxaparin  (LOVENOX ) injection  40 mg Subcutaneous Q24H   fluticasone   1 spray Each Nare Daily   furosemide   60 mg Intravenous BID   insulin  aspart  0-9 Units Subcutaneous TID WC   senna-docusate  1 tablet Oral BID   spironolactone  25 mg Oral Daily   Continuous Infusions:  cefTRIAXone  (ROCEPHIN )  IV 2 g (07/15/24 0951)     LOS: 4 days    Time spent:    Sigurd Pac, MD [facility]   07/16/2024, 10:34 AM

## 2024-07-16 NOTE — Plan of Care (Signed)
  Problem: Clinical Measurements: Goal: Will remain free from infection Outcome: Progressing Goal: Cardiovascular complication will be avoided Outcome: Progressing   Problem: Nutrition: Goal: Adequate nutrition will be maintained Outcome: Progressing   Problem: Coping: Goal: Level of anxiety will decrease Outcome: Progressing   Problem: Safety: Goal: Ability to remain free from injury will improve Outcome: Progressing

## 2024-07-17 ENCOUNTER — Other Ambulatory Visit (HOSPITAL_COMMUNITY): Payer: Self-pay

## 2024-07-17 DIAGNOSIS — R601 Generalized edema: Secondary | ICD-10-CM | POA: Diagnosis not present

## 2024-07-17 DIAGNOSIS — K7460 Unspecified cirrhosis of liver: Secondary | ICD-10-CM | POA: Diagnosis not present

## 2024-07-17 DIAGNOSIS — R188 Other ascites: Secondary | ICD-10-CM | POA: Diagnosis not present

## 2024-07-17 DIAGNOSIS — K529 Noninfective gastroenteritis and colitis, unspecified: Secondary | ICD-10-CM | POA: Diagnosis not present

## 2024-07-17 LAB — BASIC METABOLIC PANEL WITH GFR
Anion gap: 9 (ref 5–15)
BUN: 28 mg/dL — ABNORMAL HIGH (ref 8–23)
CO2: 29 mmol/L (ref 22–32)
Calcium: 8.1 mg/dL — ABNORMAL LOW (ref 8.9–10.3)
Chloride: 102 mmol/L (ref 98–111)
Creatinine, Ser: 1.77 mg/dL — ABNORMAL HIGH (ref 0.44–1.00)
GFR, Estimated: 32 mL/min — ABNORMAL LOW (ref >60)
Glucose, Bld: 90 mg/dL (ref 70–99)
Potassium: 3.7 mmol/L (ref 3.5–5.1)
Sodium: 140 mmol/L (ref 135–145)

## 2024-07-17 LAB — GLUCOSE, CAPILLARY
Glucose-Capillary: 109 mg/dL — ABNORMAL HIGH (ref 70–99)
Glucose-Capillary: 82 mg/dL (ref 70–99)
Glucose-Capillary: 89 mg/dL (ref 70–99)

## 2024-07-17 MED ORDER — SPIRONOLACTONE 25 MG PO TABS
25.0000 mg | ORAL_TABLET | Freq: Every day | ORAL | 0 refills | Status: AC
  Filled 2024-07-17: qty 60, 60d supply, fill #0

## 2024-07-17 MED ORDER — SENNOSIDES-DOCUSATE SODIUM 8.6-50 MG PO TABS
1.0000 | ORAL_TABLET | Freq: Two times a day (BID) | ORAL | 0 refills | Status: DC
  Filled 2024-07-17: qty 60, 30d supply, fill #0

## 2024-07-17 MED ORDER — FUROSEMIDE 40 MG PO TABS
40.0000 mg | ORAL_TABLET | Freq: Every day | ORAL | 0 refills | Status: AC
  Filled 2024-07-17: qty 60, 60d supply, fill #0

## 2024-07-17 MED ORDER — FUROSEMIDE 40 MG PO TABS
40.0000 mg | ORAL_TABLET | Freq: Every day | ORAL | Status: DC
  Administered 2024-07-17: 40 mg via ORAL
  Filled 2024-07-17: qty 1

## 2024-07-17 NOTE — TOC Transition Note (Signed)
 Transition of Care [facility]) - Discharge Note   Patient Details  Name: Susan Hogan MRN: [id number] Date of Birth: 07/01/1961  [facility] CM/SW Contact:  Waddell Barnie Rama, RN Phone Number: 07/17/2024, 2:23 PM   Clinical Narrative:    For dc today, she will need a cab at dc.       Barriers to Discharge: Continued Medical Work up   Patient Goals and CMS Choice            Discharge Placement                       Discharge Plan and Services Additional resources added to the After Visit Summary for                                       Social Drivers of Health (SDOH) Interventions SDOH Screenings   Food Insecurity: No Food Insecurity (07/11/2024)  Housing: High Risk (07/11/2024)  Transportation Needs: Unknown (07/11/2024)  Utilities: Not At Risk (07/11/2024)  Financial Resource Strain: Low Risk  (01/13/2024)   Received from [facility]  Recent Concern: Financial Resource Strain - Medium Risk (11/02/2023)   Received from [facility]  Physical Activity: Insufficiently Active (01/13/2024)   Received from [facility]  Social Connections: Socially Isolated (07/11/2024)  Stress: No Stress Concern Present (04/08/2024)   [facility]  Recent Concern: Stress - Stress Concern Present (01/13/2024)   Received from [facility]  Tobacco Use: Low Risk  (07/12/2024)     Readmission Risk Interventions     No data to display

## 2024-07-17 NOTE — Care Management Important Message (Signed)
 Important Message  Patient Details  Name: Susan Hogan MRN: [id number] Date of Birth: December 26, 1960   Important Message Given:  Yes - Medicare IM     [name] Sharps 07/17/2024, 11:00 AM

## 2024-07-17 NOTE — Progress Notes (Signed)
 [facility] ROUNDING NOTE   Subjective: Patient feeling better.  Swelling improved.  Appetite good.  Having some mild lower abdominal discomfort.  No nausea/vomiting.  No dyspnea.  Diuresing well.   Objective: Vital signs in last 24 hours: Temp:  [98 F (36.7 C)-98.8 F (37.1 C)] 98.4 F (36.9 C) (10/27 0725) Pulse Rate:  [64-79] 74 (10/27 0725) Resp:  [11-20] 18 (10/27 0725) BP: (122-172)/(59-89) 170/84 (10/27 0725) SpO2:  [95 %-100 %] 98 % (10/27 0725) Last BM Date : 07/16/24 General: NAD, pleasant Hispanic female Lungs:  CTA b/l, no w/r/r Heart:  RRR, no m/r/g Abdomen:  Soft, NT, ND, +BS Ext:  B/l LE wrapped in compression bandages, not removed    Intake/Output from previous day: 10/26 0701 - 10/27 0700 In: 657 [P.O.:657] Out: 2900 [Urine:2900] Intake/Output this shift: Total I/O In: 120 [P.O.:120] Out: 200 [Urine:200]   Lab Results: Recent Labs    07/16/24 0141  WBC 7.2  HGB 11.5*  PLT 211  MCV 95.6   BMET Recent Labs    07/15/24 0242 07/16/24 0141 07/17/24 0150  NA 138 142 140  K 3.8 4.0 3.7  CL 103 101 102  CO2 24 28 29   GLUCOSE 92 131* 90  BUN 18 23 28*  CREATININE 1.45* 1.41* 1.77*  CALCIUM  8.3* 8.8* 8.1*   LFT No results for input(s): PROT, ALBUMIN, AST, ALT, ALKPHOS, BILITOT, BILIDIR, IBILI in the last 72 hours. PT/INR No results for input(s): INR in the last 72 hours.    Imaging/Other results: MR LIVER W WO CONTRAST Result Date: 07/16/2024 EXAM: MRI ABDOMEN 07/16/2024 02:35:00 PM TECHNIQUE: Multiplanar multisequence MRI of the abdomen was performed with and without the administration of intravenous contrast. COMPARISON: CT abdomen/pelvis dated 07/12/2024. CLINICAL HISTORY: Other stimulant related disorder; cirrhosis with elevated ALP. FINDINGS: LIVER: Diffusely irregular liver surface and relative hypertrophy of the lateral segment left liver, compatible with cirrhosis. No hepatic steatosis. No liver  masses. No foci of arterial hyperenhancement in the liver. GALLBLADDER AND BILIARY SYSTEM: Cholecystectomy. No intrahepatic biliary ductal dilatation. Common bile duct diameter 6 mm, within normal post cholecystectomy limits. No appreciable choledocholithiasis. SPLEEN: Normal size spleen. No splenic mass. PANCREAS: Unremarkable. ADRENAL GLANDS: Unremarkable. KIDNEYS: Scattered sub 5 mm tiny simple bilateral renal cysts, for which no follow up imaging is recommended. No suspicious renal masses. No hydronephrosis. LYMPH NODES: No lymphadenopathy. VASCULATURE: No acute vascular abnormality. Small gastroesophageal and paraumbilical varices. PERITONEUM: Small to moderate volume ascites. BOWEL: Grossly unremarkable. ABDOMINAL WALL: No acute abnormality. BONES: Mild bony wall edema. IMPRESSION: 1. Cirrhosis with small to moderate ascites and small gastroesophageal and paraumbilical varices. Normal size spleen. 2. No liver masses. Electronically signed by: Selinda Blue MD 07/16/2024 03:09 PM EDT RP Workstation: [id number]   MELD 3.0: 14 at 07/14/2024  2:26 AM MELD-Na: 11 at 07/14/2024  2:26 AM Calculated from: Serum Creatinine: 1.53 mg/dL at 89/75/7974  7:73 AM Serum Sodium: 141 mmol/L (Using max of 137 mmol/L) at 07/14/2024  2:26 AM Total Bilirubin: 0.5 mg/dL (Using min of 1 mg/dL) at 89/75/7974  7:73 AM Serum Albumin: 2.4 g/dL at 89/75/7974  7:73 AM INR(ratio): 1.1 at 07/12/2024  2:35 PM Age at listing (hypothetical): 63 years Sex: Female at 07/14/2024  2:26 AM     Assessment and Plan:  63 year old female with HCV, DM, obesity, HTN, CKD 3, diastolic heart failure, admitted with hypervolemia with ascites and significant LE edema.  Has been diuresing well since admission.  HCV/?MASH cirrhosis with ascites and volume overload -  s/p paracentesis with 4.5 liters removed.  No SBP.  TP and Alb undetectable (accurate SAAG not calculable but likely high) - HCV 1a with viral load 470,000u/mL - Previous attempt  at HCV treatment ([facility]) limited by allergic reaction/intolerance to medication  - Ascites/edema may be multifactorial with diastolic CHF contributing - Continue diuresis per hospitalist (currently on lasix  40 mg PO daily and aldactone 25 mg daily) - No evidence of SBP and patient not at significant risk of SBP based on CPT score; ok to discontinue empiric antibiotics from GI standpoint. - Would not recommend any treatment for HCV during this admission; will defer to hepatology as outpatient - Low sodium diet; importance of this reinforced with patient today - No history of HE that I could find and patient not encephalopathic.  Ok to continue lactulose as needed for constipation, but don't know that she needs lactulose for HE. - May need repeat EGD for variceal screening at some point (need to get previous EGD report)  Chronic diarrhea - No BM for 3 days per patient until she received lactulose - Calprotectin/elastase ordered - Suspect bile-acid diarrhea following cholecystectomy - Would recommend trial of cholestyramine 4 grams PO 1-2x/day  if diarrhea returns  Elevated AFP - MRI unremarkable, no evidence of HCC - AFP secondary to cirrhosis - Recommend repeat RUQUS in 6 months for [facility] screeening  CKD III - Slight increase in Scr with diuresis - Continue to monitor      Patient has appointment with [facility] Sep 18, 2024 at 2:15 at which point she can be considered for treatment of her HCV  Glendia FORBES Holt, MD  07/17/2024, 9:33 AM [facility]

## 2024-07-17 NOTE — Plan of Care (Signed)
   Problem: Education: Goal: Knowledge of General Education information will improve Description: Including pain rating scale, medication(s)/side effects and non-pharmacologic comfort measures Outcome: Progressing   Problem: Health Behavior/Discharge Planning: Goal: Ability to manage health-related needs will improve Outcome: Progressing   Problem: Clinical Measurements: Goal: Will remain free from infection Outcome: Progressing

## 2024-07-17 NOTE — Care Management Important Message (Signed)
 Important Message  Patient Details  Name: Susan Hogan MRN: [id number] Date of Birth: [birth year]   Important Message Given:  Yes - Medicare IM     [name] Sharps 07/17/2024, 11:01 AM

## 2024-07-17 NOTE — Discharge Summary (Signed)
 Physician Discharge Summary  Susan Hogan FMW:[id number] DOB: 07/09/1961 DOA: 07/11/2024  PCP: Samie Frederick, PA-C  Admit date: 07/11/2024 Discharge date: 07/17/2024  Time spent: 45 minutes  Recommendations for Outpatient Follow-up:  [facility] in 2 weeks [facility] in 3 to 4 weeks PCP in 1 week, please check BMP at follow-up  Discharge Diagnoses:  Principal Problem:   Anasarca Decompensated liver cirrhosis Ascites Acute on chronic diastolic CHF   Diabetes mellitus (HCC)   Chronic hepatitis C virus infection with cirrhosis (HCC)   Acute on chronic heart failure with preserved ejection fraction (HFpEF) (HCC)   Hypertensive urgency   Cirrhosis of liver (HCC)   Other ascites   Chronic diarrhea   Discharge Condition: Improved  Diet recommendation: Heart healthy, diabetic  Filed Weights   07/15/24 0014 07/15/24 0500 07/16/24 0544  Weight: 88.9 kg 88.9 kg 85.1 kg    History of present illness:  63/F with history of OSA, obesity, hep C, cirrhosis, type 2 diabetes mellitus, hypertension, dyslipidemia presented to the ED with progressive lower extremity edema and abdominal distention for few weeks. - In the ER hypertensive, creatinine 1.2, agree with proBNP over 4000, troponin 21, 26, chest x-ray was unremarkable, CT abdomen pelvis noted liver cirrhosis with considerable ascites and anasarca. - Admitted, started on diuretics, underwent paracentesis, 1.4 L drained - AFP elevated, plan for liver MRI  Hospital Course:   Decompensated liver cirrhosis, history of hep C Acute on chronic diastolic CHF Portal hypertension, ascites -Underwent large-volume paracentesis, 4.5 L drained - Volume status improving, continue Lasix  60 mg p.o. daily, Aldactone, 13 L negative, weight down 30 LB,  -2D echo noted EF of 60-65, grade 1 DD, normal RV - Rx w ceftriaxone  for SBP prophylaxis - Now switched to oral Lasix  and spironolactone - Follow-up with [facility] and [facility]    Elevated AFP, cirrhosis - Right liver negative for masses, HCC, known cirrhosis noted -Follow-up with GI as outpatient, needs a colonoscopy   Hypertensive urgency  - Reviewing with diuresis, continue amlodipine    Hypokalemia -Repleted   Elevated troponin Slightly elevated.  Likely due to LV strain from volume overload   AKI on CKD 3A Baseline creatinine from May was 1.03 - Creatinine now stable in the 1.4-1.5 range   H/o type 2 diabetes mellitus A1c is actually low at 3.9 on 07/12/2024   Ovarian cyst CT abdomen noted slight increase in left ovarian cystic lesion size when compared with the prior exam.  Radiologist recommend follow-up US  in 6-12 months.     Diverticulosis without diverticulitis Avoid constipation   H/o migraine Used to be on Qulipta .  Discharge Exam: Vitals:   07/17/24 0410 07/17/24 0725  BP: (!) 144/73 (!) 170/84  Pulse: 73 74  Resp:  18  Temp: 98.8 F (37.1 C) 98.4 F (36.9 C)  SpO2: 98% 98%   General exam: Appears calm and comfortable  Respiratory system: Decreased breath sounds at the bases Cardiovascular system: S1 & S2 heard, RRR.  Abd: Distended, soft, nontender, bowel sounds present Central nervous system: Alert and oriented. No focal neurological deficits. Extremities:  trace edema, Unna boots on Skin: No rashes Psychiatry:  Mood & affect appropriate.   Discharge Instructions    Allergies as of 07/17/2024       Reactions   Anesthetic Ether [ether] Shortness Of Breath   IV anesthesia causes extreme pain and tightness in back and ribcage.   Nubain [nalbuphine Hcl] Anaphylaxis   Penicillins Hives, Shortness Of Breath   DID  THE REACTION INVOLVE: Swelling of the face/tongue/throat, SOB, or low BP? Yes Sudden or severe rash/hives, skin peeling, or the inside of the mouth or nose? Yes Did it require medical treatment? Yes When did it last happen? About 3 years ago    If all above answers are "NO", may proceed with cephalosporin use.    Prednisone  Palpitations   Lisinopril  Hives   Advair Diskus [fluticasone -salmeterol] Hives   Lactose Intolerance (gi) Diarrhea   Tizanidine  Palpitations, Other (See Comments)   made my right side numb        Medication List     STOP taking these medications    hydrALAZINE  25 MG tablet Commonly known as: APRESOLINE    hydrochlorothiazide 25 MG tablet Commonly known as: HYDRODIURIL   ibuprofen  200 MG tablet Commonly known as: ADVIL    olmesartan  40 MG tablet Commonly known as: BENICAR    Qulipta  60 MG Tabs Generic drug: Atogepant        TAKE these medications    albuterol  (2.5 MG/3ML) 0.083% nebulizer solution Commonly known as: PROVENTIL  Take 2.5 mg by nebulization every 6 (six) hours as needed for wheezing or shortness of breath.   albuterol  108 (90 Base) MCG/ACT inhaler Commonly known as: VENTOLIN  HFA Inhale 1-2 puffs into the lungs every 6 (six) hours as needed for wheezing or shortness of breath.   amLODipine  10 MG tablet Commonly known as: NORVASC  Take 1 tablet (10 mg total) by mouth daily.   azelastine  0.1 % nasal spray Commonly known as: ASTELIN  Place 1 spray into both nostrils 2 (two) times daily as needed for allergies.   cetirizine  10 MG tablet Commonly known as: ZYRTEC  Take 1 tablet (10 mg total) by mouth daily as needed for allergies.   fluticasone  50 MCG/ACT nasal spray Commonly known as: FLONASE  Place 1 spray into both nostrils daily as needed for allergies.   furosemide  40 MG tablet Commonly known as: LASIX  Take 1 tablet (40 mg total) by mouth daily. What changed:  medication strength how much to take   methocarbamol  500 MG tablet Commonly known as: ROBAXIN  Take 1 tablet (500 mg total) by mouth every 8 (eight) hours as needed for muscle spasms.   Olopatadine  HCl 0.2 % Soln Commonly known as: Pataday  Place 1 drop into both eyes 1 day or 1 dose. What changed:  when to take this reasons to take this   potassium chloride  10 MEQ  tablet Commonly known as: KLOR-CON  M Take 10 mEq by mouth daily.   senna-docusate 8.6-50 MG tablet Commonly known as: Senokot-S Take 1 tablet by mouth 2 (two) times daily.   spironolactone 25 MG tablet Commonly known as: ALDACTONE Take 1 tablet (25 mg total) by mouth daily. What changed:  medication strength how much to take       Allergies  Allergen Reactions   Anesthetic Ether [Ether] Shortness Of Breath    IV anesthesia causes extreme pain and tightness in back and ribcage.   Nubain [Nalbuphine Hcl] Anaphylaxis   Penicillins Hives and Shortness Of Breath    DID THE REACTION INVOLVE: Swelling of the face/tongue/throat, SOB, or low BP? Yes Sudden or severe rash/hives, skin peeling, or the inside of the mouth or nose? Yes Did it require medical treatment? Yes When did it last happen? About 3 years ago    If all above answers are "NO", may proceed with cephalosporin use.   Prednisone  Palpitations   Lisinopril  Hives   Advair Diskus [Fluticasone -Salmeterol] Hives   Lactose Intolerance (Gi) Diarrhea   Tizanidine   Palpitations and Other (See Comments)    made my right side numb      The results of significant diagnostics from this hospitalization (including imaging, microbiology, ancillary and laboratory) are listed below for reference.    Significant Diagnostic Studies: MR LIVER W WO CONTRAST Result Date: 07/16/2024 EXAM: MRI ABDOMEN 07/16/2024 02:35:00 PM TECHNIQUE: Multiplanar multisequence MRI of the abdomen was performed with and without the administration of intravenous contrast. COMPARISON: CT abdomen/pelvis dated 07/12/2024. CLINICAL HISTORY: Other stimulant related disorder; cirrhosis with elevated ALP. FINDINGS: LIVER: Diffusely irregular liver surface and relative hypertrophy of the lateral segment left liver, compatible with cirrhosis. No hepatic steatosis. No liver masses. No foci of arterial hyperenhancement in the liver. GALLBLADDER AND BILIARY SYSTEM:  Cholecystectomy. No intrahepatic biliary ductal dilatation. Common bile duct diameter 6 mm, within normal post cholecystectomy limits. No appreciable choledocholithiasis. SPLEEN: Normal size spleen. No splenic mass. PANCREAS: Unremarkable. ADRENAL GLANDS: Unremarkable. KIDNEYS: Scattered sub 5 mm tiny simple bilateral renal cysts, for which no follow up imaging is recommended. No suspicious renal masses. No hydronephrosis. LYMPH NODES: No lymphadenopathy. VASCULATURE: No acute vascular abnormality. Small gastroesophageal and paraumbilical varices. PERITONEUM: Small to moderate volume ascites. BOWEL: Grossly unremarkable. ABDOMINAL WALL: No acute abnormality. BONES: Mild bony wall edema. IMPRESSION: 1. Cirrhosis with small to moderate ascites and small gastroesophageal and paraumbilical varices. Normal size spleen. 2. No liver masses. Electronically signed by: Selinda Blue MD 07/16/2024 03:09 PM EDT RP Workstation: [id number]   ECHOCARDIOGRAM COMPLETE Result Date: 07/13/2024    ECHOCARDIOGRAM REPORT   Patient Name:   Susan Hogan Date of Exam: 07/13/2024 Medical Rec #:  [id number]      Height:       59.0 in Accession #:    [id number]     Weight:       202.6 lb Date of Birth:  09/30/1960      BSA:          1.855 m Patient Age:    63 years       BP:           160/76 mmHg Patient Gender: F              HR:           74 bpm. Exam Location:  Inpatient Procedure: 2D Echo and Strain Analysis (Both Spectral and Color Flow Doppler            were utilized during procedure). Indications:    CHF  History:        Patient has prior history of Echocardiogram examinations.  Sonographer:    Charmaine Gaskins Referring Phys: [phone] Audris Speaker IMPRESSIONS  1. Left ventricular ejection fraction, by estimation, is 60 to 65%. The left ventricle has normal function. The left ventricle has no regional wall motion abnormalities. There is moderate concentric left ventricular hypertrophy. Left ventricular diastolic parameters are consistent  with Grade I diastolic dysfunction (impaired relaxation). The average left ventricular global longitudinal strain is -17.1 %. The global longitudinal strain is normal.  2. Right ventricular systolic function is normal. The right ventricular size is normal. There is normal pulmonary artery systolic pressure. The estimated right ventricular systolic pressure is 30.7 mmHg.  3. Left atrial size was mildly dilated.  4. The mitral valve is normal in structure. Trivial mitral valve regurgitation. No evidence of mitral stenosis.  5. The aortic valve is tricuspid. There is mild calcification of the aortic valve. Aortic valve regurgitation is not visualized. Aortic valve sclerosis/calcification is present, without  any evidence of aortic stenosis.  6. The inferior vena cava is normal in size with greater than 50% respiratory variability, suggesting right atrial pressure of 3 mmHg. FINDINGS  Left Ventricle: Left ventricular ejection fraction, by estimation, is 60 to 65%. The left ventricle has normal function. The left ventricle has no regional wall motion abnormalities. The average left ventricular global longitudinal strain is -17.1 %. Strain was performed and the global longitudinal strain is normal. The left ventricular internal cavity size was normal in size. There is moderate concentric left ventricular hypertrophy. Left ventricular diastolic parameters are consistent with Grade I diastolic dysfunction (impaired relaxation). Right Ventricle: The right ventricular size is normal. No increase in right ventricular wall thickness. Right ventricular systolic function is normal. There is normal pulmonary artery systolic pressure. The tricuspid regurgitant velocity is 2.63 m/s, and  with an assumed right atrial pressure of 3 mmHg, the estimated right ventricular systolic pressure is 30.7 mmHg. Left Atrium: Left atrial size was mildly dilated. Right Atrium: Right atrial size was normal in size. Pericardium: There is no evidence  of pericardial effusion. Mitral Valve: The mitral valve is normal in structure. Trivial mitral valve regurgitation. No evidence of mitral valve stenosis. Tricuspid Valve: The tricuspid valve is normal in structure. Tricuspid valve regurgitation is trivial. No evidence of tricuspid stenosis. Aortic Valve: The aortic valve is tricuspid. There is mild calcification of the aortic valve. Aortic valve regurgitation is not visualized. Aortic valve sclerosis/calcification is present, without any evidence of aortic stenosis. Pulmonic Valve: The pulmonic valve was normal in structure. Pulmonic valve regurgitation is not visualized. No evidence of pulmonic stenosis. Aorta: The aortic root is normal in size and structure. Venous: The inferior vena cava is normal in size with greater than 50% respiratory variability, suggesting right atrial pressure of 3 mmHg. IAS/Shunts: No atrial level shunt detected by color flow Doppler.  LEFT VENTRICLE PLAX 2D LVIDd:         4.30 cm   Diastology LVIDs:         2.70 cm   LV e' medial:    5.22 cm/s LV PW:         1.40 cm   LV E/e' medial:  15.4 LV IVS:        1.40 cm   LV e' lateral:   8.05 cm/s LVOT diam:     2.00 cm   LV E/e' lateral: 10.0 LVOT Area:     3.14 cm LV IVRT:       134 msec  2D Longitudinal Strain                          2D Strain GLS Avg:     -17.1 % RIGHT VENTRICLE RV Basal diam:  2.10 cm     PULMONARY VEINS RV Mid diam:    2.00 cm     Diastolic Velocity: 39.10 cm/s RV S prime:     14.00 cm/s  S/D Velocity:       1.70 RVSP:           30.7 mmHg   Systolic Velocity:  68.40 cm/s LEFT ATRIUM             Index        RIGHT ATRIUM           Index LA diam:        3.10 cm 1.67 cm/m   RA Pressure: 3.00 mmHg LA Vol (A2C):   72.0 ml  38.82 ml/m  RA Area:     8.94 cm LA Vol (A4C):   57.1 ml 30.79 ml/m  RA Volume:   14.60 ml  7.87 ml/m LA Biplane Vol: 66.6 ml 35.91 ml/m   AORTA Ao Root diam: 2.90 cm Ao Asc diam:  3.10 cm MITRAL VALVE                TRICUSPID VALVE MV Area (PHT):  3.33 cm     TR Peak grad:   27.7 mmHg MV Decel Time: 228 msec     TR Vmax:        263.00 cm/s MV E velocity: 80.50 cm/s   Estimated RAP:  3.00 mmHg MV A velocity: 113.00 cm/s  RVSP:           30.7 mmHg MV E/A ratio:  0.71                             SHUNTS                             Systemic Diam: 2.00 cm Toribio Fuel MD Electronically signed by Toribio Fuel MD Signature Date/Time: 07/13/2024/7:35:06 PM    Final    IR Paracentesis Result Date: 07/12/2024 INDICATION: 63 year old female with a history of liver cirrhosis secondary to hepatitis C. Imaging notable for abdominal ascites. Request for diagnostic and therapeutic paracentesis. EXAM: ULTRASOUND GUIDED DIAGNOSTIC AND THERAPEUTIC, LEFT-SIDED PARACENTESIS MEDICATIONS: 1% lidocaine , 9 mL. COMPLICATIONS: None immediate. PROCEDURE: Informed written consent was obtained from the patient after a discussion of the risks, benefits and alternatives to treatment. A timeout was performed prior to the initiation of the procedure. Initial ultrasound scanning demonstrates a moderate amount of ascites within the left lower abdominal quadrant. The left lower abdomen was prepped and draped in the usual sterile fashion. 1% lidocaine  was used for local anesthesia. Following this, a 19 gauge, 7-cm, Yueh catheter was introduced. An ultrasound image was saved for documentation purposes. The paracentesis was performed. The catheter was removed and a dressing was applied. The patient tolerated the procedure well without immediate post procedural complication. FINDINGS: A total of approximately 4.5L of cloudy yellow fluid was removed. Samples were sent to the laboratory as requested by the clinical team. IMPRESSION: Successful ultrasound-guided paracentesis yielding 4.5 liters of peritoneal fluid. PLAN: If the patient eventually requires >/=2 paracenteses in a 30 day period, candidacy for formal evaluation by the [facility]  [facility] will be assessed. Procedure performed by: Sherrilee Bal, PA-C under the supervision of Dr. ONEIDA Specking Electronically Signed   By: CHRISTELLA.  Shick M.D.   On: 07/12/2024 10:41   CT ABDOMEN PELVIS WO CONTRAST Result Date: 07/12/2024 CLINICAL DATA:  Acute abdominal pain EXAM: CT ABDOMEN AND PELVIS WITHOUT CONTRAST TECHNIQUE: Multidetector CT imaging of the abdomen and pelvis was performed following the standard protocol without IV contrast. RADIATION DOSE REDUCTION: This exam was performed according to the departmental dose-optimization program which includes automated exposure control, adjustment of the mA and/or kV according to patient size and/or use of iterative reconstruction technique. COMPARISON:  02/14/2022 FINDINGS: Lower chest: No acute abnormality. Hepatobiliary: Liver is nodular and somewhat shrunken when compared with the prior exam with increased perihepatic ascites. The gallbladder has been surgically removed. Pancreas: Unremarkable. No pancreatic ductal dilatation or surrounding inflammatory changes. Spleen: Normal in size without focal abnormality. Adrenals/Urinary Tract: Adrenal glands appear within normal limits. The  kidneys show no renal calculi or obstructive changes. The bladder is well distended. Stomach/Bowel: Scattered diverticular change of the colon is noted. No diverticulitis is seen. The appendix is not discretely visualized. Stomach is within normal limits. Small bowel shows no obstructive changes. Fluid is noted throughout the abdomen surrounding the large and small bowel. Vascular/Lymphatic: No significant vascular findings are present. No enlarged abdominal or pelvic lymph nodes. Reproductive: Uterus is stable in appearance. Left adnexal cyst measures 3.4 cm. This is slightly increased when compared with the prior exam of uncertain significance given the patient's age. Other: Considerable free fluid is noted throughout the abdomen. Changes of anasarca are noted as well. No  herniation is identified. Musculoskeletal: No acute or significant osseous findings. IMPRESSION: Cirrhotic change of the liver with considerable ascites and changes of anasarca. Slight increase in left ovarian cystic lesion size when compared with the prior exam. Recommend follow-up US  in 6-12 months. Note: This recommendation does not apply to premenarchal patients and to those with increased risk (genetic, family history, elevated tumor markers or other high-risk factors) of ovarian cancer. Reference: JACR 2020 Feb; 17(2):248-254 Diverticulosis without diverticulitis. Electronically Signed   By: Oneil Devonshire M.D.   On: 07/12/2024 02:11   US  Venous Img Lower Bilateral Result Date: 07/11/2024 CLINICAL DATA:  worsening LE edema L>R EXAM: BILATERAL LOWER EXTREMITY VENOUS DOPPLER ULTRASOUND TECHNIQUE: Gray-scale sonography with graded compression, as well as color Doppler and duplex ultrasound were performed to evaluate the lower extremity deep venous systems from the level of the common femoral vein and including the common femoral, femoral, profunda femoral, popliteal and calf veins including the posterior tibial, peroneal and gastrocnemius veins when visible. The superficial great saphenous vein was also interrogated. Spectral Doppler was utilized to evaluate flow at rest and with distal augmentation maneuvers in the common femoral, femoral and popliteal veins. COMPARISON:  None Available. FINDINGS: RIGHT LOWER EXTREMITY Common Femoral Vein: No evidence of thrombus. Normal compressibility, respiratory phasicity and response to augmentation. Saphenofemoral Junction: No evidence of thrombus. Normal compressibility and flow on color Doppler imaging. Profunda Femoral Vein: No evidence of thrombus. Normal compressibility and flow on color Doppler imaging. Femoral Vein: No evidence of thrombus. Normal compressibility, respiratory phasicity and response to augmentation. Popliteal Vein: No evidence of thrombus. Normal  compressibility, respiratory phasicity and response to augmentation. Calf Veins: No evidence of thrombus. Normal compressibility and flow on color Doppler imaging. Superficial Great Saphenous Vein: No evidence of thrombus. Normal compressibility. Other Findings:  None. LEFT LOWER EXTREMITY Common Femoral Vein: No evidence of thrombus. Normal compressibility, respiratory phasicity and response to augmentation. Saphenofemoral Junction: No evidence of thrombus. Normal compressibility and flow on color Doppler imaging. Profunda Femoral Vein: No evidence of thrombus. Normal compressibility and flow on color Doppler imaging. Femoral Vein: No evidence of thrombus. Normal compressibility, respiratory phasicity and response to augmentation. Popliteal Vein: No evidence of thrombus. Normal compressibility, respiratory phasicity and response to augmentation. Calf Veins: No evidence of thrombus. Normal compressibility and flow on color Doppler imaging. Superficial Great Saphenous Vein: No evidence of thrombus. Normal compressibility. Other Findings:  None. IMPRESSION: Negative for deep venous thrombosis within both legs. Electronically Signed   By: Rogelia Myers M.D.   On: 07/11/2024 16:36   DG Chest Portable 1 View Result Date: 07/11/2024 EXAM: 1 VIEW(S) XRAY OF THE CHEST 07/11/2024 03:22:00 PM COMPARISON: 01/25/2024 CLINICAL HISTORY: shortness of breath on exertion, history of CHF FINDINGS: LINES, TUBES AND DEVICES: Cardiac monitor over left chest. LUNGS AND PLEURA: Low lung volumes.  No focal pulmonary opacity. No pulmonary edema. No pleural effusion. No pneumothorax. HEART AND MEDIASTINUM: No acute abnormality of the cardiac and mediastinal silhouettes. BONES AND SOFT TISSUES: No acute osseous abnormality. Paucity of bowel gas in visible abdomen. IMPRESSION: 1. No acute cardiopulmonary process identified. 2. Low lung volumes Electronically signed by: Waddell Calk MD 07/11/2024 03:57 PM EDT RP Workstation: [id number]     Microbiology: Recent Results (from the past 240 hours)  Gram stain     Status: None   Collection Time: 07/12/24 10:31 AM   Specimen: Abdomen; Peritoneal Fluid  Result Value Ref Range Status   Specimen Description PERITONEAL  Final   Special Requests ABDOMEN  Final   Gram Stain   Final    NO WBC SEEN NO ORGANISMS SEEN Performed at [facility], [address] [place], [place] [address]    Report Status 07/12/2024 FINAL  Final     Labs: Basic Metabolic Panel: Recent Labs  Lab 07/11/24 1243 07/12/24 0242 07/13/24 0215 07/14/24 0226 07/15/24 0242 07/16/24 0141 07/17/24 0150  NA 144 143 142 141 138 142 140  K 3.2* 3.5 3.5 3.4* 3.8 4.0 3.7  CL 111 114* 110 108 103 101 102  CO2 22 20* 24 25 24 28 29   GLUCOSE 117* 115* 92 87 92 131* 90  BUN 16 15 16 16 18 23  28*  CREATININE 1.22* 1.38* 1.49* 1.53* 1.45* 1.41* 1.77*  CALCIUM  8.5* 8.3* 7.9* 7.8* 8.3* 8.8* 8.1*  MG 1.6* 1.9  --   --   --   --   --    Liver Function Tests: Recent Labs  Lab 07/11/24 1243 07/14/24 0226  AST 90* 57*  ALT 52* 31  ALKPHOS 67 37*  BILITOT 0.4 0.5  PROT 4.7* 4.0*  ALBUMIN 2.6* 2.4*   Recent Labs  Lab 07/11/24 1243  LIPASE 36   No results for input(s): AMMONIA in the last 168 hours. CBC: Recent Labs  Lab 07/11/24 1243 07/12/24 0242 07/16/24 0141  WBC 6.3 7.6 7.2  NEUTROABS  --  5.0  --   HGB 10.7* 12.4 11.5*  HCT 30.2* 35.9* 34.5*  MCV 92.9 94.0 95.6  PLT 151 217 211   Cardiac Enzymes: No results for input(s): CKTOTAL, CKMB, CKMBINDEX, TROPONINI in the last 168 hours. BNP: BNP (last 3 results) Recent Labs    01/25/24 1805  BNP 384.2*    ProBNP (last 3 results) Recent Labs    07/11/24 1243  PROBNP 4,021.0*    CBG: Recent Labs  Lab 07/16/24 1541 07/16/24 2130 07/17/24 0028 07/17/24 0626 07/17/24 1113  GLUCAP 96 117* 109* 82 89       Signed:  Sigurd Pac MD.  [facility] 07/17/2024, 11:37 AM
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
## 2024-08-14 ENCOUNTER — Ambulatory Visit (INDEPENDENT_AMBULATORY_CARE_PROVIDER_SITE_OTHER): Admitting: Neurology

## 2024-08-14 ENCOUNTER — Encounter: Payer: Self-pay | Admitting: Neurology

## 2024-08-14 VITALS — BP 150/84 | HR 73 | Resp 20 | Ht 59.0 in | Wt 168.0 lb

## 2024-08-14 DIAGNOSIS — G43009 Migraine without aura, not intractable, without status migrainosus: Secondary | ICD-10-CM

## 2024-08-14 MED ORDER — QULIPTA 60 MG PO TABS
60.0000 mg | ORAL_TABLET | Freq: Every day | ORAL | 5 refills | Status: AC
Start: 2024-08-14 — End: ?

## 2024-08-14 NOTE — Patient Instructions (Signed)
 Start Qulipta  60mg  daily.  If no improvement in 3 months, contact me. Take Ubrelvy at earliest onset of migraine.  May repeat in 2 hours if needed.  Maximum 2 tablets in 24 hours.

## 2024-08-25 ENCOUNTER — Encounter: Payer: Self-pay | Admitting: Physician Assistant

## 2024-08-25 ENCOUNTER — Telehealth: Payer: Self-pay | Admitting: Pediatrics

## 2024-08-25 NOTE — Telephone Encounter (Signed)
 Dr. Suzann,  We received an urgent referral for this patient for cirrhosis.  Patient last saw [facility] in 2024 (records are in [facility]).  Patient is wanting to transfer back to [place].  Please review and advise urgency and transfer of care?  Thanks [employer]  DOD 12/5/25AM

## 2024-08-31 ENCOUNTER — Other Ambulatory Visit (HOSPITAL_COMMUNITY): Payer: Self-pay

## 2024-10-05 ENCOUNTER — Ambulatory Visit (INDEPENDENT_AMBULATORY_CARE_PROVIDER_SITE_OTHER)
Admission: RE | Admit: 2024-10-05 | Discharge: 2024-10-05 | Disposition: A | Discharge status: Home / Self Care | Source: Ambulatory Visit | Attending: Physician Assistant | Admitting: Physician Assistant

## 2024-10-05 ENCOUNTER — Ambulatory Visit: Admitting: Physician Assistant

## 2024-10-05 ENCOUNTER — Other Ambulatory Visit

## 2024-10-05 ENCOUNTER — Encounter: Payer: Self-pay | Admitting: Physician Assistant

## 2024-10-05 VITALS — BP 144/84 | HR 60 | Ht 59.0 in | Wt 183.2 lb

## 2024-10-05 DIAGNOSIS — K921 Melena: Secondary | ICD-10-CM

## 2024-10-05 DIAGNOSIS — D649 Anemia, unspecified: Secondary | ICD-10-CM

## 2024-10-05 DIAGNOSIS — R197 Diarrhea, unspecified: Secondary | ICD-10-CM

## 2024-10-05 DIAGNOSIS — K7581 Nonalcoholic steatohepatitis (NASH): Secondary | ICD-10-CM

## 2024-10-05 DIAGNOSIS — R0609 Other forms of dyspnea: Secondary | ICD-10-CM

## 2024-10-05 DIAGNOSIS — R188 Other ascites: Secondary | ICD-10-CM | POA: Diagnosis not present

## 2024-10-05 DIAGNOSIS — K219 Gastro-esophageal reflux disease without esophagitis: Secondary | ICD-10-CM | POA: Diagnosis not present

## 2024-10-05 DIAGNOSIS — K7460 Unspecified cirrhosis of liver: Secondary | ICD-10-CM

## 2024-10-05 DIAGNOSIS — Z8719 Personal history of other diseases of the digestive system: Secondary | ICD-10-CM

## 2024-10-05 DIAGNOSIS — K589 Irritable bowel syndrome without diarrhea: Secondary | ICD-10-CM

## 2024-10-05 DIAGNOSIS — B182 Chronic viral hepatitis C: Secondary | ICD-10-CM

## 2024-10-05 DIAGNOSIS — R8290 Unspecified abnormal findings in urine: Secondary | ICD-10-CM

## 2024-10-05 LAB — IBC + FERRITIN
Ferritin: 121.1 ng/mL (ref 10.0–291.0)
Iron: 84 ug/dL (ref 42–145)
Saturation Ratios: 23 % (ref 20.0–50.0)
TIBC: 365.4 ug/dL (ref 250.0–450.0)
Transferrin: 261 mg/dL (ref 212.0–360.0)

## 2024-10-05 LAB — HEPATIC FUNCTION PANEL
ALT: 60 U/L — ABNORMAL HIGH (ref 3–35)
AST: 78 U/L — ABNORMAL HIGH (ref 5–37)
Albumin: 2.7 g/dL — ABNORMAL LOW (ref 3.5–5.2)
Alkaline Phosphatase: 66 U/L (ref 39–117)
Bilirubin, Direct: 0 mg/dL — ABNORMAL LOW (ref 0.1–0.3)
Total Bilirubin: 0.4 mg/dL (ref 0.2–1.2)
Total Protein: 5.1 g/dL — ABNORMAL LOW (ref 6.0–8.3)

## 2024-10-05 LAB — GAMMA GT: GGT: 32 U/L (ref 7–51)

## 2024-10-05 LAB — BASIC METABOLIC PANEL WITH GFR
BUN: 20 mg/dL (ref 6–23)
CO2: 26 meq/L (ref 19–32)
Calcium: 8 mg/dL — ABNORMAL LOW (ref 8.4–10.5)
Chloride: 111 meq/L (ref 96–112)
Creatinine, Ser: 1.15 mg/dL (ref 0.40–1.20)
GFR: 49.85 mL/min — ABNORMAL LOW
Glucose, Bld: 88 mg/dL (ref 70–99)
Potassium: 3.5 meq/L (ref 3.5–5.1)
Sodium: 143 meq/L (ref 135–145)

## 2024-10-05 LAB — CBC WITH DIFFERENTIAL/PLATELET
Basophils Absolute: 0.1 K/uL (ref 0.0–0.1)
Basophils Relative: 0.8 % (ref 0.0–3.0)
Eosinophils Absolute: 0.2 K/uL (ref 0.0–0.7)
Eosinophils Relative: 2.2 % (ref 0.0–5.0)
HCT: 30.2 % — ABNORMAL LOW (ref 36.0–46.0)
Hemoglobin: 10.7 g/dL — ABNORMAL LOW (ref 12.0–15.0)
Lymphocytes Relative: 26.6 % (ref 12.0–46.0)
Lymphs Abs: 1.8 K/uL (ref 0.7–4.0)
MCHC: 35.5 g/dL (ref 30.0–36.0)
MCV: 93.8 fl (ref 78.0–100.0)
Monocytes Absolute: 0.4 K/uL (ref 0.1–1.0)
Monocytes Relative: 6.2 % (ref 3.0–12.0)
Neutro Abs: 4.4 K/uL (ref 1.4–7.7)
Neutrophils Relative %: 64.2 % (ref 43.0–77.0)
Platelets: 140 K/uL — ABNORMAL LOW (ref 150.0–400.0)
RBC: 3.23 Mil/uL — ABNORMAL LOW (ref 3.87–5.11)
RDW: 15.7 % — ABNORMAL HIGH (ref 11.5–15.5)
WBC: 6.9 K/uL (ref 4.0–10.5)

## 2024-10-05 LAB — PROTIME-INR
INR: 1.1 ratio — ABNORMAL HIGH (ref 0.8–1.0)
Prothrombin Time: 11.5 s (ref 9.6–13.1)

## 2024-10-05 MED ORDER — DICYCLOMINE HCL 20 MG PO TABS: 20.0000 mg | ORAL_TABLET | Freq: Three times a day (TID) | ORAL | 1 refills | Status: AC | PRN

## 2024-10-05 MED ORDER — PANTOPRAZOLE SODIUM 40 MG PO TBEC: 40.0000 mg | DELAYED_RELEASE_TABLET | Freq: Every day | ORAL | 3 refills | Status: AC

## 2024-10-05 NOTE — Patient Instructions (Addendum)
 _______________________________________________________  If your blood pressure at your visit was 140/90 or greater, please contact your primary care physician to follow up on this.  _______________________________________________________  If you are age 66 or older, your body mass index should be between 23-30. Your Body mass index is 37.01 kg/m. If this is out of the aforementioned range listed, please consider follow up with your Primary Care Provider.  If you are age 66 or older, your body mass index should be between 19-25. Your Body mass index is 37.01 kg/m. If this is out of the aformentioned range listed, please consider follow up with your Primary Care Provider.   ________________________________________________________  The [facility] providers would like to encourage you to use MYCHART to communicate with providers for non-urgent requests or questions.  Due to long hold times on the telephone, sending your provider a message by [facility] may be a faster and more efficient way to get a response.  Please allow 48 business hours for a response.  Please remember that this is for non-urgent requests.  _______________________________________________________  [facility] is using a team-based approach to care.  Your team is made up of your doctor and two to three APPS. Our APPS (Nurse Practitioners and Physician Assistants) work with your physician to ensure care continuity for you. They are fully qualified to address your health concerns and develop a treatment plan. They communicate directly with your gastroenterologist to care for you. Seeing the Advanced Practice Practitioners on your physician's team can help you by facilitating care more promptly, often allowing for earlier appointments, access to diagnostic testing, procedures, and other specialty referrals.   Your provider has requested that you go to the basement level for lab work before leaving today. Press B on the  elevator. The lab is located at the first door on the left as you exit the elevator.  Your provider has requested that you have an abdominal x ray before leaving today. Please go to the basement floor to our Radiology department for the test.  We have sent the following medications to your pharmacy for you to pick up at your convenience:  START: pantoprazole  40mg  one tablet daily START: dicyclomine  20mg  one tablet three times daily as needed.  You have been scheduled for an abdominal paracentesis at [facility] (1st floor of hospital--ENT A) on 10-10-24 at 9am. Please arrive at least 30 minutes prior to your appointment time for registration. Should you need to reschedule this appointment for any reason, please call our office at [phone].  Due to recent changes in healthcare laws, you may see the results of your imaging and laboratory studies on MyChart before your provider has had a chance to review them.  We understand that in some cases there may be results that are confusing or concerning to you. Not all laboratory results come back in the same time frame and the provider may be waiting for multiple results in order to interpret others.  Please give us  48 hours in order for your provider to thoroughly review all the results before contacting the office for clarification of your results.   You should have an imaging study every 6 months to monitor for the development of hepatocellular carcinoma (liver cancer). The risk is low, but, if liver cancer is diagnosed early, there are better treatment options. Will get AFP, INR, CBC, and CMET.  Will follow up in 6 months to check labs and evaluate.   I recommend a high-protein, primarily plant-based diet. Avoid red  meat. Work to maintain a health weight. Weigh yourself daily- call if you have weight gain of greater than 5 lbs in a 1-2 days, leg swelling, or new swelling in your abdomen. Minimize salt intake- VERY important. Please  do not consume more than 2000 mg of sodium every day. Monitor your blood pressure at home.  Stay active. Weight-based exercise for 30 minutes at least 3 days a week is recommended. I recommend that you not drink any alcohol including beer, wine, liquor, and non-alcoholic beer.   You are at increased risk of osteopenia and osteoporosis. You should be screened for these metabolic bone diseases if you have not already had the testing performed.  Cirrhosis Cirrhosis is long-term (chronic) liver injury. The liver is the body's largest internal organ, and it performs many functions. It converts food into energy, removes toxic material from the blood, makes important proteins, and absorbs necessary vitamins from food. In cirrhosis, healthy liver cells are replaced by scar tissue. This prevents blood from flowing through the liver and makes it difficult for the liver to complete its functions. What are the causes? Common causes of this condition are hepatitis C and long-term alcohol abuse. Other causes include: Nonalcoholic fatty liver disease (NAFLD). This happens when fat is deposited in the liver by causes other than alcohol. Hepatitis B infection. Autoimmune hepatitis. In this condition, the body's defense system (immune system) mistakenly attacks the liver cells, causing inflammation. Diseases that cause blockage of ducts inside the liver. Inherited liver diseases, such as hemochromatosis. This is one of the most common inherited liver diseases. In this disease, deposits of iron collect in the liver and other organs. Reactions to certain long-term medicines, such as amiodarone, a heart medicine. Parasitic infections. These include schistosomiasis, which is caused by a flatworm. Long-term contact to certain toxins. These toxins include certain organic solvents, such as toluene and chloroform. What increases the risk? You are more likely to develop this condition if: You have certain types of viral  hepatitis. You abuse alcohol, especially if you are female. You are overweight. You use IV drugs and share needles. You have unprotected sex with someone who has viral hepatitis. What are the signs or symptoms? You may not have any signs and symptoms at first. Symptoms may not develop until the damage to your liver starts to get worse. Early symptoms may include: Weakness and tiredness (fatigue). Changes in sleep patterns or having trouble sleeping. Itchiness. Tenderness in the right-upper part of your abdomen. Weight loss and muscle loss. Nausea. Loss of appetite. Later symptoms may include: Fatigue or weakness that is getting worse. Yellow skin and eyes (jaundice). Buildup of fluid in the abdomen (ascites). You may notice that your clothes are tight around your waist. Weight gain and swelling of the feet and ankles (edema). Trouble breathing. Easy bruising and bleeding. Vomiting blood, or black or bloody stool. Mental confusion. How is this diagnosed? Your health care provider may suspect cirrhosis based on your symptoms and medical history, especially if you have other medical conditions or a history of alcohol abuse. Your health care provider will do a physical exam to feel your liver and to check for signs of cirrhosis. Tests may include: Blood tests to check: For hepatitis B or C. Kidney function. Liver function. Imaging tests such as: MRI or CT scan to look for changes seen in advanced cirrhosis. Ultrasound to see if normal liver tissue is being replaced by scar tissue. A procedure in which a long needle is used to  take a sample of liver tissue to be checked in a lab (biopsy). Liver biopsy can confirm the diagnosis of cirrhosis. How is this treated? Treatment for this condition depends on how damaged your liver is and what caused the damage. It may include treating the symptoms of cirrhosis, or treating the underlying causes to slow the damage. Treatment may include: Making  lifestyle changes, such as: Eating a healthy diet. You may need to work with your health care provider or a dietitian to develop an eating plan. Restricting salt intake. Maintaining a healthy weight. Not abusing drugs or alcohol. Taking medicines to: Treat liver infections or other infections. Control itching. Reduce fluid buildup. Reduce certain blood toxins. Reduce risk of bleeding from enlarged blood vessels in the stomach or esophagus (varices). Liver transplant. In this procedure, a liver from a donor is used to replace your diseased liver. This is done if cirrhosis has caused liver failure. Other treatments and procedures may be done depending on the problems that you get from cirrhosis. Common problems include liver-related kidney failure (hepatorenal syndrome). Follow these instructions at home:  Take medicines only as told by your health care provider. Do not use medicines that are toxic to your liver. Ask your health care provider before taking any new medicines, including over-the-counter medicines such as NSAIDs. Rest as needed. Eat a well-balanced diet. Limit your salt or water intake, if your health care provider asks you to do this. Do not drink alcohol. This is especially important if you routinely take acetaminophen . Keep all follow-up visits. This is important. Contact a health care provider if you: Have fatigue or weakness that is getting worse. Develop swelling of the hands, feet, or legs, or a buildup of fluid in the abdomen (ascites). Have a fever or chills. Develop loss of appetite. Have nausea or vomiting. Develop jaundice. Develop easy bruising or bleeding. Get help right away if you: Vomit bright red blood or a material that looks like coffee grounds. Have blood in your stools. Notice that your stools appear black and tarry. Become confused. Have chest pain or trouble breathing. These symptoms may represent a serious problem that is an emergency. Do not  wait to see if the symptoms will go away. Get medical help right away. Call your local emergency services (911 in the [place] Do not drive yourself to the hospital. Summary Cirrhosis is chronic liver injury. Common causes are hepatitis C and long-term alcohol abuse. Tests used to diagnose cirrhosis include blood tests, imaging tests, and liver biopsy. Treatment for this condition involves treating the underlying cause. Avoid alcohol, drugs, salt, and medicines that may damage your liver. Get help right away if you vomit bright red blood or a material that looks like coffee grounds. This information is not intended to replace advice given to you by your health care provider. Make sure you discuss any questions you have with your health care provider. Document Revised: 06/20/2020 Document Reviewed: 06/20/2020 Elsevier Patient Education  2022 [employer].

## 2024-10-05 NOTE — Progress Notes (Addendum)
 "    10/05/2024 Susan Hogan [id number] [birth year]  Referring provider: Samie Frederick, PA-C Primary GI doctor: Susan Hogan  ASSESSMENT AND PLAN:  Cirrhosis secondary to hepatitis C and associated MASH Serological workup November 2025 negative other than MM alpha 1 atnitrypsan, negative PETH Following with Susan [facility] at [facility], last seen 07/24/2024 History of ascites LVP October 2025, 4.5 L, negative SBP.culture  on furosemide  40 mg and spironolactone  25 mg, ? Need to switch Susan Hogan  to Susan Hogan if HR can tolerate it and increase spironolactone  No history of HE, none on exam today elevated AFP 17.3,07/16/2024 MRI showed varices gastric and esophageal, no liver masses PLAN: - continue following up with Susan Hogan  -continue to monitor for fluid ER precautions discussed with patient  -pending kidney function/electrolytes will consider increasing spironolactone  to 50 mg with goal being to 100 mg daily - Patient is currently on Susan Hogan  which can worsen lower extremity edema, will send note to primary care to see if they believe patient would tolerate switching Susan Hogan  to carvedilol twice daily - possible shifting dullness on percussion and increase in weight will schedule paracentesis at the [facility], give albumin  if more than 5 L removed -Low-sodium diet discussed -will schedule EGD for variceal screening - Recall Susan Hogan March 2025   melena/dark stools with normocytic anemia 07/24/2024 Hgb 10.4 MCV 97, platelets 227 07/12/2024 Ferritin 94, iron 49, saturation 13  Brown stool on exam in the office positive FOBT, posterior fissure PLAN - will treat anal fissure with diltiazem/lidocaine   -will recheck CBC, iron ferritin  -will likely benefit from EGD colonoscopy ( 2 day prep) with blood in the stool, anemia, variceal screening, and diarrhea however patient has anesthesia listed as a allergy , states this was from when Susan Hogan was here, will send note to Susan Hogan  to see if patient is appropriate  for [facility]  -needs cardiac clearance with DOE -if patient has any worsening shortness of breath with Blacks stools blood in the stool chest pain abominal pain to go to the ER  Loose stools x 1 year, previous history of IBS-C  Can have BM at least 3-4 x a day or up to every hour, can be oily stools if they are formed to watery stools, LLQ AB pain December 2017 colonoscopy unremarkable 06/15/2023 colonoscopy with poor prep Has seen dark red to black stools, oily stools, watery stools Brown stool on exam PLAN  suspect IBS versus bile acid diarrhea versus other  will get KUB  will consider colestipol for bile acid diarrhea  given trial of dicyclomine   GERD 05/2023 EGD negative variceal screening Takes tums PLAN you have to be with -Will add on PPI for possible gastritis -Repeat EGD for variceal screening/gastritis/+ FOBT -Lifestyle changes discussed, avoid NSAIDS, ETOH, hand out given to the patient -Schedule EGD at Susan [facility] or [facility], pending anesthsia guidelines to evaluate GERD, esophagitis, hiatal hernia,H pylori. I discussed risks of EGD with patient today, including risk of sedation, bleeding or perforation. Patient provides understanding and gave verbal consent to proceed.  CCS December 2017 colonoscopy unremarkable 06/15/2023 colonoscopy with poor prep needs 2 day prep Will get colonoscopy with 2 day prep for diarrhea/CCS We have discussed the risks of bleeding, infection, perforation, medication reactions, and remote risk of death associated with colonoscopy. All questions were answered and the patient acknowledges these risk and wishes to proceed.  DOE without chest pain Improved with inhaler, likely COPD/asthma CTA negative PE 04/09/2024 echo EF 67-69% mild RSVP pressure 38 mmHg unremarkable valves Pending CTA  but per patient this was not done Has done holter but has not turned it in Get cardiac clearance prior to endoscopic evaluation  Patient Care Team: Susan Hogan as PCP - General (Physician Assistant) Susan Juliene SAUNDERS, DO as Consulting Physician (Neurology)  HISTORY OF PRESENT ILLNESS: 64 y.o. female with a past medical history of COPD, hepatitis C cirrhosis, GERD, diabetes, arthritis and others listed below presents for evaluation of endoscopy.   Patient remotely seen by Susan Hogan in our office and is seen by digestive health, [facility], last seen 2024 Transferring care to our office after being seen inpatient for cirrhosis, CHF and  Anasarca 10/21 through 07/17/2024.  during hospitalization patient had paracentesis with 4.5 L removed no SBP started on Lasix  40 mg and Aldactone  25 mg, referred to [facility] and is here for follow-up.  Discussed the use of AI scribe software for clinical note transcription with the patient, who gave verbal consent to proceed.  History of Present Illness   Susan Hogan is a 64 year old female with cirrhosis and chronic hepatitis C who presents for evaluation of worsening abdominal distension.  Cirrhosis was diagnosed in October 2025 during a hospitalization, at which time paracentesis was performed for ascites. She recently underwent another paracentesis following a liver MRI, resulting in a weight decrease from 263 lbs to 165 lbs, but weight has since increased to 178 lbs. She perceives recurrent fluid accumulation, with increasing abdominal girth, tightness, and bloating, particularly in the left lower abdomen. Persistent pain and a bulging sensation are noted in the left lower abdomen, sometimes associated with a longstanding left ovarian cyst that has become increasingly symptomatic.  Chronic diarrhea has persisted for more than a year, with frequency ranging from 3-4 times daily to as often as every 15-20 minutes. Diarrhea is sometimes watery and sometimes associated with oily, shiny stools, particularly with formed bowel movements. Sharp abdominal pain occurs especially before defecation and persists until stool  passage. Most foods and fluids, including water, can trigger diarrhea. She wears a pad due to occasional fecal incontinence. No current constipation, though Linzess was used in 2024 for prior constipation.  Recurrent gastrointestinal bleeding is reported, with both bright red and black stools. Blood in the stool occurs intermittently, sometimes accompanied by sharp pain. Chills and episodes of feeling cold are noted. No yellowing of the eyes or skin.  History of esophageal narrowing and dysphagia, previously requiring esophageal dilation, most recently 2-3 years ago. Intermittent difficulty swallowing persists, particularly during changes in weather. Frequent heartburn, reflux, and excessive gas are present. Tums is used as needed for heartburn and reflux symptoms.  Amlodipine  10 mg is taken as needed for elevated blood pressure. She is on Lasix  40 mg daily and spironolactone  25 mg daily. Persistent lower extremity swelling has not improved, requiring modification of socks due to tightness and swelling.  Asthma is managed with an inhaler for intermittent shortness of breath, which improves with inhaler use. No chest pain. Ear pain and intermittent hearing difficulty are also reported.  History of urinary tract infections. Currently notes a strong odor to both stool and urine. No current tobacco or alcohol use, though she drank alcohol when younger. Hepatitis is attributed to tattoos received with unclean needles in the past. Sleep apnea is present. She has used a Holter monitor, which has not yet been returned. No recent stress test due to hospitalization and missed appointments.      She  reports that she has never smoked. She has  never been exposed to tobacco smoke. She has never used smokeless tobacco. She reports that she does not drink alcohol and does not use drugs.  RELEVANT GI HISTORY, IMAGING AND LABS: Results   Digital rectal examination No external hemorrhoids or fissures. Internal  hemorrhoid visualized. Mild tenderness. Posterior anal fissure present. Brown stool in rectal vault. No obvious blood visualized. Fecal occult blood test positive for blood.  Fecal occult blood test (hemoccult) Stool sample tested positive for occult blood.      CBC    Component Value Date/Time   WBC 7.2 07/16/2024 0141   RBC 3.61 (L) 07/16/2024 0141   HGB 11.5 (L) 07/16/2024 0141   HCT 34.5 (L) 07/16/2024 0141   PLT 211 07/16/2024 0141   MCV 95.6 07/16/2024 0141   MCH 31.9 07/16/2024 0141   MCHC 33.3 07/16/2024 0141   RDW 14.4 07/16/2024 0141   LYMPHSABS 1.7 07/12/2024 0242   MONOABS 0.6 07/12/2024 0242   EOSABS 0.2 07/12/2024 0242   BASOSABS 0.1 07/12/2024 0242   Recent Labs    10/13/23 0645 01/25/24 1805 07/11/24 1243 07/12/24 0242 07/16/24 0141  HGB 13.7 11.3* 10.7* 12.4 11.5*    CMP     Component Value Date/Time   NA 140 07/17/2024 0150   K 3.7 07/17/2024 0150   CL 102 07/17/2024 0150   CO2 29 07/17/2024 0150   GLUCOSE 90 07/17/2024 0150   BUN 28 (H) 07/17/2024 0150   CREATININE 1.77 (H) 07/17/2024 0150   CREATININE 0.71 04/08/2018 1536   CALCIUM  8.1 (L) 07/17/2024 0150   PROT 4.0 (L) 07/14/2024 0226   ALBUMIN  2.4 (L) 07/14/2024 0226   AST 57 (H) 07/14/2024 0226   ALT 31 07/14/2024 0226   ALKPHOS 37 (L) 07/14/2024 0226   BILITOT 0.5 07/14/2024 0226   GFRNONAA 32 (L) 07/17/2024 0150   GFRAA >60 02/19/2020 1500      Latest Ref Rng & Units 07/14/2024    2:26 AM 07/11/2024   12:43 PM 01/25/2024    6:05 PM  Hepatic Function  Total Protein 6.5 - 8.1 g/dL 4.0  4.7  4.7   Albumin  3.5 - 5.0 g/dL 2.4  2.6  2.2   AST 15 - 41 U/L 57  90  96   ALT 0 - 44 U/L 31  52  59   Alk Phosphatase 38 - 126 U/L 37  67  53   Total Bilirubin 0.0 - 1.2 mg/dL 0.5  0.4  0.8   Bilirubin, Direct 0.0 - 0.2 mg/dL <9.8         Latest Ref Rng & Units 01/21/2012    3:45 PM 02/07/2015    4:39 PM 07/12/2024    2:35 PM  Hepatitis C  Hep C Genotype  1a  1a  Comment   HCV Quanitative  <15 IU/mL  200,914    HCV Quanitative Log <1.18 log 10  5.30      Current Medications:   Current Outpatient Medications (Cardiovascular):    amLODipine  (Susan [facility] ) 10 MG tablet, Take 1 tablet (10 mg total) by mouth daily.   furosemide  (LASIX ) 40 MG tablet, Take 1 tablet (40 mg total) by mouth daily.   spironolactone  (ALDACTONE ) 25 MG tablet, Take 1 tablet (25 mg total) by mouth daily.  Current Outpatient Medications (Respiratory):    albuterol  (PROVENTIL ) (2.5 MG/3ML) 0.083% nebulizer solution, Take 2.5 mg by nebulization every 6 (six) hours as needed for wheezing or shortness of breath.   albuterol  (VENTOLIN  HFA) 108 (90 Base) MCG/ACT  inhaler, Inhale 1-2 puffs into the lungs every 6 (six) hours as needed for wheezing or shortness of breath.   fluticasone  (FLONASE ) 50 MCG/ACT nasal spray, Place 1 spray into both nostrils daily as needed for allergies.  Current Outpatient Medications (Analgesics):    Atogepant  (QULIPTA ) 60 MG TABS, Take 1 tablet (60 mg total) by mouth daily.  Current Outpatient Medications (Other):    dicyclomine  (BENTYL ) 20 MG tablet, Take 1 tablet (20 mg total) by mouth 3 (three) times daily as needed for spasms.   methocarbamol  (ROBAXIN ) 500 MG tablet, Take 1 tablet (500 mg total) by mouth every 8 (eight) hours as needed for muscle spasms.   pantoprazole  (PROTONIX ) 40 MG tablet, Take 1 tablet (40 mg total) by mouth daily.  Medical History:  Past Medical History:  Diagnosis Date   Anemia    Arthritis    RA states does not see Rheumatologist   Asthma    Carpal tunnel syndrome    bilaterally   Complication of anesthesia    difficulty breathing   COPD (chronic obstructive pulmonary disease) (HCC)    COVID-19    Degenerative disk disease    back   Depression    Diabetes mellitus    Fibromyalgia    GERD (gastroesophageal reflux disease)    Headache(784.0)    migraines   Heart murmur    Hepatitis    In followup treatment for hepatitis C   Hypertension     no medication for at this time, sees Dr. Annetta Pouch Pcp   Hypotensive episode    hx of   Irritable bowel syndrome    Kidney stone    with hematuria   Memory loss    Migraines    Recurrent upper respiratory infection (URI)    Shortness of breath    Syncope    Allergies: Allergies[1]   Surgical History:  She  has a past surgical history that includes Tubal ligation; Dilation and curettage of uterus; Liver biopsy; Tonsillectomy; Cholecystectomy (2019); Left knee Hogan; and IR Paracentesis (07/12/2024). Family History:  Her family history includes Anemia in her mother; Asthma in her maternal grandmother and mother; Cerebral aneurysm in her mother; Colon cancer in her maternal uncle; Diabetes in her daughter, granddaughter, maternal grandmother, and mother; Heart Problems in her maternal grandmother and another family member; Heart disease in her maternal grandmother, maternal uncle, and mother; Kidney failure in her mother; Migraines in her mother; Parkinson's disease in her mother.  REVIEW OF SYSTEMS  : All other systems reviewed and negative except where noted in the History of Present Illness.  PHYSICAL EXAM: BP (!) 144/84   Pulse 60   Ht 4' 11 (1.499 m)   Wt 183 lb 4 oz (83.1 kg)   LMP  (LMP Unknown)   BMI 37.01 kg/m  Physical Exam   General :  Alert, well developed female in no acute distress Head:  Normocephalic and atraumatic. Eyes :  sclerae anicteric,conjunctive pink  Heart:  regular rate and rhythm Pulm:  Clear anteriorly; no wheezing Abdomen:   Distended, Obese AB, skin exam normal, Sluggish bowel sounds. moderate tenderness in the LLQ. Without guarding and Without rebound, without organomegaly. no  fluid wave, with shifting dullness.  Extremities:   With edema. Msk:  Symmetrical without gross deformities. Peripheral pulses intact.  Neurologic: Alert and  oriented x4;  grossly normal neurologically. without asterixis or clonus.  Skin:   without jaundice. no  palmar erythema or spider angioma.   Psychiatric:  Demonstrates good  judgement and reason without abnormal affect or behaviors. Fisher she has had a lot no external hemorrhoids or fissures, possible posterior rectal tear, stool present in rectum, brown in color, fecal occult blood positive.          Alan JONELLE Coombs, PA-C 3:38 PM      [1]  Allergies Allergen Reactions   Anesthetic Ether [Ether] Shortness Of Breath    IV anesthesia causes extreme pain and tightness in back and ribcage.   Nubain [Nalbuphine Hcl] Anaphylaxis   Penicillins Hives and Shortness Of Breath    DID THE REACTION INVOLVE: Swelling of the face/tongue/throat, SOB, or low BP? Yes Sudden or severe rash/hives, skin peeling, or the inside of the mouth or nose? Yes Did it require medical treatment? Yes When did it last happen? About 3 years ago    If all above answers are NO, may proceed with cephalosporin use.   Prednisone  Palpitations   Lisinopril Hives   Advair Diskus [Fluticasone -Salmeterol] Hives   Lactose Intolerance (Gi) Diarrhea   Tizanidine Palpitations and Other (See Comments)    made my right side numb   "

## 2024-10-06 ENCOUNTER — Ambulatory Visit: Payer: Self-pay | Admitting: Physician Assistant

## 2024-10-06 ENCOUNTER — Other Ambulatory Visit

## 2024-10-06 DIAGNOSIS — R188 Other ascites: Secondary | ICD-10-CM

## 2024-10-06 DIAGNOSIS — D649 Anemia, unspecified: Secondary | ICD-10-CM

## 2024-10-06 DIAGNOSIS — R197 Diarrhea, unspecified: Secondary | ICD-10-CM

## 2024-10-06 DIAGNOSIS — B182 Chronic viral hepatitis C: Secondary | ICD-10-CM

## 2024-10-07 LAB — TISSUE TRANSGLUTAMINASE, IGA: (tTG) Ab, IgA: <1 U/mL

## 2024-10-07 LAB — IGA: Immunoglobulin A: 92 mg/dL (ref 70–320)

## 2024-10-07 LAB — SEDIMENTATION RATE: Sed Rate: 33 mm/h — ABNORMAL HIGH (ref 0–30)

## 2024-10-09 ENCOUNTER — Other Ambulatory Visit: Payer: Self-pay | Admitting: Physician Assistant

## 2024-10-09 ENCOUNTER — Other Ambulatory Visit: Payer: Self-pay

## 2024-10-09 DIAGNOSIS — K7460 Unspecified cirrhosis of liver: Secondary | ICD-10-CM

## 2024-10-09 DIAGNOSIS — R188 Other ascites: Secondary | ICD-10-CM

## 2024-10-09 MED ORDER — SPIRONOLACTONE 50 MG PO TABS: 50.0000 mg | ORAL_TABLET | Freq: Every day | ORAL | 0 refills | Status: DC

## 2024-10-10 ENCOUNTER — Other Ambulatory Visit (HOSPITAL_COMMUNITY)

## 2024-10-10 LAB — FECAL FAT, QUALITATIVE
Fat Qual Neutral, Stl: NORMAL
Fat Qual Total, Stl: NORMAL

## 2024-10-11 LAB — CALPROTECTIN, FECAL: Calprotectin, Fecal: 8 ug/g (ref 0–120)

## 2024-10-11 NOTE — Progress Notes (Signed)
 Pt was contacted in regard to recommendations for pt to see Cardiology.  Pt was notified. Pt stated that she has an appointment to see her Primary Cardiologist Dr. Jennetta on 10/23/2024. Pt was notified to follow up with our office after office visit completed. Pt verbalized understanding with all questions answered.

## 2024-10-11 NOTE — Progress Notes (Signed)
 ____________________________________________________________  Attending physician addendum:  Thank you for sending this case to me. I have reviewed the entire note and agree with the plan.  Excellent comprehensive review in a complex patient.  It remains a significant challenge at this patient's care has been fragmented over multiple health systems and that she has changed GI practices again. She unquestionably needs comprehensive follow-up with her cardiologist and for us  to receive communications from them in order to know that she is optimally managed from a volume overload standpoint before any consideration of sedation for endoscopic procedures. My impression at the time of her inpatient consult was that her heart failure had as much more to do with her volume overload than the cirrhosis, and I will thus leave the management of her diuretics and volume status to her cardiovascular team and hepatologist.   Victory Brand, MD  ____________________________________________________________

## 2024-10-12 LAB — PANCREATIC ELASTASE, FECAL: Pancreatic Elastase-1, Stool: 475 ug/g

## 2025-03-19 ENCOUNTER — Ambulatory Visit: Admitting: Neurology
